# Patient Record
Sex: Female | Born: 1935
Health system: Southern US, Community
[De-identification: ages and names within clinical notes are randomized; demographics above are authoritative.]

## PROBLEM LIST (undated history)

## (undated) DIAGNOSIS — T7840XA Allergy, unspecified, initial encounter: Secondary | ICD-10-CM

## (undated) DIAGNOSIS — G629 Polyneuropathy, unspecified: Secondary | ICD-10-CM

## (undated) DIAGNOSIS — I10 Essential (primary) hypertension: Secondary | ICD-10-CM

## (undated) DIAGNOSIS — J189 Pneumonia, unspecified organism: Secondary | ICD-10-CM

## (undated) DIAGNOSIS — F329 Major depressive disorder, single episode, unspecified: Secondary | ICD-10-CM

## (undated) DIAGNOSIS — F32A Depression, unspecified: Secondary | ICD-10-CM

## (undated) DIAGNOSIS — I341 Nonrheumatic mitral (valve) prolapse: Secondary | ICD-10-CM

## (undated) DIAGNOSIS — K635 Polyp of colon: Secondary | ICD-10-CM

## (undated) DIAGNOSIS — Z8719 Personal history of other diseases of the digestive system: Secondary | ICD-10-CM

## (undated) DIAGNOSIS — M1990 Unspecified osteoarthritis, unspecified site: Secondary | ICD-10-CM

## (undated) DIAGNOSIS — K219 Gastro-esophageal reflux disease without esophagitis: Secondary | ICD-10-CM

## (undated) DIAGNOSIS — K5790 Diverticulosis of intestine, part unspecified, without perforation or abscess without bleeding: Secondary | ICD-10-CM

## (undated) DIAGNOSIS — E079 Disorder of thyroid, unspecified: Secondary | ICD-10-CM

## (undated) DIAGNOSIS — E785 Hyperlipidemia, unspecified: Secondary | ICD-10-CM

## (undated) DIAGNOSIS — M4800 Spinal stenosis, site unspecified: Secondary | ICD-10-CM

## (undated) HISTORY — DX: Depression, unspecified: F32.A

## (undated) HISTORY — DX: Allergy, unspecified, initial encounter: T78.40XA

## (undated) HISTORY — DX: Polyp of colon: K63.5

## (undated) HISTORY — DX: Spinal stenosis, site unspecified: M48.00

## (undated) HISTORY — DX: Hyperlipidemia, unspecified: E78.5

## (undated) HISTORY — PX: COLONOSCOPY: SHX174

## (undated) HISTORY — DX: Personal history of other diseases of the digestive system: Z87.19

## (undated) HISTORY — DX: Major depressive disorder, single episode, unspecified: F32.9

## (undated) HISTORY — PX: WISDOM TOOTH EXTRACTION: SHX21

## (undated) HISTORY — PX: TONSILLECTOMY: SUR1361

## (undated) HISTORY — PX: OTHER SURGICAL HISTORY: SHX169

## (undated) HISTORY — DX: Diverticulosis of intestine, part unspecified, without perforation or abscess without bleeding: K57.90

## (undated) HISTORY — PX: SHOULDER SURGERY: SHX246

## (undated) HISTORY — DX: Unspecified osteoarthritis, unspecified site: M19.90

## (undated) HISTORY — PX: EYE SURGERY: SHX253

## (undated) HISTORY — DX: Nonrheumatic mitral (valve) prolapse: I34.1

## (undated) HISTORY — DX: Disorder of thyroid, unspecified: E07.9

## (undated) HISTORY — PX: CATARACT EXTRACTION: SUR2

## (undated) HISTORY — PX: BACK SURGERY: SHX140

## (undated) HISTORY — DX: Gastro-esophageal reflux disease without esophagitis: K21.9

## (undated) HISTORY — DX: Essential (primary) hypertension: I10

## (undated) HISTORY — PX: TUBAL LIGATION: SHX77

## (undated) HISTORY — PX: ABDOMINAL HYSTERECTOMY: SHX81

---

## 2003-08-13 LAB — HM COLONOSCOPY

## 2009-07-31 LAB — HM MAMMOGRAPHY

## 2010-05-04 ENCOUNTER — Ambulatory Visit: Payer: Self-pay | Admitting: Internal Medicine

## 2010-05-04 DIAGNOSIS — E785 Hyperlipidemia, unspecified: Secondary | ICD-10-CM | POA: Insufficient documentation

## 2010-05-04 DIAGNOSIS — J45909 Unspecified asthma, uncomplicated: Secondary | ICD-10-CM | POA: Insufficient documentation

## 2010-05-04 DIAGNOSIS — I10 Essential (primary) hypertension: Secondary | ICD-10-CM

## 2010-05-04 DIAGNOSIS — J309 Allergic rhinitis, unspecified: Secondary | ICD-10-CM | POA: Insufficient documentation

## 2010-05-04 DIAGNOSIS — K5730 Diverticulosis of large intestine without perforation or abscess without bleeding: Secondary | ICD-10-CM | POA: Insufficient documentation

## 2010-05-04 DIAGNOSIS — Z8679 Personal history of other diseases of the circulatory system: Secondary | ICD-10-CM | POA: Insufficient documentation

## 2010-05-04 DIAGNOSIS — I499 Cardiac arrhythmia, unspecified: Secondary | ICD-10-CM | POA: Insufficient documentation

## 2010-05-04 DIAGNOSIS — M1990 Unspecified osteoarthritis, unspecified site: Secondary | ICD-10-CM

## 2010-05-04 DIAGNOSIS — D126 Benign neoplasm of colon, unspecified: Secondary | ICD-10-CM | POA: Insufficient documentation

## 2010-05-04 DIAGNOSIS — K219 Gastro-esophageal reflux disease without esophagitis: Secondary | ICD-10-CM | POA: Insufficient documentation

## 2010-05-04 DIAGNOSIS — E039 Hypothyroidism, unspecified: Secondary | ICD-10-CM

## 2010-05-04 DIAGNOSIS — Z8719 Personal history of other diseases of the digestive system: Secondary | ICD-10-CM | POA: Insufficient documentation

## 2010-05-04 HISTORY — DX: Personal history of other diseases of the digestive system: Z87.19

## 2010-05-04 HISTORY — DX: Essential (primary) hypertension: I10

## 2010-05-04 HISTORY — DX: Allergic rhinitis, unspecified: J30.9

## 2010-05-04 HISTORY — DX: Gastro-esophageal reflux disease without esophagitis: K21.9

## 2010-05-04 HISTORY — DX: Personal history of other diseases of the circulatory system: Z86.79

## 2010-05-04 HISTORY — DX: Cardiac arrhythmia, unspecified: I49.9

## 2010-05-04 HISTORY — DX: Hyperlipidemia, unspecified: E78.5

## 2010-05-04 HISTORY — DX: Unspecified osteoarthritis, unspecified site: M19.90

## 2010-05-04 HISTORY — DX: Diverticulosis of large intestine without perforation or abscess without bleeding: K57.30

## 2010-05-04 HISTORY — DX: Benign neoplasm of colon, unspecified: D12.6

## 2010-05-04 HISTORY — DX: Hypothyroidism, unspecified: E03.9

## 2010-06-29 NOTE — Assessment & Plan Note (Signed)
Summary: NEW PT EST / OK PER DR Lovell Sheehan // RS   Vital Signs:  Patient profile:   77 year old female Height:      64 inches Weight:      126 pounds BMI:     21.71 Temp:     98.2 degrees F oral Pulse rate:   72 / minute Resp:     14 per minute BP sitting:   140 / 80  (left arm)  Vitals Entered By: Willy Eddy, LPN (May 04, 2010 9:24 AM) CC: new pt to establish, Hypertension Management Is Patient Diabetic? No   Primary Care Provider:  Stacie Glaze MD  CC:  new pt to establish and Hypertension Management.  History of Present Illness: Hx of white coat hypertension keeps blood pressure records at home and the last five days has averaged 128/70 with pulse in the 60"s no chest pain or SOB Hx of GERD without symptoms on prilosec 40 ( was on nexium) Has been on metoprolol hx of  MVP ( symptomatic) but echo and cardiolyte were normal with no significant regurgitation has been stable on BB never did dental prophilaxis  Hypertension History:      She denies headache, chest pain, palpitations, dyspnea with exertion, orthopnea, PND, peripheral edema, visual symptoms, neurologic problems, syncope, and side effects from treatment.  measured blood pressure at home.        Positive major cardiovascular risk factors include female age 11 years old or older, hyperlipidemia, and hypertension.  Negative major cardiovascular risk factors include non-tobacco-user status.     Preventive Screening-Counseling & Management  Alcohol-Tobacco     Smoking Status: never     Tobacco Counseling: not indicated; no tobacco use  Caffeine-Diet-Exercise     Does Patient Exercise: yes      Drug Use:  no.    Problems Prior to Update: 1)  Hypothyroidism  (ICD-244.9) 2)  Cardiac Arrhythmia  (ICD-427.9) 3)  Colonic Polyps  (ICD-211.3) 4)  Hypertension  (ICD-401.9) 5)  Hyperlipidemia  (ICD-272.4) 6)  Osteoarthritis  (ICD-715.90) 7)  Gerd  (ICD-530.81) 8)  Diverticulosis, Colon   (ICD-562.10) 9)  Diverticulitis, Hx of  (ICD-V12.79) 10)  Asthma  (ICD-493.90) 11)  Allergic Rhinitis  (ICD-477.9)  Medications Prior to Update: 1)  None  Current Medications (verified): 1)  Levothyroxine Sodium 25 Mcg Tabs (Levothyroxine Sodium) .Marland Kitchen.. 1 Once Daily 2)  Omeprazole 40 Mg Cpdr (Omeprazole) .Marland Kitchen.. 1 Once Daily 3)  Ventolin Hfa 108 (90 Base) Mcg/act Aers (Albuterol Sulfate) .... As Needed 4)  Metoprolol Tartrate 25 Mg Tabs (Metoprolol Tartrate) .Marland Kitchen.. 1 Three Times A Day 5)  Ra Krill Oil 500 Mg Caps (Krill Oil) .... One By Mouth Two Times A Day  Allergies (verified): 1)  ! 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  Past Medical History: Allergic rhinitis Asthma Diverticulitis, hx of Diverticulosis, colon GERD Osteoarthritis Hyperlipidemia Hypertension Hypothyroidism  Past Surgical History: thrigger thumb 2011 Hysterectomy 2010 for fibroids Tonsillectomy as a child  Family History: Reviewed history and no changes required. Family History Ovarian cancer heart disease Family History of Stroke F  1st degree relative <60  Social History: Reviewed history and no changes required. 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 Smoked Alcohol use-yes Drug use-no Regular exercise-yes Smoking Status:  never Drug Use:  no Does Patient Exercise:  yes  Review of Systems  The patient denies anorexia, fever, weight loss, weight gain, vision loss, decreased hearing, hoarseness, chest  pain, syncope, dyspnea on exertion, peripheral edema, prolonged cough, headaches, hemoptysis, abdominal pain, melena, hematochezia, severe indigestion/heartburn, hematuria, incontinence, genital sores, muscle weakness, suspicious skin lesions, transient blindness, difficulty walking, depression, unusual weight change, abnormal bleeding, enlarged lymph nodes, angioedema, and breast masses.    Physical Exam  General:  alert and well-developed.   Head:  normocephalic and atraumatic.   Eyes:  pupils equal and pupils round.   Ears:  R ear normal and L ear normal.   Nose:  no external deformity and no nasal discharge.   Lungs:  normal respiratory effort and no wheezes.   Heart:  normal rate, regular rhythm, and systolic click.   Abdomen:  soft and non-tender.   Msk:  normal ROM and no joint tenderness.   Neurologic:  alert & oriented X3 and finger-to-nose normal.     Impression & Recommendations:  Problem # 1:  HYPOTHYROIDISM (ICD-244.9)  Her updated medication list for this problem includes:    Levothyroxine Sodium 25 Mcg Tabs (Levothyroxine sodium) .Marland Kitchen... 1 once daily  Orders: TLB-TSH (Thyroid Stimulating Hormone) (84443-TSH)  Problem # 2:  HYPERLIPIDEMIA (ICD-272.4) one diet and fish oil discussion of kril oil as an alternative she is not compliant with fish oil Orders: TLB-Cholesterol, Direct LDL (83721-DIRLDL) TLB-Cholesterol, HDL (83718-HDL) TLB-Cholesterol, Total (82465-CHO) Venipuncture (16109)  10 Yr Risk Heart Disease: Not enough information  Problem # 3:  HYPERTENSION (ICD-401.9) brings a few days work of reading fro control Her updated medication list for this problem includes:    Metoprolol Tartrate 25 Mg Tabs (Metoprolol tartrate) .Marland Kitchen... 1 three times a day  BP today: 140/80  10 Yr Risk Heart Disease: Not enough information  Problem # 4:  GERD (ICD-530.81) stable Her updated medication list for this problem includes:    Omeprazole 40 Mg Cpdr (Omeprazole) .Marland Kitchen...  1 once daily  Problem # 5:  ASTHMA (ICD-493.90)  Her updated medication list for this problem includes:    Ventolin Hfa 108 (90 Base) Mcg/act Aers (Albuterol sulfate) .Marland Kitchen... As needed  Problem # 6:  COLONIC POLYPS (ICD-211.3) colon in 2005   Complete Medication List: 1)  Levothyroxine Sodium 25 Mcg Tabs (Levothyroxine sodium) .Marland Kitchen.. 1 once daily 2)  Omeprazole 40 Mg Cpdr (Omeprazole) .Marland Kitchen.. 1 once daily 3)  Ventolin Hfa 108 (90 Base) Mcg/act Aers (Albuterol sulfate) .... As needed 4)  Metoprolol Tartrate 25 Mg Tabs (Metoprolol tartrate) .Marland Kitchen.. 1 three times a day 5)  Ra Krill Oil 500 Mg Caps (Krill oil) .... One by mouth two times a day  Hypertension Assessment/Plan:      The patient's hypertensive risk group is category B: At least one risk factor (excluding diabetes) with no target organ damage.  Today's blood pressure is 140/80.  Her blood pressure goal is < 140/90.  Patient Instructions: 1)  Please schedule a follow-up appointment in 3-4 months. Prescriptions: RA KRILL OIL 500 MG CAPS (KRILL OIL) one by mouth two times a day  #60 x 2   Entered and Authorized by:   Stacie Glaze MD   Signed by:   Stacie Glaze MD on 05/04/2010   Method used:   Electronically to  [facility] (retail)       [address]       [place], [place]  [address]       Ph: [phone] or [phone]       Fax: [phone]   RxID:   [id number]    Orders Added: 1)  TLB-TSH (Thyroid Stimulating Hormone) [84443-TSH] 2)  TLB-Cholesterol, Direct LDL [83721-DIRLDL] 3)  TLB-Cholesterol, HDL [83718-HDL] 4)  TLB-Cholesterol, Total [82465-CHO] 5)  Venipuncture [64403] 6)  New Patient Level III [47425]   Immunization History:  Tetanus/Td Immunization History:    Tetanus/Td:  historical (03/10/2002)    Tetanus/Td:  td (03/10/2002)  Zostavax History:    Zostavax # 1:  zostavax (02/02/2005)  Influenza Immunization History:    Influenza:   historical (02/23/2010)  Pneumovax Immunization History:    Pneumovax:  historical (03/04/2003)   Immunization History:  Tetanus/Td Immunization History:    Tetanus/Td:  Historical (03/10/2002)  Zostavax History:    Zostavax # 1:  Zostavax (02/02/2005)  Influenza Immunization History:    Influenza:  Historical (02/23/2010)  Pneumovax Immunization History:    Pneumovax:  Historical (03/04/2003)   Preventive Care Screening  Mammogram:    Date:  07/31/2009    Next Due:  07/2010    Results:  normal   Pap Smear:    Date:  10/25/2008    Results:  normal   Colonoscopy:    Date:  08/13/2003    Results:  normal   Last Tetanus Booster:    Date:  03/10/2002    Results:  Td       Preventive Care Screening  Mammogram:    Date:  07/31/2009    Next Due:  07/2010    Results:  normal   Pap Smear:    Date:  10/25/2008    Results:  normal   Colonoscopy:    Date:  08/13/2003    Results:  normal   Last Tetanus Booster:    Date:  03/10/2002    Results:  Td   Appended Document: Orders Update    Clinical Lists Changes  Orders: Added new Service order of Specimen Handling ([id number]) - Signed

## 2010-08-17 ENCOUNTER — Encounter: Payer: Self-pay | Admitting: Internal Medicine

## 2010-08-18 ENCOUNTER — Ambulatory Visit (INDEPENDENT_AMBULATORY_CARE_PROVIDER_SITE_OTHER): Payer: Medicare Other | Admitting: Internal Medicine

## 2010-08-18 ENCOUNTER — Encounter: Payer: Self-pay | Admitting: Internal Medicine

## 2010-08-18 VITALS — BP 140/80 | HR 72 | Temp 97.4°F | Resp 14 | Ht 64.0 in | Wt 128.0 lb

## 2010-08-18 DIAGNOSIS — N959 Unspecified menopausal and perimenopausal disorder: Secondary | ICD-10-CM

## 2010-08-18 DIAGNOSIS — J4540 Moderate persistent asthma, uncomplicated: Secondary | ICD-10-CM | POA: Insufficient documentation

## 2010-08-18 DIAGNOSIS — J45990 Exercise induced bronchospasm: Secondary | ICD-10-CM

## 2010-08-18 DIAGNOSIS — M25551 Pain in right hip: Secondary | ICD-10-CM

## 2010-08-18 DIAGNOSIS — I10 Essential (primary) hypertension: Secondary | ICD-10-CM

## 2010-08-18 DIAGNOSIS — E039 Hypothyroidism, unspecified: Secondary | ICD-10-CM

## 2010-08-18 HISTORY — DX: Unspecified menopausal and perimenopausal disorder: N95.9

## 2010-08-18 HISTORY — DX: Exercise induced bronchospasm: J45.990

## 2010-08-18 MED ORDER — OMEPRAZOLE 40 MG PO CPDR: 40.0000 mg | DELAYED_RELEASE_CAPSULE | Freq: Every day | ORAL | Status: DC

## 2010-08-18 MED ORDER — ESTRADIOL 0.5 MG PO TABS: 0.5000 mg | ORAL_TABLET | Freq: Every day | ORAL | Status: DC

## 2010-08-18 MED ORDER — KRILL OIL 1000 MG PO CAPS: 1.0000 | ORAL_CAPSULE | Freq: Four times a day (QID) | ORAL | Status: AC

## 2010-08-18 NOTE — Assessment & Plan Note (Signed)
She is stable on her current dose of 25 mcg of Synthroid

## 2010-08-18 NOTE — Progress Notes (Signed)
  Subjective:    Patient ID: Sara Medina, female    DOB: 1929/09/10, 77 y.o.   MRN: [id number]  HPI   the patient is a healthy 77 year old white female who presents for followup  Hyperlipidemia hypertension and hypothyroidism she had blood work drawn prior to her visit for her TSH and her lipid panel she has been doing well. She brings a record of her home blood pressures which average 120/70 she has done this daily for several weeks prior to this visit to demonstrate an elevated blood pressures in the office or white coat syndrome not treated hypertension.   She's currently on red kril oill once a day  she is on an estrogen patch having had a hysterectomy 2 years ago 4 menopausal symptoms  Review of Systems  Constitutional: Negative for activity change, appetite change and fatigue.  HENT: Negative for ear pain, congestion, neck pain, postnasal drip and sinus pressure.   Eyes: Negative for redness and visual disturbance.  Respiratory: Negative for cough, shortness of breath and wheezing.   Gastrointestinal: Negative for abdominal pain and abdominal distention.  Genitourinary: Negative for dysuria, frequency and menstrual problem.  Musculoskeletal: Negative for myalgias, joint swelling and arthralgias.  Skin: Negative for rash and wound.  Neurological: Negative for dizziness, weakness and headaches.  Hematological: Negative for adenopathy. Does not bruise/bleed easily.  Psychiatric/Behavioral: Negative for sleep disturbance and decreased concentration.   Past Medical History  Diagnosis Date  . Allergy   . Asthma   . Diverticulosis   . History of diverticulitis of colon   . GERD (gastroesophageal reflux disease)   . Arthritis   . Hyperlipidemia   . Hypertension   . Thyroid disease    Past Surgical History  Procedure Date  . Trigger thumb   . Abdominal hysterectomy   . Tonsillectomy     reports that she has never smoked. She has never used smokeless tobacco. She reports that she  does not drink alcohol or use illicit drugs. family history includes Cancer in her mother and sister; Heart disease in her father; Ovarian cancer in an unspecified family member; and Stroke in her father. Allergies  Allergen Reactions  . Penicillins        Objective:   Physical Exam  Constitutional: She is oriented to person, place, and time. She appears well-developed and well-nourished. No distress.  HENT:  Head: Normocephalic and atraumatic.  Right Ear: External ear normal.  Left Ear: External ear normal.  Nose: Nose normal.  Mouth/Throat: Oropharynx is clear and moist.  Eyes: Conjunctivae and EOM are normal. Pupils are equal, round, and reactive to light.  Neck: Normal range of motion. Neck supple. No JVD present. No tracheal deviation present. No thyromegaly present.  Cardiovascular: Normal rate, regular rhythm, normal heart sounds and intact distal pulses.   No murmur heard. Pulmonary/Chest: Effort normal and breath sounds normal. She has no wheezes. She exhibits no tenderness.  Abdominal: Soft. Bowel sounds are normal.  Musculoskeletal: Normal range of motion. She exhibits no edema and no tenderness.  Lymphadenopathy:    She has no cervical adenopathy.  Neurological: She is alert and oriented to person, place, and time. She has normal reflexes. No cranial nerve deficit.  Skin: Skin is warm and dry. She is not diaphoretic.  Psychiatric: She has a normal mood and affect. Her behavior is normal.          Assessment & Plan:

## 2010-08-18 NOTE — Assessment & Plan Note (Signed)
The patient is currently on Vivelle dot and she is wanting to change to an oval preparation she was placed on HRT following a hysterectomy and when she tried to stop her estrogen supplementation she experienced extreme menopausal symptoms

## 2010-08-18 NOTE — Patient Instructions (Signed)
Go over to [facility] across the street from [facility] is [facility] in the basement of the building is the radiology suite you can present there is any time this week between 8 and 5 PM 4 x-rays of your hip

## 2010-08-18 NOTE — Assessment & Plan Note (Signed)
Patient brings with her a listing of her blood pressures from home and most blood pressures range from a systolic in the 100-120 range to diastolic from the 60-70 range her average blood pressure appears to be approximately 120/70 this confirms that her elevated readings in the office more likely due to anxiety or white coat syndrome and they are to true systolic hypertension

## 2010-08-18 NOTE — Assessment & Plan Note (Signed)
We discussed with the patient that if she should have need for using the albuterol inhaler on a regular basis such as 3 or 4 times a week and we  we would prescribed an asthma controller drug

## 2010-08-19 ENCOUNTER — Other Ambulatory Visit: Payer: Self-pay

## 2010-08-19 DIAGNOSIS — Z1231 Encounter for screening mammogram for malignant neoplasm of breast: Secondary | ICD-10-CM

## 2010-08-20 ENCOUNTER — Ambulatory Visit (INDEPENDENT_AMBULATORY_CARE_PROVIDER_SITE_OTHER)
Admission: RE | Admit: 2010-08-20 | Discharge: 2010-08-20 | Disposition: A | Discharge status: Home / Self Care | Payer: Medicare Other | Source: Ambulatory Visit | Attending: Internal Medicine | Admitting: Internal Medicine

## 2010-08-20 DIAGNOSIS — M25551 Pain in right hip: Secondary | ICD-10-CM

## 2010-08-20 DIAGNOSIS — M25559 Pain in unspecified hip: Secondary | ICD-10-CM

## 2010-08-23 ENCOUNTER — Encounter: Payer: Self-pay | Admitting: Internal Medicine

## 2010-08-25 NOTE — Progress Notes (Signed)
Pt informed

## 2010-08-29 DEATH — deceased

## 2010-09-02 ENCOUNTER — Ambulatory Visit
Admission: RE | Admit: 2010-09-02 | Discharge: 2010-09-02 | Disposition: A | Discharge status: Home / Self Care | Payer: Medicare Other | Source: Ambulatory Visit | Attending: *Deleted | Admitting: *Deleted

## 2010-09-02 DIAGNOSIS — Z1231 Encounter for screening mammogram for malignant neoplasm of breast: Secondary | ICD-10-CM

## 2010-09-22 ENCOUNTER — Emergency Department (HOSPITAL_COMMUNITY): Payer: Medicare Other

## 2010-09-22 ENCOUNTER — Inpatient Hospital Stay (INDEPENDENT_AMBULATORY_CARE_PROVIDER_SITE_OTHER)
Admission: RE | Admit: 2010-09-22 | Discharge: 2010-09-22 | Disposition: A | Discharge status: Home / Self Care | Payer: Medicare Other | Source: Ambulatory Visit | Attending: Family Medicine | Admitting: Family Medicine

## 2010-09-22 ENCOUNTER — Emergency Department (HOSPITAL_COMMUNITY)
Admission: EM | Admit: 2010-09-22 | Discharge: 2010-09-23 | Disposition: A | Discharge status: Home / Self Care | Payer: Medicare Other | Attending: Emergency Medicine | Admitting: Emergency Medicine

## 2010-09-22 DIAGNOSIS — I440 Atrioventricular block, first degree: Secondary | ICD-10-CM | POA: Insufficient documentation

## 2010-09-22 DIAGNOSIS — J45909 Unspecified asthma, uncomplicated: Secondary | ICD-10-CM | POA: Insufficient documentation

## 2010-09-22 DIAGNOSIS — I10 Essential (primary) hypertension: Secondary | ICD-10-CM | POA: Insufficient documentation

## 2010-09-22 DIAGNOSIS — G459 Transient cerebral ischemic attack, unspecified: Secondary | ICD-10-CM

## 2010-09-22 DIAGNOSIS — R42 Dizziness and giddiness: Secondary | ICD-10-CM | POA: Insufficient documentation

## 2010-09-22 LAB — POCT I-STAT, CHEM 8
Creatinine, Ser: 1.1 mg/dL (ref 0.4–1.2)
Glucose, Bld: 123 mg/dL — ABNORMAL HIGH (ref 70–99)
Hemoglobin: 13.3 g/dL (ref 12.0–15.0)
Potassium: 4.5 mEq/L (ref 3.5–5.1)

## 2010-09-23 LAB — CBC
HCT: 37 % (ref 36.0–46.0)
MCH: 33.2 pg (ref 26.0–34.0)
MCHC: 35.4 g/dL (ref 30.0–36.0)
MCV: 93.7 fL (ref 78.0–100.0)
RDW: 12.8 % (ref 11.5–15.5)

## 2010-09-23 LAB — BASIC METABOLIC PANEL
BUN: 13 mg/dL (ref 6–23)
Calcium: 9.6 mg/dL (ref 8.4–10.5)
Creatinine, Ser: 0.84 mg/dL (ref 0.4–1.2)
GFR calc non Af Amer: >60 mL/min (ref >60)
Glucose, Bld: 114 mg/dL — ABNORMAL HIGH (ref 70–99)

## 2010-09-23 LAB — DIFFERENTIAL
Basophils Absolute: 0 10*3/uL (ref 0.0–0.1)
Eosinophils Relative: 2 % (ref 0–5)
Lymphocytes Relative: 21 % (ref 12–46)
Monocytes Absolute: 0.4 10*3/uL (ref 0.1–1.0)
Monocytes Relative: 4 % (ref 3–12)

## 2010-09-27 ENCOUNTER — Ambulatory Visit (INDEPENDENT_AMBULATORY_CARE_PROVIDER_SITE_OTHER): Payer: Medicare Other | Admitting: Internal Medicine

## 2010-09-27 ENCOUNTER — Encounter: Payer: Self-pay | Admitting: Internal Medicine

## 2010-09-27 DIAGNOSIS — R42 Dizziness and giddiness: Secondary | ICD-10-CM | POA: Insufficient documentation

## 2010-09-27 MED ORDER — MECLIZINE HCL 25 MG PO TABS: 25.0000 mg | ORAL_TABLET | Freq: Three times a day (TID) | ORAL | Status: AC | PRN

## 2010-09-27 NOTE — Assessment & Plan Note (Addendum)
Symptoms appear to have resolved. Neurologically nonfocal. Prescription provided for Antivert when necessary and followup if symptoms occur.

## 2010-09-27 NOTE — Progress Notes (Signed)
  Subjective:    Patient ID: Sara Medina, female    DOB: 05/26/1930, 77 y.o.   MRN: [id number]  HPI Pt presents to clinic for evaluation of vertigo. Recent had several episodes of spontaneous vertigo. Initial episode lasted approximately 15 seconds followed by spontaneous resolution. Resume obvious trigger. Second episode occurred a short time later and lasted approximately 15 minutes. Patient lay on the floor at that time and had mild nausea without emesis. Did not have any neurologic deficits and denied change in vision, difficulty with speech, numbness tingling or weakness. No obvious worsening with head turning or postural change. Presented to the emergency department and had a third episode lasting approximately 2 minutes. Reviewed workup included normal CBC Chem-7, chest x-ray, EKG and head CT. Is prescribed Antivert and takes it without sedation. Had no further episodes of dizziness or vertigo. States in retrospect may have had mild right ear pain that preceded the vertigo.  No other alleviating or exacerbating factors. No other complaints.  Reviewed past medical history, medications and allergies.    Review of Systems see history of present illness     Objective:   Physical Exam    Physical Exam  [nursing notereviewed. Constitutional:  appears well-developed and well-nourished. No distress.  HENT:  Head: Normocephalic and atraumatic.  Right Ear: Tympanic membrane, external ear and ear canal normal.  Left Ear: Tympanic membrane, external ear and ear canal normal.  Nose: Nose normal.  Mouth/Throat: Oropharynx is clear and moist. No oropharyngeal exudate.  Eyes: Conjunctivae are normal. No scleral icterus.  Neck: Neck supple.  Cardiovascular: Normal rate, regular rhythm and normal heart sounds.  Exam reveals no gallop and no friction rub.   No murmur heard. Pulmonary/Chest: Effort normal and breath sounds normal. No respiratory distress.  no wheezes.  no rales.  Lymphadenopathy:   no cervical adenopathy.  Neurological:  Alert. Cranial nerves II through XII grossly intact. Finger to nose intact bilaterally without evidence of hand dysmetria.  Skin: Skin is warm and dry.  not diaphoretic.      Assessment & Plan:

## 2011-02-18 ENCOUNTER — Ambulatory Visit (INDEPENDENT_AMBULATORY_CARE_PROVIDER_SITE_OTHER): Payer: Medicare Other | Admitting: Internal Medicine

## 2011-02-18 ENCOUNTER — Encounter: Payer: Self-pay | Admitting: Internal Medicine

## 2011-02-18 VITALS — BP 144/80 | HR 72 | Temp 98.1°F | Resp 16 | Ht 64.0 in | Wt 125.0 lb

## 2011-02-18 DIAGNOSIS — E039 Hypothyroidism, unspecified: Secondary | ICD-10-CM

## 2011-02-18 DIAGNOSIS — K219 Gastro-esophageal reflux disease without esophagitis: Secondary | ICD-10-CM

## 2011-02-18 DIAGNOSIS — I10 Essential (primary) hypertension: Secondary | ICD-10-CM

## 2011-02-18 DIAGNOSIS — E785 Hyperlipidemia, unspecified: Secondary | ICD-10-CM

## 2011-02-18 DIAGNOSIS — M1990 Unspecified osteoarthritis, unspecified site: Secondary | ICD-10-CM

## 2011-02-18 DIAGNOSIS — J45909 Unspecified asthma, uncomplicated: Secondary | ICD-10-CM

## 2011-02-18 DIAGNOSIS — Z23 Encounter for immunization: Secondary | ICD-10-CM

## 2011-02-18 DIAGNOSIS — Z0000 Encounter for general adult medical examination without abnormal findings: Secondary | ICD-10-CM

## 2011-02-18 LAB — POCT URINALYSIS DIPSTICK
Bilirubin, UA: NEGATIVE
Blood, UA: NEGATIVE
Glucose, UA: NEGATIVE
Ketones, UA: NEGATIVE
Leukocytes, UA: NEGATIVE
pH, UA: 5.5

## 2011-02-18 LAB — BASIC METABOLIC PANEL
CO2: 25 mEq/L (ref 19–32)
Calcium: 9.1 mg/dL (ref 8.4–10.5)
Creatinine, Ser: 0.8 mg/dL (ref 0.4–1.2)
Glucose, Bld: 84 mg/dL (ref 70–99)

## 2011-02-18 LAB — HEPATIC FUNCTION PANEL
ALT: 17 U/L (ref 0–35)
AST: 22 U/L (ref 0–37)
Total Protein: 6.8 g/dL (ref 6.0–8.3)

## 2011-02-18 LAB — LIPID PANEL
Cholesterol: 216 mg/dL — ABNORMAL HIGH (ref 0–200)
Triglycerides: 110 mg/dL (ref 0.0–149.0)

## 2011-02-18 LAB — LDL CHOLESTEROL, DIRECT: Direct LDL: 136.9 mg/dL

## 2011-02-18 NOTE — Progress Notes (Signed)
Subjective:    Sara Medina is a 77 y.o. female who presents for Medicare Annual/Subsequent preventive examination. Patient is followed for mild depression for which he takes [facility] route she is also followed for hypertension with whitecoat syndrome she brings a record of her blood pressure readings for the past month which have been in the normal range [name] has a history of intermittent palpitations which have not been diagnosed as paroxysmal atrial fibrillation that have responded to the initiation of beta blocker therapy.  She generally has been doing well her asthma has been stable she infrequently uses a rescue inhaler but the frequency of that use has not increased.  She is up-to-date with all health maintenance she sees a cardiologist for her hearing and no other physicians at this time  Preventive Screening-Counseling & Management  Tobacco History  Smoking status  . Never Smoker   Smokeless tobacco  . Never Used     Problems Prior to Visit 1.   Current Problems (verified) Patient Active Problem List  Diagnoses  . COLONIC POLYPS  . HYPOTHYROIDISM  . HYPERLIPIDEMIA  . HYPERTENSION  . CARDIAC ARRHYTHMIA  . ALLERGIC RHINITIS  . ASTHMA  . GERD  . DIVERTICULOSIS, COLON  . OSTEOARTHRITIS  . MITRAL VALVE PROLAPSE, HX OF  . DIVERTICULITIS, HX OF  . Asthma, exercise induced  . Menopausal disorder  . Vertigo    Medications Prior to Visit Current Outpatient Prescriptions on File Prior to Visit  Medication Sig Dispense Refill  . albuterol (PROVENTIL HFA) 108 (90 BASE) MCG/ACT inhaler Inhale 2 puffs into the lungs every 6 (six) hours as needed.        Marland Kitchen aspirin 81 MG tablet Take 81 mg by mouth daily.        . calcium gluconate 500 MG tablet Take 500 mg by mouth daily.        Marland Kitchen estradiol (ESTRACE) 0.5 MG tablet Take 1 tablet (0.5 mg total) by mouth daily.  30 tablet  11  . levothyroxine (SYNTHROID, LEVOTHROID) 25 MCG tablet Take 25 mcg by mouth daily.        .  metoprolol tartrate (LOPRESSOR) 25 MG tablet Take 25 mg by mouth 2 (two) times daily.         Current Medications (verified) Current Outpatient Prescriptions  Medication Sig Dispense Refill  . albuterol (PROVENTIL HFA) 108 (90 BASE) MCG/ACT inhaler Inhale 2 puffs into the lungs every 6 (six) hours as needed.        Marland Kitchen aspirin 81 MG tablet Take 81 mg by mouth daily.        . B Complex-C-Folic Acid (MULTIVITAMIN, STRESS FORMULA) tablet Take 1 tablet by mouth daily.        . calcium gluconate 500 MG tablet Take 500 mg by mouth daily.        Marland Kitchen estradiol (ESTRACE) 0.5 MG tablet Take 1 tablet (0.5 mg total) by mouth daily.  30 tablet  11  . folic acid (FOLVITE) 400 MCG tablet Take 400 mcg by mouth daily.        Marland Kitchen glucosamine-chondroitin 500-400 MG tablet Take 1 tablet by mouth daily.        Marland Kitchen KRILL OIL 1000 MG CAPS Take 3,000 mg by mouth daily.        Marland Kitchen levothyroxine (SYNTHROID, LEVOTHROID) 25 MCG tablet Take 25 mcg by mouth daily.        . metoprolol tartrate (LOPRESSOR) 25 MG tablet Take 25 mg by mouth 2 (two) times daily.       Marland Kitchen  omeprazole (PRILOSEC) 40 MG capsule Take 40 mg by mouth daily.        . Thiamine HCl (VITAMIN B-1) 100 MG tablet Take 100 mg by mouth daily.           Allergies (verified) Penicillins   PAST HISTORY  Family History Family History  Problem Relation Age of Onset  . Ovarian cancer    . Cancer Mother     ovarian  . Heart disease Father   . Stroke Father   . Cancer Sister     brain    Social History History  Substance Use Topics  . Smoking status: Never Smoker   . Smokeless tobacco: Never Used  . Alcohol Use: No     Are there smokers in your home (other than you)? No  Risk Factors Current exercise habits: Gym/ health club routine includes cardio and yoga.  Dietary issues discussed: not indicated   Cardiac risk factors: advanced age (older than 87 for men, 22 for women).  Depression Screen (Note: if answer to either of the following is "Yes", a  more complete depression screening is indicated)   Over the past two weeks, have you felt down, depressed or hopeless? No  Over the past two weeks, have you felt little interest or pleasure in doing things? No  Have you lost interest or pleasure in daily life? Yes  Do you often feel hopeless? No  Do you cry easily over simple problems? No  Activities of Daily Living In your present state of health, do you have any difficulty performing the following activities?:  Driving? No Managing money?  No Feeding yourself? No Getting from bed to chair? No Climbing a flight of stairs? No Preparing food and eating?: No Bathing or showering? No Getting dressed: No Getting to the toilet? No Using the toilet:No Moving around from place to place: No In the past year have you fallen or had a near fall?:No   Are you sexually active?  No  Do you have more than one partner?  No  Hearing Difficulties: Yes Do you often ask people to speak up or repeat themselves? Yes Do you experience ringing or noises in your ears? Yes Do you have difficulty understanding soft or whispered voices? Yes   Do you feel that you have a problem with memory? Yes  Do you often misplace items? No  Do you feel safe at home?  No  Cognitive Testing  Alert? Yes  Normal Appearance?Yes  Oriented to person? Yes  Place? Yes   Time? Yes  Recall of three objects?  Yes  Can perform simple calculations? Yes  Displays appropriate judgment?Yes  Can read the correct time from a watch face?Yes   Advanced Directives have been discussed with the patient? Yes  List the Names of Other Physician/Practitioners you currently use: 1.    Indicate any recent Medical Services you may have received from other than [facility] providers in the past year (date may be approximate).  Immunization History  Administered Date(s) Administered  . Influenza Split 02/18/2011  . Influenza Whole 02/23/2010  . Pneumococcal Polysaccharide 03/04/2003  . Td  03/10/2002  . Zoster 02/02/2005    Screening Tests Health Maintenance  Topic Date Due  . Influenza Vaccine  02/28/2011  . Tetanus/tdap  03/10/2012  . Colonoscopy  08/12/2013  . Pneumococcal Polysaccharide Vaccine Age 5 And Over  Completed  . Zostavax  Completed    All answers were reviewed with the patient and necessary referrals were made:  Carrie Mew   02/18/2011   History reviewed: allergies, current medications, past family history, past medical history, past social history, past surgical history and problem list  Review of Systems Behavioral/Psych: positive for anxiety, loss of interest in favorite activities and mood swings    Objective:     Vision by Snellen chart: right eye:20/20, left eye:20/20 corrected  Body mass index is 21.46 kg/(m^2). BP 144/80  Pulse 72  Temp 98.1 F (36.7 C)  Resp 16  Ht 5\' 4"  (1.626 m)  Wt 125 lb (56.7 kg)  BMI 21.46 kg/m2  BP 144/80  Pulse 72  Temp 98.1 F (36.7 C)  Resp 16  Ht 5\' 4"  (1.626 m)  Wt 125 lb (56.7 kg)  BMI 21.46 kg/m2  General Appearance:    Alert, cooperative, no distress, appears stated age  Head:    Normocephalic, without obvious abnormality, atraumatic  Eyes:    PERRL, conjunctiva/corneas clear, EOM's intact, fundi    benign, both eyes  Ears:    Normal TM's and external ear canals, both ears  Nose:   Nares normal, septum midline, mucosa normal, no drainage    or sinus tenderness  Throat:   Lips, mucosa, and tongue normal; teeth and gums normal  Neck:   Supple, symmetrical, trachea midline, no adenopathy;    thyroid:  no enlargement/tenderness/nodules; no carotid   bruit or JVD  Back:     Symmetric, no curvature, ROM normal, no CVA tenderness  Lungs:     Clear to auscultation bilaterally, respirations unlabored  Chest Wall:    No tenderness or deformity   Heart:    Regular rate and rhythm, S1 and S2 normal, no murmur, rub   or gallop  Breast Exam:    No tenderness, masses, or nipple abnormality    Abdomen:     Soft, non-tender, bowel sounds active all four quadrants,    no masses, no organomegaly  Genitalia:    Normal female without lesion, discharge or tenderness  Rectal:    Normal tone, normal prostate, no masses or tenderness;   guaiac negative stool  Extremities:   Extremities normal, atraumatic, no cyanosis or edema  Pulses:   2+ and symmetric all extremities  Skin:   Skin color, texture, turgor normal, no rashes or lesions  Lymph nodes:   Cervical, supraclavicular, and axillary nodes normal  Neurologic:   CNII-XII intact, normal strength, sensation and reflexes    throughout       Assessment:  Her depression is controlled with [facility] wort her blood pressure is stable on current medication she has no palpitations at this time her asthma is stable on her current medications.      This is a routine physical examination for this healthy  Female. Reviewed all health maintenance protocols including mammography colonoscopy bone density and reviewed appropriate screening labs. Her immunization history was reviewed as well as her current medications and allergies refills of her chronic medications were given and the plan for yearly health maintenance was discussed all orders and referrals were made as appropriate.      Plan:     During the course of the visit the patient was educated and counseled about appropriate screening and preventive services including:    follow up with hearing aids and testing  Diet review for nutrition referral? Yes ____  Not Indicated ___x_   Patient Instructions (the written plan) was given to the patient.  Medicare Attestation I have personally reviewed: The patient's medical and social  history Their use of alcohol, tobacco or illicit drugs Their current medications and supplements The patient's functional ability including ADLs,fall risks, home safety risks, cognitive, and hearing and visual impairment Diet and physical  activities Evidence for depression or mood disorders  The patient's weight, height, BMI, and visual acuity have been recorded in the chart.  I have made referrals, counseling, and provided education to the patient based on review of the above and I have provided the patient with a written personalized care plan for preventive services.     Carrie Mew   02/18/2011

## 2011-02-18 NOTE — Assessment & Plan Note (Signed)
monitoring the TSH

## 2011-02-18 NOTE — Progress Notes (Signed)
Addended by: Stacie Glaze MD E on: 02/18/2011 09:13 AM   Modules accepted: Orders

## 2011-02-18 NOTE — Assessment & Plan Note (Signed)
The blood pressure reading that she provided are in range and she has not noted increased blood pressure or palpitations

## 2011-02-18 NOTE — Assessment & Plan Note (Signed)
Has been able to reduce the PPI to three times a week

## 2011-02-18 NOTE — Assessment & Plan Note (Signed)
Chronic cough and PNdrip Asthma stable Occasional use of inhaler not increased

## 2011-02-18 NOTE — Assessment & Plan Note (Signed)
Multiple joint involvement Knees primary

## 2011-04-08 ENCOUNTER — Other Ambulatory Visit: Payer: Self-pay | Admitting: *Deleted

## 2011-04-08 MED ORDER — LEVOTHYROXINE SODIUM 25 MCG PO TABS: 25.0000 ug | ORAL_TABLET | Freq: Every day | ORAL | Status: DC

## 2011-05-31 ENCOUNTER — Other Ambulatory Visit: Payer: Self-pay | Admitting: Internal Medicine

## 2011-06-08 DIAGNOSIS — I4891 Unspecified atrial fibrillation: Secondary | ICD-10-CM | POA: Diagnosis not present

## 2011-06-21 ENCOUNTER — Encounter: Payer: Self-pay | Admitting: Internal Medicine

## 2011-06-21 ENCOUNTER — Ambulatory Visit (INDEPENDENT_AMBULATORY_CARE_PROVIDER_SITE_OTHER): Payer: Medicare Other | Admitting: Internal Medicine

## 2011-06-21 DIAGNOSIS — L272 Dermatitis due to ingested food: Secondary | ICD-10-CM

## 2011-06-21 DIAGNOSIS — E039 Hypothyroidism, unspecified: Secondary | ICD-10-CM | POA: Diagnosis not present

## 2011-06-21 DIAGNOSIS — Z91018 Allergy to other foods: Secondary | ICD-10-CM

## 2011-06-21 DIAGNOSIS — I10 Essential (primary) hypertension: Secondary | ICD-10-CM | POA: Diagnosis not present

## 2011-06-21 DIAGNOSIS — E785 Hyperlipidemia, unspecified: Secondary | ICD-10-CM | POA: Diagnosis not present

## 2011-06-21 DIAGNOSIS — M1990 Unspecified osteoarthritis, unspecified site: Secondary | ICD-10-CM

## 2011-06-21 MED ORDER — EPINEPHRINE 0.15 MG/0.3ML IJ DEVI: 0.1500 mg | INTRAMUSCULAR | Status: DC | PRN

## 2011-06-21 MED ORDER — DICLOFENAC SODIUM 1.5 % TD SOLN: 10.0000 [drp] | Freq: Four times a day (QID) | TRANSDERMAL | Status: DC | PRN

## 2011-06-21 NOTE — Patient Instructions (Signed)
Monitor your blood pressure over the next couple of days if her blood pressure remains elevated greater than 130 then we would consider changing the metoprolol 25 mg twice a day to a combination daily medicine called ziac Ziac a combination of a beta blocker and a tiny amount of a diuretic they work synergistically to lower your blood pressure   Call back in one week if your blood pressures remain elevated we will change you to do new blood pressure medication

## 2011-06-21 NOTE — Progress Notes (Signed)
Addended by: Stacie Glaze on: 06/21/2011 09:47 AM   Modules accepted: Orders

## 2011-06-21 NOTE — Progress Notes (Signed)
Subjective:    Patient ID: Sara Medina, female    DOB: 1929-07-21, 78 y.o.   MRN: [id number]  HPI Generally the patient's blood pressure has been excellent and she brings a record over the past several weeks most blood pressure readings have been less than 120 over less than 70 however over the last few days her blood pressure has been elevated and her pulse has been stable in the 60s. She does not recall any dietary indiscretion with increased sodium or any medication errors.  She has not had to use her rescue inhaler for asthma Her only new medication is B12 Her thyroid medication has been stable     Review of Systems  Constitutional: Negative for activity change, appetite change and fatigue.  HENT: Negative for ear pain, congestion, neck pain, postnasal drip and sinus pressure.   Eyes: Negative for redness and visual disturbance.  Respiratory: Negative for cough, shortness of breath and wheezing.   Gastrointestinal: Negative for abdominal pain and abdominal distention.  Genitourinary: Negative for dysuria, frequency and menstrual problem.  Musculoskeletal: Negative for myalgias, joint swelling and arthralgias.  Skin: Negative for rash and wound.  Neurological: Negative for dizziness, weakness and headaches.  Hematological: Negative for adenopathy. Does not bruise/bleed easily.  Psychiatric/Behavioral: Negative for sleep disturbance and decreased concentration.   Past Medical History  Diagnosis Date  . Allergy   . Asthma   . Diverticulosis   . History of diverticulitis of colon   . GERD (gastroesophageal reflux disease)   . Arthritis   . Hyperlipidemia   . Hypertension   . Thyroid disease     History   Social History  . Marital Status: Widowed    Spouse Name: N/A    Number of Children: N/A  . Years of Education: N/A   Occupational History  . retired    Social History Main Topics  . Smoking status: Never Smoker   . Smokeless tobacco: Never Used  . Alcohol Use: No   . Drug Use: No  . Sexually Active: Not Currently   Other Topics Concern  . Not on file   Social History Narrative  . No narrative on file    Past Surgical History  Procedure Date  . Trigger thumb   . Abdominal hysterectomy   . Tonsillectomy     Family History  Problem Relation Age of Onset  . Ovarian cancer    . Cancer Mother     ovarian  . Heart disease Father   . Stroke Father   . Cancer Sister     brain    Allergies  Allergen Reactions  . Penicillins     Current Outpatient Prescriptions on File Prior to Visit  Medication Sig Dispense Refill  . aspirin 81 MG tablet Take 81 mg by mouth daily.        . B Complex-C-Folic Acid (MULTIVITAMIN, STRESS FORMULA) tablet Take 1 tablet by mouth daily.        . calcium gluconate 500 MG tablet Take 500 mg by mouth daily.        Marland Kitchen estradiol (ESTRACE) 0.5 MG tablet TAKE 1 TABLET BY MOUTH DAILY.  90 tablet  10  . folic acid (FOLVITE) 400 MCG tablet Take 400 mcg by mouth daily.        Marland Kitchen glucosamine-chondroitin 500-400 MG tablet Take 3 tablets by mouth daily.       Marland Kitchen KRILL OIL 1000 MG CAPS Take 3,000 mg by mouth daily.        Marland Kitchen  levothyroxine (SYNTHROID, LEVOTHROID) 25 MCG tablet Take 1 tablet (25 mcg total) by mouth daily.  90 tablet  3  . metoprolol tartrate (LOPRESSOR) 25 MG tablet Take 25 mg by mouth 2 (two) times daily.       Marland Kitchen omeprazole (PRILOSEC) 40 MG capsule Take 40 mg by mouth daily as needed.       . Thiamine HCl (VITAMIN B-1) 100 MG tablet Take 100 mg by mouth daily.          BP 160/80  Pulse 64  Temp 98.2 F (36.8 C)  Resp 16  Ht 5\' 4"  (1.626 m)  Wt 126 lb (57.153 kg)  BMI 21.63 kg/m2       Objective:   Physical Exam  Nursing note and vitals reviewed. Constitutional: She is oriented to person, place, and time. She appears well-developed and well-nourished. No distress.  HENT:  Head: Normocephalic and atraumatic.  Right Ear: External ear normal.  Left Ear: External ear normal.  Nose: Nose normal.    Mouth/Throat: Oropharynx is clear and moist.  Eyes: Conjunctivae and EOM are normal. Pupils are equal, round, and reactive to light.  Neck: Normal range of motion. Neck supple. No JVD present. No tracheal deviation present. No thyromegaly present.  Cardiovascular: Normal rate, regular rhythm, normal heart sounds and intact distal pulses.   No murmur heard. Pulmonary/Chest: Effort normal and breath sounds normal. She has no wheezes. She exhibits no tenderness.  Abdominal: Soft. Bowel sounds are normal.  Musculoskeletal: Normal range of motion. She exhibits no edema and no tenderness.  Lymphadenopathy:    She has no cervical adenopathy.  Neurological: She is alert and oriented to person, place, and time. She has normal reflexes. No cranial nerve deficit.  Skin: Skin is warm and dry. She is not diaphoretic.  Psychiatric: She has a normal mood and affect. Her behavior is normal.          Assessment & Plan:  Patient has been given instructions about monitoring her blood pressure with the plan is to change her blood pressure medicine if the blood pressure remains elevated.  The rest of the patient's problem list remains stable specifically her hypothyroidism is stable her current medications for cholesterol stable and her asthma is quiet.

## 2011-06-28 ENCOUNTER — Other Ambulatory Visit: Payer: Self-pay | Admitting: *Deleted

## 2011-06-28 ENCOUNTER — Telehealth: Payer: Self-pay | Admitting: *Deleted

## 2011-06-28 MED ORDER — METOPROLOL TARTRATE 25 MG PO TABS: 25.0000 mg | ORAL_TABLET | Freq: Two times a day (BID) | ORAL | Status: DC

## 2011-06-28 NOTE — Telephone Encounter (Signed)
Pt informed and will refill meds

## 2011-06-28 NOTE — Telephone Encounter (Signed)
Pt calls and states bp was elevated at ov last week and she was to monitor it- it has been running around 125/60 to 140/60-ok to refill bp med?

## 2011-06-28 NOTE — Telephone Encounter (Signed)
Per dr Lovell Sheehan- ok to reorder meds- bp looks good

## 2011-07-14 DIAGNOSIS — I781 Nevus, non-neoplastic: Secondary | ICD-10-CM | POA: Diagnosis not present

## 2011-07-14 DIAGNOSIS — L608 Other nail disorders: Secondary | ICD-10-CM | POA: Diagnosis not present

## 2011-07-14 DIAGNOSIS — D485 Neoplasm of uncertain behavior of skin: Secondary | ICD-10-CM | POA: Diagnosis not present

## 2011-07-14 DIAGNOSIS — L821 Other seborrheic keratosis: Secondary | ICD-10-CM | POA: Diagnosis not present

## 2011-07-14 DIAGNOSIS — D239 Other benign neoplasm of skin, unspecified: Secondary | ICD-10-CM | POA: Diagnosis not present

## 2011-08-11 ENCOUNTER — Other Ambulatory Visit: Payer: Self-pay | Admitting: Internal Medicine

## 2011-08-11 DIAGNOSIS — Z1231 Encounter for screening mammogram for malignant neoplasm of breast: Secondary | ICD-10-CM

## 2011-09-04 ENCOUNTER — Other Ambulatory Visit: Payer: Self-pay | Admitting: Internal Medicine

## 2011-09-09 DIAGNOSIS — H26499 Other secondary cataract, unspecified eye: Secondary | ICD-10-CM | POA: Diagnosis not present

## 2011-09-09 DIAGNOSIS — H04129 Dry eye syndrome of unspecified lacrimal gland: Secondary | ICD-10-CM | POA: Diagnosis not present

## 2011-09-13 ENCOUNTER — Ambulatory Visit
Admission: RE | Admit: 2011-09-13 | Discharge: 2011-09-13 | Disposition: A | Discharge status: Home / Self Care | Payer: Medicare Other | Source: Ambulatory Visit | Attending: Internal Medicine | Admitting: Internal Medicine

## 2011-09-13 DIAGNOSIS — Z1231 Encounter for screening mammogram for malignant neoplasm of breast: Secondary | ICD-10-CM | POA: Diagnosis not present

## 2011-09-15 ENCOUNTER — Other Ambulatory Visit: Payer: Self-pay | Admitting: Internal Medicine

## 2011-09-15 DIAGNOSIS — R928 Other abnormal and inconclusive findings on diagnostic imaging of breast: Secondary | ICD-10-CM

## 2011-09-16 ENCOUNTER — Ambulatory Visit
Admission: RE | Admit: 2011-09-16 | Discharge: 2011-09-16 | Disposition: A | Discharge status: Home / Self Care | Payer: Medicare Other | Source: Ambulatory Visit | Attending: Internal Medicine | Admitting: Internal Medicine

## 2011-09-16 DIAGNOSIS — R928 Other abnormal and inconclusive findings on diagnostic imaging of breast: Secondary | ICD-10-CM | POA: Diagnosis not present

## 2011-09-16 DIAGNOSIS — N6019 Diffuse cystic mastopathy of unspecified breast: Secondary | ICD-10-CM | POA: Diagnosis not present

## 2012-02-27 ENCOUNTER — Encounter: Payer: Self-pay | Admitting: Internal Medicine

## 2012-02-27 ENCOUNTER — Ambulatory Visit (INDEPENDENT_AMBULATORY_CARE_PROVIDER_SITE_OTHER): Payer: Medicare Other | Admitting: Internal Medicine

## 2012-02-27 VITALS — BP 136/80 | HR 76 | Temp 97.8°F | Resp 16 | Ht 64.0 in | Wt 125.0 lb

## 2012-02-27 DIAGNOSIS — R252 Cramp and spasm: Secondary | ICD-10-CM

## 2012-02-27 DIAGNOSIS — E538 Deficiency of other specified B group vitamins: Secondary | ICD-10-CM | POA: Diagnosis not present

## 2012-02-27 DIAGNOSIS — K219 Gastro-esophageal reflux disease without esophagitis: Secondary | ICD-10-CM

## 2012-02-27 DIAGNOSIS — I10 Essential (primary) hypertension: Secondary | ICD-10-CM

## 2012-02-27 DIAGNOSIS — E039 Hypothyroidism, unspecified: Secondary | ICD-10-CM

## 2012-02-27 DIAGNOSIS — Z8679 Personal history of other diseases of the circulatory system: Secondary | ICD-10-CM | POA: Diagnosis not present

## 2012-02-27 DIAGNOSIS — Z23 Encounter for immunization: Secondary | ICD-10-CM

## 2012-02-27 DIAGNOSIS — Z0000 Encounter for general adult medical examination without abnormal findings: Secondary | ICD-10-CM | POA: Diagnosis not present

## 2012-02-27 DIAGNOSIS — E785 Hyperlipidemia, unspecified: Secondary | ICD-10-CM | POA: Diagnosis not present

## 2012-02-27 LAB — LIPID PANEL
Cholesterol: 215 mg/dL — ABNORMAL HIGH (ref 0–200)
Total CHOL/HDL Ratio: 4
Triglycerides: 153 mg/dL — ABNORMAL HIGH (ref 0.0–149.0)

## 2012-02-27 LAB — BASIC METABOLIC PANEL
BUN: 16 mg/dL (ref 6–23)
Calcium: 8.8 mg/dL (ref 8.4–10.5)
Chloride: 104 mEq/L (ref 96–112)
Creatinine, Ser: 0.8 mg/dL (ref 0.4–1.2)
GFR: 74.41 mL/min (ref >60.00)

## 2012-02-27 LAB — HEPATIC FUNCTION PANEL
ALT: 20 U/L (ref 0–35)
Total Bilirubin: 0.4 mg/dL (ref 0.3–1.2)

## 2012-02-27 LAB — CBC WITH DIFFERENTIAL/PLATELET
Basophils Absolute: 0 10*3/uL (ref 0.0–0.1)
HCT: 40.4 % (ref 36.0–46.0)
Lymphs Abs: 1.5 10*3/uL (ref 0.7–4.0)
MCV: 96.6 fl (ref 78.0–100.0)
Monocytes Absolute: 0.5 10*3/uL (ref 0.1–1.0)
Monocytes Relative: 5.8 % (ref 3.0–12.0)
Platelets: 302 10*3/uL (ref 150.0–400.0)
RDW: 13.1 % (ref 11.5–14.6)

## 2012-02-27 LAB — TSH: TSH: 2.47 u[IU]/mL (ref 0.35–5.50)

## 2012-02-27 NOTE — Patient Instructions (Signed)
Consider following a gluten-free diet look over the handout that I had given you to see if he can follow this diet

## 2012-02-27 NOTE — Progress Notes (Signed)
Subjective:    Patient ID: Sara Medina, female    DOB: 1929/08/04, 78 y.o.   MRN: [id number]  HPI Pt had a back spasm in April that lasted for 4 days and she has stated magnesium and this has improved her leg cramps and back spasm....  Magnesium is 250 Pt has IBS diarrhea prone and has been on b12 replacement   Review of Systems  HENT: Positive for congestion, rhinorrhea and postnasal drip.   Eyes: Negative.   Respiratory: Negative.   Musculoskeletal: Positive for myalgias, back pain and joint swelling.       Muscle spasms and leg spasm  Neurological: Positive for numbness.       Objective:   Physical Exam  Nursing note and vitals reviewed. Constitutional: She is oriented to person, place, and time. She appears well-developed and well-nourished.  HENT:  Head: Normocephalic and atraumatic.  Eyes: Conjunctivae normal are normal. Pupils are equal, round, and reactive to light.  Neck: Normal range of motion. Neck supple.  Pulmonary/Chest: Effort normal and breath sounds normal.  Abdominal: Soft. Bowel sounds are normal. She exhibits distension.  Musculoskeletal: Normal range of motion.  Neurological: She is alert and oriented to person, place, and time. Coordination abnormal.       neuropathy          Assessment & Plan:  Discussion of gluten free diet to treat diarrhea prone IBS Measure b 12 and magnesium due to supplimtations On HRT History of hypothyroidism on low-dose Synthroid History GERD on Prilosec History B12 deficiency with GI tract abnormality on B12 orally increased to twice a day Subjective:    [name] is a 78 y.o. female who presents for Medicare Annual/Subsequent preventive examination.  Preventive Screening-Counseling & Management  Tobacco History  Smoking status  . Never Smoker   Smokeless tobacco  . Never Used     Problems Prior to Visit 1.   Current Problems (verified) Patient Active Problem List  Diagnosis  . COLONIC POLYPS  .  HYPOTHYROIDISM  . HYPERLIPIDEMIA  . HYPERTENSION  . CARDIAC ARRHYTHMIA  . ALLERGIC RHINITIS  . ASTHMA  . GERD  . DIVERTICULOSIS, COLON  . OSTEOARTHRITIS  . MITRAL VALVE PROLAPSE, HX OF  . DIVERTICULITIS, HX OF  . Asthma, exercise induced  . Menopausal disorder    Medications Prior to Visit Current Outpatient Prescriptions on File Prior to Visit  Medication Sig Dispense Refill  . albuterol (PROVENTIL HFA;VENTOLIN HFA) 108 (90 BASE) MCG/ACT inhaler Inhale 2 puffs into the lungs every 6 (six) hours as needed.      Marland Kitchen aspirin 81 MG tablet Take 81 mg by mouth daily.        . B Complex-C-Folic Acid (MULTIVITAMIN, STRESS FORMULA) tablet Take 1 tablet by mouth daily.        . calcium gluconate 500 MG tablet Take 500 mg by mouth daily.        Marland Kitchen EPINEPHrine (EPIPEN JR) 0.15 MG/0.3ML injection Inject 0.3 mLs (0.15 mg total) into the muscle as needed.  1 each  2  . estradiol (ESTRACE) 0.5 MG tablet TAKE 1 TABLET BY MOUTH DAILY.  90 tablet  10  . folic acid (FOLVITE) 400 MCG tablet Take 400 mcg by mouth daily.        Marland Kitchen glucosamine-chondroitin 500-400 MG tablet Take 3 tablets by mouth daily.       Marland Kitchen KRILL OIL 1000 MG CAPS Take 3,000 mg by mouth daily.        Marland Kitchen levothyroxine (SYNTHROID,  LEVOTHROID) 25 MCG tablet Take 1 tablet (25 mcg total) by mouth daily.  90 tablet  3  . metoprolol tartrate (LOPRESSOR) 25 MG tablet Take 1 tablet (25 mg total) by mouth 2 (two) times daily.  180 tablet  3  . vitamin B-12 (CYANOCOBALAMIN) 500 MCG tablet Take 1,000 mcg by mouth 2 (two) times daily.         Current Medications (verified) Current Outpatient Prescriptions  Medication Sig Dispense Refill  . albuterol (PROVENTIL HFA;VENTOLIN HFA) 108 (90 BASE) MCG/ACT inhaler Inhale 2 puffs into the lungs every 6 (six) hours as needed.      Marland Kitchen aspirin 81 MG tablet Take 81 mg by mouth daily.        . B Complex-C-Folic Acid (MULTIVITAMIN, STRESS FORMULA) tablet Take 1 tablet by mouth daily.        . calcium gluconate 500  MG tablet Take 500 mg by mouth daily.        Marland Kitchen EPINEPHrine (EPIPEN JR) 0.15 MG/0.3ML injection Inject 0.3 mLs (0.15 mg total) into the muscle as needed.  1 each  2  . estradiol (ESTRACE) 0.5 MG tablet TAKE 1 TABLET BY MOUTH DAILY.  90 tablet  10  . folic acid (FOLVITE) 400 MCG tablet Take 400 mcg by mouth daily.        Marland Kitchen glucosamine-chondroitin 500-400 MG tablet Take 3 tablets by mouth daily.       Marland Kitchen KRILL OIL 1000 MG CAPS Take 3,000 mg by mouth daily.        Marland Kitchen levothyroxine (SYNTHROID, LEVOTHROID) 25 MCG tablet Take 1 tablet (25 mcg total) by mouth daily.  90 tablet  3  . metoprolol tartrate (LOPRESSOR) 25 MG tablet Take 1 tablet (25 mg total) by mouth 2 (two) times daily.  180 tablet  3  . vitamin B-12 (CYANOCOBALAMIN) 500 MCG tablet Take 1,000 mcg by mouth 2 (two) times daily.          Allergies (verified) Peanut-containing drug products and Penicillins   PAST HISTORY  Family History Family History  Problem Relation Age of Onset  . Ovarian cancer    . Cancer Mother     ovarian  . Heart disease Father   . Stroke Father   . Cancer Sister     brain    Social History History  Substance Use Topics  . Smoking status: Never Smoker   . Smokeless tobacco: Never Used  . Alcohol Use: No     Are there smokers in your home (other than you)? No  Risk Factors Current exercise habits: The patient does not participate in regular exercise at present.  Dietary issues discussed: increase diarrhea and need gluten free   Cardiac risk factors: advanced age (older than 6 for men, 55 for women), dyslipidemia and hypertension.  Depression Screen (Note: if answer to either of the following is "Yes", a more complete depression screening is indicated)   Over the past two weeks, have you felt down, depressed or hopeless? No  Over the past two weeks, have you felt little interest or pleasure in doing things? No  Have you lost interest or pleasure in daily life? No  Do you often feel hopeless?  No  Do you cry easily over simple problems? No  Activities of Daily Living In your present state of health, do you have any difficulty performing the following activities?:  Driving? Yes Managing money?  No Feeding yourself? No Getting from bed to chair? No Climbing a flight of stairs?  No Preparing food and eating?: No Bathing or showering? No Getting dressed: No Getting to the toilet? No Using the toilet:No Moving around from place to place: No In the past year have you fallen or had a near fall?:No   Are you sexually active?  No  Do you have more than one partner?  No  Hearing Difficulties: No Do you often ask people to speak up or repeat themselves? No Do you experience ringing or noises in your ears? No Do you have difficulty understanding soft or whispered voices? No   Do you feel that you have a problem with memory? No  Do you often misplace items? No  Do you feel safe at home?  No  Cognitive Testing  Alert? Yes  Normal Appearance?Yes  Oriented to person? Yes  Place? Yes   Time? Yes  Recall of three objects?  Yes  Can perform simple calculations? Yes  Displays appropriate judgment?Yes  Can read the correct time from a watch face?Yes   Advanced Directives have been discussed with the patient? Yes  List the Names of Other Physician/Practitioners you currently use: 1.    Indicate any recent Medical Services you may have received from other than [facility] providers in the past year (date may be approximate).  Immunization History  Administered Date(s) Administered  . Influenza Split 02/18/2011  . Influenza Whole 02/23/2010  . Pneumococcal Polysaccharide 03/04/2003  . Td 03/10/2002  . Tdap 02/27/2012  . Zoster 02/02/2005    Screening Tests Health Maintenance  Topic Date Due  . Influenza Vaccine  01/29/2012  . Tetanus/tdap  03/10/2012  . Colonoscopy  08/12/2013  . Pneumococcal Polysaccharide Vaccine Age 46 And Over  Completed  . Zostavax  Completed    All  answers were reviewed with the patient and necessary referrals were made:  Carrie Mew, MD   02/27/2012   History reviewed: allergies, current medications, past family history, past medical history, past social history, past surgical history and problem list  Review of Systems A comprehensive review of systems was negative.    Objective:     Vision by Snellen chart: right eye:20/20, left eye:20/20  Body mass index is 21.46 kg/(m^2). BP 136/80  Pulse 76  Temp 97.8 F (36.6 C)  Resp 16  Ht 5\' 4"  (1.626 m)  Wt 125 lb (56.7 kg)  BMI 21.46 kg/m2  BP 136/80  Pulse 76  Temp 97.8 F (36.6 C)  Resp 16  Ht 5\' 4"  (1.626 m)  Wt 125 lb (56.7 kg)  BMI 21.46 kg/m2  General Appearance:    Alert, cooperative, no distress, appears stated age  Head:    Normocephalic, without obvious abnormality, atraumatic  Eyes:    PERRL, conjunctiva/corneas clear, EOM's intact, fundi    benign, both eyes  Ears:    Normal TM's and external ear canals, both ears  Nose:   Nares normal, septum midline, mucosa normal, no drainage    or sinus tenderness  Throat:   Lips, mucosa, and tongue normal; teeth and gums normal  Neck:   Supple, symmetrical, trachea midline, no adenopathy;    thyroid:  no enlargement/tenderness/nodules; no carotid   bruit or JVD  Back:     Symmetric, no curvature, ROM normal, no CVA tenderness  Lungs:     Clear to auscultation bilaterally, respirations unlabored  Chest Wall:    No tenderness or deformity   Heart:    Regular rate and rhythm, S1 and S2 normal, no murmur, rub   or gallop  Breast Exam:    No tenderness, masses, or nipple abnormality  Abdomen:     Soft, non-tender, bowel sounds active all four quadrants,    no masses, no organomegaly  Genitalia:    Normal female without lesion, discharge or tenderness  Rectal:    Normal tone, normal prostate, no masses or tenderness;   guaiac negative stool  Extremities:   Extremities normal, atraumatic, no cyanosis or edema    Pulses:   2+ and symmetric all extremities  Skin:   Skin color, texture, turgor normal, no rashes or lesions  Lymph nodes:   Cervical, supraclavicular, and axillary nodes normal  Neurologic:   CNII-XII intact, normal strength, sensation and reflexes    throughout       Assessment:      This is a routine physical examination for this healthy  Female. Reviewed all health maintenance protocols including mammography colonoscopy bone density and reviewed appropriate screening labs. Her immunization history was reviewed as well as her current medications and allergies refills of her chronic medications were given and the plan for yearly health maintenance was discussed all orders and referrals were made as appropriate.      Plan:     During the course of the visit the patient was educated and counseled about appropriate screening and preventive services including:    Influenza vaccine  Diet review for nutrition referral? Yes ____  Not Indicated ____   Patient Instructions (the written plan) was given to the patient.  Medicare Attestation I have personally reviewed: The patient's medical and social history Their use of alcohol, tobacco or illicit drugs Their current medications and supplements The patient's functional ability including ADLs,fall risks, home safety risks, cognitive, and hearing and visual impairment Diet and physical activities Evidence for depression or mood disorders  The patient's weight, height, BMI, and visual acuity have been recorded in the chart.  I have made referrals, counseling, and provided education to the patient based on review of the above and I have provided the patient with a written personalized care plan for preventive services.     Carrie Mew, MD   02/27/2012

## 2012-04-01 ENCOUNTER — Other Ambulatory Visit: Payer: Self-pay | Admitting: Internal Medicine

## 2012-04-20 ENCOUNTER — Other Ambulatory Visit: Payer: Self-pay | Admitting: Internal Medicine

## 2012-05-15 DIAGNOSIS — M999 Biomechanical lesion, unspecified: Secondary | ICD-10-CM | POA: Diagnosis not present

## 2012-05-15 DIAGNOSIS — M5137 Other intervertebral disc degeneration, lumbosacral region: Secondary | ICD-10-CM | POA: Diagnosis not present

## 2012-05-15 DIAGNOSIS — M5430 Sciatica, unspecified side: Secondary | ICD-10-CM | POA: Diagnosis not present

## 2012-05-15 DIAGNOSIS — M5106 Intervertebral disc disorders with myelopathy, lumbar region: Secondary | ICD-10-CM | POA: Diagnosis not present

## 2012-05-29 DIAGNOSIS — M999 Biomechanical lesion, unspecified: Secondary | ICD-10-CM | POA: Diagnosis not present

## 2012-05-29 DIAGNOSIS — M5106 Intervertebral disc disorders with myelopathy, lumbar region: Secondary | ICD-10-CM | POA: Diagnosis not present

## 2012-05-29 DIAGNOSIS — M5430 Sciatica, unspecified side: Secondary | ICD-10-CM | POA: Diagnosis not present

## 2012-05-29 DIAGNOSIS — M5137 Other intervertebral disc degeneration, lumbosacral region: Secondary | ICD-10-CM | POA: Diagnosis not present

## 2012-07-27 DIAGNOSIS — M5106 Intervertebral disc disorders with myelopathy, lumbar region: Secondary | ICD-10-CM | POA: Diagnosis not present

## 2012-07-27 DIAGNOSIS — M5430 Sciatica, unspecified side: Secondary | ICD-10-CM | POA: Diagnosis not present

## 2012-07-27 DIAGNOSIS — M999 Biomechanical lesion, unspecified: Secondary | ICD-10-CM | POA: Diagnosis not present

## 2012-07-28 DIAGNOSIS — M5137 Other intervertebral disc degeneration, lumbosacral region: Secondary | ICD-10-CM | POA: Diagnosis not present

## 2012-08-06 DIAGNOSIS — M5106 Intervertebral disc disorders with myelopathy, lumbar region: Secondary | ICD-10-CM | POA: Diagnosis not present

## 2012-08-06 DIAGNOSIS — M999 Biomechanical lesion, unspecified: Secondary | ICD-10-CM | POA: Diagnosis not present

## 2012-08-13 ENCOUNTER — Other Ambulatory Visit: Payer: Self-pay

## 2012-08-21 DIAGNOSIS — M5430 Sciatica, unspecified side: Secondary | ICD-10-CM | POA: Diagnosis not present

## 2012-08-21 DIAGNOSIS — M5106 Intervertebral disc disorders with myelopathy, lumbar region: Secondary | ICD-10-CM | POA: Diagnosis not present

## 2012-08-21 DIAGNOSIS — M5137 Other intervertebral disc degeneration, lumbosacral region: Secondary | ICD-10-CM | POA: Diagnosis not present

## 2012-08-21 DIAGNOSIS — M999 Biomechanical lesion, unspecified: Secondary | ICD-10-CM | POA: Diagnosis not present

## 2012-08-28 ENCOUNTER — Ambulatory Visit: Payer: Medicare Other | Admitting: Internal Medicine

## 2012-09-02 ENCOUNTER — Other Ambulatory Visit: Payer: Self-pay | Admitting: Internal Medicine

## 2012-09-18 DIAGNOSIS — H00029 Hordeolum internum unspecified eye, unspecified eyelid: Secondary | ICD-10-CM | POA: Diagnosis not present

## 2012-09-20 ENCOUNTER — Ambulatory Visit: Payer: Medicare Other

## 2012-09-27 ENCOUNTER — Ambulatory Visit
Admission: RE | Admit: 2012-09-27 | Discharge: 2012-09-27 | Disposition: A | Discharge status: Home / Self Care | Payer: Medicare Other | Source: Ambulatory Visit

## 2012-09-27 DIAGNOSIS — Z1231 Encounter for screening mammogram for malignant neoplasm of breast: Secondary | ICD-10-CM | POA: Diagnosis not present

## 2012-10-07 ENCOUNTER — Other Ambulatory Visit: Payer: Self-pay | Admitting: Internal Medicine

## 2012-10-09 ENCOUNTER — Encounter: Payer: Self-pay | Admitting: Family Medicine

## 2012-10-09 ENCOUNTER — Ambulatory Visit (INDEPENDENT_AMBULATORY_CARE_PROVIDER_SITE_OTHER): Payer: Medicare Other | Admitting: Family Medicine

## 2012-10-09 VITALS — BP 130/80 | Temp 98.1°F | Wt 128.0 lb

## 2012-10-09 DIAGNOSIS — M549 Dorsalgia, unspecified: Secondary | ICD-10-CM | POA: Diagnosis not present

## 2012-10-09 MED ORDER — CYCLOBENZAPRINE HCL 5 MG PO TABS: 5.0000 mg | ORAL_TABLET | Freq: Three times a day (TID) | ORAL | Status: DC | PRN

## 2012-10-09 NOTE — Progress Notes (Signed)
Chief Complaint  Patient presents with  . Back Pain    and lower pelvic pain, nausea     HPI:  Acute visit for back pain: -has chronic back pain on and off 4-5 times per year for a few days -this episode started about 3 days ago, now resolving -pain is in mid and low back - feels like a spasm, mod-severe initially, constant,  and sometimes wraps around to gut - now minimal -sometimes spasm is bad and briefly feels nausea -first occurrence was over a year ago - can not remember an injury -denies: fevers, chill, vomiting, abd pain, dysuria, heamturia, urinary frequency or urgency, change in bowels, weight loss -s/p total hysterectomy for hormonal issues per her report   ROS: See pertinent positives and negatives per HPI.  Past Medical History  Diagnosis Date  . Allergy   . Asthma   . Diverticulosis   . History of diverticulitis of colon   . GERD (gastroesophageal reflux disease)   . Arthritis   . Hyperlipidemia   . Hypertension   . Thyroid disease     Family History  Problem Relation Age of Onset  . Ovarian cancer    . Cancer Mother     ovarian  . Heart disease Father   . Stroke Father   . Cancer Sister     brain    History   Social History  . Marital Status: Widowed    Spouse Name: N/A    Number of Children: N/A  . Years of Education: N/A   Occupational History  . retired    Social History Main Topics  . Smoking status: Never Smoker   . Smokeless tobacco: Never Used  . Alcohol Use: No  . Drug Use: No  . Sexually Active: Not Currently   Other Topics Concern  . None   Social History Narrative  . None    Current outpatient prescriptions:albuterol (PROVENTIL HFA;VENTOLIN HFA) 108 (90 BASE) MCG/ACT inhaler, Inhale 2 puffs into the lungs every 6 (six) hours as needed., Disp: , Rfl: ;  aspirin 81 MG tablet, Take 81 mg by mouth daily.  , Disp: , Rfl: ;  B Complex-C-Folic Acid (MULTIVITAMIN, STRESS FORMULA) tablet, Take 1 tablet by mouth daily.  , Disp: ,  Rfl: ;  calcium gluconate 500 MG tablet, Take 500 mg by mouth daily.  , Disp: , Rfl:  EPINEPHrine (EPIPEN JR) 0.15 MG/0.3ML injection, Inject 0.3 mLs (0.15 mg total) into the muscle as needed., Disp: 1 each, Rfl: 2;  estradiol (ESTRACE) 0.5 MG tablet, TAKE 1 TABLET BY MOUTH DAILY., Disp: 90 tablet, Rfl: 10;  estradiol (ESTRACE) 0.5 MG tablet, TAKE 1 TABLET BY MOUTH DAILY., Disp: 90 tablet, Rfl: 9;  folic acid (FOLVITE) 400 MCG tablet, Take 400 mcg by mouth daily.  , Disp: , Rfl:  glucosamine-chondroitin 500-400 MG tablet, Take 3 tablets by mouth daily. , Disp: , Rfl: ;  KRILL OIL 1000 MG CAPS, Take 3,000 mg by mouth daily.  , Disp: , Rfl: ;  levothyroxine (SYNTHROID, LEVOTHROID) 25 MCG tablet, TAKE 1 TABLET BY MOUTH DAILY, Disp: 90 tablet, Rfl: 1;  metoprolol tartrate (LOPRESSOR) 25 MG tablet, TAKE 1 TABLET BY MOUTH 2 TIMES DAILY., Disp: 180 tablet, Rfl: 2 vitamin B-12 (CYANOCOBALAMIN) 500 MCG tablet, Take 1,000 mcg by mouth 2 (two) times daily. , Disp: , Rfl: ;  cyclobenzaprine (FLEXERIL) 5 MG tablet, Take 1 tablet (5 mg total) by mouth 3 (three) times daily as needed for muscle spasms., Disp: 15  tablet, Rfl: 0  EXAM:  Filed Vitals:   10/09/12 1347  BP: 130/80  Temp: 98.1 F (36.7 C)    Body mass index is 21.96 kg/(m^2).  GENERAL: vitals reviewed and listed above, alert, oriented, appears well hydrated and in no acute distress  HEENT: atraumatic, conjunttiva clear, no obvious abnormalities on inspection of external nose and ears  NECK: no obvious masses on inspection  LUNGS: clear to auscultation bilaterally, no wheezes, rales or rhonchi, good air movement  CV: HRRR, no peripheral edema  ABD: BS+, soft, NTTP, no rebound or guarding, no CVA TTP  MS: moves all extremities without noticeable abnormality Normal Gait Normal inspection of back, no obvious scoliosis or leg length descrepancy No bony TTP Soft tissue TTP at: L lower thoracic and upper lumbar paraspinal muscles -/+ tests: neg  trendelenburg,-facet loading, -SLRT, -CLRT, -FABER, -FADIR Normal muscle strength, sensation to light touch and DTRs in LEs bilaterally   PSYCH: pleasant and cooperative, no obvious depression or anxiety  ASSESSMENT AND PLAN:  Discussed the following assessment and plan:  Back pain - Plan: cyclobenzaprine (FLEXERIL) 5 MG tablet  -suspect muscle spasm/strain given hx and exam findings, now resolving on its own -discussed other potential etiologies and offered urine/kidney studies, but deferred as she does feel this is muscular -supportive care, muscle relaxer to use sparingly, HEP -follow up with PCP in 4 weeks -Patient advised to return or notify a doctor immediately if symptoms worsen or persist or new concerns arise.  There are no Patient Instructions on file for this visit.   Kriste Basque R.

## 2012-10-16 DIAGNOSIS — M5106 Intervertebral disc disorders with myelopathy, lumbar region: Secondary | ICD-10-CM | POA: Diagnosis not present

## 2012-10-16 DIAGNOSIS — M999 Biomechanical lesion, unspecified: Secondary | ICD-10-CM | POA: Diagnosis not present

## 2012-10-16 DIAGNOSIS — M5137 Other intervertebral disc degeneration, lumbosacral region: Secondary | ICD-10-CM | POA: Diagnosis not present

## 2012-10-16 DIAGNOSIS — M5430 Sciatica, unspecified side: Secondary | ICD-10-CM | POA: Diagnosis not present

## 2012-10-30 DIAGNOSIS — M5106 Intervertebral disc disorders with myelopathy, lumbar region: Secondary | ICD-10-CM | POA: Diagnosis not present

## 2012-10-30 DIAGNOSIS — M5430 Sciatica, unspecified side: Secondary | ICD-10-CM | POA: Diagnosis not present

## 2012-10-30 DIAGNOSIS — M5137 Other intervertebral disc degeneration, lumbosacral region: Secondary | ICD-10-CM | POA: Diagnosis not present

## 2012-10-30 DIAGNOSIS — M999 Biomechanical lesion, unspecified: Secondary | ICD-10-CM | POA: Diagnosis not present

## 2012-11-13 DIAGNOSIS — M999 Biomechanical lesion, unspecified: Secondary | ICD-10-CM | POA: Diagnosis not present

## 2012-11-13 DIAGNOSIS — M5106 Intervertebral disc disorders with myelopathy, lumbar region: Secondary | ICD-10-CM | POA: Diagnosis not present

## 2012-11-13 DIAGNOSIS — M5430 Sciatica, unspecified side: Secondary | ICD-10-CM | POA: Diagnosis not present

## 2012-11-13 DIAGNOSIS — M5137 Other intervertebral disc degeneration, lumbosacral region: Secondary | ICD-10-CM | POA: Diagnosis not present

## 2012-12-04 DIAGNOSIS — M5430 Sciatica, unspecified side: Secondary | ICD-10-CM | POA: Diagnosis not present

## 2012-12-04 DIAGNOSIS — M5137 Other intervertebral disc degeneration, lumbosacral region: Secondary | ICD-10-CM | POA: Diagnosis not present

## 2012-12-04 DIAGNOSIS — M5106 Intervertebral disc disorders with myelopathy, lumbar region: Secondary | ICD-10-CM | POA: Diagnosis not present

## 2012-12-04 DIAGNOSIS — M999 Biomechanical lesion, unspecified: Secondary | ICD-10-CM | POA: Diagnosis not present

## 2012-12-05 ENCOUNTER — Ambulatory Visit: Payer: Medicare Other | Admitting: Internal Medicine

## 2012-12-05 ENCOUNTER — Encounter: Payer: Self-pay | Admitting: Internal Medicine

## 2012-12-05 ENCOUNTER — Ambulatory Visit (INDEPENDENT_AMBULATORY_CARE_PROVIDER_SITE_OTHER)
Admission: RE | Admit: 2012-12-05 | Discharge: 2012-12-05 | Disposition: A | Discharge status: Home / Self Care | Payer: Medicare Other | Source: Ambulatory Visit | Attending: Internal Medicine | Admitting: Internal Medicine

## 2012-12-05 ENCOUNTER — Ambulatory Visit (INDEPENDENT_AMBULATORY_CARE_PROVIDER_SITE_OTHER): Payer: Medicare Other | Admitting: Internal Medicine

## 2012-12-05 VITALS — BP 130/88 | HR 72 | Temp 97.9°F | Resp 16 | Ht 64.0 in | Wt 128.0 lb

## 2012-12-05 DIAGNOSIS — T7840XA Allergy, unspecified, initial encounter: Secondary | ICD-10-CM | POA: Diagnosis not present

## 2012-12-05 DIAGNOSIS — M4120 Other idiopathic scoliosis, site unspecified: Secondary | ICD-10-CM | POA: Diagnosis not present

## 2012-12-05 DIAGNOSIS — E785 Hyperlipidemia, unspecified: Secondary | ICD-10-CM | POA: Diagnosis not present

## 2012-12-05 DIAGNOSIS — Z8679 Personal history of other diseases of the circulatory system: Secondary | ICD-10-CM

## 2012-12-05 DIAGNOSIS — I10 Essential (primary) hypertension: Secondary | ICD-10-CM | POA: Diagnosis not present

## 2012-12-05 DIAGNOSIS — M545 Low back pain: Secondary | ICD-10-CM | POA: Diagnosis not present

## 2012-12-05 DIAGNOSIS — M546 Pain in thoracic spine: Secondary | ICD-10-CM

## 2012-12-05 DIAGNOSIS — E039 Hypothyroidism, unspecified: Secondary | ICD-10-CM

## 2012-12-05 MED ORDER — LEVOTHYROXINE SODIUM 25 MCG PO TABS: 25.0000 ug | ORAL_TABLET | Freq: Every day | ORAL | Status: DC

## 2012-12-05 MED ORDER — EPINEPHRINE 0.3 MG/0.3ML IJ SOAJ: 0.3000 mg | Freq: Once | INTRAMUSCULAR | Status: DC

## 2012-12-05 NOTE — Progress Notes (Signed)
  Subjective:    Patient ID: Sara Medina, female    DOB: 22-Apr-1930, 79 y.o.   MRN: [id number]  HPI Reason is an 79 year old female who is followed for hypertension hyperlipidemia with a history of gastroesophageal reflux and exercise-induced asthma.  She has mild allergic rhinitis  She brings a report of her blood pressures from her home demonstrating that she has mild whitecoat hypertension in the office and generally her blood pressures are excellent at home ranging from a low of  106  To a high of 141 systolic   Review of Systems  Constitutional: Negative for activity change, appetite change and fatigue.  HENT: Negative for ear pain, congestion, neck pain, postnasal drip and sinus pressure.   Eyes: Negative for redness and visual disturbance.  Respiratory: Negative for cough, shortness of breath and wheezing.   Gastrointestinal: Negative for abdominal pain and abdominal distention.  Genitourinary: Negative for dysuria, frequency and menstrual problem.  Musculoskeletal: Positive for back pain. Negative for myalgias, joint swelling and arthralgias.       Spasms  Skin: Negative for rash and wound.  Neurological: Negative for dizziness, weakness and headaches.  Hematological: Negative for adenopathy. Does not bruise/bleed easily.  Psychiatric/Behavioral: Negative for sleep disturbance and decreased concentration.       Objective:   Physical Exam  Nursing note and vitals reviewed. Constitutional: She is oriented to person, place, and time. She appears well-developed and well-nourished. No distress.  HENT:  Head: Normocephalic and atraumatic.  Right Ear: External ear normal.  Left Ear: External ear normal.  Nose: Nose normal.  Mouth/Throat: Oropharynx is clear and moist.  Eyes: Conjunctivae and EOM are normal. Pupils are equal, round, and reactive to light.  Neck: Normal range of motion. Neck supple. No JVD present. No tracheal deviation present. No thyromegaly present.   Cardiovascular: Normal rate, regular rhythm, normal heart sounds and intact distal pulses.   No murmur heard. Pulmonary/Chest: Effort normal and breath sounds normal. She has no wheezes. She exhibits no tenderness.  Abdominal: Soft. Bowel sounds are normal.  Musculoskeletal: Normal range of motion. She exhibits no edema and no tenderness.  Lymphadenopathy:    She has no cervical adenopathy.  Neurological: She is alert and oriented to person, place, and time. She has normal reflexes. No cranial nerve deficit.  Skin: Skin is warm and dry. She is not diaphoretic.  Psychiatric: She has a normal mood and affect. Her behavior is normal.          Assessment & Plan:  Back spams on left Lasting "days" and does not respond to heat, ice or even chiropractor treatments Stretchs HTN stable Gerd Stable Lipids labs reveiwed  throracic spine

## 2012-12-05 NOTE — Patient Instructions (Addendum)
The patient is instructed to continue all medications as prescribed. Schedule followup with check out clerk upon leaving the clinic  

## 2012-12-18 DIAGNOSIS — M5106 Intervertebral disc disorders with myelopathy, lumbar region: Secondary | ICD-10-CM | POA: Diagnosis not present

## 2012-12-18 DIAGNOSIS — M999 Biomechanical lesion, unspecified: Secondary | ICD-10-CM | POA: Diagnosis not present

## 2012-12-18 DIAGNOSIS — M5430 Sciatica, unspecified side: Secondary | ICD-10-CM | POA: Diagnosis not present

## 2012-12-18 DIAGNOSIS — M5137 Other intervertebral disc degeneration, lumbosacral region: Secondary | ICD-10-CM | POA: Diagnosis not present

## 2012-12-25 DIAGNOSIS — M5106 Intervertebral disc disorders with myelopathy, lumbar region: Secondary | ICD-10-CM | POA: Diagnosis not present

## 2012-12-25 DIAGNOSIS — M5137 Other intervertebral disc degeneration, lumbosacral region: Secondary | ICD-10-CM | POA: Diagnosis not present

## 2012-12-25 DIAGNOSIS — M999 Biomechanical lesion, unspecified: Secondary | ICD-10-CM | POA: Diagnosis not present

## 2012-12-25 DIAGNOSIS — M5430 Sciatica, unspecified side: Secondary | ICD-10-CM | POA: Diagnosis not present

## 2013-01-08 DIAGNOSIS — M999 Biomechanical lesion, unspecified: Secondary | ICD-10-CM | POA: Diagnosis not present

## 2013-01-08 DIAGNOSIS — M5430 Sciatica, unspecified side: Secondary | ICD-10-CM | POA: Diagnosis not present

## 2013-01-08 DIAGNOSIS — M5106 Intervertebral disc disorders with myelopathy, lumbar region: Secondary | ICD-10-CM | POA: Diagnosis not present

## 2013-01-08 DIAGNOSIS — M5137 Other intervertebral disc degeneration, lumbosacral region: Secondary | ICD-10-CM | POA: Diagnosis not present

## 2013-01-10 ENCOUNTER — Other Ambulatory Visit: Payer: Self-pay | Admitting: Internal Medicine

## 2013-01-22 DIAGNOSIS — M999 Biomechanical lesion, unspecified: Secondary | ICD-10-CM | POA: Diagnosis not present

## 2013-01-22 DIAGNOSIS — M5137 Other intervertebral disc degeneration, lumbosacral region: Secondary | ICD-10-CM | POA: Diagnosis not present

## 2013-01-22 DIAGNOSIS — M5430 Sciatica, unspecified side: Secondary | ICD-10-CM | POA: Diagnosis not present

## 2013-01-22 DIAGNOSIS — M5106 Intervertebral disc disorders with myelopathy, lumbar region: Secondary | ICD-10-CM | POA: Diagnosis not present

## 2013-02-05 DIAGNOSIS — M5430 Sciatica, unspecified side: Secondary | ICD-10-CM | POA: Diagnosis not present

## 2013-02-05 DIAGNOSIS — M999 Biomechanical lesion, unspecified: Secondary | ICD-10-CM | POA: Diagnosis not present

## 2013-02-05 DIAGNOSIS — M5106 Intervertebral disc disorders with myelopathy, lumbar region: Secondary | ICD-10-CM | POA: Diagnosis not present

## 2013-02-05 DIAGNOSIS — M5137 Other intervertebral disc degeneration, lumbosacral region: Secondary | ICD-10-CM | POA: Diagnosis not present

## 2013-02-12 ENCOUNTER — Ambulatory Visit (INDEPENDENT_AMBULATORY_CARE_PROVIDER_SITE_OTHER): Payer: Medicare Other

## 2013-02-12 DIAGNOSIS — Z23 Encounter for immunization: Secondary | ICD-10-CM | POA: Diagnosis not present

## 2013-03-05 DIAGNOSIS — M999 Biomechanical lesion, unspecified: Secondary | ICD-10-CM | POA: Diagnosis not present

## 2013-03-05 DIAGNOSIS — M5430 Sciatica, unspecified side: Secondary | ICD-10-CM | POA: Diagnosis not present

## 2013-03-05 DIAGNOSIS — M5106 Intervertebral disc disorders with myelopathy, lumbar region: Secondary | ICD-10-CM | POA: Diagnosis not present

## 2013-03-05 DIAGNOSIS — M5137 Other intervertebral disc degeneration, lumbosacral region: Secondary | ICD-10-CM | POA: Diagnosis not present

## 2013-03-18 ENCOUNTER — Other Ambulatory Visit (INDEPENDENT_AMBULATORY_CARE_PROVIDER_SITE_OTHER): Payer: Medicare Other

## 2013-03-18 DIAGNOSIS — E785 Hyperlipidemia, unspecified: Secondary | ICD-10-CM | POA: Diagnosis not present

## 2013-03-18 DIAGNOSIS — I10 Essential (primary) hypertension: Secondary | ICD-10-CM

## 2013-03-18 DIAGNOSIS — E039 Hypothyroidism, unspecified: Secondary | ICD-10-CM | POA: Diagnosis not present

## 2013-03-18 LAB — HEPATIC FUNCTION PANEL
Albumin: 3.8 g/dL (ref 3.5–5.2)
Alkaline Phosphatase: 34 U/L — ABNORMAL LOW (ref 39–117)
Bilirubin, Direct: 0 mg/dL (ref 0.0–0.3)

## 2013-03-18 LAB — POCT URINALYSIS DIPSTICK
Glucose, UA: NEGATIVE
Ketones, UA: NEGATIVE
Spec Grav, UA: <=1.005
Urobilinogen, UA: 0.2

## 2013-03-18 LAB — LIPID PANEL
Cholesterol: 241 mg/dL — ABNORMAL HIGH (ref 0–200)
Total CHOL/HDL Ratio: 5
Triglycerides: 168 mg/dL — ABNORMAL HIGH (ref 0.0–149.0)

## 2013-03-18 LAB — CBC WITH DIFFERENTIAL/PLATELET
Basophils Absolute: 0 10*3/uL (ref 0.0–0.1)
Eosinophils Absolute: 0.2 10*3/uL (ref 0.0–0.7)
Lymphocytes Relative: 27.9 % (ref 12.0–46.0)
MCHC: 33.9 g/dL (ref 30.0–36.0)
Neutrophils Relative %: 61.3 % (ref 43.0–77.0)
RDW: 13.1 % (ref 11.5–14.6)

## 2013-03-18 LAB — BASIC METABOLIC PANEL
BUN: 18 mg/dL (ref 6–23)
CO2: 25 mEq/L (ref 19–32)
Calcium: 8.9 mg/dL (ref 8.4–10.5)
Creatinine, Ser: 0.9 mg/dL (ref 0.4–1.2)
Glucose, Bld: 97 mg/dL (ref 70–99)

## 2013-03-19 DIAGNOSIS — M5430 Sciatica, unspecified side: Secondary | ICD-10-CM | POA: Diagnosis not present

## 2013-03-19 DIAGNOSIS — M5106 Intervertebral disc disorders with myelopathy, lumbar region: Secondary | ICD-10-CM | POA: Diagnosis not present

## 2013-03-19 DIAGNOSIS — M5137 Other intervertebral disc degeneration, lumbosacral region: Secondary | ICD-10-CM | POA: Diagnosis not present

## 2013-03-19 DIAGNOSIS — M999 Biomechanical lesion, unspecified: Secondary | ICD-10-CM | POA: Diagnosis not present

## 2013-03-25 ENCOUNTER — Ambulatory Visit (INDEPENDENT_AMBULATORY_CARE_PROVIDER_SITE_OTHER): Payer: Medicare Other | Admitting: Internal Medicine

## 2013-03-25 ENCOUNTER — Encounter: Payer: Self-pay | Admitting: Internal Medicine

## 2013-03-25 VITALS — BP 130/80 | HR 76 | Temp 97.8°F | Resp 16 | Ht 64.0 in | Wt 128.0 lb

## 2013-03-25 DIAGNOSIS — N390 Urinary tract infection, site not specified: Secondary | ICD-10-CM | POA: Diagnosis not present

## 2013-03-25 DIAGNOSIS — R197 Diarrhea, unspecified: Secondary | ICD-10-CM | POA: Diagnosis not present

## 2013-03-25 DIAGNOSIS — E785 Hyperlipidemia, unspecified: Secondary | ICD-10-CM

## 2013-03-25 DIAGNOSIS — Z0000 Encounter for general adult medical examination without abnormal findings: Secondary | ICD-10-CM | POA: Diagnosis not present

## 2013-03-25 MED ORDER — CIPROFLOXACIN HCL 250 MG PO TABS: 250.0000 mg | ORAL_TABLET | Freq: Two times a day (BID) | ORAL | Status: DC

## 2013-03-25 MED ORDER — GEMFIBROZIL 600 MG PO TABS: 600.0000 mg | ORAL_TABLET | Freq: Two times a day (BID) | ORAL | Status: DC

## 2013-03-25 NOTE — Progress Notes (Signed)
  Subjective:    Patient ID: Sara Medina, female    DOB: [birth year], 79 y.o.   MRN: [id number]  HPI Patient presents for annual examination she had blood work done prior to the examination based upon age specific protocol.  The patient has done well she brings with her a recording of her pulse and blood pressure showing that generally her blood pressure ranges between the high 120s and the low 100s and her diastolic numbers average in the 60's Pulse is also well controlled There is no fluctuation of her blood pressure with activity  Has had a urinary tract infection for approximately one week   Review of Systems  Constitutional: Negative for activity change, appetite change and fatigue.  HENT: Negative for congestion, ear pain, postnasal drip and sinus pressure.   Eyes: Negative for redness and visual disturbance.  Respiratory: Negative for cough, shortness of breath and wheezing.   Gastrointestinal: Negative for abdominal pain and abdominal distention.  Genitourinary: Negative for dysuria, frequency and menstrual problem.  Musculoskeletal: Negative for arthralgias, joint swelling, myalgias and neck pain.  Skin: Negative for rash and wound.  Neurological: Negative for dizziness, weakness and headaches.  Hematological: Negative for adenopathy. Does not bruise/bleed easily.  Psychiatric/Behavioral: Negative for sleep disturbance and decreased concentration.       Objective:   Physical Exam  Nursing note reviewed. Constitutional: She is oriented to person, place, and time. She appears well-developed and well-nourished. No distress.  HENT:  Head: Normocephalic and atraumatic.  Eyes: Conjunctivae and EOM are normal. Pupils are equal, round, and reactive to light.  Neck: Normal range of motion. Neck supple. No JVD present. No tracheal deviation present. No thyromegaly present.  Cardiovascular: Normal rate and regular rhythm.   No murmur heard. Pulmonary/Chest: Effort normal and breath  sounds normal. She has no wheezes. She exhibits no tenderness.  Abdominal: Soft. Bowel sounds are normal.  Musculoskeletal: Normal range of motion. She exhibits no edema and no tenderness.  Lymphadenopathy:    She has no cervical adenopathy.  Neurological: She is alert and oriented to person, place, and time. She has normal reflexes. No cranial nerve deficit.  Skin: Skin is warm and dry. She is not diaphoretic.  Psychiatric: She has a normal mood and affect. Her behavior is normal.          Assessment & Plan:   This is a routine physical examination for this healthy  Female. Reviewed all health maintenance protocols including mammography colonoscopy bone density and reviewed appropriate screening labs. Her immunization history was reviewed as well as her current medications and allergies refills of her chronic medications were given and the plan for yearly health maintenance was discussed all orders and referrals were made as appropriate.  For the five-day ciprofloxacin  protocol for UTI Add lopid for diarrhea and elevated lDL  For C. Difficile due to her chronic diarrhea

## 2013-03-25 NOTE — Patient Instructions (Addendum)
Lopid generic medicine for cholesterol that has been around for over 40 years Primarily binds fat in your stool  and does not have any side effects because it is not absorbed into the system. Primarily we're using it for your chronic loose stools but also will help you lower your LDL cholesterol

## 2013-04-02 DIAGNOSIS — M5137 Other intervertebral disc degeneration, lumbosacral region: Secondary | ICD-10-CM | POA: Diagnosis not present

## 2013-04-02 DIAGNOSIS — M5430 Sciatica, unspecified side: Secondary | ICD-10-CM | POA: Diagnosis not present

## 2013-04-02 DIAGNOSIS — M999 Biomechanical lesion, unspecified: Secondary | ICD-10-CM | POA: Diagnosis not present

## 2013-04-02 DIAGNOSIS — M5106 Intervertebral disc disorders with myelopathy, lumbar region: Secondary | ICD-10-CM | POA: Diagnosis not present

## 2013-04-24 ENCOUNTER — Encounter: Payer: Medicare Other | Admitting: Internal Medicine

## 2013-05-13 ENCOUNTER — Emergency Department (HOSPITAL_COMMUNITY): Payer: Medicare Other

## 2013-05-13 ENCOUNTER — Encounter (HOSPITAL_COMMUNITY): Payer: Self-pay | Admitting: Emergency Medicine

## 2013-05-13 ENCOUNTER — Observation Stay (HOSPITAL_COMMUNITY)
Admission: EM | Admit: 2013-05-13 | Discharge: 2013-05-14 | Disposition: A | Discharge status: Home / Self Care | Payer: Medicare Other | Attending: Internal Medicine | Admitting: Internal Medicine

## 2013-05-13 ENCOUNTER — Telehealth: Payer: Self-pay | Admitting: Internal Medicine

## 2013-05-13 DIAGNOSIS — Z7982 Long term (current) use of aspirin: Secondary | ICD-10-CM | POA: Insufficient documentation

## 2013-05-13 DIAGNOSIS — R079 Chest pain, unspecified: Secondary | ICD-10-CM | POA: Insufficient documentation

## 2013-05-13 DIAGNOSIS — Z79899 Other long term (current) drug therapy: Secondary | ICD-10-CM | POA: Diagnosis not present

## 2013-05-13 DIAGNOSIS — E785 Hyperlipidemia, unspecified: Secondary | ICD-10-CM | POA: Insufficient documentation

## 2013-05-13 DIAGNOSIS — Z8679 Personal history of other diseases of the circulatory system: Secondary | ICD-10-CM

## 2013-05-13 DIAGNOSIS — Z880 Allergy status to penicillin: Secondary | ICD-10-CM | POA: Insufficient documentation

## 2013-05-13 DIAGNOSIS — E079 Disorder of thyroid, unspecified: Secondary | ICD-10-CM | POA: Diagnosis not present

## 2013-05-13 DIAGNOSIS — R0789 Other chest pain: Secondary | ICD-10-CM

## 2013-05-13 DIAGNOSIS — E039 Hypothyroidism, unspecified: Secondary | ICD-10-CM | POA: Diagnosis not present

## 2013-05-13 DIAGNOSIS — K219 Gastro-esophageal reflux disease without esophagitis: Secondary | ICD-10-CM | POA: Insufficient documentation

## 2013-05-13 DIAGNOSIS — I10 Essential (primary) hypertension: Secondary | ICD-10-CM | POA: Diagnosis not present

## 2013-05-13 DIAGNOSIS — J45901 Unspecified asthma with (acute) exacerbation: Secondary | ICD-10-CM | POA: Insufficient documentation

## 2013-05-13 DIAGNOSIS — K5730 Diverticulosis of large intestine without perforation or abscess without bleeding: Secondary | ICD-10-CM | POA: Insufficient documentation

## 2013-05-13 DIAGNOSIS — M129 Arthropathy, unspecified: Secondary | ICD-10-CM | POA: Diagnosis not present

## 2013-05-13 HISTORY — DX: Other chest pain: R07.89

## 2013-05-13 LAB — COMPREHENSIVE METABOLIC PANEL
BUN: 22 mg/dL (ref 6–23)
CO2: 22 mEq/L (ref 19–32)
Calcium: 9.8 mg/dL (ref 8.4–10.5)
Creatinine, Ser: 0.87 mg/dL (ref 0.50–1.10)
GFR calc Af Amer: 70 mL/min — ABNORMAL LOW (ref >90)
GFR calc non Af Amer: 61 mL/min — ABNORMAL LOW (ref >90)
Glucose, Bld: 110 mg/dL — ABNORMAL HIGH (ref 70–99)
Total Bilirubin: 0.1 mg/dL — ABNORMAL LOW (ref 0.3–1.2)

## 2013-05-13 LAB — POCT I-STAT TROPONIN I: Troponin i, poc: 0 ng/mL (ref 0.00–0.08)

## 2013-05-13 LAB — D-DIMER, QUANTITATIVE: D-Dimer, Quant: 0.42 ug/mL-FEU (ref 0.00–0.48)

## 2013-05-13 LAB — CBC
HCT: 37.9 % (ref 36.0–46.0)
MCH: 32.1 pg (ref 26.0–34.0)
MCV: 94.3 fL (ref 78.0–100.0)
Platelets: 337 10*3/uL (ref 150–400)
RBC: 4.02 MIL/uL (ref 3.87–5.11)

## 2013-05-13 LAB — TROPONIN I
Troponin I: <0.3 ng/mL (ref <0.30)
Troponin I: <0.3 ng/mL (ref <0.30)

## 2013-05-13 MED ORDER — GEMFIBROZIL 600 MG PO TABS
600.0000 mg | ORAL_TABLET | Freq: Two times a day (BID) | ORAL | Status: DC
  Administered 2013-05-14: 600 mg via ORAL
  Filled 2013-05-13 (×3): qty 1

## 2013-05-13 MED ORDER — GLUCOSAMINE-CHONDROITIN 500-400 MG PO TABS: 3.0000 | ORAL_TABLET | Freq: Every day | ORAL | Status: DC

## 2013-05-13 MED ORDER — ACETAMINOPHEN 650 MG RE SUPP: 650.0000 mg | Freq: Four times a day (QID) | RECTAL | Status: DC | PRN

## 2013-05-13 MED ORDER — LABETALOL HCL 5 MG/ML IV SOLN: 10.0000 mg | INTRAVENOUS | Status: DC | PRN

## 2013-05-13 MED ORDER — CYCLOBENZAPRINE HCL 5 MG PO TABS
5.0000 mg | ORAL_TABLET | Freq: Three times a day (TID) | ORAL | Status: DC | PRN
  Filled 2013-05-13: qty 1

## 2013-05-13 MED ORDER — ACETAMINOPHEN 325 MG PO TABS: 650.0000 mg | ORAL_TABLET | Freq: Four times a day (QID) | ORAL | Status: DC | PRN

## 2013-05-13 MED ORDER — NITROGLYCERIN 0.4 MG SL SUBL
0.4000 mg | SUBLINGUAL_TABLET | SUBLINGUAL | Status: DC | PRN
  Administered 2013-05-13: 0.4 mg via SUBLINGUAL

## 2013-05-13 MED ORDER — FOLIC ACID 0.5 MG HALF TAB
500.0000 ug | ORAL_TABLET | Freq: Every day | ORAL | Status: DC
  Filled 2013-05-13: qty 1

## 2013-05-13 MED ORDER — ESTRADIOL 1 MG PO TABS
0.5000 mg | ORAL_TABLET | Freq: Every day | ORAL | Status: DC
  Administered 2013-05-13: 0.5 mg via ORAL
  Filled 2013-05-13 (×2): qty 0.5

## 2013-05-13 MED ORDER — CYANOCOBALAMIN 500 MCG PO TABS
1000.0000 ug | ORAL_TABLET | Freq: Two times a day (BID) | ORAL | Status: DC
  Filled 2013-05-13 (×3): qty 2

## 2013-05-13 MED ORDER — METOPROLOL TARTRATE 12.5 MG HALF TABLET
12.5000 mg | ORAL_TABLET | Freq: Two times a day (BID) | ORAL | Status: DC
  Administered 2013-05-13 – 2013-05-14 (×2): 12.5 mg via ORAL
  Filled 2013-05-13 (×3): qty 1

## 2013-05-13 MED ORDER — LEVOTHYROXINE SODIUM 25 MCG PO TABS
25.0000 ug | ORAL_TABLET | Freq: Every day | ORAL | Status: DC
  Administered 2013-05-14: 25 ug via ORAL
  Filled 2013-05-13 (×2): qty 1

## 2013-05-13 MED ORDER — HYDROCODONE-ACETAMINOPHEN 5-325 MG PO TABS: 1.0000 | ORAL_TABLET | ORAL | Status: DC | PRN

## 2013-05-13 MED ORDER — ASPIRIN 81 MG PO CHEW
324.0000 mg | CHEWABLE_TABLET | Freq: Once | ORAL | Status: AC
  Administered 2013-05-13: 324 mg via ORAL
  Filled 2013-05-13: qty 4

## 2013-05-13 MED ORDER — RENA-VITE PO TABS
1.0000 | ORAL_TABLET | Freq: Every day | ORAL | Status: DC
  Filled 2013-05-13: qty 1

## 2013-05-13 MED ORDER — ASPIRIN 81 MG PO CHEW
81.0000 mg | CHEWABLE_TABLET | Freq: Every day | ORAL | Status: DC
  Administered 2013-05-14: 81 mg via ORAL
  Filled 2013-05-13 (×2): qty 1

## 2013-05-13 MED ORDER — ONDANSETRON HCL 4 MG PO TABS: 4.0000 mg | ORAL_TABLET | Freq: Four times a day (QID) | ORAL | Status: DC | PRN

## 2013-05-13 MED ORDER — ONDANSETRON HCL 4 MG/2ML IJ SOLN: 4.0000 mg | Freq: Four times a day (QID) | INTRAMUSCULAR | Status: DC | PRN

## 2013-05-13 MED ORDER — CALCIUM GLUCONATE 500 MG PO TABS
500.0000 mg | ORAL_TABLET | Freq: Every day | ORAL | Status: DC
  Filled 2013-05-13 (×2): qty 1

## 2013-05-13 MED ORDER — ENOXAPARIN SODIUM 40 MG/0.4ML ~~LOC~~ SOLN
40.0000 mg | SUBCUTANEOUS | Status: DC
  Filled 2013-05-13 (×2): qty 0.4

## 2013-05-13 MED ORDER — GI COCKTAIL ~~LOC~~
30.0000 mL | Freq: Once | ORAL | Status: AC
  Administered 2013-05-13: 30 mL via ORAL
  Filled 2013-05-13 (×2): qty 30

## 2013-05-13 NOTE — ED Provider Notes (Signed)
TIME SEEN: 3:17 PM  CHIEF COMPLAINT: Chest pain, shortness of breath  HPI: Patient is an 79 year old female with a history of hypertension, hyperlipidemia, hypothyroidism who presents emergency department 2 weeks of intermittent chest pressure with radiation into her jaw and shortness of breath. She had an episode that started this morning while at rest. States is slightly worse with inspiration. Denies any alleviating factors. She states it has slightly improved and is almost gone but is still present. She denies any nausea, vomiting, diaphoresis or dizziness. No prior history of cardiac disease. She reports her last stress test was approximately 10 years ago. She does not have a cardiologist here in [place]. She denies a history of PE, DVT, lower extremity swelling or pain, recent hospitalization or long flights, surgery, trauma, fracture. No fever or cough.  ROS: See HPI Constitutional: no fever  Eyes: no drainage  ENT: no runny nose   Cardiovascular:  chest pain  Resp: SOB  GI: no vomiting GU: no dysuria Integumentary: no rash  Allergy: no hives  Musculoskeletal: no leg swelling  Neurological: no slurred speech ROS otherwise negative  PAST MEDICAL HISTORY/PAST SURGICAL HISTORY:  Past Medical History  Diagnosis Date  . Allergy   . Asthma   . Diverticulosis   . History of diverticulitis of colon   . GERD (gastroesophageal reflux disease)   . Arthritis   . Hyperlipidemia   . Hypertension   . Thyroid disease     MEDICATIONS:  Prior to Admission medications   Medication Sig Start Date End Date Taking? Authorizing Provider  aspirin 81 MG tablet Take 81 mg by mouth daily.     Yes Historical Provider, MD  B Complex-C-Folic Acid (MULTIVITAMIN, STRESS FORMULA) tablet Take 1 tablet by mouth daily.     Yes Historical Provider, MD  calcium gluconate 500 MG tablet Take 500 mg by mouth daily.     Yes Historical Provider, MD  estradiol (ESTRACE) 0.5 MG tablet Take 0.5 mg by mouth  daily.   Yes Historical Provider, MD  folic acid (FOLVITE) 400 MCG tablet Take 400 mcg by mouth daily.     Yes Historical Provider, MD  gemfibrozil (LOPID) 600 MG tablet Take 1 tablet (600 mg total) by mouth 2 (two) times daily before a meal. 03/25/13  Yes Stacie Glaze, MD  glucosamine-chondroitin 500-400 MG tablet Take 3 tablets by mouth daily.    Yes Historical Provider, MD  levothyroxine (SYNTHROID, LEVOTHROID) 25 MCG tablet Take 1 tablet (25 mcg total) by mouth daily before breakfast. 12/05/12  Yes Stacie Glaze, MD  metoprolol tartrate (LOPRESSOR) 25 MG tablet Take 12.5 mg by mouth 2 (two) times daily.   Yes Historical Provider, MD  vitamin B-12 (CYANOCOBALAMIN) 500 MCG tablet Take 1,000 mcg by mouth 2 (two) times daily.    Yes Historical Provider, MD  cyclobenzaprine (FLEXERIL) 5 MG tablet Take 1 tablet (5 mg total) by mouth 3 (three) times daily as needed for muscle spasms. 10/09/12   Terressa Koyanagi, DO    ALLERGIES:  Allergies  Allergen Reactions  . Peanut-Containing Drug Products   . Penicillins     SOCIAL HISTORY:  History  Substance Use Topics  . Smoking status: Never Smoker   . Smokeless tobacco: Never Used  . Alcohol Use: No    FAMILY HISTORY: Family History  Problem Relation Age of Onset  . Ovarian cancer    . Cancer Mother     ovarian  . Heart disease Father   . Stroke  Father   . Cancer Sister     brain    EXAM: BP 155/85  Pulse 91  Temp(Src) 97.9 F (36.6 C)  Resp 16  Wt 127 lb (57.607 kg)  SpO2 98% CONSTITUTIONAL: Alert and oriented and responds appropriately to questions. Well-appearing; well-nourished HEAD: Normocephalic EYES: Conjunctivae clear, PERRL ENT: normal nose; no rhinorrhea; moist mucous membranes; pharynx without lesions noted NECK: Supple, no meningismus, no LAD  CARD: RRR; S1 and S2 appreciated; no murmurs, no clicks, no rubs, no gallops RESP: Normal chest excursion without splinting or tachypnea; breath sounds clear and equal  bilaterally; no wheezes, no rhonchi, no rales,  ABD/GI: Normal bowel sounds; non-distended; soft, non-tender, no rebound, no guarding BACK:  The back appears normal and is non-tender to palpation, there is no CVA tenderness EXT: Normal ROM in all joints; non-tender to palpation; no edema; normal capillary refill; no cyanosis    SKIN: Normal color for age and race; warm NEURO: Moves all extremities equally PSYCH: The patient's mood and manner are appropriate. Grooming and personal hygiene are appropriate.  MEDICAL DECISION MAKING: Patient here with chest pain and shortness of breath. She has multiple risk factors for ACS reports her last stress test was 10 years ago. Her EKG shows no ischemic changes but frequent PACs. Her labs are unremarkable. Troponin negative. Chest x-ray clear. Have discussed with patient that I recommend admission for ACS rule out. Patient and daughter is at bedside verbalize understanding and are comfortable with this plan. Discussed with hospitalist for admission for chest pain observation.   EKG Interpretation    Date/Time:  Monday May 13 2013 14:18:23 EST Ventricular Rate:  89 PR Interval:  198 QRS Duration: 80 QT Interval:  366 QTC Calculation: 445 R Axis:   50 Text Interpretation:  Sinus rhythm with Premature atrial complexes Otherwise normal ECG Confirmed by WARD  DO, KRISTEN ([id number]) on 05/13/2013 3:16:03 PM               Layla Maw Ward, DO 05/13/13 5409

## 2013-05-13 NOTE — ED Notes (Signed)
Pressure in chest started this am hx of afib on beta blocker  Some sob no n/v   No sweating

## 2013-05-13 NOTE — ED Notes (Signed)
Sara Medina(Daughter)- [phone]

## 2013-05-13 NOTE — Telephone Encounter (Signed)
Patient Information:  Caller Name: [name]  Phone: [phone]  Patient: Sara Medina, Sara Medina  Gender: Female  DOB: [birth year]  Age: 79 Years  PCP: Darryll Capers (Adults only)  Office Follow Up:  Does the office need to follow up with this patient?: No.   Heads up to office pt was sent to ED due to sx; will go to [facility] For The Office: N/A   Symptoms  Reason For Call & Symptoms: Pt is calling and states that her BP is very high and having some irregular heart beats; denies chest pain;  BP 160/102 today at 12:15pm; having chest pressure; states feeling "odd;"  Reviewed Health History In EMR: Yes  Reviewed Medications In EMR: Yes  Reviewed Allergies In EMR: Yes  Reviewed Surgeries / Procedures: Yes  Date of Onset of Symptoms: 05/13/2013  Guideline(s) Used:  Chest Pain  Disposition Per Guideline:   Go to ED Now  Reason For Disposition Reached:   Heart beating irregularly or very rapidly  Advice Given:  Call Back If:  Severe chest pain  Difficulty breathing  You become worse.  Patient Will Follow Care Advice:  YES

## 2013-05-13 NOTE — Telephone Encounter (Signed)
Dr Jenkins aware 

## 2013-05-13 NOTE — H&P (Signed)
[facility] History and Physical  Nikayla Madaris KGM:[id number] DOB: 20-Jul-1929 DOA: 05/13/2013  Referring physician: Ward PCP: Carrie Mew, MD   Chief Complaint: Chest tightness  HPI: Dnya Hickle is a 79 y.o. female presents to the emergency room with chest tightness, rather diffuse. It started while sitting at a table writing Christmas cards. She also feels slightly short of breath, like she can't take a deep breath. She is very active and has no problems walking 3 miles a day. She had a stress test out of town 5-10 years ago that was reportedly normal. She has a history of acid reflux, but this does not feel like her typical reflux symptoms. She's had no wheezing. She has a chronic cough from chronic postnasal drip. She has had no fevers or chills. No palpitations. EKG and troponin in the emergency room are normal. Chest x-ray unremarkable. The pain at its worst was 3/10. Currently 1-2/10. Relieved by sublingual nitroglycerin. She takes low-dose aspirin daily.  Review of Systems: Systems reviewed. As above otherwise negative.  Past Medical History  Diagnosis Date  . Allergy   . Asthma   . Diverticulosis   . History of diverticulitis of colon   . GERD (gastroesophageal reflux disease)   . Arthritis   . Hyperlipidemia   . Hypertension   . Thyroid disease    Past Surgical History  Procedure Laterality Date  . Trigger thumb    . Abdominal hysterectomy    . Tonsillectomy     Social History:  reports that she has never smoked. She has never used smokeless tobacco. She reports that she does not drink alcohol or use illicit drugs. lives alone. Very active.  Allergies  Allergen Reactions  . Peanut-Containing Drug Products   . Penicillins     Family History  Problem Relation Age of Onset  . Ovarian cancer    . Cancer Mother     ovarian  . Heart disease Father   . Stroke Father   . Cancer Sister     brain     Prior to Admission medications   Medication Sig Start  Date End Date Taking? Authorizing Provider  aspirin 81 MG tablet Take 81 mg by mouth daily.     Yes Historical Provider, MD  B Complex-C-Folic Acid (MULTIVITAMIN, STRESS FORMULA) tablet Take 1 tablet by mouth daily.     Yes Historical Provider, MD  calcium gluconate 500 MG tablet Take 500 mg by mouth daily.     Yes Historical Provider, MD  estradiol (ESTRACE) 0.5 MG tablet Take 0.5 mg by mouth daily.   Yes Historical Provider, MD  folic acid (FOLVITE) 400 MCG tablet Take 400 mcg by mouth daily.     Yes Historical Provider, MD  gemfibrozil (LOPID) 600 MG tablet Take 1 tablet (600 mg total) by mouth 2 (two) times daily before a meal. 03/25/13  Yes Stacie Glaze, MD  glucosamine-chondroitin 500-400 MG tablet Take 3 tablets by mouth daily.    Yes Historical Provider, MD  levothyroxine (SYNTHROID, LEVOTHROID) 25 MCG tablet Take 1 tablet (25 mcg total) by mouth daily before breakfast. 12/05/12  Yes Stacie Glaze, MD  metoprolol tartrate (LOPRESSOR) 25 MG tablet Take 12.5 mg by mouth 2 (two) times daily.   Yes Historical Provider, MD  vitamin B-12 (CYANOCOBALAMIN) 500 MCG tablet Take 1,000 mcg by mouth 2 (two) times daily.    Yes Historical Provider, MD  cyclobenzaprine (FLEXERIL) 5 MG tablet Take 1 tablet (5 mg total) by mouth 3 (three)  times daily as needed for muscle spasms. 10/09/12   Terressa Koyanagi, DO   Physical Exam: Filed Vitals:   05/13/13 1620  BP: 149/113  Pulse:   Temp:   Resp: 13    BP 149/113  Pulse 91  Temp(Src) 97.9 F (36.6 C)  Resp 13  Wt 57.607 kg (127 lb)  SpO2 99%  BP 141/54  Pulse 59  Temp(Src) 97.9 F (36.6 C) (Oral)  Resp 14  Wt 57.607 kg (127 lb)  SpO2 99%  General Appearance:    Alert, cooperative, no distress, appears stated age  Head:    Normocephalic, without obvious abnormality, atraumatic  Eyes:    PERRL, conjunctiva/corneas clear, EOM's intact, fundi    benign, both eyes     Nose:   Nares normal, septum midline, mucosa normal, no drainage    or sinus  tenderness  Throat:   Lips, mucosa, and tongue normal; teeth and gums normal  Neck:   Supple, symmetrical, trachea midline, no adenopathy;    thyroid:  no enlargement/tenderness/nodules; no carotid   bruit or JVD  Back:     Symmetric, no curvature, ROM normal, no CVA tenderness  Lungs:     Clear to auscultation bilaterally, respirations unlabored  Chest Wall:    No tenderness or deformity   Heart:    Regular rate and rhythm, S1 and S2 normal, no murmur, rub   or gallop     Abdomen:     Soft, non-tender, bowel sounds active all four quadrants,    no masses, no organomegaly  Genitalia:  deferred  Rectal:   deferred  Extremities:   Extremities normal, atraumatic, no cyanosis or edema  Pulses:   2+ and symmetric all extremities  Skin:   Skin color, texture, turgor normal, no rashes or lesions  Lymph nodes:   Cervical, supraclavicular, and axillary nodes normal  Neurologic:   CNII-XII intact, normal strength, sensation and reflexes    throughout             Psych: normal affect.    Labs on Admission:  Basic Metabolic Panel:  Recent Labs Lab 05/13/13 1423  NA 138  K 4.4  CL 104  CO2 22  GLUCOSE 110*  BUN 22  CREATININE 0.87  CALCIUM 9.8   Liver Function Tests:  Recent Labs Lab 05/13/13 1423  AST 19  ALT 12  ALKPHOS 51  BILITOT 0.1*  PROT 7.4  ALBUMIN 4.1   No results found for this basename: LIPASE, AMYLASE,  in the last 168 hours No results found for this basename: AMMONIA,  in the last 168 hours CBC:  Recent Labs Lab 05/13/13 1423  WBC 7.2  HGB 12.9  HCT 37.9  MCV 94.3  PLT 337   Cardiac Enzymes: No results found for this basename: CKTOTAL, CKMB, CKMBINDEX, TROPONINI,  in the last 168 hours  BNP (last 3 results) No results found for this basename: PROBNP,  in the last 8760 hours CBG: No results found for this basename: GLUCAP,  in the last 168 hours  Radiological Exams on Admission: Dg Chest 2 View  05/13/2013   CLINICAL DATA:  Chest pain.   EXAM: CHEST  2 VIEW  COMPARISON:  09/23/2010.  FINDINGS: The cardiac silhouette, mediastinal and hilar contours are within normal limits and stable. The lungs are clear. No pleural effusion. The bony thorax is intact.  IMPRESSION: No acute cardiopulmonary findings.   Electronically Signed   By: Loralie Champagne M.D.   On: 05/13/2013 15:48  EKG: Normal sinus rhythm  Assessment/Plan Principal Problem:   Chest tightness or pressure: Atypical for cardiac, but will observe overnight on telemetry to rule out MI. Will also get d-dimer and if positive consider CT angiogram of the chest. Active Problems:   HYPOTHYROIDISM: Check TSH   HYPERTENSION: Blood pressure currently quite high. Will resume home medications and give labetalol IV as needed.   MITRAL VALVE PROLAPSE, HX OF  Code Status: full Family Communication: daughter at bedside Disposition Plan: home  Time spent: 60 min  Maleia Weems L [facility] Pager [phone]

## 2013-05-14 DIAGNOSIS — R079 Chest pain, unspecified: Secondary | ICD-10-CM

## 2013-05-14 LAB — TROPONIN I: Troponin I: <0.3 ng/mL (ref <0.30)

## 2013-05-14 MED ORDER — PANTOPRAZOLE SODIUM 40 MG PO TBEC
40.0000 mg | DELAYED_RELEASE_TABLET | Freq: Every day | ORAL | Status: DC
  Administered 2013-05-14: 40 mg via ORAL

## 2013-05-14 MED ORDER — OMEPRAZOLE 40 MG PO CPDR: 40.0000 mg | DELAYED_RELEASE_CAPSULE | Freq: Every day | ORAL | Status: DC

## 2013-05-14 MED ORDER — ASPIRIN 81 MG PO CHEW: 81.0000 mg | CHEWABLE_TABLET | Freq: Every day | ORAL | Status: DC

## 2013-05-14 NOTE — Progress Notes (Signed)
D/c orders received;IV removed with gauze on, pt remains in stable condition, pt meds and instructions reviewed and given to pt; pt d/c to home 

## 2013-05-14 NOTE — Discharge Summary (Signed)
Physician Discharge Summary  Sara Medina WUJ:[id number] DOB: 09/03/1929 DOA: 05/13/2013  PCP: Carrie Mew, MD  Admit date: 05/13/2013 Discharge date: 05/14/2013  Time spent: 30 minutes  Recommendations for Outpatient Follow-up:  1. Need referral to cardiology for out patient stress test.  2. Need repeat TSH and free t4 , T 3.   Discharge Diagnoses:    Chest tightness or pressure   HYPOTHYROIDISM   HYPERTENSION   MITRAL VALVE PROLAPSE, HX OF   Discharge Condition: Stable.   Diet recommendation: Heart Healthy.   Filed Weights   05/13/13 1420 05/14/13 0543  Weight: 57.607 kg (127 lb) 57.153 kg (126 lb)    History of present illness:  Sara Medina is a 79 y.o. female presents to the emergency room with chest tightness, rather diffuse. It started while sitting at a table writing Christmas cards. She also feels slightly short of breath, like she can't take a deep breath. She is very active and has no problems walking 3 miles a day. She had a stress test out of town 5-10 years ago that was reportedly normal. She has a history of acid reflux, but this does not feel like her typical reflux symptoms. She's had no wheezing. She has a chronic cough from chronic postnasal drip. She has had no fevers or chills. No palpitations. EKG and troponin in the emergency room are normal. Chest x-ray unremarkable. The pain at its worst was 3/10. Currently 1-2/10. Relieved by sublingual nitroglycerin. She takes low-dose aspirin daily.   Hospital Course:  1-Chest pressure: Resolved. Patient was admitted to telemetry. No arrythmia on telemetry monitor. Troponin times 3 negative. Chest pain has resolved. Patient has not been taking her nexium for a while. Pain resolved after GI cocktail. Will provide prescription for omeprazole. Chest x ray negative. D dimer negative.   Procedures:  none  Consultations:  None  Discharge Exam: Filed Vitals:   05/14/13 0926  BP: 117/66  Pulse: 77  Temp:    Resp:     General: No distress.  Cardiovascular: S 1, S 2 RRR Respiratory: CTA  Discharge Instructions  Discharge Orders   Future Appointments Provider Department Dept Phone   09/17/2013 8:30 AM [facility] [place] [phone]   09/23/2013 9:30 AM Stacie Glaze, MD [facility] [place] [phone]   Future Orders Complete By Expires   Diet - low sodium heart healthy  As directed    Increase activity slowly  As directed        Medication List         aspirin 81 MG tablet  Take 81 mg by mouth daily.     calcium gluconate 500 MG tablet  Take 500 mg by mouth daily.     cyclobenzaprine 5 MG tablet  Commonly known as:  FLEXERIL  Take 1 tablet (5 mg total) by mouth 3 (three) times daily as needed for muscle spasms.     estradiol 0.5 MG tablet  Commonly known as:  ESTRACE  Take 0.5 mg by mouth daily.     folic acid 400 MCG tablet  Commonly known as:  FOLVITE  Take 400 mcg by mouth daily.     gemfibrozil 600 MG tablet  Commonly known as:  LOPID  Take 1 tablet (600 mg total) by mouth 2 (two) times daily before a meal.     glucosamine-chondroitin 500-400 MG tablet  Take 3 tablets by mouth daily.     levothyroxine 25 MCG tablet  Commonly known as:  SYNTHROID,  LEVOTHROID  Take 1 tablet (25 mcg total) by mouth daily before breakfast.     metoprolol tartrate 25 MG tablet  Commonly known as:  LOPRESSOR  Take 12.5 mg by mouth 2 (two) times daily.     multivitamin, stress formula tablet  Take 1 tablet by mouth daily.     omeprazole 40 MG capsule  Commonly known as:  PRILOSEC  Take 1 capsule (40 mg total) by mouth daily.     vitamin B-12 500 MCG tablet  Commonly known as:  CYANOCOBALAMIN  Take 1,000 mcg by mouth 2 (two) times daily.       Allergies  Allergen Reactions  . Peanut-Containing Drug Products   . Penicillins        Follow-up Information   Follow up with Carrie Mew, MD In 2 days.   Specialty:  Internal  Medicine   Contact information:   [address] [place] [address] [phone]        The results of significant diagnostics from this hospitalization (including imaging, microbiology, ancillary and laboratory) are listed below for reference.    Significant Diagnostic Studies: Dg Chest 2 View  05/13/2013   CLINICAL DATA:  Chest pain.  EXAM: CHEST  2 VIEW  COMPARISON:  09/23/2010.  FINDINGS: The cardiac silhouette, mediastinal and hilar contours are within normal limits and stable. The lungs are clear. No pleural effusion. The bony thorax is intact.  IMPRESSION: No acute cardiopulmonary findings.   Electronically Signed   By: Loralie Champagne M.D.   On: 05/13/2013 15:48    Microbiology: No results found for this or any previous visit (from the past 240 hour(s)).   Labs: Basic Metabolic Panel:  Recent Labs Lab 05/13/13 1423  NA 138  K 4.4  CL 104  CO2 22  GLUCOSE 110*  BUN 22  CREATININE 0.87  CALCIUM 9.8   Liver Function Tests:  Recent Labs Lab 05/13/13 1423  AST 19  ALT 12  ALKPHOS 51  BILITOT 0.1*  PROT 7.4  ALBUMIN 4.1   No results found for this basename: LIPASE, AMYLASE,  in the last 168 hours No results found for this basename: AMMONIA,  in the last 168 hours CBC:  Recent Labs Lab 05/13/13 1423  WBC 7.2  HGB 12.9  HCT 37.9  MCV 94.3  PLT 337   Cardiac Enzymes:  Recent Labs Lab 05/13/13 1648 05/13/13 2220 05/14/13 0350  TROPONINI <0.30 <0.30 <0.30   BNP: BNP (last 3 results) No results found for this basename: PROBNP,  in the last 8760 hours CBG: No results found for this basename: GLUCAP,  in the last 168 hours     Signed:  Carinna Newhart  [facility] 05/14/2013, 9:47 AM

## 2013-05-15 DIAGNOSIS — M999 Biomechanical lesion, unspecified: Secondary | ICD-10-CM | POA: Diagnosis not present

## 2013-05-15 DIAGNOSIS — M5430 Sciatica, unspecified side: Secondary | ICD-10-CM | POA: Diagnosis not present

## 2013-05-15 DIAGNOSIS — M5106 Intervertebral disc disorders with myelopathy, lumbar region: Secondary | ICD-10-CM | POA: Diagnosis not present

## 2013-05-15 DIAGNOSIS — M5137 Other intervertebral disc degeneration, lumbosacral region: Secondary | ICD-10-CM | POA: Diagnosis not present

## 2013-05-28 DIAGNOSIS — M5137 Other intervertebral disc degeneration, lumbosacral region: Secondary | ICD-10-CM | POA: Diagnosis not present

## 2013-05-28 DIAGNOSIS — M5106 Intervertebral disc disorders with myelopathy, lumbar region: Secondary | ICD-10-CM | POA: Diagnosis not present

## 2013-05-28 DIAGNOSIS — M999 Biomechanical lesion, unspecified: Secondary | ICD-10-CM | POA: Diagnosis not present

## 2013-05-28 DIAGNOSIS — M5430 Sciatica, unspecified side: Secondary | ICD-10-CM | POA: Diagnosis not present

## 2013-06-11 ENCOUNTER — Encounter: Payer: Self-pay | Admitting: Family

## 2013-06-11 ENCOUNTER — Ambulatory Visit (INDEPENDENT_AMBULATORY_CARE_PROVIDER_SITE_OTHER): Payer: Medicare Other | Admitting: Family

## 2013-06-11 VITALS — BP 158/90 | HR 76 | Wt 131.0 lb

## 2013-06-11 DIAGNOSIS — E039 Hypothyroidism, unspecified: Secondary | ICD-10-CM | POA: Diagnosis not present

## 2013-06-11 DIAGNOSIS — M5430 Sciatica, unspecified side: Secondary | ICD-10-CM | POA: Diagnosis not present

## 2013-06-11 DIAGNOSIS — E7800 Pure hypercholesterolemia, unspecified: Secondary | ICD-10-CM

## 2013-06-11 DIAGNOSIS — K219 Gastro-esophageal reflux disease without esophagitis: Secondary | ICD-10-CM | POA: Diagnosis not present

## 2013-06-11 DIAGNOSIS — M999 Biomechanical lesion, unspecified: Secondary | ICD-10-CM | POA: Diagnosis not present

## 2013-06-11 DIAGNOSIS — M5137 Other intervertebral disc degeneration, lumbosacral region: Secondary | ICD-10-CM | POA: Diagnosis not present

## 2013-06-11 DIAGNOSIS — M5106 Intervertebral disc disorders with myelopathy, lumbar region: Secondary | ICD-10-CM | POA: Diagnosis not present

## 2013-06-11 LAB — BASIC METABOLIC PANEL
BUN: 18 mg/dL (ref 6–23)
CO2: 23 mEq/L (ref 19–32)
CREATININE: 1 mg/dL (ref 0.4–1.2)
Calcium: 9.3 mg/dL (ref 8.4–10.5)
Chloride: 111 mEq/L (ref 96–112)
GFR: 59.95 mL/min — AB (ref >60.00)
GLUCOSE: 87 mg/dL (ref 70–99)
POTASSIUM: 4 meq/L (ref 3.5–5.1)
Sodium: 141 mEq/L (ref 135–145)

## 2013-06-11 LAB — TSH: TSH: 2.25 u[IU]/mL (ref 0.35–5.50)

## 2013-06-11 MED ORDER — OMEPRAZOLE 40 MG PO CPDR: 40.0000 mg | DELAYED_RELEASE_CAPSULE | Freq: Every day | ORAL | Status: DC

## 2013-06-11 NOTE — Progress Notes (Signed)
Subjective:    Patient ID: Sara Medina, female    DOB: 06-14-1929, 80 y.o.   MRN: [id number]  HPI 80 year old white female, nonsmoker is in the emergency department followup from 05/13/2013. She presented to the emergency department with chest discomfort. She had cardiac enzymes drawn that were negative. Patient reports responding well to a GI cocktail. He did not respond to nitroglycerin. She has a history of GERD. Currently on omeprazole since discharge from the emergency department that is held. No more chest pain has occurred. He denies any increase in belching or burping. She was found to have a TSH that was slightly abnormal.   Review of Systems  Constitutional: Negative.   HENT: Negative.   Respiratory: Negative.   Cardiovascular: Negative.   Gastrointestinal: Negative.   Genitourinary: Negative.   Musculoskeletal: Negative.   Skin: Negative.   Neurological: Negative.   Hematological: Negative.   Psychiatric/Behavioral: Negative.    Past Medical History  Diagnosis Date  . Allergy   . Asthma   . Diverticulosis   . History of diverticulitis of colon   . GERD (gastroesophageal reflux disease)   . Arthritis   . Hyperlipidemia   . Hypertension   . Thyroid disease     History   Social History  . Marital Status: Widowed    Spouse Name: N/A    Number of Children: N/A  . Years of Education: N/A   Occupational History  . retired    Social History Main Topics  . Smoking status: Never Smoker   . Smokeless tobacco: Never Used  . Alcohol Use: No  . Drug Use: No  . Sexual Activity: Not Currently   Other Topics Concern  . Not on file   Social History Narrative  . No narrative on file    Past Surgical History  Procedure Laterality Date  . Trigger thumb    . Abdominal hysterectomy    . Tonsillectomy      Family History  Problem Relation Age of Onset  . Ovarian cancer    . Cancer Mother     ovarian  . Heart disease Father   . Stroke Father   . Cancer  Sister     brain    Allergies  Allergen Reactions  . Peanut-Containing Drug Products   . Penicillins     Current Outpatient Prescriptions on File Prior to Visit  Medication Sig Dispense Refill  . aspirin 81 MG tablet Take 81 mg by mouth daily.        . B Complex-C-Folic Acid (MULTIVITAMIN, STRESS FORMULA) tablet Take 1 tablet by mouth daily.        . calcium gluconate 500 MG tablet Take 500 mg by mouth daily.        . cyclobenzaprine (FLEXERIL) 5 MG tablet Take 1 tablet (5 mg total) by mouth 3 (three) times daily as needed for muscle spasms.  15 tablet  0  . estradiol (ESTRACE) 0.5 MG tablet Take 0.5 mg by mouth daily.      . folic acid (FOLVITE) 937 MCG tablet Take 400 mcg by mouth daily.        Marland Kitchen gemfibrozil (LOPID) 600 MG tablet Take 1 tablet (600 mg total) by mouth 2 (two) times daily before a meal.  60 tablet  11  . glucosamine-chondroitin 500-400 MG tablet Take 3 tablets by mouth daily.       Marland Kitchen levothyroxine (SYNTHROID, LEVOTHROID) 25 MCG tablet Take 1 tablet (25 mcg total) by mouth daily before breakfast.  90 tablet  3  . metoprolol tartrate (LOPRESSOR) 25 MG tablet Take 12.5 mg by mouth 2 (two) times daily.      . vitamin B-12 (CYANOCOBALAMIN) 500 MCG tablet Take 1,000 mcg by mouth 2 (two) times daily.        No current facility-administered medications on file prior to visit.    BP 158/90  Pulse 76  Wt 131 lb (59.421 kg)chart    Objective:   Physical Exam  Constitutional: She is oriented to person, place, and time. She appears well-developed and well-nourished.  HENT:  Right Ear: External ear normal.  Left Ear: External ear normal.  Nose: Nose normal.  Mouth/Throat: Oropharynx is clear and moist.  Neck: Normal range of motion. Neck supple.  Cardiovascular: Normal rate, regular rhythm and normal heart sounds.   Pulmonary/Chest: Effort normal and breath sounds normal.  Abdominal: Soft. Bowel sounds are normal.  Musculoskeletal: Normal range of motion.  Neurological:  She is alert and oriented to person, place, and time.  Skin: Skin is warm and dry.  Psychiatric: She has a normal mood and affect.          Assessment & Plan:  Assessment: 1. Chest pain-resolved 2. GERD-improved 3. Hypothyroidism-uncontrolled  Plan: TSH sent today. Notify patient of results. Renew prescription for all members all 40 mg once daily. Call the office with any questions or concerns. Recheck as scheduled and sooner as needed.

## 2013-06-11 NOTE — Patient Instructions (Signed)

## 2013-08-02 ENCOUNTER — Encounter: Payer: Self-pay | Admitting: Internal Medicine

## 2013-09-01 ENCOUNTER — Other Ambulatory Visit: Payer: Self-pay | Admitting: Family

## 2013-09-02 ENCOUNTER — Other Ambulatory Visit: Payer: Self-pay

## 2013-09-02 DIAGNOSIS — Z1231 Encounter for screening mammogram for malignant neoplasm of breast: Secondary | ICD-10-CM

## 2013-09-17 ENCOUNTER — Other Ambulatory Visit (INDEPENDENT_AMBULATORY_CARE_PROVIDER_SITE_OTHER): Payer: Medicare Other

## 2013-09-17 DIAGNOSIS — E785 Hyperlipidemia, unspecified: Secondary | ICD-10-CM

## 2013-09-17 LAB — HEPATIC FUNCTION PANEL
ALK PHOS: 42 U/L (ref 39–117)
ALT: 15 U/L (ref 0–35)
AST: 21 U/L (ref 0–37)
Albumin: 4 g/dL (ref 3.5–5.2)
BILIRUBIN DIRECT: 0 mg/dL (ref 0.0–0.3)
BILIRUBIN TOTAL: 0.6 mg/dL (ref 0.3–1.2)
TOTAL PROTEIN: 7.2 g/dL (ref 6.0–8.3)

## 2013-09-17 LAB — LIPID PANEL
CHOLESTEROL: 172 mg/dL (ref 0–200)
HDL: 61.6 mg/dL (ref >39.00)
LDL CALC: 94 mg/dL (ref 0–99)
Total CHOL/HDL Ratio: 3
Triglycerides: 84 mg/dL (ref 0.0–149.0)
VLDL: 16.8 mg/dL (ref 0.0–40.0)

## 2013-09-23 ENCOUNTER — Encounter: Payer: Self-pay | Admitting: Internal Medicine

## 2013-09-23 ENCOUNTER — Ambulatory Visit (INDEPENDENT_AMBULATORY_CARE_PROVIDER_SITE_OTHER): Payer: Medicare Other | Admitting: Internal Medicine

## 2013-09-23 ENCOUNTER — Telehealth: Payer: Self-pay | Admitting: Internal Medicine

## 2013-09-23 VITALS — BP 130/80 | HR 68 | Temp 97.3°F | Wt 132.0 lb

## 2013-09-23 DIAGNOSIS — R002 Palpitations: Secondary | ICD-10-CM

## 2013-09-23 DIAGNOSIS — M549 Dorsalgia, unspecified: Secondary | ICD-10-CM | POA: Diagnosis not present

## 2013-09-23 DIAGNOSIS — E039 Hypothyroidism, unspecified: Secondary | ICD-10-CM | POA: Diagnosis not present

## 2013-09-23 MED ORDER — LEVOTHYROXINE SODIUM 25 MCG PO TABS: 25.0000 ug | ORAL_TABLET | Freq: Every day | ORAL | Status: DC

## 2013-09-23 MED ORDER — ESTRADIOL 0.5 MG PO TABS: 0.5000 mg | ORAL_TABLET | Freq: Every day | ORAL | Status: DC

## 2013-09-23 MED ORDER — METOPROLOL TARTRATE 25 MG PO TABS: 25.0000 mg | ORAL_TABLET | Freq: Two times a day (BID) | ORAL | Status: DC

## 2013-09-23 MED ORDER — CYCLOBENZAPRINE HCL 5 MG PO TABS: 5.0000 mg | ORAL_TABLET | Freq: Three times a day (TID) | ORAL | Status: DC | PRN

## 2013-09-23 NOTE — Telephone Encounter (Signed)
OK to transfer anyone Dr. Arnoldo Morale recommends.

## 2013-09-23 NOTE — Progress Notes (Signed)
Subjective:    Patient ID: Sara Medina, female    DOB: [birth year], 80 y.o.   MRN: [id number]  HPI HTN follow up Mild exercise induced asthma Wants to get off Prilosec due to possible side effects Has loose stools Worsening neuropathy and leg cramps Mild hypothyroidism Colons stop at 80! Stool cards today    Review of Systems  Constitutional: Negative for activity change, appetite change and fatigue.  HENT: Negative for congestion, ear pain, postnasal drip and sinus pressure.   Eyes: Negative for redness and visual disturbance.  Respiratory: Negative for cough, shortness of breath and wheezing.   Gastrointestinal: Negative for abdominal pain and abdominal distention.  Genitourinary: Negative for dysuria, frequency and menstrual problem.  Musculoskeletal: Negative for arthralgias, joint swelling, myalgias and neck pain.  Skin: Negative for rash and wound.  Neurological: Negative for dizziness, weakness and headaches.  Hematological: Negative for adenopathy. Does not bruise/bleed easily.  Psychiatric/Behavioral: Negative for sleep disturbance and decreased concentration.   Past Medical History  Diagnosis Date  . Allergy   . Asthma   . Diverticulosis   . History of diverticulitis of colon   . GERD (gastroesophageal reflux disease)   . Arthritis   . Hyperlipidemia   . Hypertension   . Thyroid disease     History   Social History  . Marital Status: Widowed    Spouse Name: N/A    Number of Children: N/A  . Years of Education: N/A   Occupational History  . retired    Social History Main Topics  . Smoking status: Never Smoker   . Smokeless tobacco: Never Used  . Alcohol Use: No  . Drug Use: No  . Sexual Activity: Not Currently   Other Topics Concern  . Not on file   Social History Narrative  . No narrative on file    Past Surgical History  Procedure Laterality Date  . Trigger thumb    . Abdominal hysterectomy    . Tonsillectomy      Family History    Problem Relation Age of Onset  . Ovarian cancer    . Cancer Mother     ovarian  . Heart disease Father   . Stroke Father   . Cancer Sister     brain    Allergies  Allergen Reactions  . Peanut-Containing Drug Products   . Penicillins     Current Outpatient Prescriptions on File Prior to Visit  Medication Sig Dispense Refill  . aspirin 81 MG tablet Take 81 mg by mouth daily.        . calcium gluconate 500 MG tablet Take 500 mg by mouth daily.        . cyclobenzaprine (FLEXERIL) 5 MG tablet Take 1 tablet (5 mg total) by mouth 3 (three) times daily as needed for muscle spasms.  15 tablet  0  . estradiol (ESTRACE) 0.5 MG tablet Take 0.5 mg by mouth daily.      . folic acid (FOLVITE) 035 MCG tablet Take 400 mcg by mouth daily.        Marland Kitchen gemfibrozil (LOPID) 600 MG tablet Take 1 tablet (600 mg total) by mouth 2 (two) times daily before a meal.  60 tablet  11  . glucosamine-chondroitin 500-400 MG tablet Take 3 tablets by mouth daily.       Marland Kitchen levothyroxine (SYNTHROID, LEVOTHROID) 25 MCG tablet Take 1 tablet (25 mcg total) by mouth daily before breakfast.  90 tablet  3  . metoprolol tartrate (LOPRESSOR) 25  MG tablet Take 25 mg by mouth 2 (two) times daily.       Marland Kitchen omeprazole (PRILOSEC) 40 MG capsule TAKE ONE CAPSULE BY MOUTH DAILY  90 capsule  1  . vitamin B-12 (CYANOCOBALAMIN) 500 MCG tablet Take 1,000 mcg by mouth 2 (two) times daily.        No current facility-administered medications on file prior to visit.    BP 154/90  Pulse 68  Temp(Src) 97.3 F (36.3 C) (Oral)  Wt 132 lb (59.875 kg)       Objective:   Physical Exam  Constitutional: She is oriented to person, place, and time. She appears well-developed and well-nourished. No distress.  HENT:  Head: Normocephalic and atraumatic.  Right Ear: External ear normal.  Eyes: Conjunctivae and EOM are normal. Pupils are equal, round, and reactive to light.  Neck: Normal range of motion. Neck supple. No JVD present. No tracheal  deviation present. No thyromegaly present.  Cardiovascular: Normal rate, regular rhythm and intact distal pulses.   Murmur heard. Pulmonary/Chest: Effort normal and breath sounds normal. She has no wheezes. She exhibits no tenderness.  Abdominal: Soft. Bowel sounds are normal.  Musculoskeletal: She exhibits tenderness. She exhibits no edema.  Lymphadenopathy:    She has no cervical adenopathy.  Neurological: She is alert and oriented to person, place, and time. She has normal reflexes. No cranial nerve deficit.  Skin: Skin is warm and dry. She is not diaphoretic.          Assessment & Plan:  HTN  Brings home readings and she had "white coat" elevation in pulse and pressure  Episodic gerd and spasm that have resulted in ER visits and negative CAD work ups  Suggest gaviscon first and see to the chest tightness resolved quickly  Stop the prilosec.  Great lipid results on lopid

## 2013-09-23 NOTE — Patient Instructions (Addendum)
When you get the chest discomfort that is probably GI Try liguid gaviscon first and if the symptoms quickly stop you are safe to remain at home!  If you start to have increased reflux symptoms off the Prilosec start "pepcid complete" at bed time  As needed

## 2013-09-23 NOTE — Telephone Encounter (Signed)
Pt is needing an appt soon, and wants to switch pcp to Sara Medina, pt states dr. Arnoldo Morale wants her to see Sara Medina going forward. Ok to transfer?

## 2013-09-24 DIAGNOSIS — M5430 Sciatica, unspecified side: Secondary | ICD-10-CM | POA: Diagnosis not present

## 2013-09-24 DIAGNOSIS — M5137 Other intervertebral disc degeneration, lumbosacral region: Secondary | ICD-10-CM | POA: Diagnosis not present

## 2013-09-24 DIAGNOSIS — M5106 Intervertebral disc disorders with myelopathy, lumbar region: Secondary | ICD-10-CM | POA: Diagnosis not present

## 2013-09-24 DIAGNOSIS — M999 Biomechanical lesion, unspecified: Secondary | ICD-10-CM | POA: Diagnosis not present

## 2013-09-25 DIAGNOSIS — H26499 Other secondary cataract, unspecified eye: Secondary | ICD-10-CM | POA: Diagnosis not present

## 2013-09-25 DIAGNOSIS — H43399 Other vitreous opacities, unspecified eye: Secondary | ICD-10-CM | POA: Diagnosis not present

## 2013-09-25 NOTE — Telephone Encounter (Signed)
Pt has been sch

## 2013-10-01 DIAGNOSIS — M5137 Other intervertebral disc degeneration, lumbosacral region: Secondary | ICD-10-CM | POA: Diagnosis not present

## 2013-10-01 DIAGNOSIS — M5106 Intervertebral disc disorders with myelopathy, lumbar region: Secondary | ICD-10-CM | POA: Diagnosis not present

## 2013-10-01 DIAGNOSIS — M999 Biomechanical lesion, unspecified: Secondary | ICD-10-CM | POA: Diagnosis not present

## 2013-10-01 DIAGNOSIS — M5430 Sciatica, unspecified side: Secondary | ICD-10-CM | POA: Diagnosis not present

## 2013-10-04 ENCOUNTER — Ambulatory Visit
Admission: RE | Admit: 2013-10-04 | Discharge: 2013-10-04 | Disposition: A | Discharge status: Home / Self Care | Payer: Medicare Other | Source: Ambulatory Visit

## 2013-10-04 DIAGNOSIS — Z1231 Encounter for screening mammogram for malignant neoplasm of breast: Secondary | ICD-10-CM

## 2013-10-08 ENCOUNTER — Ambulatory Visit (INDEPENDENT_AMBULATORY_CARE_PROVIDER_SITE_OTHER): Payer: Medicare Other | Admitting: Family

## 2013-10-08 ENCOUNTER — Encounter: Payer: Self-pay | Admitting: Family

## 2013-10-08 VITALS — BP 140/80 | HR 67 | Temp 97.7°F | Ht 64.0 in | Wt 132.0 lb

## 2013-10-08 DIAGNOSIS — J45990 Exercise induced bronchospasm: Secondary | ICD-10-CM

## 2013-10-08 MED ORDER — ALBUTEROL SULFATE HFA 108 (90 BASE) MCG/ACT IN AERS: 2.0000 | INHALATION_SPRAY | Freq: Four times a day (QID) | RESPIRATORY_TRACT | Status: DC | PRN

## 2013-10-08 NOTE — Progress Notes (Signed)
Pre visit review using our clinic review tool, if applicable. No additional management support is needed unless otherwise documented below in the visit note. 

## 2013-10-08 NOTE — Patient Instructions (Signed)
Exercise-Induced Asthma   Asthma is a condition in which the airways in the lungs (bronchioles) tend to constrict more than normal due to muscle spasms. This constriction results in difficulty in breathing (shortness of breath, wheezing, or coughing). For some people the symptoms are caused or triggered by physical activity; this is known as exercise-induced asthma.  SYMPTOMS   · Shortness of breath.  · Wheezing.  · Coughing.  · Chest tightness.  · Decrease in optimal performance.  · Fatigue.  POSSIBLE TRIGGERS:  Exercise-induced asthma may occur more often when one or more of the following are present:   · Animal dander from the skin, hair, or feathers of animals.  · Dust mites contained in house dust.  · Cockroaches.  · Pollen from trees or grass.  · Mold.  · Cigarette or tobacco smoke. Smoking cannot be allowed in homes of people with asthma. People with asthma should not smoke and should not be around smokers.  · Air pollutants such as dust, household cleaners, hair sprays, aerosol sprays, paint fumes, strong chemicals, or strong odors.  · Cold air or weather changes. Cold air may cause inflammation. Winds increase molds and pollens in the air. There is not one best climate for people with asthma.  · Strong emotions such as crying or laughing hard.  · Stress.  · Certain medicines such as aspirin or beta-blockers.  · Sulfites in such foods and drinks as dried fruits and wine.  · Infections or inflammatory conditions such as the flu, a cold, or an inflammation of the nasal membranes (rhinitis).  · Gastroesophageal reflux disease (GERD). GERD is a condition where stomach acid backs up into your throat (esophagus).  · Exercise or strenous activity. Proper pre-exercise medicines allow most people to participate in sports.  PREVENTION   · Know the triggers that may increase your occurrence for exercise-induced asthma and avoid them.  · During winter you may need to exercise indoors or wear a mask if you do exercise  outdoors.  · Breathing through the nose instead of the mouth, especially in the winter.  · Warm-up for an appropriate length of time before a vigorous workout.  · Take controller and reliever medicines to control your asthma as directed.  · Follow-up with your caregiver as directed.  TREATMENT   Asthma controller and reliever medicines work well for most people suffering from exercise-induced asthma. Medicines are able to both prevent asthma attack, as well as treat attacks already happening. The most common type of medicine for asthma is called a bronchodilator. Bronchodilators act by expanding the constricted airways. The most common type of bronchodilator is albuterol and should be taken 15 to 30 minutes before physical activity and as soon as symptoms begin to appear. Additional medicines, such as cromolyn and nedocromil, may be prescribed by your caregiver. It is important for all people with asthma to use their medicines as directed by their caregiver.  Document Released: 05/16/2005 Document Revised: 05/02/2012 Document Reviewed: 08/28/2008  ExitCare® Patient Information ©2014 [employer].

## 2013-10-08 NOTE — Progress Notes (Signed)
Subjective:    Patient ID: Sara Medina, female    DOB: 1929/10/30, 80 y.o.   MRN: [id number]  HPI 80 year old caucasian female, nonsmoker presents today with issues after exercising. She complains of feeling tightness in her chest, shortness of breath, and panting.  These symptoms began about two months ago while she was doing [place].  The symptoms lasted for about 15 min.  They got better with rest and an inhaler.  She has had cardiac workup that was negative.  She also had a stress test 8-10 years ago that diagnosed her with exercise induced asthma. She used Proventil at that time and it worked well.  She also is complaints of post nasal drip.        Review of Systems  Constitutional: Negative.   HENT: Negative for congestion.        Postnasal drip  Respiratory: Positive for chest tightness.        Chest tightness and cough after exercising  Cardiovascular: Negative.   Allergic/Immunologic: Positive for environmental allergies.       She has seasonal allergies   Past Medical History  Diagnosis Date  . Allergy   . Asthma   . Diverticulosis   . History of diverticulitis of colon   . GERD (gastroesophageal reflux disease)   . Arthritis   . Hyperlipidemia   . Hypertension   . Thyroid disease     History   Social History  . Marital Status: Widowed    Spouse Name: N/A    Number of Children: N/A  . Years of Education: N/A   Occupational History  . retired    Social History Main Topics  . Smoking status: Never Smoker   . Smokeless tobacco: Never Used  . Alcohol Use: No  . Drug Use: No  . Sexual Activity: Not Currently   Other Topics Concern  . Not on file   Social History Narrative  . No narrative on file    Past Surgical History  Procedure Laterality Date  . Trigger thumb    . Abdominal hysterectomy    . Tonsillectomy      Family History  Problem Relation Age of Onset  . Ovarian cancer    . Cancer Mother     ovarian  . Heart disease Father   .  Stroke Father   . Cancer Sister     brain    Allergies  Allergen Reactions  . Peanut-Containing Drug Products   . Penicillins     Current Outpatient Prescriptions on File Prior to Visit  Medication Sig Dispense Refill  . aspirin 81 MG tablet Take 81 mg by mouth daily.        . calcium gluconate 500 MG tablet Take 500 mg by mouth daily.        . Cholecalciferol (VITAMIN D-3 PO) Take 1,000 Int'l Units by mouth daily.      . cyclobenzaprine (FLEXERIL) 5 MG tablet Take 1 tablet (5 mg total) by mouth 3 (three) times daily as needed for muscle spasms.  15 tablet  0  . estradiol (ESTRACE) 0.5 MG tablet Take 1 tablet (0.5 mg total) by mouth daily.  90 tablet  11  . folic acid (FOLVITE) 854 MCG tablet Take 400 mcg by mouth daily.        Marland Kitchen gemfibrozil (LOPID) 600 MG tablet Take 1 tablet (600 mg total) by mouth 2 (two) times daily before a meal.  60 tablet  11  . glucosamine-chondroitin  500-400 MG tablet Take 3 tablets by mouth daily.       Marland Kitchen levothyroxine (SYNTHROID, LEVOTHROID) 25 MCG tablet Take 1 tablet (25 mcg total) by mouth daily before breakfast.  90 tablet  3  . MAGNESIUM-ZINC PO Take by mouth.      . metoprolol tartrate (LOPRESSOR) 25 MG tablet Take 1 tablet (25 mg total) by mouth 2 (two) times daily.  180 tablet  11  . naproxen sodium (ALEVE) 220 MG tablet Take 220 mg by mouth 2 (two) times daily as needed.      Marland Kitchen POTASSIUM PO Take 595 mg by mouth daily.      . vitamin B-12 (CYANOCOBALAMIN) 500 MCG tablet Take 1,000 mcg by mouth 2 (two) times daily.        No current facility-administered medications on file prior to visit.    BP 140/80  Pulse 67  Temp(Src) 97.7 F (36.5 C) (Oral)  Ht 5\' 4"  (1.626 m)  Wt 132 lb (59.875 kg)  BMI 22.65 kg/m2  SpO2 98%chart     Objective:   Physical Exam  Constitutional: She is oriented to person, place, and time. She appears well-developed and well-nourished.  Cardiovascular: Normal rate, regular rhythm and normal heart sounds.     Pulmonary/Chest: Effort normal and breath sounds normal. She has no wheezes.  Neurological: She is alert and oriented to person, place, and time. She has normal reflexes.  Skin: Skin is warm and dry.          Assessment & Plan:  Assessment 1. Exercise induced asthma  Plan 1.  Continue with current medications. 2.Proair q6 prn. 3.. Call office with questions or concerns.

## 2013-10-15 DIAGNOSIS — M999 Biomechanical lesion, unspecified: Secondary | ICD-10-CM | POA: Diagnosis not present

## 2013-10-15 DIAGNOSIS — M5106 Intervertebral disc disorders with myelopathy, lumbar region: Secondary | ICD-10-CM | POA: Diagnosis not present

## 2013-10-15 DIAGNOSIS — M5430 Sciatica, unspecified side: Secondary | ICD-10-CM | POA: Diagnosis not present

## 2013-10-15 DIAGNOSIS — M5137 Other intervertebral disc degeneration, lumbosacral region: Secondary | ICD-10-CM | POA: Diagnosis not present

## 2013-10-18 ENCOUNTER — Encounter: Payer: Self-pay | Admitting: Family

## 2013-11-05 DIAGNOSIS — M5137 Other intervertebral disc degeneration, lumbosacral region: Secondary | ICD-10-CM | POA: Diagnosis not present

## 2013-11-05 DIAGNOSIS — M5106 Intervertebral disc disorders with myelopathy, lumbar region: Secondary | ICD-10-CM | POA: Diagnosis not present

## 2013-11-05 DIAGNOSIS — M999 Biomechanical lesion, unspecified: Secondary | ICD-10-CM | POA: Diagnosis not present

## 2013-11-05 DIAGNOSIS — M5430 Sciatica, unspecified side: Secondary | ICD-10-CM | POA: Diagnosis not present

## 2013-11-19 DIAGNOSIS — M999 Biomechanical lesion, unspecified: Secondary | ICD-10-CM | POA: Diagnosis not present

## 2013-11-19 DIAGNOSIS — M5106 Intervertebral disc disorders with myelopathy, lumbar region: Secondary | ICD-10-CM | POA: Diagnosis not present

## 2013-11-19 DIAGNOSIS — M5430 Sciatica, unspecified side: Secondary | ICD-10-CM | POA: Diagnosis not present

## 2013-11-19 DIAGNOSIS — M5137 Other intervertebral disc degeneration, lumbosacral region: Secondary | ICD-10-CM | POA: Diagnosis not present

## 2013-11-21 ENCOUNTER — Encounter: Payer: Self-pay | Admitting: Family

## 2013-11-25 ENCOUNTER — Other Ambulatory Visit (INDEPENDENT_AMBULATORY_CARE_PROVIDER_SITE_OTHER): Payer: Medicare Other

## 2013-11-25 DIAGNOSIS — Z860100 Personal history of colon polyps, unspecified: Secondary | ICD-10-CM

## 2013-11-25 LAB — HEMOCCULT GUIAC POC 1CARD (OFFICE)
Card #2 Fecal Occult Blod, POC: NEGATIVE
Card #3 Fecal Occult Blood, POC: NEGATIVE
Fecal Occult Blood, POC: NEGATIVE

## 2013-11-28 ENCOUNTER — Other Ambulatory Visit: Payer: Self-pay | Admitting: Internal Medicine

## 2013-11-28 ENCOUNTER — Other Ambulatory Visit: Payer: Self-pay | Admitting: Family

## 2013-12-03 ENCOUNTER — Other Ambulatory Visit: Payer: Self-pay | Admitting: Internal Medicine

## 2013-12-03 DIAGNOSIS — M5137 Other intervertebral disc degeneration, lumbosacral region: Secondary | ICD-10-CM | POA: Diagnosis not present

## 2013-12-03 DIAGNOSIS — M999 Biomechanical lesion, unspecified: Secondary | ICD-10-CM | POA: Diagnosis not present

## 2013-12-03 DIAGNOSIS — M5430 Sciatica, unspecified side: Secondary | ICD-10-CM | POA: Diagnosis not present

## 2013-12-03 DIAGNOSIS — M5106 Intervertebral disc disorders with myelopathy, lumbar region: Secondary | ICD-10-CM | POA: Diagnosis not present

## 2013-12-31 DIAGNOSIS — M5106 Intervertebral disc disorders with myelopathy, lumbar region: Secondary | ICD-10-CM | POA: Diagnosis not present

## 2013-12-31 DIAGNOSIS — M999 Biomechanical lesion, unspecified: Secondary | ICD-10-CM | POA: Diagnosis not present

## 2013-12-31 DIAGNOSIS — M5137 Other intervertebral disc degeneration, lumbosacral region: Secondary | ICD-10-CM | POA: Diagnosis not present

## 2013-12-31 DIAGNOSIS — M5430 Sciatica, unspecified side: Secondary | ICD-10-CM | POA: Diagnosis not present

## 2014-01-06 ENCOUNTER — Other Ambulatory Visit: Payer: Self-pay | Admitting: Family

## 2014-01-14 DIAGNOSIS — M999 Biomechanical lesion, unspecified: Secondary | ICD-10-CM | POA: Diagnosis not present

## 2014-01-14 DIAGNOSIS — M5430 Sciatica, unspecified side: Secondary | ICD-10-CM | POA: Diagnosis not present

## 2014-01-14 DIAGNOSIS — M5106 Intervertebral disc disorders with myelopathy, lumbar region: Secondary | ICD-10-CM | POA: Diagnosis not present

## 2014-01-14 DIAGNOSIS — M5137 Other intervertebral disc degeneration, lumbosacral region: Secondary | ICD-10-CM | POA: Diagnosis not present

## 2014-01-28 DIAGNOSIS — M999 Biomechanical lesion, unspecified: Secondary | ICD-10-CM | POA: Diagnosis not present

## 2014-01-28 DIAGNOSIS — M5137 Other intervertebral disc degeneration, lumbosacral region: Secondary | ICD-10-CM | POA: Diagnosis not present

## 2014-01-28 DIAGNOSIS — M5430 Sciatica, unspecified side: Secondary | ICD-10-CM | POA: Diagnosis not present

## 2014-01-28 DIAGNOSIS — M5106 Intervertebral disc disorders with myelopathy, lumbar region: Secondary | ICD-10-CM | POA: Diagnosis not present

## 2014-02-24 ENCOUNTER — Encounter: Payer: Self-pay | Admitting: Family

## 2014-02-24 ENCOUNTER — Ambulatory Visit (INDEPENDENT_AMBULATORY_CARE_PROVIDER_SITE_OTHER): Payer: Medicare Other | Admitting: Family

## 2014-02-24 VITALS — BP 128/78 | HR 80 | Ht 64.0 in | Wt 124.3 lb

## 2014-02-24 DIAGNOSIS — K2100 Gastro-esophageal reflux disease with esophagitis, without bleeding: Secondary | ICD-10-CM | POA: Diagnosis not present

## 2014-02-24 DIAGNOSIS — Z23 Encounter for immunization: Secondary | ICD-10-CM

## 2014-02-24 DIAGNOSIS — R197 Diarrhea, unspecified: Secondary | ICD-10-CM | POA: Diagnosis not present

## 2014-02-24 LAB — POCT URINALYSIS DIPSTICK
Bilirubin, UA: NEGATIVE
Glucose, UA: NEGATIVE
KETONES UA: NEGATIVE
Leukocytes, UA: NEGATIVE
Nitrite, UA: NEGATIVE
PROTEIN UA: NEGATIVE
RBC UA: NEGATIVE
Spec Grav, UA: <=1.005
UROBILINOGEN UA: 0.2
pH, UA: 6.5

## 2014-02-24 NOTE — Progress Notes (Signed)
Subjective:    Patient ID: Sara Medina, female    DOB: 07-02-1929, 80 y.o.   MRN: [id number]  HPI 80 year old white female nonsmoker with a history of GERD, hypothyroidism, hypertension is in today with complaints of watery stools x10 days. Denies any blood in her stools. Has occasional abdominal cramping that comes and goes. Denies any foul-smelling stools. No new medications or recent antibiotics. No recent travel out of the [place]. No exposure to anyone with diarrhea. Has taken Imodium that helps but does not stop the diarrhea. Reports feeling rectal pressure and had an explosive stools.   Patient also complains of sore throat. She has a history of GERD but has not taken Prilosec. Occasionally takes Pepcid AC. Denies any heartburn or indigestion. Does have a dry hacking cough.    Review of Systems  Constitutional: Negative.   HENT: Positive for sore throat.   Respiratory: Positive for cough and chest tightness. Negative for wheezing.   Cardiovascular: Negative.  Negative for chest pain.  Gastrointestinal: Positive for diarrhea. Negative for nausea, vomiting and abdominal pain.  Endocrine: Negative.   Genitourinary: Negative.   Musculoskeletal: Negative.   Skin: Negative.   Allergic/Immunologic: Negative.   Neurological: Negative.   Psychiatric/Behavioral: Negative.    Past Medical History  Diagnosis Date  . Allergy   . Asthma   . Diverticulosis   . History of diverticulitis of colon   . GERD (gastroesophageal reflux disease)   . Arthritis   . Hyperlipidemia   . Hypertension   . Thyroid disease     History   Social History  . Marital Status: Widowed    Spouse Name: N/A    Number of Children: N/A  . Years of Education: N/A   Occupational History  . retired    Social History Main Topics  . Smoking status: Never Smoker   . Smokeless tobacco: Never Used  . Alcohol Use: No  . Drug Use: No  . Sexual Activity: Not Currently   Other Topics Concern  . Not on  file   Social History Narrative  . No narrative on file    Past Surgical History  Procedure Laterality Date  . Trigger thumb    . Abdominal hysterectomy    . Tonsillectomy      Family History  Problem Relation Age of Onset  . Ovarian cancer    . Cancer Mother     ovarian  . Heart disease Father   . Stroke Father   . Cancer Sister     brain    Allergies  Allergen Reactions  . Peanut-Containing Drug Products   . Penicillins     Current Outpatient Prescriptions on File Prior to Visit  Medication Sig Dispense Refill  . aspirin 81 MG tablet Take 81 mg by mouth daily.        . calcium gluconate 500 MG tablet Take 500 mg by mouth daily.        . Cholecalciferol (VITAMIN D-3 PO) Take 1,000 Int'l Units by mouth daily.      Marland Kitchen estradiol (ESTRACE) 0.5 MG tablet Take 1 tablet (0.5 mg total) by mouth daily.  90 tablet  11  . folic acid (FOLVITE) 323 MCG tablet Take 400 mcg by mouth daily.        Marland Kitchen glucosamine-chondroitin 500-400 MG tablet Take 3 tablets by mouth daily.       Marland Kitchen levothyroxine (SYNTHROID, LEVOTHROID) 25 MCG tablet Take 1 tablet (25 mcg total) by mouth daily before breakfast.  90 tablet  3  . MAGNESIUM-ZINC PO Take by mouth.      . metoprolol tartrate (LOPRESSOR) 25 MG tablet Take 1 tablet (25 mg total) by mouth 2 (two) times daily.  180 tablet  11  . naproxen sodium (ALEVE) 220 MG tablet Take 220 mg by mouth 2 (two) times daily as needed.      Marland Kitchen omeprazole (PRILOSEC) 40 MG capsule TAKE ONE CAPSULE BY MOUTH DAILY  90 capsule  1  . PROAIR HFA 108 (90 BASE) MCG/ACT inhaler INHALE 2 PUFFS INTO THE LUNGS EVERY 6 (SIX) HOURS AS NEEDED FOR WHEEZING OR SHORTNESS OF BREATH.  8.5 g  2  . [place] PO Take by mouth 3 (three) times daily.      . vitamin B-12 (CYANOCOBALAMIN) 500 MCG tablet Take 1,000 mcg by mouth 2 (two) times daily.       Marland Kitchen gemfibrozil (LOPID) 600 MG tablet Take 1 tablet (600 mg total) by mouth 2 (two) times daily before a meal.  60 tablet  11   No current  facility-administered medications on file prior to visit.    BP 128/78  Pulse 80  Ht 5\' 4"  (1.626 m)  Wt 124 lb 4.8 oz (56.382 kg)  BMI 21.33 kg/m2chart     Objective:   Physical Exam  Constitutional: She is oriented to person, place, and time. She appears well-developed and well-nourished.  HENT:  Right Ear: External ear normal.  Left Ear: External ear normal.  Nose: Nose normal.  Mouth/Throat: Oropharynx is clear and moist.  Eyes: Conjunctivae are normal. Pupils are equal, round, and reactive to light.  Neck: Normal range of motion. Neck supple. No thyromegaly present.  Cardiovascular: Normal rate, regular rhythm and normal heart sounds.   Pulmonary/Chest: Effort normal and breath sounds normal.  Abdominal: Soft. Bowel sounds are normal.  Musculoskeletal: Normal range of motion.  Neurological: She is alert and oriented to person, place, and time.  Skin: Skin is warm and dry.  Psychiatric: She has a normal mood and affect.          Assessment & Plan:  Morrigan was seen today for diarrhea and asthma.  Diagnoses and associated orders for this visit:  Diarrhea - Stool culture - POC Urinalysis Dipstick - CBC with Differential - Basic Metabolic Panel - Hepatic Function Panel  Gastroesophageal reflux disease with esophagitis - CBC with Differential - Basic Metabolic Panel - Hepatic Function Panel  Need for prophylactic vaccination and inoculation against influenza - Flu vaccine HIGH DOSE PF (Fluzone Tri High dose)     cough and sore throat likely related to GERD. Advised her to resume Prilosec. Le me know and how she is doing in one week.

## 2014-02-24 NOTE — Patient Instructions (Signed)

## 2014-02-24 NOTE — Progress Notes (Signed)
Pre visit review using our clinic review tool, if applicable. No additional management support is needed unless otherwise documented below in the visit note. 

## 2014-02-25 ENCOUNTER — Other Ambulatory Visit: Payer: Self-pay | Admitting: Family

## 2014-02-25 ENCOUNTER — Telehealth: Payer: Self-pay | Admitting: *Deleted

## 2014-02-25 ENCOUNTER — Other Ambulatory Visit: Payer: Self-pay

## 2014-02-25 DIAGNOSIS — E876 Hypokalemia: Secondary | ICD-10-CM

## 2014-02-25 LAB — CBC WITH DIFFERENTIAL/PLATELET
BASOS ABS: 0 10*3/uL (ref 0.0–0.1)
Basophils Relative: 0.5 % (ref 0.0–3.0)
EOS ABS: 0.2 10*3/uL (ref 0.0–0.7)
EOS PCT: 3.1 % (ref 0.0–5.0)
HCT: 39.6 % (ref 36.0–46.0)
Hemoglobin: 13.4 g/dL (ref 12.0–15.0)
LYMPHS ABS: 1.6 10*3/uL (ref 0.7–4.0)
Lymphocytes Relative: 22.9 % (ref 12.0–46.0)
MCHC: 33.8 g/dL (ref 30.0–36.0)
MCV: 94.9 fl (ref 78.0–100.0)
MONO ABS: 0.9 10*3/uL (ref 0.1–1.0)
Monocytes Relative: 12.8 % — ABNORMAL HIGH (ref 3.0–12.0)
NEUTROS PCT: 60.7 % (ref 43.0–77.0)
Neutro Abs: 4.2 10*3/uL (ref 1.4–7.7)
Platelets: 366 10*3/uL (ref 150.0–400.0)
RBC: 4.18 Mil/uL (ref 3.87–5.11)
RDW: 13.1 % (ref 11.5–15.5)
WBC: 6.9 10*3/uL (ref 4.0–10.5)

## 2014-02-25 LAB — BASIC METABOLIC PANEL
BUN: 20 mg/dL (ref 6–23)
CALCIUM: 9.4 mg/dL (ref 8.4–10.5)
CO2: 21 mEq/L (ref 19–32)
Chloride: 104 mEq/L (ref 96–112)
Creatinine, Ser: 0.9 mg/dL (ref 0.4–1.2)
GFR: 62.89 mL/min (ref >60.00)
GLUCOSE: 103 mg/dL — AB (ref 70–99)
Potassium: 2.7 mEq/L — CL (ref 3.5–5.1)
SODIUM: 132 meq/L — AB (ref 135–145)

## 2014-02-25 LAB — HEPATIC FUNCTION PANEL
ALBUMIN: 3.9 g/dL (ref 3.5–5.2)
ALT: 18 U/L (ref 0–35)
AST: 29 U/L (ref 0–37)
Alkaline Phosphatase: 44 U/L (ref 39–117)
BILIRUBIN TOTAL: 0.6 mg/dL (ref 0.2–1.2)
Bilirubin, Direct: 0 mg/dL (ref 0.0–0.3)
Total Protein: 7.1 g/dL (ref 6.0–8.3)

## 2014-02-25 MED ORDER — POTASSIUM CHLORIDE ER 20 MEQ PO TBCR: 1.0000 | EXTENDED_RELEASE_TABLET | Freq: Every day | ORAL | Status: DC

## 2014-02-25 NOTE — Telephone Encounter (Signed)
Pt aware and states that she is feeling so much even in the last few hours that she doesn't expect to have to go to the ED but she will if she needs to

## 2014-02-25 NOTE — Telephone Encounter (Signed)
Hope from [facility] is calling with a lab result of potassium 2.7 please advise.

## 2014-02-25 NOTE — Telephone Encounter (Signed)
Call asap.to get potassium replaced. To the ED if she develops chest pain or palpitations.

## 2014-02-27 ENCOUNTER — Encounter: Payer: Self-pay | Admitting: Family

## 2014-02-28 ENCOUNTER — Ambulatory Visit (INDEPENDENT_AMBULATORY_CARE_PROVIDER_SITE_OTHER): Payer: Medicare Other | Admitting: Family

## 2014-02-28 ENCOUNTER — Telehealth: Payer: Self-pay

## 2014-02-28 ENCOUNTER — Other Ambulatory Visit: Payer: Self-pay | Admitting: Family

## 2014-02-28 VITALS — BP 124/80 | HR 93 | Temp 97.7°F | Wt 121.5 lb

## 2014-02-28 DIAGNOSIS — R197 Diarrhea, unspecified: Secondary | ICD-10-CM | POA: Diagnosis not present

## 2014-02-28 DIAGNOSIS — E039 Hypothyroidism, unspecified: Secondary | ICD-10-CM

## 2014-02-28 DIAGNOSIS — R634 Abnormal weight loss: Secondary | ICD-10-CM

## 2014-02-28 DIAGNOSIS — I10 Essential (primary) hypertension: Secondary | ICD-10-CM | POA: Diagnosis not present

## 2014-02-28 DIAGNOSIS — R630 Anorexia: Secondary | ICD-10-CM

## 2014-02-28 DIAGNOSIS — E876 Hypokalemia: Secondary | ICD-10-CM

## 2014-02-28 LAB — CBC WITH DIFFERENTIAL/PLATELET
Basophils Absolute: 0 10*3/uL (ref 0.0–0.1)
Basophils Relative: 0.4 % (ref 0.0–3.0)
EOS PCT: 2.7 % (ref 0.0–5.0)
Eosinophils Absolute: 0.2 10*3/uL (ref 0.0–0.7)
HCT: 39.4 % (ref 36.0–46.0)
HEMOGLOBIN: 13.3 g/dL (ref 12.0–15.0)
LYMPHS ABS: 1.6 10*3/uL (ref 0.7–4.0)
Lymphocytes Relative: 17.5 % (ref 12.0–46.0)
MCHC: 33.8 g/dL (ref 30.0–36.0)
MCV: 93.8 fl (ref 78.0–100.0)
MONO ABS: 1 10*3/uL (ref 0.1–1.0)
MONOS PCT: 11 % (ref 3.0–12.0)
Neutro Abs: 6.2 10*3/uL (ref 1.4–7.7)
Neutrophils Relative %: 68.4 % (ref 43.0–77.0)
Platelets: 391 10*3/uL (ref 150.0–400.0)
RBC: 4.21 Mil/uL (ref 3.87–5.11)
RDW: 13 % (ref 11.5–15.5)
WBC: 9.1 10*3/uL (ref 4.0–10.5)

## 2014-02-28 LAB — BASIC METABOLIC PANEL
BUN: 23 mg/dL (ref 6–23)
CALCIUM: 8.8 mg/dL (ref 8.4–10.5)
CO2: 17 meq/L — AB (ref 19–32)
CREATININE: 0.9 mg/dL (ref 0.4–1.2)
Chloride: 108 mEq/L (ref 96–112)
GFR: 61.33 mL/min (ref >60.00)
GLUCOSE: 118 mg/dL — AB (ref 70–99)
Potassium: 2.7 mEq/L — CL (ref 3.5–5.1)
Sodium: 133 mEq/L — ABNORMAL LOW (ref 135–145)

## 2014-02-28 LAB — TSH: TSH: 0.56 u[IU]/mL (ref 0.35–4.50)

## 2014-02-28 MED ORDER — SODIUM CHLORIDE 0.9 % IV BOLUS (SEPSIS): 1000.0000 mL | Freq: Once | INTRAVENOUS | Status: DC

## 2014-02-28 MED ORDER — CIPROFLOXACIN HCL 500 MG PO TABS: 500.0000 mg | ORAL_TABLET | Freq: Two times a day (BID) | ORAL | Status: DC

## 2014-02-28 NOTE — Telephone Encounter (Signed)
Left message on pt VM 

## 2014-02-28 NOTE — Telephone Encounter (Signed)
Potassium 20 meq take 3 tablets daily (until f/u Monday) and office follow up by Monday for repeat BMP.

## 2014-02-28 NOTE — Telephone Encounter (Signed)
Received critical lab Potassium 2.7.

## 2014-02-28 NOTE — Patient Instructions (Signed)
Dehydration Dehydration is when you lose more fluids from the body than you take in. Vital organs such as the kidneys, brain, and heart cannot function without a proper amount of fluids and salt. Any loss of fluids from the body can cause dehydration.  Older adults are at a higher risk of dehydration than younger adults. As we age, our bodies are less able to conserve water and do not respond to temperature changes as well. Also, older adults do not become thirsty as easily or quickly. Because of this, older adults often do not realize they need to increase fluids to avoid dehydration.  CAUSES   Vomiting.  Diarrhea.  Excessive sweating.  Excessive urination.  Fever.  Certain medicines, such as blood pressure medicines called diuretics.  Poorly controlled blood sugars. SIGNS AND SYMPTOMS  Mild dehydration:  Thirst.  Dry lips.  Slightly dry mouth. Moderate dehydration:  Very dry mouth.  Sunken eyes.  Skin does not bounce back quickly when lightly pinched and released.  Dark urine and decreased urine production.  Decreased tear production.  Headache. Severe dehydration:  Very dry mouth.  Extreme thirst.  Rapid, weak pulse (more than 100 beats per minute at rest).  Cold hands and feet.  Not able to sweat in spite of heat.  Rapid breathing.  Blue lips.  Confusion and lethargy.  Difficulty being awakened.  Minimal urine production.  No tears. DIAGNOSIS  Your health care provider will diagnose dehydration based on your symptoms and your exam. Blood and urine tests will help confirm the diagnosis. The diagnostic evaluation should also identify the cause of dehydration. TREATMENT  Treatment of mild or moderate dehydration can often be done at home by increasing the amount of fluids that you drink. It is best to drink small amounts of fluid more often. Drinking too much at one time can make vomiting worse. Severe dehydration needs to be treated at the hospital.  You may be given IV fluids that contain water and electrolytes. HOME CARE INSTRUCTIONS   Ask your health care provider about specific rehydration instructions.  Drink enough fluids to keep your urine clear or pale yellow.  Drink small amounts frequently if you have nausea and vomiting.  Eat as you normally do.  Avoid:  Foods or drinks high in sugar.  Carbonated drinks.  Juice.  Extremely hot or cold fluids.  Drinks with caffeine.  Fatty, greasy foods.  Alcohol.  Tobacco.  Overeating.  Gelatin desserts.  Wash your hands well to avoid spreading bacteria and viruses.  Only take over-the-counter or prescription medicines for pain, discomfort, or fever as directed by your health care provider.  Ask your health care provider if you should continue all prescribed and over-the-counter medicines.  Keep all follow-up appointments with your health care provider. SEEK MEDICAL CARE IF:  You have abdominal pain, and it increases or stays in one area (localizes).  You have a rash, stiff neck, or severe headache.  You are irritable, sleepy, or difficult to awaken.  You are weak, dizzy, or extremely thirsty.  You have a fever. SEEK IMMEDIATE MEDICAL CARE IF:   You are unable to keep fluids down, or you get worse despite treatment.  You have frequent episodes of vomiting or diarrhea.  You have blood or green matter (bile) in your vomit.  You have blood in your stool, or your stool looks black and tarry.  You have not urinated in 6-8 hours, or you have only urinated a small amount of very dark urine.    You faint. MAKE SURE YOU:   Understand these instructions.  Will watch your condition.  Will get help right away if you are not doing well or get worse. Document Released: 08/06/2003 Document Revised: 05/21/2013 Document Reviewed: 01/21/2013 ExitCare Patient Information 2015 [employer]. This information is not intended to replace advice given to you by your  health care provider. Make sure you discuss any questions you have with your health care provider.  

## 2014-02-28 NOTE — Progress Notes (Signed)
Pre visit review using our clinic review tool, if applicable. No additional management support is needed unless otherwise documented below in the visit note. 

## 2014-02-28 NOTE — Telephone Encounter (Signed)
Yes lets do a recheck. Please schedule.

## 2014-03-01 LAB — STOOL CULTURE

## 2014-03-02 ENCOUNTER — Encounter: Payer: Self-pay | Admitting: Family

## 2014-03-03 ENCOUNTER — Encounter: Payer: Self-pay | Admitting: Family

## 2014-03-03 ENCOUNTER — Telehealth: Payer: Self-pay | Admitting: Family

## 2014-03-03 NOTE — Telephone Encounter (Signed)
emmi emailed °

## 2014-03-03 NOTE — Progress Notes (Signed)
Subjective:    Patient ID: Sara Medina, female    DOB: 1929-10-20, 80 y.o.   MRN: [id number]  GI Problem The primary symptoms include fatigue and diarrhea. Primary symptoms do not include fever, nausea or vomiting.  The illness does not include chills.   80 year old white female, nonsmoker is in today with persistent complaints of diarrhea. Reports 10-15 loose stools daily. Date of onset was 02/13/14. She has been taking Imodium that helps some and increasing her fluid intake. Continues to lose weight and had decreased appetite. Denies any travel outside of the [place]. No dietary changes. Denies any increase in stress. No recent antibiotics. Symptoms are worse with eating. Has been consuming a bland diet.   Review of Systems  Constitutional: Positive for fatigue and unexpected weight change. Negative for fever and chills.  HENT: Negative.   Respiratory: Negative.   Cardiovascular: Negative.   Gastrointestinal: Positive for diarrhea. Negative for nausea, vomiting, blood in stool and rectal pain.  Endocrine: Negative.   Genitourinary: Negative.   Musculoskeletal: Negative.   Skin: Negative.   Allergic/Immunologic: Negative.   Neurological: Negative.   Hematological: Negative.   Psychiatric/Behavioral: Negative.    Past Medical History  Diagnosis Date  . Allergy   . Asthma   . Diverticulosis   . History of diverticulitis of colon   . GERD (gastroesophageal reflux disease)   . Arthritis   . Hyperlipidemia   . Hypertension   . Thyroid disease     History   Social History  . Marital Status: Widowed    Spouse Name: N/A    Number of Children: N/A  . Years of Education: N/A   Occupational History  . retired    Social History Main Topics  . Smoking status: Never Smoker   . Smokeless tobacco: Never Used  . Alcohol Use: No  . Drug Use: No  . Sexual Activity: Not Currently   Other Topics Concern  . Not on file   Social History Narrative  . No narrative on file     Past Surgical History  Procedure Laterality Date  . Trigger thumb    . Abdominal hysterectomy    . Tonsillectomy      Family History  Problem Relation Age of Onset  . Ovarian cancer    . Cancer Mother     ovarian  . Heart disease Father   . Stroke Father   . Cancer Sister     brain    Allergies  Allergen Reactions  . Peanut-Containing Drug Products   . Penicillins     Current Outpatient Prescriptions on File Prior to Visit  Medication Sig Dispense Refill  . aspirin 81 MG tablet Take 81 mg by mouth daily.        . calcium gluconate 500 MG tablet Take 500 mg by mouth daily.        . Cholecalciferol (VITAMIN D-3 PO) Take 1,000 Int'l Units by mouth daily.      Marland Kitchen estradiol (ESTRACE) 0.5 MG tablet Take 1 tablet (0.5 mg total) by mouth daily.  90 tablet  11  . folic acid (FOLVITE) 528 MCG tablet Take 400 mcg by mouth daily.        Marland Kitchen glucosamine-chondroitin 500-400 MG tablet Take 3 tablets by mouth daily.       Marland Kitchen levothyroxine (SYNTHROID, LEVOTHROID) 25 MCG tablet Take 1 tablet (25 mcg total) by mouth daily before breakfast.  90 tablet  3  . MAGNESIUM-ZINC PO Take by mouth.      Marland Kitchen  metoprolol tartrate (LOPRESSOR) 25 MG tablet Take 1 tablet (25 mg total) by mouth 2 (two) times daily.  180 tablet  11  . naproxen sodium (ALEVE) 220 MG tablet Take 220 mg by mouth 2 (two) times daily as needed.      Marland Kitchen omeprazole (PRILOSEC) 40 MG capsule TAKE ONE CAPSULE BY MOUTH DAILY  90 capsule  1  . Potassium Chloride ER 20 MEQ TBCR Take 1 tablet by mouth daily.  30 tablet  3  . PROAIR HFA 108 (90 BASE) MCG/ACT inhaler INHALE 2 PUFFS INTO THE LUNGS EVERY 6 (SIX) HOURS AS NEEDED FOR WHEEZING OR SHORTNESS OF BREATH.  8.5 g  2  . ST JOHNS WORT PO Take by mouth 3 (three) times daily.      . vitamin B-12 (CYANOCOBALAMIN) 500 MCG tablet Take 1,000 mcg by mouth 2 (two) times daily.        No current facility-administered medications on file prior to visit.    BP 124/80  Pulse 93  Temp(Src) 97.7 F  (36.5 C) (Oral)  Wt 121 lb 8 oz (55.112 kg)  SpO2 96%chart    Objective:   Physical Exam  Constitutional: She is oriented to person, place, and time. She appears well-developed and well-nourished.  HENT:  Right Ear: External ear normal.  Left Ear: External ear normal.  Nose: Nose normal.  Mouth/Throat: Oropharynx is clear and moist.  Neck: Normal range of motion.  Cardiovascular: Normal rate, regular rhythm and normal heart sounds.   Pulmonary/Chest: Effort normal and breath sounds normal.  Abdominal: Soft. Bowel sounds are normal.  Musculoskeletal: Normal range of motion.  Neurological: She is alert and oriented to person, place, and time.  Skin: Skin is warm and dry.  Skin turgor decreased   Psychiatric: She has a normal mood and affect.    I be started on 1 L of normal saline was given today. Patient tolerated the procedure well. Feels better. Labs obtained prior to IV administration.      Assessment & Plan:  Sara Medina was seen today for gi problem.  Diagnoses and associated orders for this visit:  Diarrhea - CBC with Differential - Basic Metabolic Panel - sodium chloride 0.9 % bolus 1,000 mL; Inject 1,000 mLs into the vein once.  Appetite loss - sodium chloride 0.9 % bolus 1,000 mL; Inject 1,000 mLs into the vein once.  Essential hypertension, benign  Hypothyroidism, unspecified hypothyroidism type - TSH  Hypokalemia  Loss of weight  Other Orders - ciprofloxacin (CIPRO) 500 MG tablet; Take 1 tablet (500 mg total) by mouth 2 (two) times daily.    increased potassium to 60 mEq daily. Recheck potassium on Tuesday. Start Cipro. Imodium as needed for diarrhea. Consider referral to gastroenterologist if symptoms do not improve. Patient in the office approximately one hour 15 minutes today.

## 2014-03-04 DIAGNOSIS — M545 Low back pain: Secondary | ICD-10-CM | POA: Diagnosis not present

## 2014-03-04 DIAGNOSIS — M47816 Spondylosis without myelopathy or radiculopathy, lumbar region: Secondary | ICD-10-CM | POA: Diagnosis not present

## 2014-03-04 DIAGNOSIS — M5136 Other intervertebral disc degeneration, lumbar region: Secondary | ICD-10-CM | POA: Diagnosis not present

## 2014-03-04 DIAGNOSIS — M9903 Segmental and somatic dysfunction of lumbar region: Secondary | ICD-10-CM | POA: Diagnosis not present

## 2014-03-05 ENCOUNTER — Encounter: Payer: Self-pay | Admitting: Family

## 2014-03-07 ENCOUNTER — Other Ambulatory Visit: Payer: Self-pay | Admitting: Family

## 2014-03-07 ENCOUNTER — Encounter: Payer: Self-pay | Admitting: Family

## 2014-03-07 ENCOUNTER — Other Ambulatory Visit: Payer: Self-pay

## 2014-03-07 MED ORDER — POTASSIUM CHLORIDE ER 20 MEQ PO TBCR: 3.0000 | EXTENDED_RELEASE_TABLET | Freq: Every day | ORAL | Status: DC

## 2014-03-11 ENCOUNTER — Other Ambulatory Visit (INDEPENDENT_AMBULATORY_CARE_PROVIDER_SITE_OTHER): Payer: Medicare Other

## 2014-03-11 DIAGNOSIS — E876 Hypokalemia: Secondary | ICD-10-CM

## 2014-03-11 LAB — BASIC METABOLIC PANEL
BUN: 11 mg/dL (ref 6–23)
CALCIUM: 9.1 mg/dL (ref 8.4–10.5)
CO2: 22 meq/L (ref 19–32)
CREATININE: 0.8 mg/dL (ref 0.4–1.2)
Chloride: 104 mEq/L (ref 96–112)
GFR: 70.91 mL/min (ref >60.00)
GLUCOSE: 90 mg/dL (ref 70–99)
Potassium: 4.5 mEq/L (ref 3.5–5.1)
SODIUM: 136 meq/L (ref 135–145)

## 2014-03-12 ENCOUNTER — Encounter: Payer: Self-pay | Admitting: Family

## 2014-03-18 DIAGNOSIS — M5136 Other intervertebral disc degeneration, lumbar region: Secondary | ICD-10-CM | POA: Diagnosis not present

## 2014-03-18 DIAGNOSIS — M47816 Spondylosis without myelopathy or radiculopathy, lumbar region: Secondary | ICD-10-CM | POA: Diagnosis not present

## 2014-03-18 DIAGNOSIS — M545 Low back pain: Secondary | ICD-10-CM | POA: Diagnosis not present

## 2014-03-18 DIAGNOSIS — M9903 Segmental and somatic dysfunction of lumbar region: Secondary | ICD-10-CM | POA: Diagnosis not present

## 2014-03-31 ENCOUNTER — Other Ambulatory Visit: Payer: Self-pay | Admitting: Family

## 2014-04-01 ENCOUNTER — Other Ambulatory Visit (INDEPENDENT_AMBULATORY_CARE_PROVIDER_SITE_OTHER): Payer: Medicare Other

## 2014-04-01 DIAGNOSIS — M9903 Segmental and somatic dysfunction of lumbar region: Secondary | ICD-10-CM | POA: Diagnosis not present

## 2014-04-01 DIAGNOSIS — M5136 Other intervertebral disc degeneration, lumbar region: Secondary | ICD-10-CM | POA: Diagnosis not present

## 2014-04-01 DIAGNOSIS — E039 Hypothyroidism, unspecified: Secondary | ICD-10-CM | POA: Diagnosis not present

## 2014-04-01 DIAGNOSIS — M47816 Spondylosis without myelopathy or radiculopathy, lumbar region: Secondary | ICD-10-CM | POA: Diagnosis not present

## 2014-04-01 DIAGNOSIS — Z0000 Encounter for general adult medical examination without abnormal findings: Secondary | ICD-10-CM

## 2014-04-01 DIAGNOSIS — I10 Essential (primary) hypertension: Secondary | ICD-10-CM | POA: Diagnosis not present

## 2014-04-01 DIAGNOSIS — M545 Low back pain: Secondary | ICD-10-CM | POA: Diagnosis not present

## 2014-04-01 LAB — POCT URINALYSIS DIPSTICK
Bilirubin, UA: NEGATIVE
Blood, UA: NEGATIVE
Glucose, UA: NEGATIVE
Ketones, UA: NEGATIVE
Leukocytes, UA: NEGATIVE
Nitrite, UA: NEGATIVE
Spec Grav, UA: <=1.005
Urobilinogen, UA: 0.2
pH, UA: 5.5

## 2014-04-01 LAB — BASIC METABOLIC PANEL
BUN: 13 mg/dL (ref 6–23)
CO2: 21 mEq/L (ref 19–32)
Calcium: 9.2 mg/dL (ref 8.4–10.5)
Chloride: 109 mEq/L (ref 96–112)
Creatinine, Ser: 0.9 mg/dL (ref 0.4–1.2)
GFR: 63.68 mL/min (ref >60.00)
Glucose, Bld: 103 mg/dL — ABNORMAL HIGH (ref 70–99)
Potassium: 4.4 mEq/L (ref 3.5–5.1)
Sodium: 139 mEq/L (ref 135–145)

## 2014-04-01 LAB — CBC WITH DIFFERENTIAL/PLATELET
Basophils Absolute: 0.1 10*3/uL (ref 0.0–0.1)
Basophils Relative: 0.5 % (ref 0.0–3.0)
EOS ABS: 0.4 10*3/uL (ref 0.0–0.7)
Eosinophils Relative: 4 % (ref 0.0–5.0)
HEMATOCRIT: 40.9 % (ref 36.0–46.0)
HEMOGLOBIN: 13.5 g/dL (ref 12.0–15.0)
LYMPHS ABS: 2.3 10*3/uL (ref 0.7–4.0)
Lymphocytes Relative: 21.3 % (ref 12.0–46.0)
MCHC: 33 g/dL (ref 30.0–36.0)
MCV: 95.7 fl (ref 78.0–100.0)
MONOS PCT: 6.8 % (ref 3.0–12.0)
Monocytes Absolute: 0.7 10*3/uL (ref 0.1–1.0)
NEUTROS ABS: 7.2 10*3/uL (ref 1.4–7.7)
Neutrophils Relative %: 67.4 % (ref 43.0–77.0)
Platelets: 290 10*3/uL (ref 150.0–400.0)
RBC: 4.27 Mil/uL (ref 3.87–5.11)
RDW: 13.5 % (ref 11.5–15.5)
WBC: 10.8 10*3/uL — ABNORMAL HIGH (ref 4.0–10.5)

## 2014-04-01 LAB — TSH: TSH: 4.08 u[IU]/mL (ref 0.35–4.50)

## 2014-04-01 LAB — LIPID PANEL
Cholesterol: 210 mg/dL — ABNORMAL HIGH (ref 0–200)
HDL: 53.5 mg/dL (ref >39.00)
LDL Cholesterol: 131 mg/dL — ABNORMAL HIGH (ref 0–99)
NonHDL: 156.5
Total CHOL/HDL Ratio: 4
Triglycerides: 127 mg/dL (ref 0.0–149.0)
VLDL: 25.4 mg/dL (ref 0.0–40.0)

## 2014-04-01 LAB — HEPATIC FUNCTION PANEL
ALBUMIN: 3.3 g/dL — AB (ref 3.5–5.2)
ALK PHOS: 43 U/L (ref 39–117)
ALT: 19 U/L (ref 0–35)
AST: 24 U/L (ref 0–37)
Bilirubin, Direct: 0 mg/dL (ref 0.0–0.3)
Total Bilirubin: 0.5 mg/dL (ref 0.2–1.2)
Total Protein: 6.7 g/dL (ref 6.0–8.3)

## 2014-04-08 ENCOUNTER — Encounter: Payer: Self-pay | Admitting: Family

## 2014-04-08 ENCOUNTER — Ambulatory Visit (INDEPENDENT_AMBULATORY_CARE_PROVIDER_SITE_OTHER): Payer: Medicare Other | Admitting: Family

## 2014-04-08 VITALS — BP 130/80 | HR 74 | Ht 64.0 in | Wt 127.4 lb

## 2014-04-08 DIAGNOSIS — Z0000 Encounter for general adult medical examination without abnormal findings: Secondary | ICD-10-CM

## 2014-04-08 DIAGNOSIS — E876 Hypokalemia: Secondary | ICD-10-CM

## 2014-04-08 DIAGNOSIS — K219 Gastro-esophageal reflux disease without esophagitis: Secondary | ICD-10-CM

## 2014-04-08 DIAGNOSIS — E039 Hypothyroidism, unspecified: Secondary | ICD-10-CM | POA: Diagnosis not present

## 2014-04-08 DIAGNOSIS — Z23 Encounter for immunization: Secondary | ICD-10-CM | POA: Diagnosis not present

## 2014-04-08 MED ORDER — OMEPRAZOLE 20 MG PO CPDR: DELAYED_RELEASE_CAPSULE | ORAL | Status: DC

## 2014-04-08 NOTE — Progress Notes (Signed)
Pre visit review using our clinic review tool, if applicable. No additional management support is needed unless otherwise documented below in the visit note. 

## 2014-04-08 NOTE — Progress Notes (Signed)
Subjective:    Patient ID: Sara Medina, female    DOB: May 23, 1930, 80 y.o.   MRN: [id number]  HPI 80 year old white female is in today for wellness exam.  I reviewed all health maintenance protocols including mammography, colonoscopy, bone density Needed referrals were placed. Age and diagnosis  appropriate screening labs were ordered. Her immunization history was reviewed and appropriate vaccinations were ordered. Her current medications and allergies were reviewed and needed refills of her chronic medications were ordered. The plan for yearly health maintenance was discussed all orders and referrals were made as appropriate.   Review of Systems  Constitutional: Negative.   HENT: Negative.   Eyes: Negative.   Respiratory: Negative.   Cardiovascular: Negative.   Gastrointestinal: Negative.   Endocrine: Negative.   Genitourinary: Negative.   Musculoskeletal: Negative.   Skin: Negative.   Allergic/Immunologic: Negative.   Neurological: Negative.   Hematological: Negative.   Psychiatric/Behavioral: Negative.    Past Medical History  Diagnosis Date  . Allergy   . Asthma   . Diverticulosis   . History of diverticulitis of colon   . GERD (gastroesophageal reflux disease)   . Arthritis   . Hyperlipidemia   . Hypertension   . Thyroid disease     History   Social History  . Marital Status: Widowed    Spouse Name: N/A    Number of Children: N/A  . Years of Education: N/A   Occupational History  . retired    Social History Main Topics  . Smoking status: Never Smoker   . Smokeless tobacco: Never Used  . Alcohol Use: No  . Drug Use: No  . Sexual Activity: Not Currently   Other Topics Concern  . Not on file   Social History Narrative    Past Surgical History  Procedure Laterality Date  . Trigger thumb    . Abdominal hysterectomy    . Tonsillectomy      Family History  Problem Relation Age of Onset  . Ovarian cancer    . Cancer Mother     ovarian  . Heart  disease Father   . Stroke Father   . Cancer Sister     brain    Allergies  Allergen Reactions  . Peanut-Containing Drug Products   . Penicillins     Current Outpatient Prescriptions on File Prior to Visit  Medication Sig Dispense Refill  . aspirin 81 MG tablet Take 81 mg by mouth daily.      . Cholecalciferol (VITAMIN D-3 PO) Take 1,000 Int'l Units by mouth daily.    Marland Kitchen estradiol (ESTRACE) 0.5 MG tablet Take 1 tablet (0.5 mg total) by mouth daily. 90 tablet 11  . folic acid (FOLVITE) 448 MCG tablet Take 400 mcg by mouth daily.      Marland Kitchen glucosamine-chondroitin 500-400 MG tablet Take 3 tablets by mouth daily.     Marland Kitchen levothyroxine (SYNTHROID, LEVOTHROID) 25 MCG tablet Take 1 tablet (25 mcg total) by mouth daily before breakfast. 90 tablet 3  . MAGNESIUM-ZINC PO Take by mouth.    . metoprolol tartrate (LOPRESSOR) 25 MG tablet Take 1 tablet (25 mg total) by mouth 2 (two) times daily. 180 tablet 11  . naproxen sodium (ALEVE) 220 MG tablet Take 220 mg by mouth 2 (two) times daily as needed.    . Potassium Chloride ER 20 MEQ TBCR TAKE 3 TABLETS BY MOUTH DAILY. 90 tablet 3  . PROAIR HFA 108 (90 BASE) MCG/ACT inhaler INHALE 2 PUFFS INTO THE LUNGS  EVERY 6 (SIX) HOURS AS NEEDED FOR WHEEZING OR SHORTNESS OF BREATH. 8.5 g 2  . ST JOHNS WORT PO Take by mouth 3 (three) times daily.    . vitamin B-12 (CYANOCOBALAMIN) 500 MCG tablet Take 1,000 mcg by mouth 2 (two) times daily.      No current facility-administered medications on file prior to visit.    BP 130/80 mmHg  Pulse 74  Ht 5\' 4"  (1.626 m)  Wt 127 lb 6.4 oz (57.788 kg)  BMI 21.86 kg/m2chart    Objective:   Physical Exam  Constitutional: She is oriented to person, place, and time. She appears well-developed and well-nourished.  HENT:  Right Ear: External ear normal.  Left Ear: External ear normal.  Nose: Nose normal.  Mouth/Throat: Oropharynx is clear and moist.  Eyes: Conjunctivae and EOM are normal. Pupils are equal, round, and  reactive to light.  Neck: Normal range of motion. Neck supple. No thyromegaly present.  Cardiovascular: Normal rate, regular rhythm and normal heart sounds.   Pulmonary/Chest: Effort normal and breath sounds normal.  Abdominal: Soft. Bowel sounds are normal.  Musculoskeletal: Normal range of motion.  Neurological: She is alert and oriented to person, place, and time. She has normal reflexes.  Skin: Skin is warm and dry.  Psychiatric: She has a normal mood and affect.          Assessment & Plan:  Sara Medina (80 was seen today for annual exam.  Diagnoses and associated orders for this visit:  Medicare annual wellness visit, subsequent  Hypokalemia  Gastroesophageal reflux disease without esophagitis  Hypothyroidism, unspecified hypothyroidism type  Other Orders - omeprazole (PRILOSEC) 20 MG capsule; TAKE ONE CAPSULE BY MOUTH DAILY   call the office with any questions or concerns. Recheck in 6 months and sooner as needed. Declined Prevnar because the last time she had it, she passed out.

## 2014-04-08 NOTE — Patient Instructions (Signed)
Cardiac Diet This diet can help prevent heart disease and stroke. Many factors influence your heart health, including eating and exercise habits. Coronary risk rises a lot with abnormal blood fat (lipid) levels. Cardiac meal planning includes limiting unhealthy fats, increasing healthy fats, and making other small dietary changes. General guidelines are as follows:  Adjust calorie intake to reach and maintain desirable body weight.  Limit total fat intake to less than 30% of total calories. Saturated fat should be less than 7% of calories.  Saturated fats are found in animal products and in some vegetable products. Saturated vegetable fats are found in coconut oil, cocoa butter, palm oil, and palm kernel oil. Read labels carefully to avoid these products as much as possible. Use butter in moderation. Choose tub margarines and oils that have 2 grams of fat or less. Good cooking oils are canola and olive oils.  Practice low-fat cooking techniques. Do not fry food. Instead, broil, bake, boil, steam, grill, roast on a rack, stir-fry, or microwave it. Other fat reducing suggestions include:  Remove the skin from poultry.  Remove all visible fat from meats.  Skim the fat off stews, soups, and gravies before serving them.  Steam vegetables in water or broth instead of sauting them in fat.  Avoid foods with trans fat (or hydrogenated oils), such as commercially fried foods and commercially baked goods. Commercial shortening and deep-frying fats will contain trans fat.  Increase intake of fruits, vegetables, whole grains, and legumes to replace foods high in fat.  Increase consumption of nuts, legumes, and seeds to at least 4 servings weekly. One serving of a legume equals  cup, and 1 serving of nuts or seeds equals  cup.  Choose whole grains more often. Have 3 servings per day (a serving is 1 ounce [oz]).  Eat 4 to 5 servings of vegetables per day. A serving of vegetables is 1 cup of raw leafy  vegetables;  cup of raw or cooked cut-up vegetables;  cup of vegetable juice.  Eat 4 to 5 servings of fruit per day. A serving of fruit is 1 medium whole fruit;  cup of dried fruit;  cup of fresh, frozen, or canned fruit;  cup of 100% fruit juice.  Increase your intake of dietary fiber to 20 to 30 grams per day. Insoluble fiber may help lower your risk of heart disease and may help curb your appetite.  Soluble fiber binds cholesterol to be removed from the blood. Foods high in soluble fiber are dried beans, citrus fruits, oats, apples, bananas, broccoli, Brussels sprouts, and eggplant.  Try to include foods fortified with plant sterols or stanols, such as yogurt, breads, juices, or margarines. Choose several fortified foods to achieve a daily intake of 2 to 3 grams of plant sterols or stanols.  Foods with omega-3 fats can help reduce your risk of heart disease. Aim to have a 3.5 oz portion of fatty fish twice per week, such as salmon, mackerel, albacore tuna, sardines, lake trout, or herring. If you wish to take a fish oil supplement, choose one that contains 1 gram of both DHA and EPA.  Limit processed meats to 2 servings (3 oz portion) weekly.  Limit the sodium in your diet to 1500 milligrams (mg) per day. If you have high blood pressure, talk to a registered dietitian about a DASH (Dietary Approaches to Stop Hypertension) eating plan.  Limit sweets and beverages with added sugar, such as soda, to no more than 5 servings per week. One   serving is:   1 tablespoon sugar.  1 tablespoon jelly or jam.   cup sorbet.  1 cup lemonade.   cup regular soda. CHOOSING FOODS Starches  Allowed: Breads: All kinds (wheat, rye, raisin, white, oatmeal, Italian, French, and English muffin bread). Low-fat rolls: English muffins, frankfurter and hamburger buns, bagels, pita bread, tortillas (not fried). Pancakes, waffles, biscuits, and muffins made with recommended oil.  Avoid: Products made with  saturated or trans fats, oils, or whole milk products. Butter rolls, cheese breads, croissants. Commercial doughnuts, muffins, sweet rolls, biscuits, waffles, pancakes, store-bought mixes. Crackers  Allowed: Low-fat crackers and snacks: Animal, graham, rye, saltine (with recommended oil, no lard), oyster, and matzo crackers. Bread sticks, melba toast, rusks, flatbread, pretzels, and light popcorn.  Avoid: High-fat crackers: cheese crackers, butter crackers, and those made with coconut, palm oil, or trans fat (hydrogenated oils). Buttered popcorn. Cereals  Allowed: Hot or cold whole-grain cereals.  Avoid: Cereals containing coconut, hydrogenated vegetable fat, or animal fat. Potatoes / Pasta / Rice  Allowed: All kinds of potatoes, rice, and pasta (such as macaroni, spaghetti, and noodles).  Avoid: Pasta or rice prepared with cream sauce or high-fat cheese. Chow mein noodles, French fries. Vegetables  Allowed: All vegetables and vegetable juices.  Avoid: Fried vegetables. Vegetables in cream, butter, or high-fat cheese sauces. Limit coconut. Fruit in cream or custard. Protein  Allowed: Limit your intake of meat, seafood, and poultry to no more than 6 oz (cooked weight) per day. All lean, well-trimmed beef, veal, pork, and lamb. All chicken and turkey without skin. All fish and shellfish. Wild game: wild duck, rabbit, pheasant, and venison. Egg whites or low-cholesterol egg substitutes may be used as desired. Meatless dishes: recipes with dried beans, peas, lentils, and tofu (soybean curd). Seeds and nuts: all seeds and most nuts.  Avoid: Prime grade and other heavily marbled and fatty meats, such as short ribs, spare ribs, rib eye roast or steak, frankfurters, sausage, bacon, and high-fat luncheon meats, mutton. Caviar. Commercially fried fish. Domestic duck, goose, venison sausage. Organ meats: liver, gizzard, heart, chitterlings, brains, kidney, sweetbreads. Dairy  Allowed: Low-fat  cheeses: nonfat or low-fat cottage cheese (1% or 2% fat), cheeses made with part skim milk, such as mozzarella, farmers, string, or ricotta. (Cheeses should be labeled no more than 2 to 6 grams fat per oz.). Skim (or 1%) milk: liquid, powdered, or evaporated. Buttermilk made with low-fat milk. Drinks made with skim or low-fat milk or cocoa. Chocolate milk or cocoa made with skim or low-fat (1%) milk. Nonfat or low-fat yogurt.  Avoid: Whole milk cheeses, including colby, cheddar, muenster, Monterey Jack, Havarti, Brie, Camembert, American, Swiss, and blue. Creamed cottage cheese, cream cheese. Whole milk and whole milk products, including buttermilk or yogurt made from whole milk, drinks made from whole milk. Condensed milk, evaporated whole milk, and 2% milk. Soups and Combination Foods  Allowed: Low-fat low-sodium soups: broth, dehydrated soups, homemade broth, soups with the fat removed, homemade cream soups made with skim or low-fat milk. Low-fat spaghetti, lasagna, chili, and Spanish rice if low-fat ingredients and low-fat cooking techniques are used.  Avoid: Cream soups made with whole milk, cream, or high-fat cheese. All other soups. Desserts and Sweets  Allowed: Sherbet, fruit ices, gelatins, meringues, and angel food cake. Homemade desserts with recommended fats, oils, and milk products. Jam, jelly, honey, marmalade, sugars, and syrups. Pure sugar candy, such as gum drops, hard candy, jelly beans, marshmallows, mints, and small amounts of dark chocolate.  Avoid: Commercially prepared   cakes, pies, cookies, frosting, pudding, or mixes for these products. Desserts containing whole milk products, chocolate, coconut, lard, palm oil, or palm kernel oil. Ice cream or ice cream drinks. Candy that contains chocolate, coconut, butter, hydrogenated fat, or unknown ingredients. Buttered syrups. Fats and Oils  Allowed: Vegetable oils: safflower, sunflower, corn, soybean, cottonseed, sesame, canola, olive,  or peanut. Non-hydrogenated margarines. Salad dressing or mayonnaise: homemade or commercial, made with a recommended oil. Low or nonfat salad dressing or mayonnaise.  Limit added fats and oils to 6 to 8 tsp per day (includes fats used in cooking, baking, salads, and spreads on bread). Remember to count the "hidden fats" in foods.  Avoid: Solid fats and shortenings: butter, lard, salt pork, bacon drippings. Gravy containing meat fat, shortening, or suet. Cocoa butter, coconut. Coconut oil, palm oil, palm kernel oil, or hydrogenated oils: these ingredients are often used in bakery products, nondairy creamers, whipped toppings, candy, and commercially fried foods. Read labels carefully. Salad dressings made of unknown oils, sour cream, or cheese, such as blue cheese and Roquefort. Cream, all kinds: half-and-half, light, heavy, or whipping. Sour cream or cream cheese (even if "light" or low-fat). Nondairy cream substitutes: coffee creamers and sour cream substitutes made with palm, palm kernel, hydrogenated oils, or coconut oil. Beverages  Allowed: Coffee (regular or decaffeinated), tea. Diet carbonated beverages, mineral water. Alcohol: Check with your caregiver. Moderation is recommended.  Avoid: Whole milk, regular sodas, and juice drinks with added sugar. Condiments  Allowed: All seasonings and condiments. Cocoa powder. "Cream" sauces made with recommended ingredients.  Avoid: Carob powder made with hydrogenated fats. SAMPLE MENU Breakfast   cup orange juice   cup oatmeal  1 slice toast  1 tsp margarine  1 cup skim milk Lunch  Turkey sandwich with 2 oz turkey, 2 slices bread  Lettuce and tomato slices  Fresh fruit  Carrot sticks  Coffee or tea Snack  Fresh fruit or low-fat crackers Dinner  3 oz lean ground beef  1 baked potato  1 tsp margarine   cup asparagus  Lettuce salad  1 tbs non-creamy dressing   cup peach slices  1 cup skim milk Document Released:  02/23/2008 Document Revised: 11/15/2011 Document Reviewed: 07/16/2013 [employer] Patient Information 2015 [employer]. This information is not intended to replace advice given to you by your health care provider. Make sure you discuss any questions you have with your health care provider.  

## 2014-04-10 ENCOUNTER — Telehealth: Payer: Self-pay | Admitting: Family

## 2014-04-10 NOTE — Telephone Encounter (Signed)
[place], [place] did not received the re-fill for omeprazole (PRILOSEC) 20 MG capsule sent on 04/08/14. Can you please re-send.

## 2014-04-11 MED ORDER — OMEPRAZOLE 20 MG PO CPDR: DELAYED_RELEASE_CAPSULE | ORAL | Status: DC

## 2014-04-11 NOTE — Telephone Encounter (Signed)
Resent for 6 months per original prescription by e-scribe.

## 2014-05-02 ENCOUNTER — Telehealth: Payer: Self-pay

## 2014-05-02 MED ORDER — GEMFIBROZIL 600 MG PO TABS: 600.0000 mg | ORAL_TABLET | Freq: Two times a day (BID) | ORAL | Status: DC

## 2014-05-02 NOTE — Telephone Encounter (Signed)
Pt previously Rx'd gemfinrozil 600mg  bid. Last filled 03/25/13 with 11 refills. Not on medication list at last OV. Labs indicate need for medication. Ok to fill this Rx?

## 2014-05-21 ENCOUNTER — Telehealth: Payer: Self-pay | Admitting: *Deleted

## 2014-05-21 MED ORDER — EPINEPHRINE 0.3 MG/0.3ML IJ SOAJ: 0.3000 mg | Freq: Once | INTRAMUSCULAR | Status: DC

## 2014-05-21 NOTE — Telephone Encounter (Signed)
Incoming fax from [facility] requesting refill of epipen 2 pk inject 0.3 mls into the muscle once.  Not on med list

## 2014-05-21 NOTE — Telephone Encounter (Signed)
Done

## 2014-06-20 ENCOUNTER — Ambulatory Visit: Payer: Self-pay | Admitting: Family

## 2014-06-27 ENCOUNTER — Encounter: Payer: Self-pay | Admitting: Family

## 2014-06-27 ENCOUNTER — Ambulatory Visit (INDEPENDENT_AMBULATORY_CARE_PROVIDER_SITE_OTHER): Payer: Medicare Other | Admitting: Family

## 2014-06-27 ENCOUNTER — Other Ambulatory Visit: Payer: Self-pay | Admitting: Family

## 2014-06-27 VITALS — BP 140/80 | HR 74 | Temp 97.7°F | Ht 64.0 in | Wt 128.6 lb

## 2014-06-27 DIAGNOSIS — E876 Hypokalemia: Secondary | ICD-10-CM | POA: Diagnosis not present

## 2014-06-27 DIAGNOSIS — I10 Essential (primary) hypertension: Secondary | ICD-10-CM | POA: Diagnosis not present

## 2014-06-27 DIAGNOSIS — R002 Palpitations: Secondary | ICD-10-CM

## 2014-06-27 DIAGNOSIS — E039 Hypothyroidism, unspecified: Secondary | ICD-10-CM | POA: Diagnosis not present

## 2014-06-27 LAB — BASIC METABOLIC PANEL
BUN: 15 mg/dL (ref 6–23)
CO2: 22 mEq/L (ref 19–32)
CREATININE: 0.88 mg/dL (ref 0.40–1.20)
Calcium: 9.6 mg/dL (ref 8.4–10.5)
Chloride: 105 mEq/L (ref 96–112)
GFR: 65.32 mL/min (ref >60.00)
Glucose, Bld: 94 mg/dL (ref 70–99)
POTASSIUM: 5 meq/L (ref 3.5–5.1)
SODIUM: 136 meq/L (ref 135–145)

## 2014-06-27 LAB — TSH: TSH: 2.99 u[IU]/mL (ref 0.35–4.50)

## 2014-06-27 MED ORDER — METOPROLOL TARTRATE 25 MG PO TABS: ORAL_TABLET | ORAL | Status: DC

## 2014-06-27 NOTE — Patient Instructions (Signed)

## 2014-06-27 NOTE — Progress Notes (Signed)
Subjective:    Patient ID: Sara Medina, female    DOB: [birth year], 81 y.o.   MRN: [id number]  HPI 81 year old white female, nonsmoker with a history of hypothyroidism, hyperlipidemia, hypertension, and cardiac dysrhythmias in today after reportedly filling and arrhythmia on 06/09/2014. Took an extra metoprolol 25 mg that calmed her symptoms within an hour. Reports she's had his episode twice prior and had metoprolol increased. Therefore, she increased her metoprolol to 50 mg in the morning and 20 5 in the evening. The symptoms have subsided. Denies any chest pain or shortness of breath. Takes aspirin 81 mg.   Review of Systems  Constitutional: Negative.   HENT: Negative.   Respiratory: Negative.   Cardiovascular: Positive for palpitations. Negative for chest pain and leg swelling.  Gastrointestinal: Negative.   Endocrine: Negative.   Genitourinary: Negative.   Musculoskeletal: Negative.   Skin: Negative.   Allergic/Immunologic: Negative.   Neurological: Negative.   Hematological: Negative.   Psychiatric/Behavioral: Negative.    Past Medical History  Diagnosis Date  . Allergy   . Asthma   . Diverticulosis   . History of diverticulitis of colon   . GERD (gastroesophageal reflux disease)   . Arthritis   . Hyperlipidemia   . Hypertension   . Thyroid disease     History   Social History  . Marital Status: Widowed    Spouse Name: N/A    Number of Children: N/A  . Years of Education: N/A   Occupational History  . retired    Social History Main Topics  . Smoking status: Never Smoker   . Smokeless tobacco: Never Used  . Alcohol Use: No  . Drug Use: No  . Sexual Activity: Not Currently   Other Topics Concern  . Not on file   Social History Narrative    Past Surgical History  Procedure Laterality Date  . Trigger thumb    . Abdominal hysterectomy    . Tonsillectomy      Family History  Problem Relation Age of Onset  . Ovarian cancer    . Cancer Mother    ovarian  . Heart disease Father   . Stroke Father   . Cancer Sister     brain    Allergies  Allergen Reactions  . Peanut-Containing Drug Products   . Penicillins     Current Outpatient Prescriptions on File Prior to Visit  Medication Sig Dispense Refill  . aspirin 81 MG tablet Take 81 mg by mouth daily.      . Cholecalciferol (VITAMIN D-3 PO) Take 1,000 Int'l Units by mouth daily.    Marland Kitchen EPINEPHrine 0.3 mg/0.3 mL IJ SOAJ injection Inject 0.3 mLs (0.3 mg total) into the muscle once. 2 Device 0  . estradiol (ESTRACE) 0.5 MG tablet Take 1 tablet (0.5 mg total) by mouth daily. 90 tablet 11  . folic acid (FOLVITE) 176 MCG tablet Take 400 mcg by mouth daily.      Marland Kitchen glucosamine-chondroitin 500-400 MG tablet Take 3 tablets by mouth daily.     Marland Kitchen levothyroxine (SYNTHROID, LEVOTHROID) 25 MCG tablet Take 1 tablet (25 mcg total) by mouth daily before breakfast. 90 tablet 3  . metoprolol tartrate (LOPRESSOR) 25 MG tablet Take 1 tablet (25 mg total) by mouth 2 (two) times daily. 180 tablet 11  . omeprazole (PRILOSEC) 20 MG capsule TAKE ONE CAPSULE BY MOUTH DAILY 90 capsule 1  . Potassium Chloride ER 20 MEQ TBCR TAKE 3 TABLETS BY MOUTH DAILY. 90 tablet 3  .  PROAIR HFA 108 (90 BASE) MCG/ACT inhaler INHALE 2 PUFFS INTO THE LUNGS EVERY 6 (SIX) HOURS AS NEEDED FOR WHEEZING OR SHORTNESS OF BREATH. 8.5 g 2  . vitamin B-12 (CYANOCOBALAMIN) 500 MCG tablet Take 1,000 mcg by mouth 2 (two) times daily.      No current facility-administered medications on file prior to visit.    BP 140/80 mmHg  Pulse 74  Temp(Src) 97.7 F (36.5 C) (Oral)  Ht 5\' 4"  (1.626 m)  Wt 128 lb 9.6 oz (58.333 kg)  BMI 22.06 kg/m2chart We'll decide 18    Objective:   Physical Exam  Constitutional: She is oriented to person, place, and time.  HENT:  Right Ear: External ear normal.  Left Ear: External ear normal.  Nose: Nose normal.  Mouth/Throat: Oropharynx is clear and moist.  Neck: Normal range of motion. Neck supple. No  thyromegaly present.  Cardiovascular: Normal rate, regular rhythm and normal heart sounds.   Pulmonary/Chest: Effort normal and breath sounds normal.  Abdominal: Soft. Bowel sounds are normal.  Musculoskeletal: Normal range of motion.  Neurological: She is alert and oriented to person, place, and time.  Skin: Skin is warm and dry.  Psychiatric: She has a normal mood and affect.           Assessment & Plan:  Sara Medina was seen today for no specified reason.  Diagnoses and associated orders for this visit:  Palpitations - EKG 12-Lead - TSH - Basic Metabolic Panel - POCT urinalysis dipstick - Ambulatory referral to Cardiology  Hypokalemia - TSH - Basic Metabolic Panel - POCT urinalysis dipstick  Essential hypertension - TSH - Basic Metabolic Panel - POCT urinalysis dipstick - Ambulatory referral to Cardiology  Hypothyroidism, unspecified hypothyroidism type - TSH - Basic Metabolic Panel - POCT urinalysis dipstick    Encouraged patient to go to the emergency department with episodes as this occur. Will refer to cardiology to establish a relationship. Labs obtained today to be sure she does not have an electrolyte imbalance her thyroid is not abnormal. Follow-up here as scheduled.

## 2014-06-27 NOTE — Progress Notes (Signed)
Pre visit review using our clinic review tool, if applicable. No additional management support is needed unless otherwise documented below in the visit note. 

## 2014-06-30 ENCOUNTER — Encounter: Payer: Self-pay | Admitting: *Deleted

## 2014-06-30 ENCOUNTER — Encounter: Payer: Self-pay | Admitting: Cardiology

## 2014-06-30 ENCOUNTER — Ambulatory Visit (INDEPENDENT_AMBULATORY_CARE_PROVIDER_SITE_OTHER): Payer: Medicare Other | Admitting: Cardiology

## 2014-06-30 VITALS — BP 140/60 | HR 60 | Ht 64.0 in | Wt 127.7 lb

## 2014-06-30 DIAGNOSIS — I10 Essential (primary) hypertension: Secondary | ICD-10-CM | POA: Diagnosis not present

## 2014-06-30 DIAGNOSIS — Z8679 Personal history of other diseases of the circulatory system: Secondary | ICD-10-CM | POA: Diagnosis not present

## 2014-06-30 DIAGNOSIS — R002 Palpitations: Secondary | ICD-10-CM

## 2014-06-30 HISTORY — DX: Palpitations: R00.2

## 2014-06-30 NOTE — Assessment & Plan Note (Signed)
Check echocardiogram 

## 2014-06-30 NOTE — Assessment & Plan Note (Signed)
Continue beta blocker. Scheduled [facility]. Recent TSH and potassium normal.

## 2014-06-30 NOTE — Assessment & Plan Note (Signed)
Management per primary care. 

## 2014-06-30 NOTE — Assessment & Plan Note (Signed)
Blood pressure controlled. Continue present medications. 

## 2014-06-30 NOTE — Progress Notes (Signed)
HPI: 81 year old female for evaluation of palpitations. Laboratories 06/27/2013 showed normal TSH and potassium. Patient had her first episode of palpitations approximately 8 years ago in [place]. She was seen by cardiologist and apparently had a negative stress test. She was told she had mitral valve prolapse and placed on a beta blocker with improvement. However she has had 2 recent episodes and we are asked to evaluate. Her episodes are described as her heart skipping. There is associated "hollow feeling" in her chest. There is mild dyspnea. No syncope. She otherwise does not have dyspnea on exertion, orthopnea, PND, pedal edema or exertional chest pain.  Current Outpatient Prescriptions  Medication Sig Dispense Refill  . aspirin 81 MG tablet Take 81 mg by mouth daily.      . Cholecalciferol (VITAMIN D-3 PO) Take 1,000 Int'l Units by mouth daily.    Marland Kitchen EPINEPHrine 0.3 mg/0.3 mL IJ SOAJ injection Inject 0.3 mLs (0.3 mg total) into the muscle once. 2 Device 0  . estradiol (ESTRACE) 0.5 MG tablet Take 1 tablet (0.5 mg total) by mouth daily. 90 tablet 11  . folic acid (FOLVITE) 124 MCG tablet Take 400 mcg by mouth daily.      Marland Kitchen glucosamine-chondroitin 500-400 MG tablet Take 3 tablets by mouth daily.     Marland Kitchen levothyroxine (SYNTHROID, LEVOTHROID) 25 MCG tablet Take 1 tablet (25 mcg total) by mouth daily before breakfast. 90 tablet 3  . metoprolol tartrate (LOPRESSOR) 25 MG tablet 50 mg in the morning and 25 in the evening (Patient taking differently: 75 mg in the morning and 50 in the evening) 270 tablet 3  . omeprazole (PRILOSEC) 20 MG capsule TAKE ONE CAPSULE BY MOUTH DAILY 90 capsule 1  . Potassium Chloride ER 20 MEQ TBCR TAKE 3 TABLETS BY MOUTH DAILY. 90 tablet 3  . PROAIR HFA 108 (90 BASE) MCG/ACT inhaler INHALE 2 PUFFS INTO THE LUNGS EVERY 6 (SIX) HOURS AS NEEDED FOR WHEEZING OR SHORTNESS OF BREATH. 8.5 g 2  . vitamin B-12 (CYANOCOBALAMIN) 500 MCG tablet Take 1,000 mcg by mouth 2 (two)  times daily.      No current facility-administered medications for this visit.    Allergies  Allergen Reactions  . Peanut-Containing Drug Products   . Penicillins      Past Medical History  Diagnosis Date  . Allergy   . Asthma   . Diverticulosis   . History of diverticulitis of colon   . GERD (gastroesophageal reflux disease)   . Arthritis   . Hyperlipidemia   . Hypertension   . Thyroid disease   . Mitral valve prolapse     Past Surgical History  Procedure Laterality Date  . Trigger thumb    . Abdominal hysterectomy    . Tonsillectomy    . Shoulder surgery      History   Social History  . Marital Status: Widowed    Spouse Name: N/A    Number of Children: 3  . Years of Education: N/A   Occupational History  . retired    Social History Main Topics  . Smoking status: Never Smoker   . Smokeless tobacco: Never Used  . Alcohol Use: 0.0 oz/week    0 Not specified per week     Comment: Couple glasses of wine per week  . Drug Use: No  . Sexual Activity: Not Currently   Other Topics Concern  . Not on file   Social History Narrative    Family History  Problem  Relation Age of Onset  . Ovarian cancer    . Cancer Mother     ovarian  . Heart disease Father   . Stroke Father   . Cancer Sister     brain    ROS: no fevers or chills, productive cough, hemoptysis, dysphasia, odynophagia, melena, hematochezia, dysuria, hematuria, rash, seizure activity, orthopnea, PND, pedal edema, claudication. Remaining systems are negative.  Physical Exam:   Blood pressure 140/60, pulse 60, height 5\' 4"  (1.626 m), weight 127 lb 11.2 oz (57.924 kg).  General:  Well developed/well nourished in NAD Skin warm/dry Patient not depressed No peripheral clubbing Back-normal HEENT-normal/normal eyelids Neck supple/normal carotid upstroke bilaterally; no bruits; no JVD; no thyromegaly chest - CTA/ normal expansion CV - RRR/normal S1 and S2; no murmurs, rubs or gallops;  PMI  nondisplaced Abdomen -NT/ND, no HSM, no mass, + bowel sounds, no bruit 2+ femoral pulses, no bruits Ext-no edema, chords, 2+ DP Neuro-grossly nonfocal  ECG 06/27/2014-sinus rhythm with first-degree AV block.

## 2014-06-30 NOTE — Patient Instructions (Signed)
Your physician recommends that you schedule a follow-up appointment in: [place] has requested that you have an echocardiogram. Echocardiography is a painless test that uses sound waves to create images of your heart. It provides your doctor with information about the size and shape of your heart and how well your heart's chambers and valves are working. This procedure takes approximately one hour. There are no restrictions for this procedure.AT [place]  Your physician has recommended that you wear an event monitor. Event monitors are medical devices that record the heart's electrical activity. Doctors most often [place] these monitors to diagnose arrhythmias. Arrhythmias are problems with the speed or rhythm of the heartbeat. The monitor is a small, portable device. You can wear one while you do your normal daily activities. This is usually used to diagnose what is causing palpitations/syncope (passing out).AT [place]

## 2014-07-01 ENCOUNTER — Encounter: Payer: Self-pay | Admitting: Family

## 2014-07-01 ENCOUNTER — Telehealth: Payer: Self-pay | Admitting: Family

## 2014-07-01 NOTE — Telephone Encounter (Signed)
See below and ask Dr. Elease Hashimoto please.    Both my daughters Manfred Arch and Vilma Prader) and their families are patients of Dr. Sheldon Silvan. If possible,  I would also like to go to him. Can you arrange this for me? I think it would be wise to keep the whole family  under "one roof" if possible. Please let me know what I need to do to make this happen. Thank you.   PC

## 2014-07-02 NOTE — Telephone Encounter (Signed)
OK 

## 2014-07-02 NOTE — Telephone Encounter (Signed)
Wishes to schedule with Dr. Elease Hashimoto. Ok to schedule per Dr. B. 30 min OV in 3 months.

## 2014-07-04 NOTE — Telephone Encounter (Signed)
Pt has been sch

## 2014-07-06 ENCOUNTER — Encounter: Payer: Self-pay | Admitting: Family

## 2014-07-09 ENCOUNTER — Ambulatory Visit (HOSPITAL_COMMUNITY): Payer: Medicare Other | Attending: Cardiology | Admitting: Cardiology

## 2014-07-09 ENCOUNTER — Encounter (INDEPENDENT_AMBULATORY_CARE_PROVIDER_SITE_OTHER): Payer: Medicare Other

## 2014-07-09 ENCOUNTER — Encounter: Payer: Self-pay | Admitting: *Deleted

## 2014-07-09 DIAGNOSIS — R002 Palpitations: Secondary | ICD-10-CM

## 2014-07-09 DIAGNOSIS — I10 Essential (primary) hypertension: Secondary | ICD-10-CM

## 2014-07-09 NOTE — Progress Notes (Signed)
Echo performed. 

## 2014-07-09 NOTE — Progress Notes (Signed)
Patient ID: Sara Medina, female   DOB: [birth year], 81 y.o.   MRN: [id number] [employer] 30 day cardiac event monitor to be applied to patient.

## 2014-07-23 ENCOUNTER — Other Ambulatory Visit: Payer: Self-pay | Admitting: Family

## 2014-07-29 DIAGNOSIS — M5136 Other intervertebral disc degeneration, lumbar region: Secondary | ICD-10-CM | POA: Diagnosis not present

## 2014-07-29 DIAGNOSIS — M9903 Segmental and somatic dysfunction of lumbar region: Secondary | ICD-10-CM | POA: Diagnosis not present

## 2014-07-29 DIAGNOSIS — M47816 Spondylosis without myelopathy or radiculopathy, lumbar region: Secondary | ICD-10-CM | POA: Diagnosis not present

## 2014-07-29 DIAGNOSIS — M545 Low back pain: Secondary | ICD-10-CM | POA: Diagnosis not present

## 2014-08-12 DIAGNOSIS — M5136 Other intervertebral disc degeneration, lumbar region: Secondary | ICD-10-CM | POA: Diagnosis not present

## 2014-08-12 DIAGNOSIS — M545 Low back pain: Secondary | ICD-10-CM | POA: Diagnosis not present

## 2014-08-12 DIAGNOSIS — M47816 Spondylosis without myelopathy or radiculopathy, lumbar region: Secondary | ICD-10-CM | POA: Diagnosis not present

## 2014-08-12 DIAGNOSIS — M9903 Segmental and somatic dysfunction of lumbar region: Secondary | ICD-10-CM | POA: Diagnosis not present

## 2014-08-15 ENCOUNTER — Telehealth: Payer: Self-pay | Admitting: *Deleted

## 2014-08-15 NOTE — Telephone Encounter (Signed)
Monitor reviewed by dr Stanford Breed shows sinus with rare PAC. Left message for pt to call

## 2014-08-15 NOTE — Telephone Encounter (Signed)
Spoke with pt, .aware of results.  

## 2014-08-18 DIAGNOSIS — F4321 Adjustment disorder with depressed mood: Secondary | ICD-10-CM | POA: Diagnosis not present

## 2014-09-01 ENCOUNTER — Other Ambulatory Visit: Payer: Self-pay

## 2014-09-01 DIAGNOSIS — Z1231 Encounter for screening mammogram for malignant neoplasm of breast: Secondary | ICD-10-CM

## 2014-09-15 DIAGNOSIS — F4321 Adjustment disorder with depressed mood: Secondary | ICD-10-CM | POA: Diagnosis not present

## 2014-09-19 ENCOUNTER — Other Ambulatory Visit: Payer: Self-pay | Admitting: *Deleted

## 2014-09-19 DIAGNOSIS — E039 Hypothyroidism, unspecified: Secondary | ICD-10-CM

## 2014-09-19 MED ORDER — ALBUTEROL SULFATE HFA 108 (90 BASE) MCG/ACT IN AERS: INHALATION_SPRAY | RESPIRATORY_TRACT | Status: DC

## 2014-09-19 MED ORDER — LEVOTHYROXINE SODIUM 25 MCG PO TABS: 25.0000 ug | ORAL_TABLET | Freq: Every day | ORAL | Status: DC

## 2014-09-25 NOTE — Progress Notes (Signed)
HPI: FU palpitations. Patient had her first episode of palpitations years ago in [place]. She was seen by cardiologist and apparently had a negative stress test. She was told she had mitral valve prolapse and placed on a beta blocker with improvement. Echocardiogram February 2016 showed normal LV function, grade 1 diastolic dysfunction, mild mitral and tricuspid regurgitation and trace aortic insufficiency. Monitor in March 2016 showed sinus with PACs. Since last seen she is doing well with no chest pain, dyspnea or syncope. Occasional brief skip but no sustained palpitations.  Current Outpatient Prescriptions  Medication Sig Dispense Refill  . albuterol (PROAIR HFA) 108 (90 BASE) MCG/ACT inhaler INHALE 2 PUFFS INTO THE LUNGS EVERY 6 (SIX) HOURS AS NEEDED FOR WHEEZING OR SHORTNESS OF BREATH. 8.5 g 5  . aspirin 81 MG tablet Take 81 mg by mouth daily.      . Cholecalciferol (VITAMIN D-3 PO) Take 1,000 Int'l Units by mouth daily.    Marland Kitchen EPINEPHrine 0.3 mg/0.3 mL IJ SOAJ injection Inject 0.3 mLs (0.3 mg total) into the muscle once. 2 Device 0  . estradiol (ESTRACE) 0.5 MG tablet Take 1 tablet (0.5 mg total) by mouth daily. 90 tablet 11  . folic acid (FOLVITE) 696 MCG tablet Take 400 mcg by mouth daily.      Marland Kitchen glucosamine-chondroitin 500-400 MG tablet Take 3 tablets by mouth daily.     Marland Kitchen levothyroxine (SYNTHROID, LEVOTHROID) 25 MCG tablet Take 1 tablet (25 mcg total) by mouth daily before breakfast. 90 tablet 1  . metoprolol tartrate (LOPRESSOR) 25 MG tablet 50 mg in the morning and 25 in the evening (Patient taking differently: 75 mg in the morning and 50 in the evening) 270 tablet 3  . omeprazole (PRILOSEC) 20 MG capsule TAKE ONE CAPSULE BY MOUTH DAILY 90 capsule 1  . potassium chloride (KLOR-CON) 20 MEQ packet Take 20 mEq by mouth daily.    . [place] Wort 300 MG TABS Take 2 tablets by mouth 3 (three) times daily.    . vitamin B-12 (CYANOCOBALAMIN) 500 MCG tablet Take 1,000 mcg by mouth 2  (two) times daily.      No current facility-administered medications for this visit.     Past Medical History  Diagnosis Date  . Allergy   . Asthma   . Diverticulosis   . History of diverticulitis of colon   . GERD (gastroesophageal reflux disease)   . Arthritis   . Hyperlipidemia   . Hypertension   . Thyroid disease   . Mitral valve prolapse     Past Surgical History  Procedure Laterality Date  . Trigger thumb    . Abdominal hysterectomy    . Tonsillectomy    . Shoulder surgery      History   Social History  . Marital Status: Widowed    Spouse Name: N/A  . Number of Children: 3  . Years of Education: N/A   Occupational History  . retired    Social History Main Topics  . Smoking status: Never Smoker   . Smokeless tobacco: Never Used  . Alcohol Use: 0.0 oz/week    0 Standard drinks or equivalent per week     Comment: Couple glasses of wine per week  . Drug Use: No  . Sexual Activity: Not Currently   Other Topics Concern  . Not on file   Social History Narrative    ROS: no fevers or chills, productive cough, hemoptysis, dysphasia, odynophagia, melena, hematochezia, dysuria, hematuria, rash,  seizure activity, orthopnea, PND, pedal edema, claudication. Remaining systems are negative.  Physical Exam: Well-developed well-nourished in no acute distress.  Skin is warm and dry.  HEENT is normal.  Neck is supple.  Chest is clear to auscultation with normal expansion.  Cardiovascular exam is regular rate and rhythm.  Abdominal exam nontender or distended. No masses palpated. Extremities show no edema. neuro grossly intact

## 2014-09-29 DIAGNOSIS — F4321 Adjustment disorder with depressed mood: Secondary | ICD-10-CM | POA: Diagnosis not present

## 2014-09-30 ENCOUNTER — Encounter: Payer: Self-pay | Admitting: Cardiology

## 2014-09-30 ENCOUNTER — Ambulatory Visit (INDEPENDENT_AMBULATORY_CARE_PROVIDER_SITE_OTHER): Payer: Medicare Other | Admitting: Cardiology

## 2014-09-30 VITALS — BP 152/70 | HR 73 | Ht 64.0 in | Wt 126.4 lb

## 2014-09-30 DIAGNOSIS — I10 Essential (primary) hypertension: Secondary | ICD-10-CM

## 2014-09-30 DIAGNOSIS — R002 Palpitations: Secondary | ICD-10-CM

## 2014-09-30 NOTE — Patient Instructions (Signed)
Your physician recommends that you schedule a follow-up appointment in: AS NEEDED  

## 2014-09-30 NOTE — Assessment & Plan Note (Signed)
Management per primary care. 

## 2014-09-30 NOTE — Assessment & Plan Note (Signed)
Blood pressure mildly elevated. However she follows this at home and typically control. Continue present medications.

## 2014-09-30 NOTE — Assessment & Plan Note (Signed)
Controlled with metoprolol. LV function normal. Monitor showed PACs.

## 2014-10-02 ENCOUNTER — Encounter: Payer: Self-pay | Admitting: Family Medicine

## 2014-10-02 ENCOUNTER — Ambulatory Visit (INDEPENDENT_AMBULATORY_CARE_PROVIDER_SITE_OTHER): Payer: Medicare Other | Admitting: Family Medicine

## 2014-10-02 VITALS — BP 130/84 | Temp 97.5°F | Wt 127.0 lb

## 2014-10-02 DIAGNOSIS — E038 Other specified hypothyroidism: Secondary | ICD-10-CM

## 2014-10-02 DIAGNOSIS — IMO0002 Reserved for concepts with insufficient information to code with codable children: Secondary | ICD-10-CM

## 2014-10-02 DIAGNOSIS — N8110 Cystocele, unspecified: Secondary | ICD-10-CM | POA: Diagnosis not present

## 2014-10-02 HISTORY — DX: Reserved for concepts with insufficient information to code with codable children: IMO0002

## 2014-10-02 HISTORY — DX: Cystocele, unspecified: N81.10

## 2014-10-02 NOTE — Patient Instructions (Addendum)
Try reducing omeprazole to one every other day. If symptoms stable after 3 weeks consider discontinuing at that time    About Cystocele  Overview  The pelvic organs, including the bladder, are normally supported by pelvic floor muscles and ligaments.  When these muscles and ligaments are stretched, weakened or torn, the wall between the bladder and the vagina sags or herniates causing a prolapse, sometimes called a cystocele.  This condition may cause discomfort and problems with emptying the bladder.  It can be present in various stages.  Some people are not aware of the changes.  Others may notice changes at the vaginal opening or a feeling of the bladder dropping outside the body.  Causes of a Cystocele  A cystocele is usually caused by muscle straining or stretching during childbirth.  In addition, cystocele is more common after menopause, because the hormone estrogen helps keep the elastic tissues around the pelvic organs strong.  A cystocele is more likely to occur when levels of estrogen decrease.  Other causes include: heavy lifting, chronic coughing, previous pelvic surgery and obesity.  Symptoms  A bladder that has dropped from its normal position may cause: unwanted urine leakage (stress incontinence), frequent urination or urge to urinate, incomplete emptying of the bladder (not feeling bladder relief after emptying), pain or discomfort in the vagina, pelvis, groin, lower back or lower abdomen and frequent urinary tract infections.  Mild cases may not cause any symptoms.  Treatment Options  Pelvic floor (Kegel) exercises:  Strength training the muscles in your genital area  Behavioral changes: Treating and preventing constipation, taking time to empty your bladder properly, learning to lift properly and/or avoid heavy lifting when possible, stopping smoking, avoiding weight gain and treating a chronic cough or bronchitis.  A pessary: A vaginal support device is sometimes used to  help pelvic support caused by muscle and ligament changes.  Surgery: Surgical repair may be necessary if symptoms cannot be managed with exercise, behavioral changes and a pessary.  Surgery is usually considered for severe cases.   2007, [employer]

## 2014-10-02 NOTE — Progress Notes (Signed)
Pre visit review using our clinic review tool, if applicable. No additional management support is needed unless otherwise documented below in the visit note. 

## 2014-10-02 NOTE — Progress Notes (Signed)
   Subjective:    Patient ID: Sara Medina, female    DOB: [birth year], 81 y.o.   MRN: [id number]  HPI Patient here to establish care.  Hypothyroidism. On low-dose levothyroxine. TSH was normal back in January.  History of palpitations and hypertension treated with metoprolol. History of PACs on previous monitoring. Check echocardiogram febrile 2016 which showed grade 1 diastolic dysfunction but no other major abnormalities. Followed by cardiology She stays very active. Exercises several days per week  History of GERD. Currently stable. His maternal omeprazole several years never tried tapering off. Prior history of hypokalemia but she's having diarrhea during that time. She still takes 1 potassium supplement daily. No diuretic use.  Recently noted cystic type swelling vulvar region. No pain. Occasional stress incontinence with standing  Past Medical History  Diagnosis Date  . Allergy   . Asthma   . Diverticulosis   . History of diverticulitis of colon   . GERD (gastroesophageal reflux disease)   . Arthritis   . Hyperlipidemia   . Hypertension   . Thyroid disease   . Mitral valve prolapse    Past Surgical History  Procedure Laterality Date  . Trigger thumb    . Abdominal hysterectomy    . Tonsillectomy    . Shoulder surgery      reports that she has never smoked. She has never used smokeless tobacco. She reports that she drinks alcohol. She reports that she does not use illicit drugs. family history includes Cancer in her mother and sister; Heart disease in her father; Ovarian cancer in an other family member; Stroke in her father. Allergies  Allergen Reactions  . Peanut-Containing Drug Products   . Penicillins       Review of Systems  Constitutional: Negative for fever, appetite change, fatigue and unexpected weight change.  Eyes: Negative for visual disturbance.  Respiratory: Negative for cough, chest tightness, shortness of breath and wheezing.   Cardiovascular:  Negative for chest pain, palpitations and leg swelling.  Endocrine: Negative for polydipsia and polyuria.  Genitourinary: Negative for dysuria.  Neurological: Negative for dizziness, seizures, syncope, weakness, light-headedness and headaches.       Objective:   Physical Exam  Constitutional: She appears well-developed and well-nourished.  Neck: Neck supple. No JVD present. No thyromegaly present.  Cardiovascular: Normal rate and regular rhythm.  Exam reveals no gallop.   Pulmonary/Chest: Effort normal and breath sounds normal. No respiratory distress. She has no wheezes. She has no rales.  Genitourinary:  Vagina appears normal. No abnormal discharge. She has small to moderate size cystocele which is noted especially with cough or strain  Musculoskeletal: She exhibits no edema.          Assessment & Plan:  #1 hypothyroidism. Recent TSH at goal. Continue current dose levothyroxin and recheck TSH in 6 months #2 history of palpitations. Currently stable on metoprolol #3 history of GERD symptoms medically stable. We suggest that she try reduce her omeprazole to one every other day for a few weeks and then if symptoms stable consider discontinuing #4 Cystocele.  Pt describes occasional stress incontinence symptoms. Symptoms are relatively mild at this time. We discussed possible urologic referral. She is not interested at this time. She is already doing some Kegle exercises

## 2014-10-07 ENCOUNTER — Ambulatory Visit: Payer: Medicare Other | Admitting: Family

## 2014-10-07 DIAGNOSIS — H26493 Other secondary cataract, bilateral: Secondary | ICD-10-CM | POA: Diagnosis not present

## 2014-10-13 DIAGNOSIS — F4321 Adjustment disorder with depressed mood: Secondary | ICD-10-CM | POA: Diagnosis not present

## 2014-10-16 ENCOUNTER — Ambulatory Visit: Payer: Medicare Other

## 2014-10-16 ENCOUNTER — Ambulatory Visit
Admission: RE | Admit: 2014-10-16 | Discharge: 2014-10-16 | Disposition: A | Discharge status: Home / Self Care | Payer: Medicare Other | Source: Ambulatory Visit

## 2014-10-16 DIAGNOSIS — Z1231 Encounter for screening mammogram for malignant neoplasm of breast: Secondary | ICD-10-CM | POA: Diagnosis not present

## 2014-10-17 ENCOUNTER — Other Ambulatory Visit: Payer: Self-pay | Admitting: Family

## 2014-11-04 ENCOUNTER — Ambulatory Visit (INDEPENDENT_AMBULATORY_CARE_PROVIDER_SITE_OTHER): Payer: Medicare Other | Admitting: Family Medicine

## 2014-11-04 ENCOUNTER — Encounter: Payer: Self-pay | Admitting: Family Medicine

## 2014-11-04 VITALS — BP 120/80 | HR 81 | Temp 97.5°F | Wt 120.9 lb

## 2014-11-04 DIAGNOSIS — R197 Diarrhea, unspecified: Secondary | ICD-10-CM

## 2014-11-04 LAB — CBC WITH DIFFERENTIAL/PLATELET
Basophils Absolute: 0 10*3/uL (ref 0.0–0.1)
Basophils Relative: 0.4 % (ref 0.0–3.0)
EOS PCT: 1 % (ref 0.0–5.0)
Eosinophils Absolute: 0.1 10*3/uL (ref 0.0–0.7)
HCT: 43.6 % (ref 36.0–46.0)
Hemoglobin: 14.6 g/dL (ref 12.0–15.0)
LYMPHS ABS: 1.9 10*3/uL (ref 0.7–4.0)
LYMPHS PCT: 24.4 % (ref 12.0–46.0)
MCHC: 33.5 g/dL (ref 30.0–36.0)
MCV: 93.1 fl (ref 78.0–100.0)
MONO ABS: 0.9 10*3/uL (ref 0.1–1.0)
MONOS PCT: 11.1 % (ref 3.0–12.0)
NEUTROS ABS: 4.9 10*3/uL (ref 1.4–7.7)
Neutrophils Relative %: 63.1 % (ref 43.0–77.0)
PLATELETS: 362 10*3/uL (ref 150.0–400.0)
RBC: 4.68 Mil/uL (ref 3.87–5.11)
RDW: 13 % (ref 11.5–15.5)
WBC: 7.8 10*3/uL (ref 4.0–10.5)

## 2014-11-04 LAB — BASIC METABOLIC PANEL
BUN: 19 mg/dL (ref 6–23)
CO2: 21 mEq/L (ref 19–32)
CREATININE: 0.89 mg/dL (ref 0.40–1.20)
Calcium: 9.8 mg/dL (ref 8.4–10.5)
Chloride: 102 mEq/L (ref 96–112)
GFR: 64.41 mL/min (ref >60.00)
Glucose, Bld: 120 mg/dL — ABNORMAL HIGH (ref 70–99)
Potassium: 2.9 mEq/L — ABNORMAL LOW (ref 3.5–5.1)
Sodium: 133 mEq/L — ABNORMAL LOW (ref 135–145)

## 2014-11-04 MED ORDER — METRONIDAZOLE 500 MG PO TABS
500.0000 mg | ORAL_TABLET | Freq: Three times a day (TID) | ORAL | Status: DC
Start: 2014-11-04 — End: 2015-04-14

## 2014-11-04 MED ORDER — DIPHENOXYLATE-ATROPINE 2.5-0.025 MG PO TABS: 2.0000 | ORAL_TABLET | Freq: Four times a day (QID) | ORAL | Status: DC | PRN

## 2014-11-04 NOTE — Progress Notes (Signed)
Pre visit review using our clinic review tool, if applicable. No additional management support is needed unless otherwise documented below in the visit note. 

## 2014-11-04 NOTE — Progress Notes (Signed)
   Subjective:    Patient ID: Sara Medina, female    DOB: 1929-11-19, 81 y.o.   MRN: [id number]  HPI Here for 4 days of diarrhea, fatigue, and mild nausea. No fever. Some mild intermittent abdominal cramps but nothing severe. She is drinking water. No recent travel. She is concerned about possible Lyme disease because she had a tick bite 2 months ago that developed a red ring around the site. No other rashes.    Review of Systems  Constitutional: Positive for fatigue. Negative for fever, chills and diaphoresis.  Respiratory: Negative.   Cardiovascular: Negative.   Gastrointestinal: Positive for nausea, abdominal pain and diarrhea. Negative for vomiting, constipation, blood in stool, abdominal distention, anal bleeding and rectal pain.  Genitourinary: Negative.        Objective:   Physical Exam  Constitutional: She appears well-developed and well-nourished. No distress.  Cardiovascular: Normal rate, regular rhythm, normal heart sounds and intact distal pulses.   Pulmonary/Chest: Effort normal and breath sounds normal.  Abdominal: Soft. Bowel sounds are normal. She exhibits no distension and no mass. There is no tenderness. There is no rebound and no guarding.          Assessment & Plan:  Enteritis, cover with Flagyl 500 mg tid. Drink Gatorade with water to keep the electrolytes up. Use Lomotil prn. Get labs today.

## 2014-11-05 LAB — B. BURGDORFI ANTIBODIES: B burgdorferi Ab IgG+IgM: 0.03 {ISR}

## 2014-11-06 ENCOUNTER — Encounter: Payer: Self-pay | Admitting: Family Medicine

## 2014-11-09 ENCOUNTER — Encounter: Payer: Self-pay | Admitting: Family Medicine

## 2014-11-10 ENCOUNTER — Encounter: Payer: Self-pay | Admitting: Family Medicine

## 2014-11-10 ENCOUNTER — Ambulatory Visit (INDEPENDENT_AMBULATORY_CARE_PROVIDER_SITE_OTHER): Payer: Medicare Other | Admitting: Family Medicine

## 2014-11-10 ENCOUNTER — Other Ambulatory Visit (INDEPENDENT_AMBULATORY_CARE_PROVIDER_SITE_OTHER): Payer: Medicare Other

## 2014-11-10 VITALS — BP 150/78 | HR 60 | Temp 97.7°F | Wt 125.5 lb

## 2014-11-10 DIAGNOSIS — R197 Diarrhea, unspecified: Secondary | ICD-10-CM | POA: Diagnosis not present

## 2014-11-10 DIAGNOSIS — F4321 Adjustment disorder with depressed mood: Secondary | ICD-10-CM | POA: Diagnosis not present

## 2014-11-10 DIAGNOSIS — I10 Essential (primary) hypertension: Secondary | ICD-10-CM

## 2014-11-10 LAB — BASIC METABOLIC PANEL
BUN: 18 mg/dL (ref 6–23)
CALCIUM: 9.3 mg/dL (ref 8.4–10.5)
CO2: 24 mEq/L (ref 19–32)
Chloride: 108 mEq/L (ref 96–112)
Creatinine, Ser: 0.72 mg/dL (ref 0.40–1.20)
GFR: 82.26 mL/min (ref >60.00)
Glucose, Bld: 98 mg/dL (ref 70–99)
POTASSIUM: 4.6 meq/L (ref 3.5–5.1)
SODIUM: 136 meq/L (ref 135–145)

## 2014-11-10 NOTE — Patient Instructions (Signed)

## 2014-11-10 NOTE — Progress Notes (Signed)
Pre visit review using our clinic review tool, if applicable. No additional management support is needed unless otherwise documented below in the visit note. 

## 2014-11-10 NOTE — Progress Notes (Signed)
   Subjective:    Patient ID: Sara Medina, female    DOB: 1929-07-12, 81 y.o.   MRN: [id number]  HPI Patient seen with about 10 day history of some diarrhea. Her symptoms were much worse last week. She was seen and treated with Flagyl but cannot take secondary to intolerance. She only took two doses. She had potassium 2.9 but had repeat earlier today with increased replacement back up to 4.6. She feels much better overall. Weight is back up 5 pounds. Diarrhea slowly improving. No bloody stools. No abdominal pain. No fevers or chills. Daughter states she's had a tendency toward diarrhea all of her life. No recent weight changes prior to this acute flare.  Using Lomotil which helps   Review of Systems  Constitutional: Negative for fever, chills and appetite change.  Respiratory: Negative for shortness of breath.   Gastrointestinal: Positive for diarrhea. Negative for nausea, vomiting, abdominal pain and blood in stool.       Objective:   Physical Exam  Constitutional: She appears well-developed and well-nourished.  HENT:  Mouth/Throat: Oropharynx is clear and moist.  Cardiovascular: Normal rate and regular rhythm.   Pulmonary/Chest: Effort normal and breath sounds normal. No respiratory distress. She has no wheezes. She has no rales.  Abdominal: Soft. Bowel sounds are normal. She exhibits no distension and no mass. There is no tenderness. There is no rebound and no guarding.          Assessment & Plan:  Diarrhea. Probable viral illness. Improving. Hypokalemia resolved. Continue as needed Lomotil. Observe for now. Touch base in one week if diarrhea not fully resolving. Handout given on food choices for managing diarrhea

## 2014-11-20 ENCOUNTER — Other Ambulatory Visit: Payer: Self-pay | Admitting: Family Medicine

## 2014-12-15 DIAGNOSIS — F4321 Adjustment disorder with depressed mood: Secondary | ICD-10-CM | POA: Diagnosis not present

## 2015-01-19 DIAGNOSIS — F4321 Adjustment disorder with depressed mood: Secondary | ICD-10-CM | POA: Diagnosis not present

## 2015-02-26 ENCOUNTER — Other Ambulatory Visit: Payer: Self-pay | Admitting: Family Medicine

## 2015-02-26 NOTE — Telephone Encounter (Signed)
[place], [place] [phone]  Requesting refill of estradiol (ESTRACE) 0.5 MG tablet

## 2015-02-27 MED ORDER — ESTRADIOL 0.5 MG PO TABS
0.5000 mg | ORAL_TABLET | Freq: Every day | ORAL | Status: DC
Start: 2015-02-27 — End: 2015-05-28

## 2015-02-27 NOTE — Telephone Encounter (Signed)
Rx sent to pharmacy   

## 2015-03-09 ENCOUNTER — Ambulatory Visit (INDEPENDENT_AMBULATORY_CARE_PROVIDER_SITE_OTHER): Payer: Medicare Other | Admitting: Family Medicine

## 2015-03-09 DIAGNOSIS — Z23 Encounter for immunization: Secondary | ICD-10-CM

## 2015-03-11 ENCOUNTER — Other Ambulatory Visit: Payer: Self-pay | Admitting: Family

## 2015-04-14 ENCOUNTER — Encounter: Payer: Self-pay | Admitting: Family Medicine

## 2015-04-14 ENCOUNTER — Ambulatory Visit (INDEPENDENT_AMBULATORY_CARE_PROVIDER_SITE_OTHER): Payer: Medicare Other | Admitting: Family Medicine

## 2015-04-14 VITALS — BP 140/78 | HR 67 | Temp 97.3°F | Resp 16 | Ht 64.0 in | Wt 120.5 lb

## 2015-04-14 DIAGNOSIS — E039 Hypothyroidism, unspecified: Secondary | ICD-10-CM | POA: Diagnosis not present

## 2015-04-14 DIAGNOSIS — Z0000 Encounter for general adult medical examination without abnormal findings: Secondary | ICD-10-CM | POA: Diagnosis not present

## 2015-04-14 DIAGNOSIS — Z780 Asymptomatic menopausal state: Secondary | ICD-10-CM | POA: Diagnosis not present

## 2015-04-14 DIAGNOSIS — I10 Essential (primary) hypertension: Secondary | ICD-10-CM | POA: Diagnosis not present

## 2015-04-14 LAB — TSH: TSH: 4.11 u[IU]/mL (ref 0.35–4.50)

## 2015-04-14 NOTE — Progress Notes (Signed)
Pre visit review using our clinic review tool, if applicable. No additional management support is needed unless otherwise documented below in the visit note. 

## 2015-04-14 NOTE — Progress Notes (Signed)
Subjective:    Patient ID: Sara Medina, female    DOB: Feb 19, 1930, 81 y.o.   MRN: [id number]  HPI Patient seen for Medicare wellness exam and medical follow-up. Chronic problems include history of hypothyroidism, palpitations, hypertension and mild hyperlipidemia. She has declined further statin use. She has osteoarthritis which is relatively mild. She stays quite active and does some walking for exercise. She is postmenopausal and reportedly had osteopenia on DEXA scan many years ago but no bone density scan in several years. Medications reviewed. She remains on levothyroxine and metoprolol.  She's had some recent depressed mood. She is currently in counseling and thinks that is helping. She states she tends to get depressed during the holidays. Still engaged in hobbies. Blood pressures have been stable by home readings. She states her blood pressure is consistently higher here.  Has had prior shingles vaccine. Tetanus up-to-date. Flu vaccine already given. She declines pneumonia vaccine  Past Medical History  Diagnosis Date  . Allergy   . Asthma   . Diverticulosis   . History of diverticulitis of colon   . GERD (gastroesophageal reflux disease)   . Arthritis   . Hyperlipidemia   . Hypertension   . Thyroid disease   . Mitral valve prolapse    Past Surgical History  Procedure Laterality Date  . Trigger thumb    . Abdominal hysterectomy    . Tonsillectomy    . Shoulder surgery      reports that she has never smoked. She has never used smokeless tobacco. She reports that she drinks alcohol. She reports that she does not use illicit drugs. family history includes Cancer in her mother and sister; Heart disease in her father; Ovarian cancer in an other family member; Stroke in her father. Allergies  Allergen Reactions  . Peanut-Containing Drug Products   . Penicillins    1.  Risk factors based on Past Medical , Social, and Family history reviewed and as indicated above with no  changes 2.  Limitations in physical activities None.  No recent falls. 3.  Depression/mood No active depression or anxiety issues 4.  Hearing she has bilateral hearing aids. 5.  ADLs independent in all. 6.  Cognitive function (orientation to time and place, language, writing, speech,memory) no short or long term memory issues.  Language and judgement intact. 7.  Home Safety no issues 8.  Height, weight, and visual acuity.all stable. 9.  Counseling discussed ongoing weightbearing exercise and daily calcium and vitamin D supplementation 10. Recommendation of preventive services. Recommended Prevnar 13 and patient declines. Set up repeat bone density scan 11. Labs based on risk factors TSH 12. Care Plan as above 13. Other Providers none 14. Written schedule of screening/prevention services given to patient.     Review of Systems  Constitutional: Negative for fever, activity change, appetite change, fatigue and unexpected weight change.  HENT: Negative for ear pain, hearing loss, sore throat and trouble swallowing.   Eyes: Negative for visual disturbance.  Respiratory: Negative for cough and shortness of breath.   Cardiovascular: Negative for chest pain and palpitations.  Gastrointestinal: Negative for abdominal pain, diarrhea, constipation and blood in stool.  Endocrine: Negative for polydipsia and polyuria.  Genitourinary: Negative for dysuria and hematuria.  Musculoskeletal: Negative for myalgias, back pain and arthralgias.  Skin: Negative for rash.  Neurological: Negative for dizziness, syncope and headaches.  Hematological: Negative for adenopathy.  Psychiatric/Behavioral: Negative for confusion and dysphoric mood.       Objective:   Physical  Exam  Constitutional: She is oriented to person, place, and time. She appears well-developed and well-nourished.  HENT:  Head: Normocephalic and atraumatic.  Eyes: EOM are normal. Pupils are equal, round, and reactive to light.  Neck:  Normal range of motion. Neck supple. No thyromegaly present.  Cardiovascular: Normal rate, regular rhythm and normal heart sounds.   No murmur heard. Pulmonary/Chest: Breath sounds normal. No respiratory distress. She has no wheezes. She has no rales.  Abdominal: Soft. Bowel sounds are normal. She exhibits no distension and no mass. There is no tenderness. There is no rebound and no guarding.  Musculoskeletal: Normal range of motion. She exhibits no edema.  Lymphadenopathy:    She has no cervical adenopathy.  Neurological: She is alert and oriented to person, place, and time. She displays normal reflexes. No cranial nerve deficit.  Skin: No rash noted.  Psychiatric: She has a normal mood and affect. Her behavior is normal. Judgment and thought content normal.          Assessment & Plan:   #1 Medicare wellness visit. Flu vaccine already given. Tetanus up-to-date Shingles vaccine already given.  Recommended Prevnar 13 and she declines. #2 hypothyroid- recheck TSH. #3 hypertension- stable and at goal #4 hx of reported osteopenia.  Check DEXA.

## 2015-04-14 NOTE — Patient Instructions (Addendum)
Continue with yearly flu vaccine Your tetanus will be due in 7 years. Set up bone density scan. You will not get any more shingles vaccines Continue with daily calcium and Vit d.  Health Maintenance  Topic Date Due  . DEXA SCAN  01/22/1997  . PNA vac Low Risk Adult (2 of 2 - PCV13) 04/13/2025 (Originally 03/03/2004)  . INFLUENZA VACCINE  12/29/2015  . TETANUS/TDAP  02/26/2022  . ZOSTAVAX  Completed

## 2015-04-16 ENCOUNTER — Ambulatory Visit (INDEPENDENT_AMBULATORY_CARE_PROVIDER_SITE_OTHER)
Admission: RE | Admit: 2015-04-16 | Discharge: 2015-04-16 | Disposition: A | Discharge status: Home / Self Care | Payer: Medicare Other | Source: Ambulatory Visit | Attending: Family Medicine | Admitting: Family Medicine

## 2015-04-16 DIAGNOSIS — Z780 Asymptomatic menopausal state: Secondary | ICD-10-CM

## 2015-04-20 DIAGNOSIS — F4321 Adjustment disorder with depressed mood: Secondary | ICD-10-CM | POA: Diagnosis not present

## 2015-04-26 ENCOUNTER — Encounter: Payer: Self-pay | Admitting: Family Medicine

## 2015-05-18 DIAGNOSIS — F4321 Adjustment disorder with depressed mood: Secondary | ICD-10-CM | POA: Diagnosis not present

## 2015-05-28 ENCOUNTER — Other Ambulatory Visit: Payer: Self-pay | Admitting: Family Medicine

## 2015-06-03 ENCOUNTER — Other Ambulatory Visit: Payer: Self-pay

## 2015-06-03 MED ORDER — EPINEPHRINE 0.3 MG/0.3ML IJ SOAJ: 0.3000 mg | Freq: Once | INTRAMUSCULAR | Status: DC

## 2015-06-03 NOTE — Telephone Encounter (Signed)
Rx request for epinephrine auto-inj 0.3 mg kit.   Rx sent to pharmacy with 0 rf.

## 2015-09-29 DIAGNOSIS — M9903 Segmental and somatic dysfunction of lumbar region: Secondary | ICD-10-CM | POA: Diagnosis not present

## 2015-09-29 DIAGNOSIS — M545 Low back pain: Secondary | ICD-10-CM | POA: Diagnosis not present

## 2015-09-29 DIAGNOSIS — M47816 Spondylosis without myelopathy or radiculopathy, lumbar region: Secondary | ICD-10-CM | POA: Diagnosis not present

## 2015-09-29 DIAGNOSIS — M5136 Other intervertebral disc degeneration, lumbar region: Secondary | ICD-10-CM | POA: Diagnosis not present

## 2015-10-08 ENCOUNTER — Other Ambulatory Visit: Payer: Self-pay

## 2015-10-08 DIAGNOSIS — Z1231 Encounter for screening mammogram for malignant neoplasm of breast: Secondary | ICD-10-CM

## 2015-10-13 DIAGNOSIS — M5136 Other intervertebral disc degeneration, lumbar region: Secondary | ICD-10-CM | POA: Diagnosis not present

## 2015-10-13 DIAGNOSIS — M47816 Spondylosis without myelopathy or radiculopathy, lumbar region: Secondary | ICD-10-CM | POA: Diagnosis not present

## 2015-10-13 DIAGNOSIS — M9903 Segmental and somatic dysfunction of lumbar region: Secondary | ICD-10-CM | POA: Diagnosis not present

## 2015-10-13 DIAGNOSIS — M545 Low back pain: Secondary | ICD-10-CM | POA: Diagnosis not present

## 2015-10-15 DIAGNOSIS — H43813 Vitreous degeneration, bilateral: Secondary | ICD-10-CM | POA: Diagnosis not present

## 2015-10-15 DIAGNOSIS — H04123 Dry eye syndrome of bilateral lacrimal glands: Secondary | ICD-10-CM | POA: Diagnosis not present

## 2015-10-15 DIAGNOSIS — H52203 Unspecified astigmatism, bilateral: Secondary | ICD-10-CM | POA: Diagnosis not present

## 2015-10-20 ENCOUNTER — Ambulatory Visit
Admission: RE | Admit: 2015-10-20 | Discharge: 2015-10-20 | Disposition: A | Discharge status: Home / Self Care | Payer: Medicare Other | Source: Ambulatory Visit

## 2015-10-20 DIAGNOSIS — Z1231 Encounter for screening mammogram for malignant neoplasm of breast: Secondary | ICD-10-CM

## 2015-10-20 DIAGNOSIS — M5136 Other intervertebral disc degeneration, lumbar region: Secondary | ICD-10-CM | POA: Diagnosis not present

## 2015-10-20 DIAGNOSIS — M9903 Segmental and somatic dysfunction of lumbar region: Secondary | ICD-10-CM | POA: Diagnosis not present

## 2015-10-20 DIAGNOSIS — M47816 Spondylosis without myelopathy or radiculopathy, lumbar region: Secondary | ICD-10-CM | POA: Diagnosis not present

## 2015-10-20 DIAGNOSIS — M545 Low back pain: Secondary | ICD-10-CM | POA: Diagnosis not present

## 2015-10-22 ENCOUNTER — Ambulatory Visit: Payer: Medicare Other

## 2015-10-27 DIAGNOSIS — M545 Low back pain: Secondary | ICD-10-CM | POA: Diagnosis not present

## 2015-10-27 DIAGNOSIS — M9903 Segmental and somatic dysfunction of lumbar region: Secondary | ICD-10-CM | POA: Diagnosis not present

## 2015-10-27 DIAGNOSIS — M47816 Spondylosis without myelopathy or radiculopathy, lumbar region: Secondary | ICD-10-CM | POA: Diagnosis not present

## 2015-10-27 DIAGNOSIS — M5136 Other intervertebral disc degeneration, lumbar region: Secondary | ICD-10-CM | POA: Diagnosis not present

## 2015-10-28 ENCOUNTER — Ambulatory Visit (INDEPENDENT_AMBULATORY_CARE_PROVIDER_SITE_OTHER): Payer: Medicare Other | Admitting: Family Medicine

## 2015-10-28 VITALS — BP 150/80 | HR 83 | Temp 97.5°F | Ht 64.0 in | Wt 122.0 lb

## 2015-10-28 DIAGNOSIS — K529 Noninfective gastroenteritis and colitis, unspecified: Secondary | ICD-10-CM

## 2015-10-28 DIAGNOSIS — R197 Diarrhea, unspecified: Secondary | ICD-10-CM | POA: Diagnosis not present

## 2015-10-28 DIAGNOSIS — R112 Nausea with vomiting, unspecified: Secondary | ICD-10-CM

## 2015-10-28 NOTE — Progress Notes (Signed)
Pre visit review using our clinic review tool, if applicable. No additional management support is needed unless otherwise documented below in the visit note. 

## 2015-10-28 NOTE — Progress Notes (Signed)
Subjective:    Patient ID: Sara Medina, female    DOB: Oct 14, 1929, 82 y.o.   MRN: [id number]  HPI Patient seen with 2 week history of reported diarrhea and 2 separate episodes of vomiting She states that for least 20 years she's had frequent loose stools -most days up to 3-4 per day. She's had prior colonoscopy. No clear etiology. No clear dietary triggers. No history of lactose intolerance. No history of gluten sensitivity She states difference over the past 2 weeks is that she is now having 3-5 watery stools per day. No bloody stools. No fever. No chills. Appetite is good and she has not had any weight loss. In fact, her weight is up 2 pounds from last fall.  Frequently has a bloated feeling across her abdomen diffusely. No history of small bowel obstruction. 2 separate episodes of vomiting over the past week most recently yesterday. She states that she vomited some chicken that she had eaten 24 hours prior. No history of known gastroparesis.  No recent travels. No recent antibiotics.  Past Medical History  Diagnosis Date  . Allergy   . Asthma   . Diverticulosis   . History of diverticulitis of colon   . GERD (gastroesophageal reflux disease)   . Arthritis   . Hyperlipidemia   . Hypertension   . Thyroid disease   . Mitral valve prolapse    Past Surgical History  Procedure Laterality Date  . Trigger thumb    . Abdominal hysterectomy    . Tonsillectomy    . Shoulder surgery      reports that she has never smoked. She has never used smokeless tobacco. She reports that she drinks alcohol. She reports that she does not use illicit drugs. family history includes Cancer in her mother and sister; Heart disease in her father; Stroke in her father. Allergies  Allergen Reactions  . Peanut-Containing Drug Products   . Penicillins       Review of Systems  Constitutional: Negative for fever, chills, appetite change, fatigue and unexpected weight change.  Respiratory: Negative  for shortness of breath.   Cardiovascular: Negative for chest pain.  Gastrointestinal: Positive for nausea and diarrhea. Negative for constipation.  Genitourinary: Negative for dysuria.  Neurological: Negative for dizziness.       Objective:   Physical Exam  Constitutional: She appears well-developed and well-nourished. No distress.  HENT:  Mouth/Throat: Oropharynx is clear and moist.  Neck: Neck supple. No thyromegaly present.  Cardiovascular: Normal rate and regular rhythm.   Pulmonary/Chest: Effort normal and breath sounds normal. No respiratory distress. She has no wheezes. She has no rales.  Abdominal: Soft. Bowel sounds are normal. She exhibits no distension and no mass. There is no tenderness. There is no rebound and no guarding.  Musculoskeletal: She exhibits no edema.          Assessment & Plan:  Patient presents with worsening chronic diarrhea and 2 separate episodes of vomiting over the past week. She does not appear to be dehydrated. She has benign abdominal exam at this time. She is not have any known history of food intolerance such as lactose intolerance or gluten sensitivity. No recent antibiotics. She has no evidence for bowel obstruction or acute abdomen at this time. Check labs with basic metabolic panel, CBC, hepatic panel, GI pathogen panel. We discussed appropriate diet choices for diarrhea. If she has recurrent episodes of vomiting consider further evaluation to rule out gastroparesis. May consider gastric emptying study. Reassess in one  week and sooner prn.  Eulas Post MD [facility]

## 2015-10-28 NOTE — Patient Instructions (Signed)

## 2015-10-29 LAB — CBC WITH DIFFERENTIAL/PLATELET
BASOS PCT: 1.9 % (ref 0.0–3.0)
Basophils Absolute: 0.1 10*3/uL (ref 0.0–0.1)
EOS ABS: 0.1 10*3/uL (ref 0.0–0.7)
EOS PCT: 1.2 % (ref 0.0–5.0)
HCT: 39 % (ref 36.0–46.0)
HEMOGLOBIN: 13.2 g/dL (ref 12.0–15.0)
LYMPHS ABS: 1.9 10*3/uL (ref 0.7–4.0)
Lymphocytes Relative: 30.6 % (ref 12.0–46.0)
MCHC: 34 g/dL (ref 30.0–36.0)
MCV: 95.4 fl (ref 78.0–100.0)
MONO ABS: 0.4 10*3/uL (ref 0.1–1.0)
Monocytes Relative: 5.6 % (ref 3.0–12.0)
NEUTROS PCT: 60.7 % (ref 43.0–77.0)
Neutro Abs: 3.8 10*3/uL (ref 1.4–7.7)
PLATELETS: 324 10*3/uL (ref 150.0–400.0)
RBC: 4.09 Mil/uL (ref 3.87–5.11)
RDW: 12.8 % (ref 11.5–15.5)
WBC: 6.3 10*3/uL (ref 4.0–10.5)

## 2015-10-29 LAB — BASIC METABOLIC PANEL
BUN: 20 mg/dL (ref 6–23)
CHLORIDE: 106 meq/L (ref 96–112)
CO2: 26 mEq/L (ref 19–32)
Calcium: 9.6 mg/dL (ref 8.4–10.5)
Creatinine, Ser: 0.91 mg/dL (ref 0.40–1.20)
GFR: 62.63 mL/min (ref >60.00)
GLUCOSE: 98 mg/dL (ref 70–99)
POTASSIUM: 4 meq/L (ref 3.5–5.1)
SODIUM: 138 meq/L (ref 135–145)

## 2015-10-29 LAB — HEPATIC FUNCTION PANEL
ALK PHOS: 38 U/L — AB (ref 39–117)
ALT: 19 U/L (ref 0–35)
AST: 17 U/L (ref 0–37)
Albumin: 4.2 g/dL (ref 3.5–5.2)
BILIRUBIN TOTAL: 0.2 mg/dL (ref 0.2–1.2)
Bilirubin, Direct: 0 mg/dL (ref 0.0–0.3)
Total Protein: 7 g/dL (ref 6.0–8.3)

## 2015-10-30 ENCOUNTER — Encounter: Payer: Self-pay | Admitting: Family Medicine

## 2015-11-03 LAB — GASTROINTESTINAL PATHOGEN PANEL PCR
C. DIFFICILE TOX A/B, PCR: NOT DETECTED
CAMPYLOBACTER, PCR: NOT DETECTED
CRYPTOSPORIDIUM, PCR: NOT DETECTED
E coli (ETEC) LT/ST PCR: NOT DETECTED
E coli (STEC) stx1/stx2, PCR: NOT DETECTED
E coli 0157, PCR: NOT DETECTED
Giardia lamblia, PCR: NOT DETECTED
Norovirus, PCR: NOT DETECTED
ROTAVIRUS, PCR: NOT DETECTED
Salmonella, PCR: NOT DETECTED
Shigella, PCR: NOT DETECTED

## 2015-11-05 ENCOUNTER — Ambulatory Visit (INDEPENDENT_AMBULATORY_CARE_PROVIDER_SITE_OTHER): Payer: Medicare Other | Admitting: Family Medicine

## 2015-11-05 VITALS — BP 160/80 | HR 64 | Temp 97.7°F | Ht 64.0 in | Wt 123.9 lb

## 2015-11-05 DIAGNOSIS — K529 Noninfective gastroenteritis and colitis, unspecified: Secondary | ICD-10-CM

## 2015-11-05 MED ORDER — CHOLESTYRAMINE 4 GM/DOSE PO POWD: ORAL | Status: DC

## 2015-11-05 NOTE — Progress Notes (Signed)
Pre visit review using our clinic review tool, if applicable. No additional management support is needed unless otherwise documented below in the visit note. 

## 2015-11-05 NOTE — Patient Instructions (Signed)
Chronic Diarrhea Diarrhea is frequent loose and watery bowel movements. It can cause you to feel weak and dehydrated. Dehydration can cause you to become tired and thirsty and to have a dry mouth, decreased urination, and dark yellow urine. Diarrhea is a sign of another problem, most often an infection that will not last long. In most cases, diarrhea lasts 2-3 days. Diarrhea that lasts longer than 4 weeks is called long-lasting (chronic) diarrhea. It is important to treat your diarrhea as directed by your health care provider to lessen or prevent future episodes of diarrhea.  CAUSES  There are many causes of chronic diarrhea. The following are some possible causes:   Gastrointestinal infections caused by viruses, bacteria, or parasites.   Food poisoning or food allergies.   Certain medicines, such as antibiotics, chemotherapy, and laxatives.   Artificial sweeteners and fructose.   Digestive disorders, such as celiac disease and inflammatory bowel diseases.   Irritable bowel syndrome.  Some disorders of the pancreas.  Disorders of the thyroid.  Reduced blood flow to the intestines.  Cancer. Sometimes the cause of chronic diarrhea is unknown. RISK FACTORS  Having a severely weakened immune system, such as from HIV or AIDS.   Taking certain types of cancer-fighting drugs (such as with chemotherapy) or other medicines.   Having had a recent organ transplant.   Having a portion of the stomach or small bowel removed.   Traveling to countries where food and water supplies are often contaminated.  SYMPTOMS  In addition to frequent, loose stools, diarrhea may cause:   Cramping.   Abdominal pain.   Nausea.   Fever.  Fatigue.  Urgent need to use the bathroom.  Loss of bowel control. DIAGNOSIS  Your health care provider must take a careful history and perform a physical exam. Tests given are based on your symptoms and history. Tests may include:   Blood or  stool tests. Three or more stool samples may be examined. Stool cultures may be used to test for bacteria or parasites.   X-rays.   A procedure in which a thin tube is inserted into the mouth or rectum (endoscopy). This allows the health care provider to look inside the intestine.  TREATMENT   Treatment is aimed at correcting the cause of the diarrhea when possible.  Diarrhea caused by an infection can often be treated with antibiotic medicines.  Diarrhea not caused by an infection may require you to take long-term medicine or have surgery. Specific treatment should be discussed with your health care provider.  If the cause cannot be determined, treatment aims to relieve symptoms and prevent dehydration. Serious health problems can occur if you do not maintain proper fluid levels. Treatment may include:  Taking an oral rehydration solution (ORS).  Not drinking beverages that contain caffeine (such as tea, coffee, and soft drinks).  Not drinking alcohol.  Maintaining well-balanced nutrition to help you recover faster. HOME CARE INSTRUCTIONS   Drink enough fluids to keep urine clear or pale yellow. Drink 1 cup (8 oz) of fluid for each diarrhea episode. Avoid fluids that contain simple sugars, fruit juices, whole milk products, and sodas. Hydrate with an ORS. You may purchase the ORS or prepare it at home by mixing the following ingredients together:   - tsp (1.7-3  mL) table salt.   tsp (3  mL) baking soda.   tsp (1.7 mL) salt substitute containing potassium chloride.  1 tbsp (20 mL) sugar.  4.2 c (1 L) of water.     Certain foods and beverages may increase the speed at which food moves through the gastrointestinal (GI) tract. These foods and beverages should be avoided. They include:  Caffeinated and alcoholic beverages.  High-fiber foods, such as raw fruits and vegetables, nuts, seeds, and whole grain breads and cereals.  Foods and beverages sweetened with sugar  alcohols, such as xylitol, sorbitol, and mannitol.   Some foods may be well tolerated and may help thicken stool. These include:  Starchy foods, such as rice, toast, pasta, low-sugar cereal, oatmeal, grits, baked potatoes, crackers, and bagels.  Bananas.  Applesauce.  Add probiotic-rich foods to help increase healthy bacteria in the GI tract. These include yogurt and fermented milk products.  Wash your hands well after each diarrhea episode.  Only take over-the-counter or prescription medicines as directed by your health care provider.  Take a warm bath to relieve any burning or pain from frequent diarrhea episodes. SEEK MEDICAL CARE IF:   You are not urinating as often.  Your urine is a dark color.  You become very tired or dizzy.  You have severe pain in the abdomen or rectum.  Your have blood or pus in your stools.  Your stools look black and tarry. SEEK IMMEDIATE MEDICAL CARE IF:   You are unable to keep fluids down.  You have persistent vomiting.  You have blood in your stool.  Your stools are black and tarry.  You do not urinate in 6-8 hours, or there is only a small amount of very dark urine.  You have abdominal pain that increases or localizes.  You have weakness, dizziness, confusion, or lightheadedness.  You have a severe headache.  Your diarrhea gets worse or does not get better.  You have a fever or persistent symptoms for more than 2-3 days.  You have a fever and your symptoms suddenly get worse. MAKE SURE YOU:   Understand these instructions.  Will watch your condition.  Will get help right away if you are not doing well or get worse.   This information is not intended to replace advice given to you by your health care provider. Make sure you discuss any questions you have with your health care provider.   Document Released: 08/06/2003 Document Revised: 05/21/2013 Document Reviewed: 11/08/2012 Elsevier Interactive Patient Education 2016  [employer]  

## 2015-11-05 NOTE — Progress Notes (Signed)
Subjective:    Patient ID: Sara Medina, female    DOB: 1930/03/23, 82 y.o.   MRN: [id number]  HPI  Patient here for follow-up regarding chronic diarrhea symptoms.  Refer to recent note:  " Patient seen with 2 week history of reported diarrhea and 2 separate episodes of vomiting She states that for least 20 years she's had frequent loose stools -most days up to 3-4 per day. She's had prior colonoscopy. No clear etiology. No clear dietary triggers. No history of lactose intolerance. No history of gluten sensitivity She states difference over the past 2 weeks is that she is now having 3-5 watery stools per day. No bloody stools. No fever. No chills. Appetite is good and she has not had any weight loss. In fact, her weight is up 2 pounds from last fall.  Frequently has a bloated feeling across her abdomen diffusely. No history of small bowel obstruction. 2 separate episodes of vomiting over the past week most recently yesterday. She states that she vomited some chicken that she had eaten 24 hours prior. No history of known gastroparesis.  No recent travels. No recent antibiotics."  We obtain further labs including comprehensive metabolic panel, CBC , and GI pathogen panel and these were all normal. She had recent TSH back in November which was normal. She's not had any further nausea or vomiting. Generally having 2-4 watery brownish colored stools per day. Never had blood. No history of lactose intolerance. No history of gluten intolerance. Taken occasional Imodium which helps. No history of cholecystectomy. She had colonoscopy around age 71. No recent appetite or weight changes. Weight is actually up couple pounds from last visit. Took some Metamucil which did not help.  Past Medical History  Diagnosis Date  . Allergy   . Asthma   . Diverticulosis   . History of diverticulitis of colon   . GERD (gastroesophageal reflux disease)   . Arthritis   . Hyperlipidemia   . Hypertension   .  Thyroid disease   . Mitral valve prolapse    Past Surgical History  Procedure Laterality Date  . Trigger thumb    . Abdominal hysterectomy    . Tonsillectomy    . Shoulder surgery      reports that she has never smoked. She has never used smokeless tobacco. She reports that she drinks alcohol. She reports that she does not use illicit drugs. family history includes Cancer in her mother and sister; Heart disease in her father; Stroke in her father. Allergies  Allergen Reactions  . Peanut-Containing Drug Products   . Penicillins      Review of Systems  Constitutional: Negative for fever, chills, appetite change, fatigue and unexpected weight change.  Respiratory: Negative for shortness of breath.   Cardiovascular: Negative for chest pain.  Gastrointestinal: Positive for diarrhea. Negative for nausea, vomiting, abdominal pain and blood in stool.  Genitourinary: Negative for dysuria.  Neurological: Negative for dizziness.  Hematological: Negative for adenopathy.       Objective:   Physical Exam  Constitutional: She appears well-developed and well-nourished.  HENT:  Mouth/Throat: Oropharynx is clear and moist.  Neck: Neck supple.  Cardiovascular: Normal rate and regular rhythm.   Pulmonary/Chest: Effort normal and breath sounds normal. No respiratory distress. She has no wheezes. She has no rales.  Abdominal: Soft. Bowel sounds are normal. She exhibits no distension and no mass. There is no tenderness. There is no rebound and no guarding.  Musculoskeletal: She exhibits no edema.  Assessment & Plan:  Chronic diarrhea. Recent lab work unremarkable. She does not have any red flags such as appetite change, weight loss, bloody stools. Her symptoms have been very chronic. Only recent variance was the fact she had 2 episodes of vomiting before last visit but none whatsoever since then. Previous colonoscopy was unrevealing with no history of known colitis. We've recommended  trial of Questran 4 g once daily. She was instructed not to take this with other medications as this could decrease absorption. She will let us know in 1-2 weeks if not seeing improvements.  Eulas Post MD [facility] at [facility]

## 2015-11-10 DIAGNOSIS — M545 Low back pain: Secondary | ICD-10-CM | POA: Diagnosis not present

## 2015-11-10 DIAGNOSIS — M9903 Segmental and somatic dysfunction of lumbar region: Secondary | ICD-10-CM | POA: Diagnosis not present

## 2015-11-10 DIAGNOSIS — M47816 Spondylosis without myelopathy or radiculopathy, lumbar region: Secondary | ICD-10-CM | POA: Diagnosis not present

## 2015-11-10 DIAGNOSIS — M5136 Other intervertebral disc degeneration, lumbar region: Secondary | ICD-10-CM | POA: Diagnosis not present

## 2015-11-17 ENCOUNTER — Encounter: Payer: Self-pay | Admitting: Family Medicine

## 2015-11-17 DIAGNOSIS — K529 Noninfective gastroenteritis and colitis, unspecified: Secondary | ICD-10-CM

## 2015-11-17 NOTE — Telephone Encounter (Signed)
Pt recently see on 11/05/2015 for SX. Any other suggestions.

## 2015-11-18 NOTE — Telephone Encounter (Signed)
Order entered for referral.  

## 2015-11-19 ENCOUNTER — Encounter: Payer: Self-pay | Admitting: Gastroenterology

## 2015-11-24 DIAGNOSIS — M9903 Segmental and somatic dysfunction of lumbar region: Secondary | ICD-10-CM | POA: Diagnosis not present

## 2015-11-24 DIAGNOSIS — M5136 Other intervertebral disc degeneration, lumbar region: Secondary | ICD-10-CM | POA: Diagnosis not present

## 2015-11-24 DIAGNOSIS — M545 Low back pain: Secondary | ICD-10-CM | POA: Diagnosis not present

## 2015-11-24 DIAGNOSIS — M47816 Spondylosis without myelopathy or radiculopathy, lumbar region: Secondary | ICD-10-CM | POA: Diagnosis not present

## 2015-11-26 DIAGNOSIS — M47816 Spondylosis without myelopathy or radiculopathy, lumbar region: Secondary | ICD-10-CM | POA: Diagnosis not present

## 2015-11-26 DIAGNOSIS — M5136 Other intervertebral disc degeneration, lumbar region: Secondary | ICD-10-CM | POA: Diagnosis not present

## 2015-11-26 DIAGNOSIS — M545 Low back pain: Secondary | ICD-10-CM | POA: Diagnosis not present

## 2015-11-26 DIAGNOSIS — M9903 Segmental and somatic dysfunction of lumbar region: Secondary | ICD-10-CM | POA: Diagnosis not present

## 2015-12-08 DIAGNOSIS — M545 Low back pain: Secondary | ICD-10-CM | POA: Diagnosis not present

## 2015-12-08 DIAGNOSIS — M5136 Other intervertebral disc degeneration, lumbar region: Secondary | ICD-10-CM | POA: Diagnosis not present

## 2015-12-08 DIAGNOSIS — M47816 Spondylosis without myelopathy or radiculopathy, lumbar region: Secondary | ICD-10-CM | POA: Diagnosis not present

## 2015-12-08 DIAGNOSIS — M9903 Segmental and somatic dysfunction of lumbar region: Secondary | ICD-10-CM | POA: Diagnosis not present

## 2015-12-09 ENCOUNTER — Other Ambulatory Visit: Payer: Self-pay | Admitting: Family Medicine

## 2015-12-09 MED ORDER — LEVOTHYROXINE SODIUM 25 MCG PO TABS: ORAL_TABLET | ORAL | Status: DC

## 2015-12-09 NOTE — Telephone Encounter (Signed)
I did send script e-scribe.  

## 2015-12-09 NOTE — Telephone Encounter (Signed)
Refill request for Levothyroxine 25 mcg and a 90 day supply to [facility].

## 2015-12-22 DIAGNOSIS — M545 Low back pain: Secondary | ICD-10-CM | POA: Diagnosis not present

## 2015-12-22 DIAGNOSIS — M5136 Other intervertebral disc degeneration, lumbar region: Secondary | ICD-10-CM | POA: Diagnosis not present

## 2015-12-22 DIAGNOSIS — M9903 Segmental and somatic dysfunction of lumbar region: Secondary | ICD-10-CM | POA: Diagnosis not present

## 2015-12-22 DIAGNOSIS — M47816 Spondylosis without myelopathy or radiculopathy, lumbar region: Secondary | ICD-10-CM | POA: Diagnosis not present

## 2016-01-12 DIAGNOSIS — M9903 Segmental and somatic dysfunction of lumbar region: Secondary | ICD-10-CM | POA: Diagnosis not present

## 2016-01-12 DIAGNOSIS — M545 Low back pain: Secondary | ICD-10-CM | POA: Diagnosis not present

## 2016-01-12 DIAGNOSIS — M47816 Spondylosis without myelopathy or radiculopathy, lumbar region: Secondary | ICD-10-CM | POA: Diagnosis not present

## 2016-01-12 DIAGNOSIS — M5136 Other intervertebral disc degeneration, lumbar region: Secondary | ICD-10-CM | POA: Diagnosis not present

## 2016-01-26 ENCOUNTER — Ambulatory Visit: Payer: Medicare Other | Admitting: Gastroenterology

## 2016-01-26 DIAGNOSIS — M545 Low back pain: Secondary | ICD-10-CM | POA: Diagnosis not present

## 2016-01-26 DIAGNOSIS — M9903 Segmental and somatic dysfunction of lumbar region: Secondary | ICD-10-CM | POA: Diagnosis not present

## 2016-01-26 DIAGNOSIS — M5136 Other intervertebral disc degeneration, lumbar region: Secondary | ICD-10-CM | POA: Diagnosis not present

## 2016-01-26 DIAGNOSIS — M47816 Spondylosis without myelopathy or radiculopathy, lumbar region: Secondary | ICD-10-CM | POA: Diagnosis not present

## 2016-01-29 ENCOUNTER — Ambulatory Visit: Payer: Medicare Other | Admitting: Gastroenterology

## 2016-02-04 ENCOUNTER — Encounter: Payer: Self-pay | Admitting: Physician Assistant

## 2016-02-04 ENCOUNTER — Ambulatory Visit: Payer: Medicare Other | Admitting: Gastroenterology

## 2016-02-04 ENCOUNTER — Ambulatory Visit (INDEPENDENT_AMBULATORY_CARE_PROVIDER_SITE_OTHER): Payer: Medicare Other | Admitting: Physician Assistant

## 2016-02-04 VITALS — BP 110/70 | HR 60 | Ht 64.0 in | Wt 124.2 lb

## 2016-02-04 DIAGNOSIS — R1013 Epigastric pain: Secondary | ICD-10-CM | POA: Diagnosis not present

## 2016-02-04 DIAGNOSIS — R12 Heartburn: Secondary | ICD-10-CM

## 2016-02-04 DIAGNOSIS — R197 Diarrhea, unspecified: Secondary | ICD-10-CM

## 2016-02-04 NOTE — Progress Notes (Signed)
Chief Complaint: Dyspepsia and Diarrhea  HPI:  Sara Medina is an 82 year old Caucasian female who presents to clinic today, accompanied by her daughter, who was referred to me by Eulas Post, MD for a complaint of chronic diarrhea, dyspepsia and reflux .    Today, the patient explains that she has dealt with diarrhea off and on for 20 years, she did recently see her primary care provider regarding this around 10 weeks ago and he mentioned a possible diagnosis of gastroparesis, she also battled with episodes of nausea and vomiting throughout this time as well as bloating and some reflux. Specifically the patient had noted an incident where she vomited up a piece of chicken she had eaten 24 hours previously. After seeing her primary care physician she went online to research gastroparesis and found a diet which was recommended. She has started this and has had almost complete resolution of her symptoms in the past 10 weeks.   Patient explains that she did continue with diarrhea throughout this time which for her is typically multiple loose stools within an 1-2 hour time in the morning and possibly one other time during the day, but a week ago restarted her Metamucil and has had significant improvement in this as well, noting that her stools even have "some form to them". The patient is actually fairly happy with her symptoms at the moment and is very willing to continue on a gastroparesis diet.   She is currently on 20 mg of famotidine daily and has had no further heartburn or reflux problems. The patient explains that if she does eat too much or meals too close together she will develop an epigastric "full feeling" and bloating, but this is only if she "strays from her diet".  Patient denies fever, chills, blood in her stool, melena, weight loss, fatigue, anorexia, recent nausea and vomiting, dysphagia, abdominal pain or symptoms that awaken her at night.     Past Medical History:  Diagnosis  Date  . Allergy   . Arthritis   . Asthma   . Colon polyps   . Depression   . Diverticulosis   . GERD (gastroesophageal reflux disease)   . History of diverticulitis of colon   . Hyperlipidemia   . Hypertension   . Mitral valve prolapse   . Thyroid disease     Past Surgical History:  Procedure Laterality Date  . ABDOMINAL HYSTERECTOMY    . SHOULDER SURGERY Right   . TONSILLECTOMY    . trigger thumb Right     Current Outpatient Prescriptions  Medication Sig Dispense Refill  . albuterol (PROAIR HFA) 108 (90 BASE) MCG/ACT inhaler INHALE 2 PUFFS INTO THE LUNGS EVERY 6 (SIX) HOURS AS NEEDED FOR WHEEZING OR SHORTNESS OF BREATH. 8.5 g 5  . aspirin 81 MG tablet Take 81 mg by mouth daily.      . Cholecalciferol (VITAMIN D-3 PO) Take 1,000 Int'l Units by mouth daily.    Marland Kitchen EPINEPHrine 0.3 mg/0.3 mL IJ SOAJ injection Inject 0.3 mLs (0.3 mg total) into the muscle once. 2 Device 0  . estradiol (ESTRACE) 0.5 MG tablet TAKE 1 TABLET (0.5 MG TOTAL) BY MOUTH DAILY. 90 tablet 3  . famotidine (PEPCID) 20 MG tablet Take 20 mg by mouth daily.    Marland Kitchen levothyroxine (SYNTHROID, LEVOTHROID) 25 MCG tablet TAKE 1 TABLET (25 MCG TOTAL) BY MOUTH DAILY BEFORE BREAKFAST. 90 tablet 0  . metoprolol tartrate (LOPRESSOR) 25 MG tablet 50 MG IN THE MORNING AND 25 IN  THE EVENING 270 tablet 2  . St Johns Wort 300 MG TABS Take 1 tablet by mouth 3 (three) times daily.     . vitamin B-12 (CYANOCOBALAMIN) 500 MCG tablet Take 1,000 mcg by mouth 2 (two) times daily.      No current facility-administered medications for this visit.     Allergies as of 02/04/2016 - Review Complete 02/04/2016  Allergen Reaction Noted  . Peanut-containing drug products  06/21/2011  . Penicillins  05/04/2010    Family History  Problem Relation Age of Onset  . Ovarian cancer Mother   . Heart disease Father   . Stroke Father   . Cancer Sister     brain  . Colon cancer Neg Hx     Social History   Social History  . Marital status:  Widowed    Spouse name: N/A  . Number of children: 3  . Years of education: N/A   Occupational History  . retired Retired   Social History Main Topics  . Smoking status: Never Smoker  . Smokeless tobacco: Never Used  . Alcohol use 0.0 oz/week     Comment: Couple glasses of wine per week  . Drug use: No  . Sexual activity: Not Currently   Other Topics Concern  . Not on file   Social History Narrative  . No narrative on file    Review of Systems:    Constitutional: No weight loss, fever, chills, weakness or fatigue HEENT: Eyes: No Change in vision               Ears, Nose, Throat:  No change in hearing or congestion Skin: No rash or itching Cardiovascular: No chest pain, chest pressure or palpitations     Respiratory: No SOB or cough Gastrointestinal: See HPI and otherwise negative Genitourinary: No dysuria or change in urinary frequency Neurological: No headache, dizziness or syncope Musculoskeletal: No new muscle or joint pain Hematologic: No anemia, bleeding or bruising Psychiatric: No history of depression or anxiety   Physical Exam:  Vital signs: BP 110/70   Pulse 60   Ht 5\' 4"  (1.626 m)   Wt 124 lb 3.2 oz (56.3 kg)   BMI 21.32 kg/m   General:   Pleasant Caucasian female appears to be in NAD, Well developed, Well nourished, alert and cooperative Head:  Normocephalic and atraumatic. Eyes:   PEERL, EOMI. No icterus. Conjunctiva pink. Ears:  Normal auditory acuity. Neck:  Supple Throat: Oral cavity and pharynx without inflammation, swelling or lesion.  Lungs: Respirations even and unlabored. Lungs clear to auscultation bilaterally.   No wheezes, crackles, or rhonchi.  Heart: Normal S1, S2. No MRG. Regular rate and rhythm. No peripheral edema, cyanosis or pallor.  Abdomen:  Soft, nondistended, nontender. No rebound or guarding. Normal bowel sounds. No appreciable masses or hepatomegaly. Rectal:  Not performed.  Msk:  Symmetrical without gross deformities.    Extremities:  Without edema, no deformity or joint abnormality. Normal ROM Neurologic:  Alert and  oriented x4;  grossly normal neurologically. Skin:   Dry and intact without significant lesions or rashes. Psychiatric: Oriented to person, place and time. Demonstrates good judgement and reason without abnormal affect or behaviors.  RELEVANT LABS AND IMAGING: CBC    Component Value Date/Time   WBC 6.3 10/28/2015 1558   RBC 4.09 10/28/2015 1558   HGB 13.2 10/28/2015 1558   HCT 39.0 10/28/2015 1558   PLT 324.0 10/28/2015 1558   MCV 95.4 10/28/2015 1558   MCH 32.1 05/13/2013  1423   MCHC 34.0 10/28/2015 1558   RDW 12.8 10/28/2015 1558   LYMPHSABS 1.9 10/28/2015 1558   MONOABS 0.4 10/28/2015 1558   EOSABS 0.1 10/28/2015 1558   BASOSABS 0.1 10/28/2015 1558    CMP     Component Value Date/Time   NA 138 10/28/2015 1558   K 4.0 10/28/2015 1558   CL 106 10/28/2015 1558   CO2 26 10/28/2015 1558   GLUCOSE 98 10/28/2015 1558   BUN 20 10/28/2015 1558   CREATININE 0.91 10/28/2015 1558   CALCIUM 9.6 10/28/2015 1558   PROT 7.0 10/28/2015 1558   ALBUMIN 4.2 10/28/2015 1558   AST 17 10/28/2015 1558   ALT 19 10/28/2015 1558   ALKPHOS 38 (L) 10/28/2015 1558   BILITOT 0.2 10/28/2015 1558   GFRNONAA 61 (L) 05/13/2013 1423   GFRAA 70 (L) 05/13/2013 1423    Assessment: 1. Dyspepsia: Patient reports intermittent episodes of nausea and vomiting over the past few years, recently posed with a possible diagnosis of gastroparesis by her PCP, started gastroparesis diet over past 10 wks and has had no further symptoms since then as long as she "abides by the diet", no history of diabetes; consider gastroparesis versus gastritis versus PUD versus H. pylori versus outlet obstruction versus other 2. Heartburn: Controlled currently on gastroparesis diet and famotidine 20 mg daily 3. Diarrhea: Chronic and intermittent over the past 20 years, initially with some abdominal cramping "years ago", no further  cramping at this time, some improvement in form and decrease in frequency over the past week with re-addition of Metamucil, stool studies in May of this year were negative, previous workups including colonoscopy 10 years ago [place] negative; Consider likely IBS vs microsocpic colitis vs other. Discussed possible colonoscopy but pt declines at this time.  Plan: 1. At this time due to length of patient's symptoms in the past and history of dyspepsia as well as heartburn and diarrhea would recommend an EGD for further evaluation as this has never been done. Discussed risks, benefits, limitations and alternatives this procedure and the patient agrees to proceed. This was scheduled with Dr. Silverio Decamp as the patient is new to our clinic and requested a female. 2. If the above study is normal/negative, would recommend a gastric emptying study for firm diagnosis of gastroparesis. 3. Provided the patient with further information regarding gastroparesis and a diet to abide by. Patient would be interested in following with the nutritionist in the future to ensure that she is getting all of the nutrients she needs due to this restricted diet, if she does have a diagnosis of gastroparesis 4. Patient to continue on famotidine 20 mg daily 5. Patient to follow in clinic per Dr. Woodward Ku recommendations in the future.  Ellouise Newer, PA-C [facility] 02/04/2016, 2:53 PM  Cc: Eulas Post, MD

## 2016-02-04 NOTE — Progress Notes (Signed)
Reviewed and agree with documentation and assessment and plan. K. Veena Nandigam , MD   

## 2016-02-04 NOTE — Patient Instructions (Signed)
You have been scheduled for an endoscopy. Please follow written instructions given to you at your visit today. If you use inhalers (even only as needed), please bring them with you on the day of your procedure. Your physician has requested that you go to www.startemmi.com and enter the access code given to you at your visit today. This web site gives a general overview about your procedure. However, you should still follow specific instructions given to you by our office regarding your preparation for the procedure.  We have given you a handout on a gastroparesis diet and some Align coupons.

## 2016-02-05 ENCOUNTER — Encounter: Payer: Self-pay | Admitting: Gastroenterology

## 2016-02-05 ENCOUNTER — Ambulatory Visit (AMBULATORY_SURGERY_CENTER): Payer: Medicare Other | Admitting: Gastroenterology

## 2016-02-05 VITALS — BP 135/57 | HR 60 | Temp 97.3°F | Resp 17 | Ht 64.0 in | Wt 124.0 lb

## 2016-02-05 DIAGNOSIS — J45909 Unspecified asthma, uncomplicated: Secondary | ICD-10-CM | POA: Diagnosis not present

## 2016-02-05 DIAGNOSIS — K319 Disease of stomach and duodenum, unspecified: Secondary | ICD-10-CM | POA: Diagnosis not present

## 2016-02-05 DIAGNOSIS — I341 Nonrheumatic mitral (valve) prolapse: Secondary | ICD-10-CM | POA: Diagnosis not present

## 2016-02-05 DIAGNOSIS — K30 Functional dyspepsia: Secondary | ICD-10-CM | POA: Diagnosis not present

## 2016-02-05 DIAGNOSIS — E039 Hypothyroidism, unspecified: Secondary | ICD-10-CM | POA: Diagnosis not present

## 2016-02-05 DIAGNOSIS — I10 Essential (primary) hypertension: Secondary | ICD-10-CM | POA: Diagnosis not present

## 2016-02-05 DIAGNOSIS — R197 Diarrhea, unspecified: Secondary | ICD-10-CM | POA: Diagnosis not present

## 2016-02-05 DIAGNOSIS — R1013 Epigastric pain: Secondary | ICD-10-CM

## 2016-02-05 MED ORDER — SODIUM CHLORIDE 0.9 % IV SOLN: 500.0000 mL | INTRAVENOUS | Status: DC

## 2016-02-05 NOTE — Patient Instructions (Signed)
YOU HAD AN ENDOSCOPIC PROCEDURE TODAY AT [facility]:   Refer to the procedure report that was given to you for any specific questions about what was found during the examination.  If the procedure report does not answer your questions, please call your gastroenterologist to clarify.  If you requested that your care partner not be given the details of your procedure findings, then the procedure report has been included in a sealed envelope for you to review at your convenience later.  YOU SHOULD EXPECT: Some feelings of bloating in the abdomen. Passage of more gas than usual.  Walking can help get rid of the air that was put into your GI tract during the procedure and reduce the bloating. If you had a lower endoscopy (such as a colonoscopy or flexible sigmoidoscopy) you may notice spotting of blood in your stool or on the toilet paper. If you underwent a bowel prep for your procedure, you may not have a normal bowel movement for a few days.  Please Note:  You might notice some irritation and congestion in your nose or some drainage.  This is from the oxygen used during your procedure.  There is no need for concern and it should clear up in a day or so.  SYMPTOMS TO REPORT IMMEDIATELY:   Following upper endoscopy (EGD)  Vomiting of blood or coffee ground material  New chest pain or pain under the shoulder blades  Painful or persistently difficult swallowing  New shortness of breath  Fever of 100F or higher  Black, tarry-looking stools  For urgent or emergent issues, a gastroenterologist can be reached at any hour by calling [phone].   DIET:  We do recommend a small meal at first, but then you may proceed to your regular diet.  Drink plenty of fluids but you should avoid alcoholic beverages for 24 hours.  ACTIVITY:  You should plan to take it easy for the rest of today and you should NOT DRIVE or use heavy machinery until tomorrow (because of the sedation medicines used  during the test).    FOLLOW UP: Our staff will call the number listed on your records the next business day following your procedure to check on you and address any questions or concerns that you may have regarding the information given to you following your procedure. If we do not reach you, we will leave a message.  However, if you are feeling well and you are not experiencing any problems, there is no need to return our call.  We will assume that you have returned to your regular daily activities without incident.  If any biopsies were taken you will be contacted by phone or by letter within the next 1-3 weeks.  Please call us at [phone] if you have not heard about the biopsies in 3 weeks.    SIGNATURES/CONFIDENTIALITY: You and/or your care partner have signed paperwork which will be entered into your electronic medical record.  These signatures attest to the fact that that the information above on your After Visit Summary has been reviewed and is understood.  Full responsibility of the confidentiality of this discharge information lies with you and/or your care-partner.  Gastritis-handout given  No ibuprofen, motrin, naproxen, or NSAIDs.  Return to clinic as needed.

## 2016-02-05 NOTE — Progress Notes (Signed)
Called to room to assist during endoscopic procedure.  Patient ID and intended procedure confirmed with present staff. Received instructions for my participation in the procedure from the performing physician.  

## 2016-02-05 NOTE — Op Note (Signed)
[facility] Patient Name: Sara Medina Procedure Date: 02/05/2016 10:03 AM MRN: [id number] Endoscopist: Mauri Pole , MD Age: 82 Referring MD:  Date of Birth: Jul 24, 1929 Gender: Female Account #: [id number] Procedure:                Upper GI endoscopy Indications:              Upper abdominal symptoms that persist despite an                            appropriate trial of therapy, New-onset upper                            abdominal symptoms in patient older than 50 years,                            Upper abdominal symptoms associated with anorexia                            (suggesting structural disease) Medicines:                Monitored Anesthesia Care Procedure:                Pre-Anesthesia Assessment:                           - Prior to the procedure, a History and Physical                            was performed, and patient medications and                            allergies were reviewed. The patient's tolerance of                            previous anesthesia was also reviewed. The risks                            and benefits of the procedure and the sedation                            options and risks were discussed with the patient.                            All questions were answered, and informed consent                            was obtained. Prior Anticoagulants: The patient has                            taken no previous anticoagulant or antiplatelet                            agents. ASA Grade Assessment: II - A patient with  mild systemic disease. After reviewing the risks                            and benefits, the patient was deemed in                            satisfactory condition to undergo the procedure.                           After obtaining informed consent, the endoscope was                            passed under direct vision. Throughout the                            procedure, the patient's  blood pressure, pulse, and                            oxygen saturations were monitored continuously. The                            Model GIF-HQ190 [id number]) scope was introduced                            through the mouth, and advanced to the second part                            of duodenum. The upper GI endoscopy was                            accomplished without difficulty. The patient                            tolerated the procedure well. Scope In: Scope Out: Findings:                 The esophagus was normal.                           A few localized, less than 5 mm erosions were found                            in the gastric antrum. There were stigmata of                            recent bleeding. Biopsies were taken with a cold                            forceps for Helicobacter pylori testing using                            CLOtest.                           The examined duodenum was normal. Complications:  No immediate complications. Estimated Blood Loss:     Estimated blood loss was minimal. Impression:               - Normal esophagus.                           - Bleeding erosive gastropathy. Biopsied.                           - Normal examined duodenum. Recommendation:           - Patient has a contact number available for                            emergencies. The signs and symptoms of potential                            delayed complications were discussed with the                            patient. Return to normal activities tomorrow.                            Written discharge instructions were provided to the                            patient.                           - Resume previous diet.                           - Continue present medications.                           - Await pathology results.                           - No ibuprofen, naproxen, or other non-steroidal                            anti-inflammatory drugs.                            - No repeat upper endoscopy.                           - Return to GI office PRN. Mauri Pole, MD 02/05/2016 10:17:43 AM This report has been signed electronically.

## 2016-02-05 NOTE — Progress Notes (Signed)
To recovery, report to Mirts, RN, VSS. 

## 2016-02-08 ENCOUNTER — Telehealth: Payer: Self-pay | Admitting: *Deleted

## 2016-02-08 NOTE — Telephone Encounter (Signed)
  Follow up Call-  Call back number 02/05/2016  Post procedure Call Back phone  # [phone]  Permission to leave phone message Yes  Some recent data might be hidden     Patient questions:  Do you have a fever, pain , or abdominal swelling? No. Pain Score  0 *  Have you tolerated food without any problems? Yes.    Have you been able to return to your normal activities? Yes.    Do you have any questions about your discharge instructions: Diet   No. Medications  No. Follow up visit  No.  Do you have questions or concerns about your Care? No.  Actions: * If pain score is 4 or above: No action needed, pain <4.

## 2016-02-08 NOTE — Addendum Note (Signed)
Addended by: Randall Hiss B on: 02/08/2016 01:23 PM   Modules accepted: Orders

## 2016-02-09 DIAGNOSIS — M5136 Other intervertebral disc degeneration, lumbar region: Secondary | ICD-10-CM | POA: Diagnosis not present

## 2016-02-09 DIAGNOSIS — M9903 Segmental and somatic dysfunction of lumbar region: Secondary | ICD-10-CM | POA: Diagnosis not present

## 2016-02-09 DIAGNOSIS — M545 Low back pain: Secondary | ICD-10-CM | POA: Diagnosis not present

## 2016-02-09 DIAGNOSIS — M47816 Spondylosis without myelopathy or radiculopathy, lumbar region: Secondary | ICD-10-CM | POA: Diagnosis not present

## 2016-02-09 LAB — HELICOBACTER PYLORI SCREEN-BIOPSY: UREASE: NEGATIVE

## 2016-02-11 ENCOUNTER — Ambulatory Visit (INDEPENDENT_AMBULATORY_CARE_PROVIDER_SITE_OTHER): Payer: Medicare Other

## 2016-02-11 DIAGNOSIS — Z23 Encounter for immunization: Secondary | ICD-10-CM

## 2016-02-23 DIAGNOSIS — M9903 Segmental and somatic dysfunction of lumbar region: Secondary | ICD-10-CM | POA: Diagnosis not present

## 2016-02-23 DIAGNOSIS — M5136 Other intervertebral disc degeneration, lumbar region: Secondary | ICD-10-CM | POA: Diagnosis not present

## 2016-02-23 DIAGNOSIS — M545 Low back pain: Secondary | ICD-10-CM | POA: Diagnosis not present

## 2016-02-23 DIAGNOSIS — M47816 Spondylosis without myelopathy or radiculopathy, lumbar region: Secondary | ICD-10-CM | POA: Diagnosis not present

## 2016-03-03 ENCOUNTER — Other Ambulatory Visit: Payer: Self-pay | Admitting: Emergency Medicine

## 2016-03-03 MED ORDER — ALBUTEROL SULFATE HFA 108 (90 BASE) MCG/ACT IN AERS: INHALATION_SPRAY | RESPIRATORY_TRACT | 2 refills | Status: DC

## 2016-03-08 DIAGNOSIS — M5136 Other intervertebral disc degeneration, lumbar region: Secondary | ICD-10-CM | POA: Diagnosis not present

## 2016-03-08 DIAGNOSIS — M545 Low back pain: Secondary | ICD-10-CM | POA: Diagnosis not present

## 2016-03-08 DIAGNOSIS — M9903 Segmental and somatic dysfunction of lumbar region: Secondary | ICD-10-CM | POA: Diagnosis not present

## 2016-03-08 DIAGNOSIS — M47816 Spondylosis without myelopathy or radiculopathy, lumbar region: Secondary | ICD-10-CM | POA: Diagnosis not present

## 2016-03-16 ENCOUNTER — Other Ambulatory Visit: Payer: Self-pay | Admitting: Family Medicine

## 2016-03-17 NOTE — Telephone Encounter (Signed)
Sent to the pharmacy by e-scribe for 90 days.  Pt has upcoming cpx on 04/26/16.

## 2016-03-22 DIAGNOSIS — M9903 Segmental and somatic dysfunction of lumbar region: Secondary | ICD-10-CM | POA: Diagnosis not present

## 2016-03-22 DIAGNOSIS — M545 Low back pain: Secondary | ICD-10-CM | POA: Diagnosis not present

## 2016-03-22 DIAGNOSIS — M5136 Other intervertebral disc degeneration, lumbar region: Secondary | ICD-10-CM | POA: Diagnosis not present

## 2016-03-22 DIAGNOSIS — M47816 Spondylosis without myelopathy or radiculopathy, lumbar region: Secondary | ICD-10-CM | POA: Diagnosis not present

## 2016-04-05 DIAGNOSIS — M9901 Segmental and somatic dysfunction of cervical region: Secondary | ICD-10-CM | POA: Diagnosis not present

## 2016-04-05 DIAGNOSIS — M47812 Spondylosis without myelopathy or radiculopathy, cervical region: Secondary | ICD-10-CM | POA: Diagnosis not present

## 2016-04-05 DIAGNOSIS — M5030 Other cervical disc degeneration, unspecified cervical region: Secondary | ICD-10-CM | POA: Diagnosis not present

## 2016-04-05 DIAGNOSIS — M542 Cervicalgia: Secondary | ICD-10-CM | POA: Diagnosis not present

## 2016-04-19 DIAGNOSIS — M47812 Spondylosis without myelopathy or radiculopathy, cervical region: Secondary | ICD-10-CM | POA: Diagnosis not present

## 2016-04-19 DIAGNOSIS — M9901 Segmental and somatic dysfunction of cervical region: Secondary | ICD-10-CM | POA: Diagnosis not present

## 2016-04-19 DIAGNOSIS — M542 Cervicalgia: Secondary | ICD-10-CM | POA: Diagnosis not present

## 2016-04-19 DIAGNOSIS — M5030 Other cervical disc degeneration, unspecified cervical region: Secondary | ICD-10-CM | POA: Diagnosis not present

## 2016-04-26 ENCOUNTER — Ambulatory Visit (INDEPENDENT_AMBULATORY_CARE_PROVIDER_SITE_OTHER): Payer: Medicare Other | Admitting: Family Medicine

## 2016-04-26 ENCOUNTER — Encounter: Payer: Self-pay | Admitting: Family Medicine

## 2016-04-26 VITALS — BP 152/62 | HR 75 | Temp 97.3°F | Ht 63.0 in | Wt 124.9 lb

## 2016-04-26 DIAGNOSIS — E039 Hypothyroidism, unspecified: Secondary | ICD-10-CM | POA: Diagnosis not present

## 2016-04-26 DIAGNOSIS — I10 Essential (primary) hypertension: Secondary | ICD-10-CM | POA: Diagnosis not present

## 2016-04-26 DIAGNOSIS — Z0000 Encounter for general adult medical examination without abnormal findings: Secondary | ICD-10-CM

## 2016-04-26 LAB — TSH: TSH: 3.09 u[IU]/mL (ref 0.35–4.50)

## 2016-04-26 LAB — BASIC METABOLIC PANEL
BUN: 10 mg/dL (ref 6–23)
CALCIUM: 9.2 mg/dL (ref 8.4–10.5)
CHLORIDE: 98 meq/L (ref 96–112)
CO2: 24 meq/L (ref 19–32)
CREATININE: 0.82 mg/dL (ref 0.40–1.20)
GFR: 70.55 mL/min (ref >60.00)
GLUCOSE: 101 mg/dL — AB (ref 70–99)
Potassium: 4.4 mEq/L (ref 3.5–5.1)
Sodium: 130 mEq/L — ABNORMAL LOW (ref 135–145)

## 2016-04-26 NOTE — Patient Instructions (Signed)
Monitor blood pressure and be in touch if consistently > 140/90 Stay active physically. Continue with yearly flu vaccine.  Health Maintenance  Topic Date Due  . PNA vac Low Risk Adult (2 of 2 - PCV13) 04/13/2025 (Originally 03/03/2004)  . TETANUS/TDAP  02/26/2022  . INFLUENZA VACCINE  Completed  . DEXA SCAN  Completed  . ZOSTAVAX  Completed

## 2016-04-26 NOTE — Progress Notes (Signed)
Subjective:     Patient ID: Sara Medina, female   DOB: 1929/11/12, 82 y.o.   MRN: [id number]  HPI Patient seen for Medicare subsequent annual wellness visit and medical follow-up. Generally very healthy. She exercises most days of the week. Never smoked. She taught physical education for several years. She had bone density study last year that was excellent, especially for her age. She does have history of chronic GI issues and has history of gastroparesis. She has been following a low residue/ low fat diet which helps. She had upper endoscopy last year which was unremarkable. She's had previous reaction to Pneumovax and has declined Prevnar vaccine. She had mammogram last May which was normal.  Hypertension currently treated with metoprolol. She monitors blood pressure closely at home and most her systolics range between 123456 to 130. Diastolics consistently around 20. She has chronic hearing loss and has hearing aid for that. No recent falls. She tends to have some sadness in the Winter near the holidays but overall is doing well with St. John's wort  Past Medical History:  Diagnosis Date  . Allergy   . Arthritis   . Asthma   . Colon polyps   . Depression   . Diverticulosis   . GERD (gastroesophageal reflux disease)   . History of diverticulitis of colon   . Hyperlipidemia   . Hypertension   . Mitral valve prolapse   . Thyroid disease    Past Surgical History:  Procedure Laterality Date  . ABDOMINAL HYSTERECTOMY    . SHOULDER SURGERY Right   . TONSILLECTOMY    . trigger thumb Right     reports that she has never smoked. She has never used smokeless tobacco. She reports that she drinks alcohol. She reports that she does not use drugs. family history includes Cancer in her sister; Heart disease in her father; Ovarian cancer in her mother; Stroke in her father. Allergies  Allergen Reactions  . Peanut-Containing Drug Products   . Penicillins    1.  Risk factors based on Past Medical  , Social, and Family history reviewed and as indicated above with no changes 2.  Limitations in physical activities None.  No recent falls. 3.  Depression/mood No active depression or anxiety issues- or than some mild ?seasonal affective disorder. 4.  Hearing chronic bilateral hearing loss and uses hearing aids. 5.  ADLs independent in all. 6.  Cognitive function (orientation to time and place, language, writing, speech,memory) no short or long term memory issues.  Language and judgement intact. 7.  Home Safety no issues 8.  Height, weight, and visual acuity.all stable. 9.  Counseling discussed continued regular weight bearing exercise. 10. Recommendation of preventive services. Prevnar 13 declined.  Flu vaccine already given. 11. Labs based on risk factors-TSH, BMP. 12. Care Plan- as above. 13. Other Providers-Dr Nandigam [facility] 14. Written schedule of screening/prevention services given to patient.   Review of Systems  Constitutional: Negative for activity change, appetite change, fatigue, fever and unexpected weight change.  HENT: Negative for ear pain, hearing loss, sore throat and trouble swallowing.   Eyes: Negative for visual disturbance.  Respiratory: Negative for cough and shortness of breath.   Cardiovascular: Negative for chest pain and palpitations.  Gastrointestinal: Negative for abdominal pain, blood in stool, constipation and diarrhea.  Genitourinary: Negative for dysuria and hematuria.  Musculoskeletal: Negative for arthralgias, back pain and myalgias.  Skin: Negative for rash.  Neurological: Negative for dizziness, syncope and headaches.  Hematological: Negative for adenopathy.  Psychiatric/Behavioral: Negative for confusion and dysphoric mood.       Objective:   Physical Exam  Constitutional: She is oriented to person, place, and time. She appears well-developed and well-nourished.  HENT:  Head: Normocephalic and atraumatic.  Eyes: EOM are normal. Pupils are equal,  round, and reactive to light.  Neck: Normal range of motion. Neck supple. No thyromegaly present.  Cardiovascular: Normal rate, regular rhythm and normal heart sounds.   No murmur heard. Pulmonary/Chest: Breath sounds normal. No respiratory distress. She has no wheezes. She has no rales.  Abdominal: Soft. Bowel sounds are normal. She exhibits no distension and no mass. There is no tenderness. There is no rebound and no guarding.  Musculoskeletal: Normal range of motion. She exhibits no edema.  Lymphadenopathy:    She has no cervical adenopathy.  Neurological: She is alert and oriented to person, place, and time. She displays normal reflexes. No cranial nerve deficit.  Skin: No rash noted.  Psychiatric: She has a normal mood and affect. Her behavior is normal. Judgment and thought content normal.       Assessment:     #1 Medicare subsequent annual wellness visit. Patient declines Prevnar 13. Flu vaccine already given. Continue yearly mammogram  #2 hypertension. Slightly elevated reading today but very well controlled by home readings.  #3 hypothyroidism    Plan:     -Recheck TSH and basic metabolic panel -Continue regular weightbearing exercise -Continue daily calcium and vitamin D -Continue metoprolol which she's taken the past for palpitations and mildly elevate blood pressure  Eulas Post MD [facility]

## 2016-04-26 NOTE — Progress Notes (Signed)
Pre visit review using our clinic review tool, if applicable. No additional management support is needed unless otherwise documented below in the visit note. 

## 2016-05-03 DIAGNOSIS — M5030 Other cervical disc degeneration, unspecified cervical region: Secondary | ICD-10-CM | POA: Diagnosis not present

## 2016-05-03 DIAGNOSIS — M542 Cervicalgia: Secondary | ICD-10-CM | POA: Diagnosis not present

## 2016-05-03 DIAGNOSIS — M47812 Spondylosis without myelopathy or radiculopathy, cervical region: Secondary | ICD-10-CM | POA: Diagnosis not present

## 2016-05-03 DIAGNOSIS — M9901 Segmental and somatic dysfunction of cervical region: Secondary | ICD-10-CM | POA: Diagnosis not present

## 2016-05-17 DIAGNOSIS — M9901 Segmental and somatic dysfunction of cervical region: Secondary | ICD-10-CM | POA: Diagnosis not present

## 2016-05-17 DIAGNOSIS — M5030 Other cervical disc degeneration, unspecified cervical region: Secondary | ICD-10-CM | POA: Diagnosis not present

## 2016-05-17 DIAGNOSIS — M542 Cervicalgia: Secondary | ICD-10-CM | POA: Diagnosis not present

## 2016-05-17 DIAGNOSIS — M47812 Spondylosis without myelopathy or radiculopathy, cervical region: Secondary | ICD-10-CM | POA: Diagnosis not present

## 2016-06-14 ENCOUNTER — Other Ambulatory Visit: Payer: Self-pay | Admitting: Family

## 2016-06-14 ENCOUNTER — Other Ambulatory Visit: Payer: Self-pay | Admitting: Family Medicine

## 2016-06-23 ENCOUNTER — Telehealth: Payer: Self-pay | Admitting: Family Medicine

## 2016-06-23 NOTE — Telephone Encounter (Signed)
° ° °  Pt request refill of the following:  metoprolol tartrate (LOPRESSOR) 25 MG tablet   Phamacy:

## 2016-06-24 MED ORDER — METOPROLOL TARTRATE 25 MG PO TABS: ORAL_TABLET | ORAL | 3 refills | Status: DC

## 2016-06-24 NOTE — Telephone Encounter (Signed)
Refill for one year 

## 2016-06-24 NOTE — Telephone Encounter (Signed)
Rx done. 

## 2016-07-12 DIAGNOSIS — M9901 Segmental and somatic dysfunction of cervical region: Secondary | ICD-10-CM | POA: Diagnosis not present

## 2016-07-12 DIAGNOSIS — M47812 Spondylosis without myelopathy or radiculopathy, cervical region: Secondary | ICD-10-CM | POA: Diagnosis not present

## 2016-07-12 DIAGNOSIS — M5030 Other cervical disc degeneration, unspecified cervical region: Secondary | ICD-10-CM | POA: Diagnosis not present

## 2016-07-12 DIAGNOSIS — M542 Cervicalgia: Secondary | ICD-10-CM | POA: Diagnosis not present

## 2016-07-26 DIAGNOSIS — M9901 Segmental and somatic dysfunction of cervical region: Secondary | ICD-10-CM | POA: Diagnosis not present

## 2016-07-26 DIAGNOSIS — M5030 Other cervical disc degeneration, unspecified cervical region: Secondary | ICD-10-CM | POA: Diagnosis not present

## 2016-07-26 DIAGNOSIS — M47812 Spondylosis without myelopathy or radiculopathy, cervical region: Secondary | ICD-10-CM | POA: Diagnosis not present

## 2016-07-26 DIAGNOSIS — M542 Cervicalgia: Secondary | ICD-10-CM | POA: Diagnosis not present

## 2016-08-09 DIAGNOSIS — M5030 Other cervical disc degeneration, unspecified cervical region: Secondary | ICD-10-CM | POA: Diagnosis not present

## 2016-08-09 DIAGNOSIS — M9901 Segmental and somatic dysfunction of cervical region: Secondary | ICD-10-CM | POA: Diagnosis not present

## 2016-08-09 DIAGNOSIS — M47812 Spondylosis without myelopathy or radiculopathy, cervical region: Secondary | ICD-10-CM | POA: Diagnosis not present

## 2016-08-09 DIAGNOSIS — M542 Cervicalgia: Secondary | ICD-10-CM | POA: Diagnosis not present

## 2016-08-23 ENCOUNTER — Ambulatory Visit (INDEPENDENT_AMBULATORY_CARE_PROVIDER_SITE_OTHER): Payer: Medicare Other | Admitting: Family Medicine

## 2016-08-23 ENCOUNTER — Encounter: Payer: Self-pay | Admitting: Family Medicine

## 2016-08-23 VITALS — BP 150/80 | HR 74 | Temp 97.5°F | Wt 118.0 lb

## 2016-08-23 DIAGNOSIS — M542 Cervicalgia: Secondary | ICD-10-CM | POA: Diagnosis not present

## 2016-08-23 DIAGNOSIS — K529 Noninfective gastroenteritis and colitis, unspecified: Secondary | ICD-10-CM

## 2016-08-23 DIAGNOSIS — M47812 Spondylosis without myelopathy or radiculopathy, cervical region: Secondary | ICD-10-CM | POA: Diagnosis not present

## 2016-08-23 DIAGNOSIS — M9901 Segmental and somatic dysfunction of cervical region: Secondary | ICD-10-CM | POA: Diagnosis not present

## 2016-08-23 DIAGNOSIS — M5030 Other cervical disc degeneration, unspecified cervical region: Secondary | ICD-10-CM | POA: Diagnosis not present

## 2016-08-23 LAB — COMPREHENSIVE METABOLIC PANEL
ALK PHOS: 42 U/L (ref 39–117)
ALT: 29 U/L (ref 0–35)
AST: 26 U/L (ref 0–37)
Albumin: 4 g/dL (ref 3.5–5.2)
BILIRUBIN TOTAL: 0.2 mg/dL (ref 0.2–1.2)
BUN: 13 mg/dL (ref 6–23)
CO2: 27 mEq/L (ref 19–32)
Calcium: 9.4 mg/dL (ref 8.4–10.5)
Chloride: 100 mEq/L (ref 96–112)
Creatinine, Ser: 0.71 mg/dL (ref 0.40–1.20)
GFR: 83.24 mL/min (ref >60.00)
GLUCOSE: 97 mg/dL (ref 70–99)
Potassium: 4.5 mEq/L (ref 3.5–5.1)
SODIUM: 134 meq/L — AB (ref 135–145)
Total Protein: 6.3 g/dL (ref 6.0–8.3)

## 2016-08-23 LAB — CBC WITH DIFFERENTIAL/PLATELET
BASOS ABS: 0 10*3/uL (ref 0.0–0.1)
Basophils Relative: 0.4 % (ref 0.0–3.0)
EOS PCT: 1.2 % (ref 0.0–5.0)
Eosinophils Absolute: 0.1 10*3/uL (ref 0.0–0.7)
HCT: 40.4 % (ref 36.0–46.0)
Hemoglobin: 13.8 g/dL (ref 12.0–15.0)
LYMPHS PCT: 15.4 % (ref 12.0–46.0)
Lymphs Abs: 1.2 10*3/uL (ref 0.7–4.0)
MCHC: 34.1 g/dL (ref 30.0–36.0)
MCV: 95.4 fl (ref 78.0–100.0)
MONOS PCT: 7.3 % (ref 3.0–12.0)
Monocytes Absolute: 0.6 10*3/uL (ref 0.1–1.0)
NEUTROS PCT: 75.7 % (ref 43.0–77.0)
Neutro Abs: 6.1 10*3/uL (ref 1.4–7.7)
Platelets: 376 10*3/uL (ref 150.0–400.0)
RBC: 4.24 Mil/uL (ref 3.87–5.11)
RDW: 12.7 % (ref 11.5–15.5)
WBC: 8 10*3/uL (ref 4.0–10.5)

## 2016-08-23 NOTE — Progress Notes (Signed)
Subjective:     Patient ID: Sara Medina, female   DOB: [birth year], 83 y.o.   MRN: [id number]  HPI   Patient seen with diarrhea for the past month or so. She states diarrhea is usually early in the morning.   She has up to 8 or 10 watery brown stools. Never had any blood.   Lasts for couple hours and then stools slowdown and she generally does not have any problems later in the day. She's had over 20 years of frequent stools but this pattern is somewhat different. She's not had any recent antibiotics. No recent travels. No abdominal pain. She takes Metamucil with some improvement. She had normal TSH back in November. Has occasionally taken Imodium in the past but not regularly. She states in general she is very sensitive to medications. No recent dietary changes. No history of lactose intolerance. No history of known gluten intolerance.  She has had some loss of appetite and has lost about 8-9 pounds by her scales in recent months. She had some dyspepsia and saw GI back in September and upper endoscopy was done with negative Helicobacter pylori. Patient declined colonoscopy at that time. Last colonoscopy over 10 years ago.  Past Medical History:  Diagnosis Date  . Allergy   . Arthritis   . Asthma   . Colon polyps   . Depression   . Diverticulosis   . GERD (gastroesophageal reflux disease)   . History of diverticulitis of colon   . Hyperlipidemia   . Hypertension   . Mitral valve prolapse   . Thyroid disease    Past Surgical History:  Procedure Laterality Date  . ABDOMINAL HYSTERECTOMY    . SHOULDER SURGERY Right   . TONSILLECTOMY    . trigger thumb Right     reports that she has never smoked. She has never used smokeless tobacco. She reports that she drinks alcohol. She reports that she does not use drugs. family history includes Cancer in her sister; Heart disease in her father; Ovarian cancer in her mother; Stroke in her father. Allergies  Allergen Reactions  . Peanut-Containing  Drug Products   . Penicillins      Review of Systems  Constitutional: Positive for appetite change. Negative for chills and fever.  Respiratory: Negative for shortness of breath.   Cardiovascular: Negative for chest pain.  Gastrointestinal: Positive for diarrhea. Negative for abdominal pain, blood in stool, constipation, nausea and vomiting.  Neurological: Negative for dizziness.       Objective:   Physical Exam  Constitutional: She appears well-developed and well-nourished.  HENT:  Mouth/Throat: Oropharynx is clear and moist.  Cardiovascular: Normal rate and regular rhythm.   Pulmonary/Chest: Effort normal and breath sounds normal. No respiratory distress. She has no wheezes. She has no rales.  Abdominal: Soft. Bowel sounds are normal. She exhibits no distension and no mass. There is no tenderness. There is no rebound and no guarding.  Musculoskeletal: She exhibits no edema.       Assessment:     Diarrhea. Patient has history of some chronic diarrhea but worsening over the past month or so with some associated weight loss. No recent travels or antibiotics. Symptoms are much worse early in the morning. Differential includes IBS, colitis of some sort versus other. Doubt infectious    Plan:     -Check CBC and comprehensive metabolic panel -GI pathogens panel -Consider referral back to GI pending results above. May need eventual colonoscopy to help sort out  Alinda Sierras  Marykate Heuberger MD [facility]

## 2016-08-23 NOTE — Patient Instructions (Signed)

## 2016-08-23 NOTE — Progress Notes (Signed)
Pre visit review using our clinic review tool, if applicable. No additional management support is needed unless otherwise documented below in the visit note. 

## 2016-08-25 LAB — GASTROINTESTINAL PATHOGEN PANEL PCR
C. difficile Tox A/B, PCR: NOT DETECTED
CAMPYLOBACTER, PCR: NOT DETECTED
Cryptosporidium, PCR: NOT DETECTED
E coli (ETEC) LT/ST PCR: NOT DETECTED
E coli (STEC) stx1/stx2, PCR: NOT DETECTED
E coli 0157, PCR: NOT DETECTED
GIARDIA LAMBLIA, PCR: NOT DETECTED
Norovirus, PCR: NOT DETECTED
ROTAVIRUS, PCR: NOT DETECTED
SALMONELLA, PCR: NOT DETECTED
SHIGELLA, PCR: NOT DETECTED

## 2016-09-06 DIAGNOSIS — M9901 Segmental and somatic dysfunction of cervical region: Secondary | ICD-10-CM | POA: Diagnosis not present

## 2016-09-06 DIAGNOSIS — M5030 Other cervical disc degeneration, unspecified cervical region: Secondary | ICD-10-CM | POA: Diagnosis not present

## 2016-09-06 DIAGNOSIS — M47812 Spondylosis without myelopathy or radiculopathy, cervical region: Secondary | ICD-10-CM | POA: Diagnosis not present

## 2016-09-06 DIAGNOSIS — M542 Cervicalgia: Secondary | ICD-10-CM | POA: Diagnosis not present

## 2016-09-08 ENCOUNTER — Telehealth: Payer: Self-pay

## 2016-09-08 ENCOUNTER — Other Ambulatory Visit: Payer: Self-pay | Admitting: Family Medicine

## 2016-09-08 NOTE — Telephone Encounter (Signed)
PA approved, form faxed back to pharmacy. 

## 2016-09-08 NOTE — Telephone Encounter (Signed)
Received PA request from [facility] for Estradiol. PA submitted & is pending. Key: [id number]

## 2016-09-20 DIAGNOSIS — M542 Cervicalgia: Secondary | ICD-10-CM | POA: Diagnosis not present

## 2016-09-20 DIAGNOSIS — M9901 Segmental and somatic dysfunction of cervical region: Secondary | ICD-10-CM | POA: Diagnosis not present

## 2016-09-20 DIAGNOSIS — M5030 Other cervical disc degeneration, unspecified cervical region: Secondary | ICD-10-CM | POA: Diagnosis not present

## 2016-09-20 DIAGNOSIS — M47812 Spondylosis without myelopathy or radiculopathy, cervical region: Secondary | ICD-10-CM | POA: Diagnosis not present

## 2016-09-27 DIAGNOSIS — M542 Cervicalgia: Secondary | ICD-10-CM | POA: Diagnosis not present

## 2016-09-27 DIAGNOSIS — M9901 Segmental and somatic dysfunction of cervical region: Secondary | ICD-10-CM | POA: Diagnosis not present

## 2016-09-27 DIAGNOSIS — M5030 Other cervical disc degeneration, unspecified cervical region: Secondary | ICD-10-CM | POA: Diagnosis not present

## 2016-09-27 DIAGNOSIS — M47812 Spondylosis without myelopathy or radiculopathy, cervical region: Secondary | ICD-10-CM | POA: Diagnosis not present

## 2016-10-07 ENCOUNTER — Ambulatory Visit (INDEPENDENT_AMBULATORY_CARE_PROVIDER_SITE_OTHER): Payer: Medicare Other | Admitting: Gastroenterology

## 2016-10-07 ENCOUNTER — Encounter: Payer: Self-pay | Admitting: Gastroenterology

## 2016-10-07 VITALS — BP 136/80 | Ht 63.0 in | Wt 122.0 lb

## 2016-10-07 DIAGNOSIS — K580 Irritable bowel syndrome with diarrhea: Secondary | ICD-10-CM | POA: Diagnosis not present

## 2016-10-07 DIAGNOSIS — R197 Diarrhea, unspecified: Secondary | ICD-10-CM

## 2016-10-07 DIAGNOSIS — K219 Gastro-esophageal reflux disease without esophagitis: Secondary | ICD-10-CM | POA: Diagnosis not present

## 2016-10-07 NOTE — Patient Instructions (Signed)
Follow up as needed

## 2016-10-07 NOTE — Progress Notes (Signed)
Sara Medina    [id number]    Jul 05, 1929  Primary Care Physician:Burchette, Alinda Sierras, MD  Referring Physician: Eulas Post, MD [name], [place] [address]  Chief complaint:  Diarrhea  HPI: 83 year old female here for follow-up visit accompanied by her daughter. She was last seen in office by Ellouise Newer in September 2017. She complains that she developed watery diarrhea multiple episodes on February 15, for one day she had associated nausea and vomiting which resolved in 12-24 hours but she continued to have diarrhea that lasted about 6 and half weeks and hasn't had any episodes since 08/28/2016. She has history of irritable bowel syndrome predominant diarrhea with intermittent episodes of exacerbation of diarrhea but during the 6 weeks period symptoms were severe with watery bowel movements. GI pathogen panel was negative. Patient denies any change in medications or diet. She takes Aleve every night for arthritis and myalgia. No other over-the-counter herbal remedies or medications.  She is following gastroparesis diet with small frequent meals and trying to avoid red meat and high-fiber. Currently she is having one to 2 formed bowel movements daily. Denies any nausea, vomiting, abdominal pain, melena or bright red blood per rectum   Outpatient Encounter Prescriptions as of 10/07/2016  Medication Sig  . albuterol (PROAIR HFA) 108 (90 Base) MCG/ACT inhaler INHALE 2 PUFFS INTO THE LUNGS EVERY 6 (SIX) HOURS AS NEEDED FOR WHEEZING OR SHORTNESS OF BREATH.  Marland Kitchen aspirin 81 MG tablet Take 81 mg by mouth daily.    . Cholecalciferol (VITAMIN D-3 PO) Take 1,000 Int'l Units by mouth daily.  Marland Kitchen EPINEPHrine 0.3 mg/0.3 mL IJ SOAJ injection Inject 0.3 mLs (0.3 mg total) into the muscle once.  Marland Kitchen estradiol (ESTRACE) 0.5 MG tablet TAKE 1 TABLET (0.5 MG TOTAL) BY MOUTH DAILY.  . famotidine (PEPCID) 20 MG tablet Take 20 mg by mouth daily.  . folic acid (FOLVITE) 466 MCG  tablet Take 400 mcg by mouth 2 (two) times daily.  Marland Kitchen levothyroxine (SYNTHROID, LEVOTHROID) 25 MCG tablet TAKE ONE TABLET BY MOUTH DAILY BEFORE BREAKFAST  . metoprolol tartrate (LOPRESSOR) 25 MG tablet 50 MG IN THE MORNING AND 25 IN THE EVENING  . Probiotic Product (ALIGN PO) Take by mouth.  . pyridoxine (B-6) 100 MG tablet Take 100 mg by mouth daily.  . vitamin B-12 (CYANOCOBALAMIN) 500 MCG tablet Take 1,000 mcg by mouth 2 (two) times daily.   . [DISCONTINUED] St Johns Wort 300 MG TABS Take 1 tablet by mouth 3 (three) times daily.    Facility-Administered Encounter Medications as of 10/07/2016  Medication  . 0.9 %  sodium chloride infusion    Allergies as of 10/07/2016 - Review Complete 10/07/2016  Allergen Reaction Noted  . Peanut-containing drug products  06/21/2011  . Penicillins  05/04/2010    Past Medical History:  Diagnosis Date  . Allergy   . Arthritis   . Asthma   . Colon polyps   . Depression   . Diverticulosis   . GERD (gastroesophageal reflux disease)   . History of diverticulitis of colon   . Hyperlipidemia   . Hypertension   . Mitral valve prolapse   . Thyroid disease     Past Surgical History:  Procedure Laterality Date  . ABDOMINAL HYSTERECTOMY    . SHOULDER SURGERY Right   . TONSILLECTOMY    . trigger thumb Right     Family History  Problem Relation Age of Onset  .  Ovarian cancer Mother   . Heart disease Father   . Stroke Father   . Cancer Sister        brain  . Colon cancer Neg Hx     Social History   Social History  . Marital status: Widowed    Spouse name: N/A  . Number of children: 3  . Years of education: N/A   Occupational History  . retired Retired   Social History Main Topics  . Smoking status: Never Smoker  . Smokeless tobacco: Never Used  . Alcohol use 0.0 oz/week     Comment: Couple glasses of wine per week  . Drug use: No  . Sexual activity: Not Currently   Other Topics Concern  . Not on file   Social History  Narrative  . No narrative on file      Review of systems: Review of Systems  Constitutional: Negative for fever and chills.  HENT: Negative.   Eyes: Negative for blurred vision.  Respiratory: Negative for cough, shortness of breath and wheezing.   Cardiovascular: Negative for chest pain and palpitations.  Gastrointestinal: as per HPI Genitourinary: Negative for dysuria, urgency, frequency and hematuria.  Musculoskeletal: Positive for myalgias, back pain and joint pain.  Skin: Negative for itching and rash.  Neurological: Negative for dizziness, tremors, focal weakness, seizures and loss of consciousness.  Endo/Heme/Allergies: Positive for seasonal allergies.  Psychiatric/Behavioral: Negative for depression, suicidal ideas and hallucinations.  All other systems reviewed and are negative.   Physical Exam: Vitals:   10/07/16 1346  BP: 136/80   Body mass index is 21.61 kg/m. Gen:      No acute distress HEENT:  EOMI, sclera anicteric Neck:     No masses; no thyromegaly Lungs:    Clear to auscultation bilaterally; normal respiratory effort CV:         Regular rate and rhythm; no murmurs Abd:      + bowel sounds; soft, non-tender; no palpable masses, no distension Ext:    No edema; adequate peripheral perfusion Skin:      Warm and dry; no rash Neuro: alert and oriented x 3 Psych: normal mood and affect  Data Reviewed:  Reviewed labs, radiology imaging, old records and pertinent past GI work up   Assessment and Plan/Recommendations: 83 year old female with a history of chronic GERD, dyspepsia and irritable bowel syndrome predominant diarrhea here for follow-up visit  Unclear etiology for exacerbation of diarrhea for 6 weeks in Feb and March 2018 ?bacterial overgrowth or worsening of underlying IBS-D or microscopic colitis that self resolved GI pathogen panel was negative She is currently asymptomatic with regular bowel movements Advised patient to minimize  nsaids Continue probiotic Align daily  GERD: Symptoms stable Continue famotidine 20 mg daily Continue antireflux measures  Return as needed  15 minutes was spent face-to-face with the patient. Greater than 50% of the time used for counseling as well as treatment plan and follow-up of IBS-D. She had multiple questions which were answered to her satisfaction  K. Denzil Magnuson , MD [phone] Mon-Fri 8a-5p [phone] after 5p, weekends, holidays  CC: Burchette, Alinda Sierras, MD

## 2016-10-11 DIAGNOSIS — M47812 Spondylosis without myelopathy or radiculopathy, cervical region: Secondary | ICD-10-CM | POA: Diagnosis not present

## 2016-10-11 DIAGNOSIS — M542 Cervicalgia: Secondary | ICD-10-CM | POA: Diagnosis not present

## 2016-10-11 DIAGNOSIS — M5030 Other cervical disc degeneration, unspecified cervical region: Secondary | ICD-10-CM | POA: Diagnosis not present

## 2016-10-11 DIAGNOSIS — M9901 Segmental and somatic dysfunction of cervical region: Secondary | ICD-10-CM | POA: Diagnosis not present

## 2016-10-21 ENCOUNTER — Other Ambulatory Visit: Payer: Self-pay | Admitting: Family Medicine

## 2016-10-21 ENCOUNTER — Other Ambulatory Visit: Payer: Self-pay | Admitting: Physical Therapy

## 2016-10-21 DIAGNOSIS — Z1231 Encounter for screening mammogram for malignant neoplasm of breast: Secondary | ICD-10-CM

## 2016-10-25 DIAGNOSIS — M47812 Spondylosis without myelopathy or radiculopathy, cervical region: Secondary | ICD-10-CM | POA: Diagnosis not present

## 2016-10-25 DIAGNOSIS — M542 Cervicalgia: Secondary | ICD-10-CM | POA: Diagnosis not present

## 2016-10-25 DIAGNOSIS — M5030 Other cervical disc degeneration, unspecified cervical region: Secondary | ICD-10-CM | POA: Diagnosis not present

## 2016-10-25 DIAGNOSIS — M9901 Segmental and somatic dysfunction of cervical region: Secondary | ICD-10-CM | POA: Diagnosis not present

## 2016-10-28 ENCOUNTER — Ambulatory Visit
Admission: RE | Admit: 2016-10-28 | Discharge: 2016-10-28 | Disposition: A | Discharge status: Home / Self Care | Payer: Medicare Other | Source: Ambulatory Visit | Attending: Family Medicine | Admitting: Family Medicine

## 2016-10-28 DIAGNOSIS — Z1231 Encounter for screening mammogram for malignant neoplasm of breast: Secondary | ICD-10-CM | POA: Diagnosis not present

## 2016-11-08 DIAGNOSIS — M47812 Spondylosis without myelopathy or radiculopathy, cervical region: Secondary | ICD-10-CM | POA: Diagnosis not present

## 2016-11-08 DIAGNOSIS — M9901 Segmental and somatic dysfunction of cervical region: Secondary | ICD-10-CM | POA: Diagnosis not present

## 2016-11-08 DIAGNOSIS — M5030 Other cervical disc degeneration, unspecified cervical region: Secondary | ICD-10-CM | POA: Diagnosis not present

## 2016-11-08 DIAGNOSIS — M542 Cervicalgia: Secondary | ICD-10-CM | POA: Diagnosis not present

## 2016-11-22 DIAGNOSIS — M542 Cervicalgia: Secondary | ICD-10-CM | POA: Diagnosis not present

## 2016-11-22 DIAGNOSIS — M47812 Spondylosis without myelopathy or radiculopathy, cervical region: Secondary | ICD-10-CM | POA: Diagnosis not present

## 2016-11-22 DIAGNOSIS — M9901 Segmental and somatic dysfunction of cervical region: Secondary | ICD-10-CM | POA: Diagnosis not present

## 2016-11-22 DIAGNOSIS — M5030 Other cervical disc degeneration, unspecified cervical region: Secondary | ICD-10-CM | POA: Diagnosis not present

## 2016-12-05 ENCOUNTER — Other Ambulatory Visit: Payer: Self-pay | Admitting: Family Medicine

## 2016-12-13 DIAGNOSIS — M5030 Other cervical disc degeneration, unspecified cervical region: Secondary | ICD-10-CM | POA: Diagnosis not present

## 2016-12-13 DIAGNOSIS — M47812 Spondylosis without myelopathy or radiculopathy, cervical region: Secondary | ICD-10-CM | POA: Diagnosis not present

## 2016-12-13 DIAGNOSIS — M542 Cervicalgia: Secondary | ICD-10-CM | POA: Diagnosis not present

## 2016-12-13 DIAGNOSIS — M9901 Segmental and somatic dysfunction of cervical region: Secondary | ICD-10-CM | POA: Diagnosis not present

## 2016-12-27 DIAGNOSIS — M9901 Segmental and somatic dysfunction of cervical region: Secondary | ICD-10-CM | POA: Diagnosis not present

## 2016-12-27 DIAGNOSIS — M542 Cervicalgia: Secondary | ICD-10-CM | POA: Diagnosis not present

## 2016-12-27 DIAGNOSIS — M5030 Other cervical disc degeneration, unspecified cervical region: Secondary | ICD-10-CM | POA: Diagnosis not present

## 2016-12-27 DIAGNOSIS — M47812 Spondylosis without myelopathy or radiculopathy, cervical region: Secondary | ICD-10-CM | POA: Diagnosis not present

## 2016-12-30 ENCOUNTER — Ambulatory Visit (INDEPENDENT_AMBULATORY_CARE_PROVIDER_SITE_OTHER): Payer: Medicare Other | Admitting: Adult Health

## 2016-12-30 ENCOUNTER — Encounter: Payer: Self-pay | Admitting: Adult Health

## 2016-12-30 VITALS — BP 196/100 | Temp 98.0°F | Ht 63.0 in | Wt 128.0 lb

## 2016-12-30 DIAGNOSIS — I10 Essential (primary) hypertension: Secondary | ICD-10-CM | POA: Diagnosis not present

## 2016-12-30 DIAGNOSIS — R32 Unspecified urinary incontinence: Secondary | ICD-10-CM

## 2016-12-30 LAB — CBC WITH DIFFERENTIAL/PLATELET
BASOS ABS: 0 {cells}/uL (ref 0–200)
BASOS PCT: 0 %
EOS ABS: 74 {cells}/uL (ref 15–500)
Eosinophils Relative: 1 %
HEMATOCRIT: 39.1 % (ref 35.0–45.0)
Hemoglobin: 13.1 g/dL (ref 11.7–15.5)
LYMPHS PCT: 21 %
Lymphs Abs: 1554 cells/uL (ref 850–3900)
MCH: 32 pg (ref 27.0–33.0)
MCHC: 33.5 g/dL (ref 32.0–36.0)
MCV: 95.4 fL (ref 80.0–100.0)
MONO ABS: 666 {cells}/uL (ref 200–950)
MPV: 9.7 fL (ref 7.5–12.5)
Monocytes Relative: 9 %
Neutro Abs: 5106 cells/uL (ref 1500–7800)
Neutrophils Relative %: 69 %
Platelets: 315 10*3/uL (ref 140–400)
RBC: 4.1 MIL/uL (ref 3.80–5.10)
RDW: 12.9 % (ref 11.0–15.0)
WBC: 7.4 10*3/uL (ref 3.8–10.8)

## 2016-12-30 LAB — POC URINALSYSI DIPSTICK (AUTOMATED)
Bilirubin, UA: NEGATIVE
Glucose, UA: NEGATIVE
KETONES UA: NEGATIVE
Leukocytes, UA: NEGATIVE
Nitrite, UA: NEGATIVE
PH UA: 6 (ref 5.0–8.0)
PROTEIN UA: NEGATIVE
RBC UA: NEGATIVE
SPEC GRAV UA: 1.01 (ref 1.010–1.025)
UROBILINOGEN UA: 0.2 U/dL

## 2016-12-30 MED ORDER — CLONIDINE HCL 0.1 MG PO TABS: 0.1000 mg | ORAL_TABLET | Freq: Once | ORAL | Status: DC

## 2016-12-30 MED ORDER — AMLODIPINE BESYLATE 5 MG PO TABS: 5.0000 mg | ORAL_TABLET | Freq: Every day | ORAL | 1 refills | Status: DC

## 2016-12-30 NOTE — Patient Instructions (Addendum)
I have spoken with Dr. Elease Hashimoto and he recommended adding a medication called Amlodipine. I have sent this to your pharmacy. Please start this medication when you get it tonight   Follow up with Dr. Elease Hashimoto early next week

## 2016-12-30 NOTE — Addendum Note (Signed)
Addended by: Tomi Likens on: 12/30/2016 04:59 PM   Modules accepted: Orders

## 2016-12-30 NOTE — Progress Notes (Signed)
Subjective:    Patient ID: Sara Medina, female    DOB: 29-Nov-1929, 83 y.o.   MRN: [id number]  HPI 83 year old female, patient of Dr. Elease Hashimoto who  has a past medical history of Allergy; Arthritis; Asthma; Colon polyps; Depression; Diverticulosis; GERD (gastroesophageal reflux disease); History of diverticulitis of colon; Hyperlipidemia; Hypertension; Mitral valve prolapse; and Thyroid disease. She presents to the office today for the complaint of elevated blood pressure readings. Her current regimen includes that of Lopressor 50 mg in the morning and 25 mg in the evening. Per patient over the last three days her readings have been   12/28/2016  AM - 150/75   12/29/2016  AM 158/75 PM 159/67  12/29/2016  AM 182/86, 176,94, 161, 87  PM 186/88, 156/93, 173/81  Today in the office her blood pressure is 196/100  She denies any blurred vision or headaches. She is eating healthier   She does endorse recent increase in cramping of her calf muscles   An additional complaint is that of one instance of urinary incontinence. She denies any other UTI symptoms.   Review of Systems  Constitutional: Negative.   Respiratory: Negative.   Cardiovascular: Negative.   Gastrointestinal: Negative.   Genitourinary:       Incontinence    Neurological: Negative.   Psychiatric/Behavioral: Negative.   All other systems reviewed and are negative.  Past Medical History:  Diagnosis Date  . Allergy   . Arthritis   . Asthma   . Colon polyps   . Depression   . Diverticulosis   . GERD (gastroesophageal reflux disease)   . History of diverticulitis of colon   . Hyperlipidemia   . Hypertension   . Mitral valve prolapse   . Thyroid disease     Social History   Social History  . Marital status: Widowed    Spouse name: N/A  . Number of children: 3  . Years of education: N/A   Occupational History  . retired Retired   Social History Main Topics  . Smoking status: Never Smoker  . Smokeless  tobacco: Never Used  . Alcohol use 0.0 oz/week     Comment: Couple glasses of wine per week  . Drug use: No  . Sexual activity: Not Currently   Other Topics Concern  . Not on file   Social History Narrative  . No narrative on file    Past Surgical History:  Procedure Laterality Date  . ABDOMINAL HYSTERECTOMY    . SHOULDER SURGERY Right   . TONSILLECTOMY    . trigger thumb Right     Family History  Problem Relation Age of Onset  . Ovarian cancer Mother   . Heart disease Father   . Stroke Father   . Cancer Sister        brain  . Breast cancer Maternal Aunt   . Colon cancer Neg Hx     Allergies  Allergen Reactions  . Peanut-Containing Drug Products   . Penicillins     Current Outpatient Prescriptions on File Prior to Visit  Medication Sig Dispense Refill  . albuterol (PROAIR HFA) 108 (90 Base) MCG/ACT inhaler INHALE 2 PUFFS INTO THE LUNGS EVERY 6 (SIX) HOURS AS NEEDED FOR WHEEZING OR SHORTNESS OF BREATH. 8.5 g 2  . aspirin 81 MG tablet Take 81 mg by mouth daily.      . Cholecalciferol (VITAMIN D-3 PO) Take 1,000 Int'l Units by mouth daily.    Marland Kitchen EPINEPHrine 0.3 mg/0.3 mL IJ  SOAJ injection Inject 0.3 mLs (0.3 mg total) into the muscle once. 2 Device 0  . estradiol (ESTRACE) 0.5 MG tablet TAKE 1 TABLET (0.5 MG TOTAL) BY MOUTH DAILY. 90 tablet 1  . famotidine (PEPCID) 20 MG tablet Take 20 mg by mouth daily.    Marland Kitchen levothyroxine (SYNTHROID, LEVOTHROID) 25 MCG tablet TAKE ONE TABLET BY MOUTH DAILY BEFORE BREAKFAST 90 tablet 2  . metoprolol tartrate (LOPRESSOR) 25 MG tablet TAKE TWO TABLETS BY MOUTH EVERY MORNING AND TAKE ONE TABLET BY MOUTH IN THE EVENING 270 tablet 1  . Probiotic Product (ALIGN PO) Take by mouth.    . pyridoxine (B-6) 100 MG tablet Take 100 mg by mouth daily.    . vitamin B-12 (CYANOCOBALAMIN) 500 MCG tablet Take 1,000 mcg by mouth 2 (two) times daily.     . folic acid (FOLVITE) 229 MCG tablet Take 400 mcg by mouth 2 (two) times daily.     Current  Facility-Administered Medications on File Prior to Visit  Medication Dose Route Frequency Provider Last Rate Last Dose  . 0.9 %  sodium chloride infusion  500 mL Intravenous Continuous Nandigam, Kavitha V, MD        BP (!) 196/100 (BP Location: Left Arm)   Temp 98 F (36.7 C) (Oral)   Ht _0  (1.6 m)   Wt 128 lb (58.1 kg)   BMI 22.67 kg/m       Objective:   Physical Exam  Constitutional: She is oriented to person, place, and time. She appears well-developed and well-nourished. No distress.  Eyes: Pupils are equal, round, and reactive to light. Conjunctivae and EOM are normal. Right eye exhibits no discharge. Left eye exhibits no discharge. No scleral icterus.  Cardiovascular: Normal rate, regular rhythm, normal heart sounds and intact distal pulses.  Exam reveals no gallop.   No murmur heard. Pulmonary/Chest: Effort normal and breath sounds normal. No respiratory distress. She has no wheezes. She has no rales. She exhibits no tenderness.  Musculoskeletal: Normal range of motion. She exhibits no edema, tenderness or deformity.  Neurological: She is alert and oriented to person, place, and time.  Skin: Skin is warm and dry. No rash noted. She is not diaphoretic. No erythema. No pallor.  Psychiatric: She has a normal mood and affect. Her behavior is normal. Judgment and thought content normal.  Nursing note and vitals reviewed.     Assessment & Plan:  1. Essential hypertension  - CBC with Differential/Platelet - BMP with eGFR - cloNIDine (CATAPRES) tablet 0.1 mg; Take 1 tablet (0.1 mg total) by mouth once. - amLODipine (NORVASC) 5 MG tablet; Take 1 tablet (5 mg total) by mouth daily.  Dispense: 30 tablet; Refill: 1 - Blood pressure after one hour was 180/80 - she was asymptomatic. Advised to go the pharmacy and take medication when she gets it tonight.  - Follow up with Dr. Elease Hashimoto early next week   2. Urinary incontinence, unspecified type  - POCT Urinalysis Dipstick  (Automated)- negative   Dorothyann Peng, NP   Dorothyann Peng, NP

## 2016-12-30 NOTE — Addendum Note (Signed)
Addended by: Honor Loh L on: 12/30/2016 05:00 PM   Modules accepted: Orders

## 2016-12-31 LAB — BASIC METABOLIC PANEL WITH GFR
BUN: 11 mg/dL (ref 7–25)
CALCIUM: 9.4 mg/dL (ref 8.6–10.4)
CHLORIDE: 97 mmol/L — AB (ref 98–110)
CO2: 18 mmol/L — ABNORMAL LOW (ref 20–31)
CREATININE: 0.8 mg/dL (ref 0.60–0.88)
GFR, EST AFRICAN AMERICAN: 78 mL/min (ref >=60)
GFR, Est Non African American: 68 mL/min (ref >=60)
Glucose, Bld: 95 mg/dL (ref 65–99)
Potassium: 4.3 mmol/L (ref 3.5–5.3)
Sodium: 130 mmol/L — ABNORMAL LOW (ref 135–146)

## 2017-01-02 ENCOUNTER — Ambulatory Visit (INDEPENDENT_AMBULATORY_CARE_PROVIDER_SITE_OTHER): Payer: Medicare Other | Admitting: Family Medicine

## 2017-01-02 ENCOUNTER — Encounter: Payer: Self-pay | Admitting: Family Medicine

## 2017-01-02 ENCOUNTER — Telehealth: Payer: Self-pay | Admitting: *Deleted

## 2017-01-02 VITALS — BP 160/70 | HR 84 | Temp 97.6°F | Wt 127.9 lb

## 2017-01-02 DIAGNOSIS — I10 Essential (primary) hypertension: Secondary | ICD-10-CM | POA: Diagnosis not present

## 2017-01-02 NOTE — Telephone Encounter (Signed)
Noted  

## 2017-01-02 NOTE — Patient Instructions (Signed)

## 2017-01-02 NOTE — Telephone Encounter (Signed)
Patient has an appt today at 2:30pm.    PLEASE NOTE: All timestamps contained within this report are represented as [place] Standard Time. CONFIDENTIALTY NOTICE: This fax transmission is intended only for the addressee. It contains information that is legally privileged, confidential or otherwise protected from use or disclosure. If you are not the intended recipient, you are strictly prohibited from reviewing, disclosing, copying using or disseminating any of this information or taking any action in reliance on or regarding this information. If you have received this fax in error, please notify us immediately by telephone so that we can arrange for its return to [place]. Phone: [phone], Toll-Free: [phone], Fax: [phone] Page: 1 of 2 Call Id: [id number] [facility] Night - Client [place] Patient Name: Sara Medina Gender: Female DOB: 07-22-1929 Age: 8 Y 11 M 9 D Return Phone Number: [phone] (Primary), [phone] (Secondary) City/State/Zip: [place]  [address] Client [facility] Night - Client Client Site [facility] - Night Physician Carolann Littler - MD Who Is Calling Patient / Member / Family / Caregiver Call Type Triage / Clinical Caller Name Izora Gala Relationship To Patient Daughter Return Phone Number [phone] (Secondary) Chief Complaint Blood Pressure High Reason for Call Symptomatic / Request for Health Information Initial Comment Caller's mother has high blood pressure. States it is currently 174/91. Nurse Assessment Nurse: Myles Gip, RN, [facility] Date/Time Eilene Ghazi Time): 12/31/2016 3:14:58 PM Confirm and document reason for call. If symptomatic, describe symptoms. ---Caller states blood pressure was elevated yesterday and was seen by her MD yesterday. She was given medication (clonidine) in office. has taken several BP reading throughout today with the  highest reading of 174/91. Current BP is 146/84. Prescribed Amlodipine 5mg  1 QD took last dose at 1245 today. Denies any symptoms. Does the PT have any chronic conditions? (i.e. diabetes, asthma, etc.) ---Yes List chronic conditions. ---asthma - exercise induced, irregular heartbeat Guidelines Guideline Title Affirmed Question High Blood Pressure [8] Systolic BP >= 638 OR Diastolic >= 80 AND [1] taking BP medications Disp. Time Eilene Ghazi Time) Disposition Final User 12/31/2016 3:56:04 PM See PCP within 2 Weeks Yes Myles Gip, RN, [facility] Advice Given Per Guideline SEE PCP WITHIN 2 WEEKS: * You need an evaluation for this ongoing problem within the next 2 weeks. Call your doctor during regular office hours and make an appointment. REASSURANCE AND EDUCATION: * Your blood pressure is elevated but you have told me that you are not having any symptoms. * You should see your doctor and have your blood pressure checked within 2 weeks. * You might need to have an adjustment in your medication(s). HYPERTENSION MEDICATIONS: * It is important to take prescribed medications as directed. * The goal of blood pressure treatment for most people with hypertension is to keep the blood pressure under 140/90. For people that are 60 years or older, your doctor may instead want to keep the blood pressure under 150/90. LIFESTYLE MODIFICATIONS - The following things can help you reduce your blood pressure: * EAT HEALTHY: Eat a diet rich in fresh fruits and vegetables, dietary fiber, non-animal protein (e.g., soy), and low-fat dairy products. Avoid foods with a high content of saturated fat or cholesterol. * DECREASE SODIUM INTAKE: Aim to eat less than 2.4 g (100 mmol) of sodium each day. Unfortunately 75% of the salt in the average person's diet is in pre-processed foods. * LIMIT ALCOHOL: Limit alcohol to 0-2 standard drinks each day. Men should have less than  14 drinks per week. Women should have less than 9 drinks  per week. A drink is 1.5 oz hard liquor (one shot or jigger; 45 ml), 5 oz wine (small glass; 150 ml), 12 oz beer (one can; 360 ml). CALL BACK PLEASE NOTE: All timestamps contained within this report are represented as [place] Standard Time. CONFIDENTIALTY NOTICE: This fax transmission is intended only for the addressee. It contains information that is legally privileged, confidential or otherwise protected from use or disclosure. If you are not the intended recipient, you are strictly prohibited from reviewing, disclosing, copying using or disseminating any of this information or taking any action in reliance on or regarding this information. If you have received this fax in error, please notify us immediately by telephone so that we can arrange for its return to [place]. Phone: [phone], Toll-Free: [phone], Fax: [phone] Page: 2 of 2 Call Id: [id number] Care Advice Given Per Guideline IF: * Weakness or numbness of the face, arm or leg on one side of the body occurs * Difficulty walking, difficulty talking, or severe headache occurs * Chest pain or difficulty breathing occurs * Your blood pressure is over 160/100 * You become worse. CARE ADVICE given per High Blood Pressure (Adult) guideline. * EXERCISE, BE MORE PHYSICALLY ACTIVE: Do at least 30 minutes of aerobic exercise (e.g., brisk walking) most days of the week. Other examples of aerobic activities cycling, jogging, and swimming. * REDUCE STRESS: Find activities that help reduce your stress. Examples might include meditation, yoga, or even a restful walk in a park.

## 2017-01-02 NOTE — Progress Notes (Signed)
Subjective:     Patient ID: Sara Medina, female   DOB: 1930/04/18, 83 y.o.   MRN: [id number]  HPI   Patient here for follow-up hypertension. She came in last Friday with blood pressure 196/100. She was given 1 Catapres 0.1 mg and follow up blood pressure 180/80. She was started on amlodipine 5 mgs daily. Tolerating without side effects. Home blood pressures have varied considerably with lows  116F systolic and some around 790. No headaches. No dizziness.  Denies any recent dietary changes. No regular alcohol use. Nonsmoker. Stays quite active. She is on low-dose metoprolol but on this really more for palpitation issues for years rather than hypertension. Never treated for hypertension previously.  Past Medical History:  Diagnosis Date  . Allergy   . Arthritis   . Asthma   . Colon polyps   . Depression   . Diverticulosis   . GERD (gastroesophageal reflux disease)   . History of diverticulitis of colon   . Hyperlipidemia   . Hypertension   . Mitral valve prolapse   . Thyroid disease    Past Surgical History:  Procedure Laterality Date  . ABDOMINAL HYSTERECTOMY    . SHOULDER SURGERY Right   . TONSILLECTOMY    . trigger thumb Right     reports that she has never smoked. She has never used smokeless tobacco. She reports that she drinks alcohol. She reports that she does not use drugs. family history includes Breast cancer in her maternal aunt; Cancer in her sister; Heart disease in her father; Ovarian cancer in her mother; Stroke in her father. Allergies  Allergen Reactions  . Peanut-Containing Drug Products   . Penicillins      Review of Systems  Constitutional: Negative for fatigue.  Eyes: Negative for visual disturbance.  Respiratory: Negative for cough, chest tightness, shortness of breath and wheezing.   Cardiovascular: Negative for chest pain, palpitations and leg swelling.  Neurological: Negative for dizziness, seizures, syncope, weakness, light-headedness and  headaches.       Objective:   Physical Exam  Constitutional: She appears well-developed and well-nourished.  Eyes: Pupils are equal, round, and reactive to light.  Neck: Neck supple. No JVD present. No thyromegaly present.  Cardiovascular: Normal rate and regular rhythm.  Exam reveals no gallop.   Pulmonary/Chest: Effort normal and breath sounds normal. No respiratory distress. She has no wheezes. She has no rales.  Musculoskeletal: She exhibits no edema.  Neurological: She is alert.       Assessment:     Hypertension. Improved but suboptimal control    Plan:     -We explained that amlodipine can take several weeks to reach full effect and continue with 5 mg daily -Continue with metoprolol -, Keep sodium intake less than 2000 mg daily -Continue regular aerobic activity with walking -Reassess in 2 weeks. If not further improved at that point consider addition of low-dose HCTZ or possibly losartan  Eulas Post MD [facility] at [facility]

## 2017-01-05 ENCOUNTER — Telehealth: Payer: Self-pay | Admitting: Family Medicine

## 2017-01-05 NOTE — Telephone Encounter (Signed)
Pt is noticing that she is gradually having problems with numbers not being able to focus on adding them.  Pt not sure if it is due to her Bp medication or what it is and would like to know what she should do.  Pts daughter Izora Gala) and pt would like to have a call back today if possible.

## 2017-01-05 NOTE — Telephone Encounter (Signed)
Spoke with pt and her daughter. Patient reports that she has noticed recent increase in her inability to add/subtract numbers and occasionally not knowing how to use the remote control to the TV. She has not had any speech difficulties, facial drooping, gait instability or other s/s consistent with acute stroke.   Spoke with Tommi Rumps and he advised pt be seen and evaluated by Dr. Elease Hashimoto. Pt scheduled for 01/06/17. Pt/daughter aware. Nothing further needed at this time.

## 2017-01-06 ENCOUNTER — Ambulatory Visit (INDEPENDENT_AMBULATORY_CARE_PROVIDER_SITE_OTHER): Payer: Medicare Other | Admitting: Family Medicine

## 2017-01-06 ENCOUNTER — Encounter: Payer: Self-pay | Admitting: Family Medicine

## 2017-01-06 VITALS — BP 146/68 | HR 82 | Temp 97.9°F | Wt 126.3 lb

## 2017-01-06 DIAGNOSIS — I10 Essential (primary) hypertension: Secondary | ICD-10-CM | POA: Diagnosis not present

## 2017-01-06 DIAGNOSIS — G3184 Mild cognitive impairment, so stated: Secondary | ICD-10-CM

## 2017-01-06 NOTE — Progress Notes (Signed)
Subjective:     Patient ID: Sara Medina, female   DOB: Feb 11, 1930, 83 y.o.   MRN: [id number]  HPI Patient seen coming by daughter with concern for possible cognitive decline. She stays very active with tai chi and plays cards. She was concerned specifically because she has some difficulties with scoring of her card game recently on a couple of occasions. She has played the same card game for years.  She's had recent issues of elevated blood pressure much improved on amlodipine. Blood pressures much improved by home readings. She still drives.  No recent headaches. No reported head injuries. She had TSH within the past year normal. Previous B12 levels normal. No alcohol use. She feels her short-term memory may be slipping somewhat. No recent mental status assessment.  CT head 2012 showed some microvascular changes, o/w normal  Past Medical History:  Diagnosis Date  . Allergy   . Arthritis   . Asthma   . Colon polyps   . Depression   . Diverticulosis   . GERD (gastroesophageal reflux disease)   . History of diverticulitis of colon   . Hyperlipidemia   . Hypertension   . Mitral valve prolapse   . Thyroid disease    Past Surgical History:  Procedure Laterality Date  . ABDOMINAL HYSTERECTOMY    . SHOULDER SURGERY Right   . TONSILLECTOMY    . trigger thumb Right     reports that she has never smoked. She has never used smokeless tobacco. She reports that she drinks alcohol. She reports that she does not use drugs. family history includes Breast cancer in her maternal aunt; Cancer in her sister; Heart disease in her father; Ovarian cancer in her mother; Stroke in her father. Allergies  Allergen Reactions  . Peanut-Containing Drug Products   . Penicillins      Review of Systems  Constitutional: Negative for fatigue.  Eyes: Negative for visual disturbance.  Respiratory: Negative for cough, chest tightness, shortness of breath and wheezing.   Cardiovascular: Negative for chest pain,  palpitations and leg swelling.  Neurological: Negative for dizziness, seizures, syncope, weakness, light-headedness and headaches.       Objective:   Physical Exam  Constitutional: She is oriented to person, place, and time. She appears well-developed and well-nourished.  Eyes: Pupils are equal, round, and reactive to light.  Neck: Neck supple. No JVD present. No thyromegaly present.  Cardiovascular: Normal rate and regular rhythm.  Exam reveals no gallop.   Pulmonary/Chest: Effort normal and breath sounds normal. No respiratory distress. She has no wheezes. She has no rales.  Musculoskeletal: She exhibits no edema.  Neurological: She is alert and oriented to person, place, and time. No cranial nerve deficit. Coordination normal.  Psychiatric:  Mini-Mental Status exam score 30/30       Assessment:     #1 hypertension improving  #2 concern for possible mild cognitive impairment. She scored 30/30 on MMSE.    Plan:     -Continue current blood pressure medication -She has follow-up in 2 weeks. Consider repeat TSH and B12 then. Also repeat basic metabolic panel with recent mild hyponatremia with sodium 130  Eulas Post MD [place] [facility]

## 2017-01-10 DIAGNOSIS — M47812 Spondylosis without myelopathy or radiculopathy, cervical region: Secondary | ICD-10-CM | POA: Diagnosis not present

## 2017-01-10 DIAGNOSIS — M5030 Other cervical disc degeneration, unspecified cervical region: Secondary | ICD-10-CM | POA: Diagnosis not present

## 2017-01-10 DIAGNOSIS — M542 Cervicalgia: Secondary | ICD-10-CM | POA: Diagnosis not present

## 2017-01-10 DIAGNOSIS — M9901 Segmental and somatic dysfunction of cervical region: Secondary | ICD-10-CM | POA: Diagnosis not present

## 2017-01-16 ENCOUNTER — Ambulatory Visit (INDEPENDENT_AMBULATORY_CARE_PROVIDER_SITE_OTHER): Payer: Medicare Other | Admitting: Family Medicine

## 2017-01-16 ENCOUNTER — Encounter: Payer: Self-pay | Admitting: Family Medicine

## 2017-01-16 VITALS — BP 150/70 | HR 65 | Temp 97.7°F | Wt 125.5 lb

## 2017-01-16 DIAGNOSIS — E871 Hypo-osmolality and hyponatremia: Secondary | ICD-10-CM | POA: Diagnosis not present

## 2017-01-16 DIAGNOSIS — G3184 Mild cognitive impairment, so stated: Secondary | ICD-10-CM

## 2017-01-16 DIAGNOSIS — E039 Hypothyroidism, unspecified: Secondary | ICD-10-CM

## 2017-01-16 DIAGNOSIS — I10 Essential (primary) hypertension: Secondary | ICD-10-CM | POA: Diagnosis not present

## 2017-01-16 DIAGNOSIS — R202 Paresthesia of skin: Secondary | ICD-10-CM | POA: Diagnosis not present

## 2017-01-16 LAB — TSH: TSH: 1.92 u[IU]/mL (ref 0.35–4.50)

## 2017-01-16 LAB — VITAMIN B12

## 2017-01-16 LAB — BASIC METABOLIC PANEL
BUN: 8 mg/dL (ref 6–23)
CALCIUM: 9.1 mg/dL (ref 8.4–10.5)
CHLORIDE: 98 meq/L (ref 96–112)
CO2: 26 mEq/L (ref 19–32)
CREATININE: 0.75 mg/dL (ref 0.40–1.20)
GFR: 78.06 mL/min (ref >60.00)
Glucose, Bld: 100 mg/dL — ABNORMAL HIGH (ref 70–99)
Potassium: 4.3 mEq/L (ref 3.5–5.1)
Sodium: 132 mEq/L — ABNORMAL LOW (ref 135–145)

## 2017-01-16 MED ORDER — ALBUTEROL SULFATE HFA 108 (90 BASE) MCG/ACT IN AERS: INHALATION_SPRAY | RESPIRATORY_TRACT | 2 refills | Status: DC

## 2017-01-16 MED ORDER — AMLODIPINE BESYLATE 5 MG PO TABS: 5.0000 mg | ORAL_TABLET | Freq: Every day | ORAL | 3 refills | Status: DC

## 2017-01-16 NOTE — Progress Notes (Signed)
Subjective:     Patient ID: Sara Medina, female   DOB: 19-Jun-1929, 83 y.o.   MRN: [id number]  HPI Patient here to follow-up hypertension and for possible mild cognitive impairment. She had some difficulties with keeping score on a card game should played for many years. We performed Mini-Mental Status exam and she scored 30/30. She has hypothyroidism and is on replacement and compliant with therapy. She does complain of some paresthesias in her lower legs. No history of diabetes. She had B12 level which was normal but this was about 5 years ago.  History of hyponatremia with prior sodium 130. Does not take any thiazides. She does limit sodium with foods. Generally feels well. Stays very active.  She does feel her memory has improved since last visit. She denied any focal neuro symptoms such as blurred vision, facial weakness, slurred speech, or any focal extremity weakness.  Hypertension with recent initiation of amlodipine 5 mg. She brings in a log of home readings these are consistently less than 818E systolic and 99B and 71I diastolic. She did not bring her cuff today. No headaches. No weakness. No dizziness.  Past Medical History:  Diagnosis Date  . Allergy   . Arthritis   . Asthma   . Colon polyps   . Depression   . Diverticulosis   . GERD (gastroesophageal reflux disease)   . History of diverticulitis of colon   . Hyperlipidemia   . Hypertension   . Mitral valve prolapse   . Thyroid disease    Past Surgical History:  Procedure Laterality Date  . ABDOMINAL HYSTERECTOMY    . SHOULDER SURGERY Right   . TONSILLECTOMY    . trigger thumb Right     reports that she has never smoked. She has never used smokeless tobacco. She reports that she drinks alcohol. She reports that she does not use drugs. family history includes Breast cancer in her maternal aunt; Cancer in her sister; Heart disease in her father; Ovarian cancer in her mother; Stroke in her father. Allergies  Allergen  Reactions  . Peanut-Containing Drug Products   . Penicillins      Review of Systems  Constitutional: Negative for fatigue and unexpected weight change.  Eyes: Negative for visual disturbance.  Respiratory: Negative for cough, chest tightness, shortness of breath and wheezing.   Cardiovascular: Negative for chest pain, palpitations and leg swelling.  Endocrine: Negative for polydipsia and polyuria.  Genitourinary: Negative for dysuria.  Neurological: Negative for dizziness, seizures, syncope, weakness, light-headedness and headaches.       Objective:   Physical Exam  Constitutional: She is oriented to person, place, and time. She appears well-developed and well-nourished.  Eyes: Pupils are equal, round, and reactive to light.  Neck: Neck supple. No JVD present. No thyromegaly present.  Cardiovascular: Normal rate and regular rhythm.  Exam reveals no gallop.   Pulmonary/Chest: Effort normal and breath sounds normal. No respiratory distress. She has no wheezes. She has no rales.  Musculoskeletal: She exhibits no edema.  Neurological: She is alert and oriented to person, place, and time. No cranial nerve deficit.  Full-strength lower extremities with symmetric reflexes       Assessment:     #1 hypertension. Possible whitecoat syndrome. Blood pressures consistently well controlled at home but slightly elevated here-but still overall improved compared with few weeks ago  #2 question of recent transient mild cognitive impairment. She feels she is back to baseline today  #3 history of mild hyponatremia  Plan:     -Check further labs with basic metabolic panel, TSH, V25 level -She is strongly advised to bring her blood pressure cuff to compare with ours at follow-up in approximately one month -Continue with regular aerobic exercise and continue amlodipine 5 mg daily  Eulas Post MD [facility] at [facility]

## 2017-01-16 NOTE — Patient Instructions (Signed)
Bring your BP cuff at follow up.

## 2017-02-13 ENCOUNTER — Encounter: Payer: Self-pay | Admitting: Family Medicine

## 2017-02-13 ENCOUNTER — Ambulatory Visit (INDEPENDENT_AMBULATORY_CARE_PROVIDER_SITE_OTHER): Payer: Medicare Other | Admitting: Family Medicine

## 2017-02-13 VITALS — BP 108/64 | HR 70 | Temp 97.9°F | Wt 127.4 lb

## 2017-02-13 DIAGNOSIS — I10 Essential (primary) hypertension: Secondary | ICD-10-CM | POA: Diagnosis not present

## 2017-02-13 DIAGNOSIS — Z23 Encounter for immunization: Secondary | ICD-10-CM

## 2017-02-13 NOTE — Progress Notes (Signed)
Subjective:     Patient ID: Sara Medina, female   DOB: 02-01-1930, 83 y.o.   MRN: [id number]  HPI Patient here for follow-up hypertension. Recent addition of amlodipine 5 mg daily. Tolerating well. Home blood pressures reviewed and very well controlled. Consistent systolic readings less than 130. Diastolics less than 80. No lightheadedness. No dizziness. No peripheral edema. She does complain of some general fatigue. Walks about 4 days per week for exercise.  Recent TSH and B12 levels normal.  Past Medical History:  Diagnosis Date  . Allergy   . Arthritis   . Asthma   . Colon polyps   . Depression   . Diverticulosis   . GERD (gastroesophageal reflux disease)   . History of diverticulitis of colon   . Hyperlipidemia   . Hypertension   . Mitral valve prolapse   . Thyroid disease    Past Surgical History:  Procedure Laterality Date  . ABDOMINAL HYSTERECTOMY    . SHOULDER SURGERY Right   . TONSILLECTOMY    . trigger thumb Right     reports that she has never smoked. She has never used smokeless tobacco. She reports that she drinks alcohol. She reports that she does not use drugs. family history includes Breast cancer in her maternal aunt; Cancer in her sister; Heart disease in her father; Ovarian cancer in her mother; Stroke in her father. Allergies  Allergen Reactions  . Peanut-Containing Drug Products   . Penicillins      Review of Systems  Constitutional: Positive for fatigue.  Eyes: Negative for visual disturbance.  Respiratory: Negative for cough, chest tightness, shortness of breath and wheezing.   Cardiovascular: Negative for chest pain, palpitations and leg swelling.  Neurological: Negative for dizziness, seizures, syncope, weakness, light-headedness and headaches.       Objective:   Physical Exam  Constitutional: She appears well-developed and well-nourished.  Eyes: Pupils are equal, round, and reactive to light.  Neck: Neck supple. No JVD present. No  thyromegaly present.  Cardiovascular: Normal rate and regular rhythm.  Exam reveals no gallop.   Pulmonary/Chest: Effort normal and breath sounds normal. No respiratory distress. She has no wheezes. She has no rales.  Musculoskeletal: She exhibits no edema.  Neurological: She is alert.       Assessment:     Hypertension. Improved and at goal    Plan:     -Continue amlodipine 5 mg daily -Continue regular exercise habits -Flu vaccine given -Keep schedule follow-up in December for physical  Sara Post MD [facility] at [facility]

## 2017-02-16 ENCOUNTER — Encounter: Payer: Self-pay | Admitting: Family Medicine

## 2017-03-08 ENCOUNTER — Other Ambulatory Visit: Payer: Self-pay | Admitting: Family Medicine

## 2017-05-02 ENCOUNTER — Encounter: Payer: Self-pay | Admitting: Family Medicine

## 2017-05-02 ENCOUNTER — Ambulatory Visit (INDEPENDENT_AMBULATORY_CARE_PROVIDER_SITE_OTHER): Payer: Medicare Other | Admitting: Family Medicine

## 2017-05-02 VITALS — BP 130/84 | HR 77 | Temp 97.5°F | Ht 64.0 in | Wt 128.5 lb

## 2017-05-02 DIAGNOSIS — M26621 Arthralgia of right temporomandibular joint: Secondary | ICD-10-CM | POA: Diagnosis not present

## 2017-05-02 DIAGNOSIS — R058 Other specified cough: Secondary | ICD-10-CM

## 2017-05-02 DIAGNOSIS — E039 Hypothyroidism, unspecified: Secondary | ICD-10-CM

## 2017-05-02 DIAGNOSIS — R05 Cough: Secondary | ICD-10-CM

## 2017-05-02 DIAGNOSIS — I10 Essential (primary) hypertension: Secondary | ICD-10-CM

## 2017-05-02 LAB — BASIC METABOLIC PANEL
BUN: 11 mg/dL (ref 6–23)
CO2: 26 mEq/L (ref 19–32)
Calcium: 9.6 mg/dL (ref 8.4–10.5)
Chloride: 101 mEq/L (ref 96–112)
Creatinine, Ser: 0.79 mg/dL (ref 0.40–1.20)
GFR: 73.47 mL/min (ref >60.00)
GLUCOSE: 105 mg/dL — AB (ref 70–99)
POTASSIUM: 4.3 meq/L (ref 3.5–5.1)
SODIUM: 134 meq/L — AB (ref 135–145)

## 2017-05-02 NOTE — Patient Instructions (Signed)
Set up Medicare Wellness Visit with Manuela Schwartz Let's consider follow up bone density for next year.

## 2017-05-02 NOTE — Progress Notes (Signed)
Subjective:     Patient ID: Sara Medina, female   DOB: 12-28-1929, 83 y.o.   MRN: [id number]  HPI Patient was initially scheduled for "complete physical". However, she has Medicare as her primary. We've encouraged her to set up Medicare wellness visit with our health coach. We evaluated medical concerns today as follows  Hypertension. Treated with amlodipine. She brings in a log of blood pressures and these been very well controlled by home readings. She walks for exercise. She also takes low-dose metoprolol. No dizziness. No headaches. No chest pains.  History of intermittent dry cough. Improved with albuterol. She does not take this regularly. She thinks she may have some exercise-induced asthma.  She had some transient right TMJ pain about a month ago. Painful with eating. She noticed a popping sensation. No tooth pain. Symptoms improved at this point.  History of mild hyponatremia. She does not take any thiazide or other diuretic. Does not restrict sodium.  She had questions regarding new shingles vaccine. She had initial shingles vaccine back in 2006.  Hypothyroidism on replacement. She had recent normal TSH back in August. Compliant with therapy.  Past Medical History:  Diagnosis Date  . Allergy   . Arthritis   . Asthma   . Colon polyps   . Depression   . Diverticulosis   . GERD (gastroesophageal reflux disease)   . History of diverticulitis of colon   . Hyperlipidemia   . Hypertension   . Mitral valve prolapse   . Thyroid disease    Past Surgical History:  Procedure Laterality Date  . ABDOMINAL HYSTERECTOMY    . SHOULDER SURGERY Right   . TONSILLECTOMY    . trigger thumb Right     reports that  has never smoked. she has never used smokeless tobacco. She reports that she drinks alcohol. She reports that she does not use drugs. family history includes Breast cancer in her maternal aunt; Cancer in her sister; Heart disease in her father; Ovarian cancer in her mother;  Stroke in her father. Allergies  Allergen Reactions  . Peanut-Containing Drug Products   . Penicillins      Review of Systems  Constitutional: Negative for fatigue.  Eyes: Negative for visual disturbance.  Respiratory: Negative for cough, chest tightness, shortness of breath and wheezing.   Cardiovascular: Negative for chest pain, palpitations and leg swelling.  Endocrine: Negative for polydipsia and polyuria.  Neurological: Negative for dizziness, seizures, syncope, weakness, light-headedness and headaches.       Objective:   Physical Exam  Constitutional: She appears well-developed and well-nourished.  HENT:  Right Ear: External ear normal.  Left Ear: External ear normal.  Mouth/Throat: Oropharynx is clear and moist.  Eyes: Pupils are equal, round, and reactive to light.  Neck: Neck supple. No JVD present. No thyromegaly present.  Cardiovascular: Normal rate and regular rhythm. Exam reveals no gallop.  Pulmonary/Chest: Effort normal and breath sounds normal. No respiratory distress. She has no wheezes. She has no rales.  Musculoskeletal: She exhibits no edema.  Lymphadenopathy:    She has no cervical adenopathy.  Neurological: She is alert.       Assessment:     #1 hypertension stable and well controlled  #2 history of mild hyponatremia-patient not on thiazide  #3 recent transient right TMJ pain improved  #4 hypothyroidism on replacement  #5 history of questionable mild intermittent asthma. Symptoms improved with albuterol.    Plan:     -Recommend set up Medicare annual wellness visit -Check  basic metabolic panel -Discuss new shingles vaccine with prescription given -Consider repeat DEXA scan by next year -Continue regular weightbearing exercise  Eulas Post MD [facility] at [facility]

## 2017-05-10 NOTE — Progress Notes (Addendum)
Subjective:   Sara Medina is a 83 y.o. female who presents for Medicare Annual (Subsequent) preventive examination.  The patient was seen by Dr. Elease Hashimoto 05/02/2017 and recommended her AWV.   Lives in [place]; Independent community Has her on room; in a private duplex Was a phys ed teacher Liked to play tennis Now playing cards with 2 groups at [place] Has gastroparesis Eats what does not bother her stomach Struggle with vegetables and meats Applesauce, bananas are good Admits proteins is an issue  Keep ensure and boost around the home  2 eggs for breakfast  Exercise Walks where she is living Walks 1 to 2 hours 3 times a week Pickle ball  HDL 53   BMI 22   There are no preventive care reminders to display for this patient. Colonoscopy - no longer recommended due to age Mammogram 10/2016 - every years  -0.9 - no fx.  No falls   Zoster in 01/2005 and educated regarding Shingrix      Objective:     Vitals: BP 100/60   Pulse 60   Ht 5\' 4"  (1.626 m)   Wt 129 lb (58.5 kg)   SpO2 98%   BMI 22.14 kg/m   Body mass index is 22.14 kg/m.  Advanced Directives 05/12/2017 05/13/2013  Does Patient Have a Medical Advance Directive? Yes Patient does not have advance directive  Pre-existing out of facility DNR order (yellow form or pink MOST form) - No    Tobacco Social History   Tobacco Use  Smoking Status Never Smoker  Smokeless Tobacco Never Used     Counseling given: Yes        Past Medical History:  Diagnosis Date  . Allergy   . Arthritis   . Asthma   . Colon polyps   . Depression   . Diverticulosis   . GERD (gastroesophageal reflux disease)   . History of diverticulitis of colon   . Hyperlipidemia   . Hypertension   . Mitral valve prolapse   . Thyroid disease    Past Surgical History:  Procedure Laterality Date  . ABDOMINAL HYSTERECTOMY    . SHOULDER SURGERY Right   . TONSILLECTOMY    . trigger thumb Right    Family  History  Problem Relation Age of Onset  . Ovarian cancer Mother   . Heart disease Father   . Stroke Father   . Cancer Sister        brain  . Breast cancer Maternal Aunt   . Colon cancer Neg Hx    Social History   Socioeconomic History  . Marital status: Widowed    Spouse name: Not on file  . Number of children: 3  . Years of education: Not on file  . Highest education level: Not on file  Social Needs  . Financial resource strain: Not on file  . Food insecurity - worry: Not on file  . Food insecurity - inability: Not on file  . Transportation needs - medical: Not on file  . Transportation needs - non-medical: Not on file  Occupational History  . Occupation: retired    Fish farm manager: RETIRED  Tobacco Use  . Smoking status: Never Smoker  . Smokeless tobacco: Never Used  Substance and Sexual Activity  . Alcohol use: Yes    Alcohol/week: 0.0 oz    Comment: Couple glasses of wine per week  . Drug use: No  . Sexual activity: Not Currently  Other Topics Concern  .  Not on file  Social History Narrative  . Not on file    Outpatient Encounter Medications as of 05/12/2017  Medication Sig  . albuterol (PROAIR HFA) 108 (90 Base) MCG/ACT inhaler INHALE 2 PUFFS INTO THE LUNGS EVERY 6 (SIX) HOURS AS NEEDED FOR WHEEZING OR SHORTNESS OF BREATH.  Marland Kitchen amLODipine (NORVASC) 5 MG tablet Take 1 tablet (5 mg total) by mouth daily.  Marland Kitchen aspirin 81 MG tablet Take 81 mg by mouth daily.    . Cholecalciferol (VITAMIN D-3 PO) Take 1,000 Int'l Units by mouth daily.  Marland Kitchen EPINEPHrine 0.3 mg/0.3 mL IJ SOAJ injection Inject 0.3 mLs (0.3 mg total) into the muscle once.  Marland Kitchen estradiol (ESTRACE) 0.5 MG tablet TAKE ONE TABLET BY MOUTH DAILY  . famotidine (PEPCID) 20 MG tablet Take 20 mg by mouth daily.  . folic acid (FOLVITE) 301 MCG tablet Take 400 mcg by mouth 2 (two) times daily.  Marland Kitchen levothyroxine (SYNTHROID, LEVOTHROID) 25 MCG tablet TAKE ONE TABLET BY MOUTH DAILY BEFORE BREAKFAST  . metoprolol tartrate  (LOPRESSOR) 25 MG tablet TAKE TWO TABLETS BY MOUTH EVERY MORNING AND TAKE ONE TABLET BY MOUTH IN THE EVENING  . Probiotic Product (ALIGN PO) Take by mouth.  . pyridoxine (B-6) 100 MG tablet Take 100 mg by mouth daily.  . vitamin B-12 (CYANOCOBALAMIN) 500 MCG tablet Take 1,000 mcg by mouth 2 (two) times daily.    Facility-Administered Encounter Medications as of 05/12/2017  Medication  . 0.9 %  sodium chloride infusion  . cloNIDine (CATAPRES) tablet 0.1 mg    Activities of Daily Living In your present state of health, do you have any difficulty performing the following activities: 05/12/2017  Hearing? N  Vision? N  Difficulty concentrating or making decisions? N  Walking or climbing stairs? N  Dressing or bathing? N  Doing errands, shopping? N  Preparing Food and eating ? N  Using the Toilet? N  In the past six months, have you accidently leaked urine? N  Do you have problems with loss of bowel control? Y  Managing your Medications? N  Managing your Finances? N  Housekeeping or managing your Housekeeping? N  Some recent data might be hidden    Timed Get Up and Go performed: great   Patient Care Team: Eulas Post, MD as PCP - General (Family Medicine)    Assessment:     Exercise Activities and Dietary recommendations Current Exercise Habits: Home exercise routine;Structured exercise class, Time (Minutes): 45, Frequency (Times/Week): 3, Weekly Exercise (Minutes/Week): 135, Intensity: Moderate  Goals    . Weight (lb) < 124 lb (56.2 kg)     Watches her weight but eats well at [facility] Risk  05/12/2017 08/23/2016 11/05/2015 10/02/2014 04/08/2014  Falls in the past year? Yes No No No No  Number falls in past yr: 1 - - - -  Injury with Fall? 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 She did not have "friends established" here, so she did see a counselor and it really helped her. She has adjusted well   Cognitive Function MMSE - Mini Mental State Exam 05/12/2017  Not  completed: (No Data)     Ad8 score reviewed for issues:  Issues making decisions:  Less interest in hobbies / activities:  Repeats questions, stories (family complaining):  Trouble using ordinary gadgets (microwave, computer, phone):  Forgets the month or year:   Mismanaging finances:   Remembering appts:  Daily problems with thinking and/or memory: Ad8 score is=0       Immunization History  Administered Date(s) Administered  . Influenza Split 02/18/2011, 02/27/2012  . Influenza Whole 02/23/2010  . Influenza, High Dose Seasonal PF 02/24/2014, 03/09/2015, 02/11/2016, 02/13/2017  . Influenza,inj,Quad PF,6+ Mos 02/12/2013  . Pneumococcal Polysaccharide-23 03/04/2003  . Td 03/10/2002  . Tdap 02/27/2012  . Zoster 02/02/2005   Screening Tests Health Maintenance  Topic Date Due  . PNA vac Low Risk Adult (2 of 2 - PCV13) 04/13/2025 (Originally 03/03/2004)  . TETANUS/TDAP  02/26/2022  . INFLUENZA VACCINE  Completed  . DEXA SCAN  Completed        Plan:      PCP Notes  Health Maintenance Recommended eye exam in the future Has rx for shingrix and will take it when she goes back to the grocery store with a pharmacy    Abnormal Screens  Has hearing aids   Referrals  None  Patient concerns; None   Nurse Concerns As noted   Next PCP apt [facility] was just seen     I have personally reviewed and noted the following in the patient's chart:   . Medical and social history . Use of alcohol, tobacco or illicit drugs  . Current medications and supplements . Functional ability and status . Nutritional status . Physical activity . Advanced directives . List  of other physicians . Hospitalizations, surgeries, and ER visits in previous 12 months . Vitals . Screenings to include cognitive, depression, and falls . Referrals and appointments  In addition, I have reviewed and discussed with patient certain preventive protocols, quality metrics, and best practice recommendations. A written personalized care plan for preventive services as well as general preventive health recommendations were provided to patient.     BVQXI,HWTUU, RN  05/12/2017  Agree with assessment as above.  Eulas Post MD [facility]

## 2017-05-12 ENCOUNTER — Ambulatory Visit (INDEPENDENT_AMBULATORY_CARE_PROVIDER_SITE_OTHER): Payer: Medicare Other

## 2017-05-12 VITALS — BP 100/60 | HR 60 | Ht 64.0 in | Wt 129.0 lb

## 2017-05-12 DIAGNOSIS — Z0000 Encounter for general adult medical examination without abnormal findings: Secondary | ICD-10-CM

## 2017-05-12 NOTE — Patient Instructions (Addendum)
Ms. Benjamin , Thank you for taking time to come for your Medicare Wellness Visit. I appreciate your ongoing commitment to your health goals. Please review the following plan we discussed and let me know if I can assist you in the future.   Shingrix is a vaccine for the prevention of Shingles in Adults 50 and older.  If you are on Medicare, you can request a prescription from your doctor to be filled at a pharmacy.  Please check with your benefits regarding applicable copays or out of pocket expenses.  The Shingrix is given in 2 vaccines approx 8 weeks apart. You must receive the 2nd dose prior to 6 months from receipt of the first.   It is good to keep a doctor for you vision checks so you have one to call if you need it.   Prevention of falls: Remove rugs or any tripping hazards in the home Use Non slip mats in bathtubs and showers Placing grab bars next to the toilet and or shower Placing handrails on both sides of the stair way Adding extra lighting in the home.   Personal safety issues reviewed:  1. Consider starting a community watch program per [facility] 2.  Changes batteries is smoke detector and/or carbon monoxide detector  3.  If you have firearms; keep them in a safe place 4.  Wear protection when in the sun; Always wear sunscreen or a hat; It is good to have your doctor check your skin annually or review any new areas of concern 5. Driving safety; Keep in the right lane; stay 3 car lengths behind the car in front of you on the highway; look 3 times prior to pulling out; carry your cell phone everywhere you go!     These are the goals we discussed: Goals    None      This is a list of the screening recommended for you and due dates:  Health Maintenance  Topic Date Due  . Pneumonia vaccines (2 of 2 - PCV13) 04/13/2025*  . Tetanus Vaccine  02/26/2022  . Flu Shot  Completed  . DEXA scan (bone density measurement)  Completed  *Topic was postponed. The date  shown is not the original due date.    Fall Prevention in the Home Falls can cause injuries and can affect people from all age groups. There are many simple things that you can do to make your home safe and to help prevent falls. What can I do on the outside of my home?  Regularly repair the edges of walkways and driveways and fix any cracks.  Remove high doorway thresholds.  Trim any shrubbery on the main path into your home.  Use bright outdoor lighting.  Clear walkways of debris and clutter, including tools and rocks.  Regularly check that handrails are securely fastened and in good repair. Both sides of any steps should have handrails.  Install guardrails along the edges of any raised decks or porches.  Have leaves, snow, and ice cleared regularly.  Use sand or salt on walkways during winter months.  In the garage, clean up any spills right away, including grease or oil spills. What can I do in the bathroom?  Use night lights.  Install grab bars by the toilet and in the tub and shower. Do not use towel bars as grab bars.  Use non-skid mats or decals on the floor of the tub or shower.  If you need to sit down while you are  in the shower, use a plastic, non-slip stool.  Keep the floor dry. Immediately clean up any water that spills on the floor.  Remove soap buildup in the tub or shower on a regular basis.  Attach bath mats securely with double-sided non-slip rug tape.  Remove throw rugs and other tripping hazards from the floor. What can I do in the bedroom?  Use night lights.  Make sure that a bedside light is easy to reach.  Do not use oversized bedding that drapes onto the floor.  Have a firm chair that has side arms to use for getting dressed.  Remove throw rugs and other tripping hazards from the floor. What can I do in the kitchen?  Clean up any spills right away.  Avoid walking on wet floors.  Place frequently used items in easy-to-reach  places.  If you need to reach for something above you, use a sturdy step stool that has a grab bar.  Keep electrical cables out of the way.  Do not use floor polish or wax that makes floors slippery. If you have to use wax, make sure that it is non-skid floor wax.  Remove throw rugs and other tripping hazards from the floor. What can I do in the stairways?  Do not leave any items on the stairs.  Make sure that there are handrails on both sides of the stairs. Fix handrails that are broken or loose. Make sure that handrails are as long as the stairways.  Check any carpeting to make sure that it is firmly attached to the stairs. Fix any carpet that is loose or worn.  Avoid having throw rugs at the top or bottom of stairways, or secure the rugs with carpet tape to prevent them from moving.  Make sure that you have a light switch at the top of the stairs and the bottom of the stairs. If you do not have them, have them installed. What are some other fall prevention tips?  Wear closed-toe shoes that fit well and support your feet. Wear shoes that have rubber soles or low heels.  When you use a stepladder, make sure that it is completely opened and that the sides are firmly locked. Have someone hold the ladder while you are using it. Do not climb a closed stepladder.  Add color or contrast paint or tape to grab bars and handrails in your home. Place contrasting color strips on the first and last steps.  Use mobility aids as needed, such as canes, walkers, scooters, and crutches.  Turn on lights if it is dark. Replace any light bulbs that burn out.  Set up furniture so that there are clear paths. Keep the furniture in the same spot.  Fix any uneven floor surfaces.  Choose a carpet design that does not hide the edge of steps of a stairway.  Be aware of any and all pets.  Review your medicines with your healthcare provider. Some medicines can cause dizziness or changes in blood pressure,  which increase your risk of falling. Talk with your health care provider about other ways that you can decrease your risk of falls. This may include working with a physical therapist or trainer to improve your strength, balance, and endurance. This information is not intended to replace advice given to you by your health care provider. Make sure you discuss any questions you have with your health care provider. Document Released: 05/06/2002 Document Revised: 10/13/2015 Document Reviewed: 06/20/2014 Elsevier Interactive Patient Education  2017 Elsevier  [place] Maintenance for Postmenopausal Women Menopause is a normal process in which your reproductive ability comes to an end. This process happens gradually over a span of months to years, usually between the ages of 64 and 63. Menopause is complete when you have missed 12 consecutive menstrual periods. It is important to talk with your health care provider about some of the most common conditions that affect postmenopausal women, such as heart disease, cancer, and bone loss (osteoporosis). Adopting a healthy lifestyle and getting preventive care can help to promote your health and wellness. Those actions can also lower your chances of developing some of these common conditions. What should I know about menopause? During menopause, you may experience a number of symptoms, such as:  Moderate-to-severe hot flashes.  Night sweats.  Decrease in sex drive.  Mood swings.  Headaches.  Tiredness.  Irritability.  Memory problems.  Insomnia.  Choosing to treat or not to treat menopausal changes is an individual decision that you make with your health care provider. What should I know about hormone replacement therapy and supplements? Hormone therapy products are effective for treating symptoms that are associated with menopause, such as hot flashes and night sweats. Hormone replacement carries certain risks, especially as you become older.  If you are thinking about using estrogen or estrogen with progestin treatments, discuss the benefits and risks with your health care provider. What should I know about heart disease and stroke? Heart disease, heart attack, and stroke become more likely as you age. This may be due, in part, to the hormonal changes that your body experiences during menopause. These can affect how your body processes dietary fats, triglycerides, and cholesterol. Heart attack and stroke are both medical emergencies. There are many things that you can do to help prevent heart disease and stroke:  Have your blood pressure checked at least every 1-2 years. High blood pressure causes heart disease and increases the risk of stroke.  If you are 84-74 years old, ask your health care provider if you should take aspirin to prevent a heart attack or a stroke.  Do not use any tobacco products, including cigarettes, chewing tobacco, or electronic cigarettes. If you need help quitting, ask your health care provider.  It is important to eat a healthy diet and maintain a healthy weight. ? Be sure to include plenty of vegetables, fruits, low-fat dairy products, and lean protein. ? Avoid eating foods that are high in solid fats, added sugars, or salt (sodium).  Get regular exercise. This is one of the most important things that you can do for your health. ? Try to exercise for at least 150 minutes each week. The type of exercise that you do should increase your heart rate and make you sweat. This is known as moderate-intensity exercise. ? Try to do strengthening exercises at least twice each week. Do these in addition to the moderate-intensity exercise.  Know your numbers.Ask your health care provider to check your cholesterol and your blood glucose. Continue to have your blood tested as directed by your health care provider.  What should I know about cancer screening? There are several types of cancer. Take the following steps to  reduce your risk and to catch any cancer development as early as possible. Breast Cancer  Practice breast self-awareness. ? This means understanding how your breasts normally appear and feel. ? It also means doing regular breast self-exams. Let your health care provider know about any changes, no matter how small.  If  you are 64 or older, have a clinician do a breast exam (clinical breast exam or CBE) every year. Depending on your age, family history, and medical history, it may be recommended that you also have a yearly breast X-ray (mammogram).  If you have a family history of breast cancer, talk with your health care provider about genetic screening.  If you are at high risk for breast cancer, talk with your health care provider about having an MRI and a mammogram every year.  Breast cancer (BRCA) gene test is recommended for women who have family members with BRCA-related cancers. Results of the assessment will determine the need for genetic counseling and BRCA1 and for BRCA2 testing. BRCA-related cancers include these types: ? Breast. This occurs in males or females. ? Ovarian. ? Tubal. This may also be called fallopian tube cancer. ? Cancer of the abdominal or pelvic lining (peritoneal cancer). ? Prostate. ? Pancreatic.  Cervical, Uterine, and Ovarian Cancer Your health care provider may recommend that you be screened regularly for cancer of the pelvic organs. These include your ovaries, uterus, and vagina. This screening involves a pelvic exam, which includes checking for microscopic changes to the surface of your cervix (Pap test).  For women ages 21-65, health care providers may recommend a pelvic exam and a Pap test every three years. For women ages 55-65, they may recommend the Pap test and pelvic exam, combined with testing for human papilloma virus (HPV), every five years. Some types of HPV increase your risk of cervical cancer. Testing for HPV may also be done on women of any  age who have unclear Pap test results.  Other health care providers may not recommend any screening for nonpregnant women who are considered low risk for pelvic cancer and have no symptoms. Ask your health care provider if a screening pelvic exam is right for you.  If you have had past treatment for cervical cancer or a condition that could lead to cancer, you need Pap tests and screening for cancer for at least 20 years after your treatment. If Pap tests have been discontinued for you, your risk factors (such as having a new sexual partner) need to be reassessed to determine if you should start having screenings again. Some women have medical problems that increase the chance of getting cervical cancer. In these cases, your health care provider may recommend that you have screening and Pap tests more often.  If you have a family history of uterine cancer or ovarian cancer, talk with your health care provider about genetic screening.  If you have vaginal bleeding after reaching menopause, tell your health care provider.  There are currently no reliable tests available to screen for ovarian cancer.  Lung Cancer Lung cancer screening is recommended for adults 68-72 years old who are at high risk for lung cancer because of a history of smoking. A yearly low-dose CT scan of the lungs is recommended if you:  Currently smoke.  Have a history of at least 30 pack-years of smoking and you currently smoke or have quit within the past 15 years. A pack-year is smoking an average of one pack of cigarettes per day for one year.  Yearly screening should:  Continue until it has been 15 years since you quit.  Stop if you develop a health problem that would prevent you from having lung cancer treatment.  Colorectal Cancer  This type of cancer can be detected and can often be prevented.  Routine colorectal cancer screening usually  begins at age 63 and continues through age 42.  If you have risk factors  for colon cancer, your health care provider may recommend that you be screened at an earlier age.  If you have a family history of colorectal cancer, talk with your health care provider about genetic screening.  Your health care provider may also recommend using home test kits to check for hidden blood in your stool.  A small camera at the end of a tube can be used to examine your colon directly (sigmoidoscopy or colonoscopy). This is done to check for the earliest forms of colorectal cancer.  Direct examination of the colon should be repeated every 5-10 years until age 14. However, if early forms of precancerous polyps or small growths are found or if you have a family history or genetic risk for colorectal cancer, you may need to be screened more often.  Skin Cancer  Check your skin from head to toe regularly.  Monitor any moles. Be sure to tell your health care provider: ? About any new moles or changes in moles, especially if there is a change in a mole's shape or color. ? If you have a mole that is larger than the size of a pencil eraser.  If any of your family members has a history of skin cancer, especially at a young age, talk with your health care provider about genetic screening.  Always use sunscreen. Apply sunscreen liberally and repeatedly throughout the day.  Whenever you are outside, protect yourself by wearing long sleeves, pants, a wide-brimmed hat, and sunglasses.  What should I know about osteoporosis? Osteoporosis is a condition in which bone destruction happens more quickly than new bone creation. After menopause, you may be at an increased risk for osteoporosis. To help prevent osteoporosis or the bone fractures that can happen because of osteoporosis, the following is recommended:  If you are 77-83 years old, get at least 1,000 mg of calcium and at least 600 mg of vitamin D per day.  If you are older than age 48 but younger than age 17, get at least 1,200 mg of  calcium and at least 600 mg of vitamin D per day.  If you are older than age 33, get at least 1,200 mg of calcium and at least 800 mg of vitamin D per day.  Smoking and excessive alcohol intake increase the risk of osteoporosis. Eat foods that are rich in calcium and vitamin D, and do weight-bearing exercises several times each week as directed by your health care provider. What should I know about how menopause affects my mental health? Depression may occur at any age, but it is more common as you become older. Common symptoms of depression include:  Low or sad mood.  Changes in sleep patterns.  Changes in appetite or eating patterns.  Feeling an overall lack of motivation or enjoyment of activities that you previously enjoyed.  Frequent crying spells.  Talk with your health care provider if you think that you are experiencing depression. What should I know about immunizations? It is important that you get and maintain your immunizations. These include:  Tetanus, diphtheria, and pertussis (Tdap) booster vaccine.  Influenza every year before the flu season begins.  Pneumonia vaccine.  Shingles vaccine.  Your health care provider may also recommend other immunizations. This information is not intended to replace advice given to you by your health care provider. Make sure you discuss any questions you have with your health care provider. Document  Released: 07/08/2005 Document Revised: 12/04/2015 Document Reviewed: 02/17/2015 Elsevier Interactive Patient Education  [employer].

## 2017-06-13 ENCOUNTER — Other Ambulatory Visit: Payer: Self-pay | Admitting: Family Medicine

## 2017-07-18 ENCOUNTER — Encounter: Payer: Self-pay | Admitting: Family Medicine

## 2017-08-20 ENCOUNTER — Other Ambulatory Visit: Payer: Self-pay | Admitting: Family Medicine

## 2017-08-21 NOTE — Telephone Encounter (Signed)
Refill for 6 months. 

## 2017-09-12 ENCOUNTER — Other Ambulatory Visit: Payer: Self-pay | Admitting: Family Medicine

## 2017-11-13 ENCOUNTER — Other Ambulatory Visit: Payer: Self-pay | Admitting: Family Medicine

## 2017-11-13 DIAGNOSIS — Z1231 Encounter for screening mammogram for malignant neoplasm of breast: Secondary | ICD-10-CM

## 2017-11-20 ENCOUNTER — Ambulatory Visit
Admission: RE | Admit: 2017-11-20 | Discharge: 2017-11-20 | Disposition: A | Discharge status: Home / Self Care | Payer: Medicare Other | Source: Ambulatory Visit | Attending: Family Medicine | Admitting: Family Medicine

## 2017-11-20 DIAGNOSIS — Z1231 Encounter for screening mammogram for malignant neoplasm of breast: Secondary | ICD-10-CM | POA: Diagnosis not present

## 2017-12-11 ENCOUNTER — Other Ambulatory Visit: Payer: Self-pay | Admitting: Family Medicine

## 2017-12-21 ENCOUNTER — Telehealth: Payer: Self-pay | Admitting: Cardiology

## 2017-12-21 NOTE — Telephone Encounter (Signed)
Spoke with pt dtr, the patient is having mild symptoms. She usually can walk 8000 steps without a problem but occ she will get SOB walking in the home for no real reason. She also reports occ while resting the patient will get a tingling in both arms or the left arm. The patient was last seen in 22016 and just wants to get a check up. They are fine with the follow up they have in September and voiced understanding to call prior to appt if symptoms worsen or they are concerned.

## 2017-12-21 NOTE — Telephone Encounter (Signed)
New Message:     Pt have been having some symptoms that daughter is concerned about. All symptoms are off and on, she wanted pt seen. first available is in September.

## 2018-01-22 ENCOUNTER — Other Ambulatory Visit: Payer: Self-pay | Admitting: Adult Health

## 2018-01-22 ENCOUNTER — Other Ambulatory Visit: Payer: Self-pay | Admitting: Family Medicine

## 2018-01-22 DIAGNOSIS — I10 Essential (primary) hypertension: Secondary | ICD-10-CM

## 2018-01-24 ENCOUNTER — Other Ambulatory Visit: Payer: Self-pay | Admitting: Adult Health

## 2018-01-24 DIAGNOSIS — I10 Essential (primary) hypertension: Secondary | ICD-10-CM

## 2018-02-05 ENCOUNTER — Encounter

## 2018-02-05 ENCOUNTER — Ambulatory Visit: Payer: Medicare Other | Admitting: Cardiology

## 2018-02-06 DIAGNOSIS — I10 Essential (primary) hypertension: Secondary | ICD-10-CM | POA: Diagnosis not present

## 2018-02-06 DIAGNOSIS — Z23 Encounter for immunization: Secondary | ICD-10-CM | POA: Diagnosis not present

## 2018-02-06 DIAGNOSIS — Z1283 Encounter for screening for malignant neoplasm of skin: Secondary | ICD-10-CM | POA: Diagnosis not present

## 2018-02-16 DIAGNOSIS — Z961 Presence of intraocular lens: Secondary | ICD-10-CM | POA: Diagnosis not present

## 2018-02-16 DIAGNOSIS — H43391 Other vitreous opacities, right eye: Secondary | ICD-10-CM | POA: Diagnosis not present

## 2018-02-26 ENCOUNTER — Other Ambulatory Visit: Payer: Self-pay | Admitting: Family Medicine

## 2018-03-05 ENCOUNTER — Other Ambulatory Visit: Payer: Self-pay | Admitting: Family Medicine

## 2018-04-27 ENCOUNTER — Other Ambulatory Visit: Payer: Self-pay | Admitting: Family Medicine

## 2018-04-27 DIAGNOSIS — I10 Essential (primary) hypertension: Secondary | ICD-10-CM

## 2018-05-02 ENCOUNTER — Telehealth: Payer: Self-pay

## 2018-05-02 NOTE — Telephone Encounter (Signed)
Call to the patient to reschedule AWV Stated she will fup with Dr. Elease Hashimoto as she needs a CPE   Wynetta Fines RN

## 2018-05-09 ENCOUNTER — Telehealth: Payer: Self-pay | Admitting: *Deleted

## 2018-05-09 NOTE — Telephone Encounter (Signed)
I called [facility] at [phone] as a prior auth could not be done electronically initially and spoke with [facility]. Prior auth sent to [facility].  Note in [email or web address] stated the patient currently has access to the requested medication and a Prior Authorization is not needed for the patient/medication.  Sara Medina was informed of this, looked in the system and stated the Rx is going through with NDC# [id number]. I called Sara Medina and left a detailed message on the voicemail with this info.

## 2018-05-14 ENCOUNTER — Ambulatory Visit: Payer: Medicare Other

## 2018-06-04 ENCOUNTER — Other Ambulatory Visit: Payer: Self-pay | Admitting: Family Medicine

## 2018-06-17 ENCOUNTER — Other Ambulatory Visit: Payer: Self-pay | Admitting: Family Medicine

## 2018-06-22 ENCOUNTER — Ambulatory Visit: Payer: Medicare Other | Admitting: Family Medicine

## 2018-06-22 ENCOUNTER — Ambulatory Visit: Payer: Medicare Other

## 2018-06-25 ENCOUNTER — Ambulatory Visit (INDEPENDENT_AMBULATORY_CARE_PROVIDER_SITE_OTHER): Payer: Medicare Other | Admitting: Family Medicine

## 2018-06-25 ENCOUNTER — Encounter: Payer: Self-pay | Admitting: Family Medicine

## 2018-06-25 ENCOUNTER — Ambulatory Visit: Payer: Medicare Other

## 2018-06-25 ENCOUNTER — Other Ambulatory Visit: Payer: Self-pay

## 2018-06-25 VITALS — BP 136/76 | HR 66 | Temp 97.9°F | Ht 62.0 in | Wt 124.1 lb

## 2018-06-25 DIAGNOSIS — E039 Hypothyroidism, unspecified: Secondary | ICD-10-CM | POA: Diagnosis not present

## 2018-06-25 DIAGNOSIS — Z780 Asymptomatic menopausal state: Secondary | ICD-10-CM | POA: Diagnosis not present

## 2018-06-25 DIAGNOSIS — E871 Hypo-osmolality and hyponatremia: Secondary | ICD-10-CM

## 2018-06-25 DIAGNOSIS — I10 Essential (primary) hypertension: Secondary | ICD-10-CM | POA: Diagnosis not present

## 2018-06-25 DIAGNOSIS — Z0000 Encounter for general adult medical examination without abnormal findings: Secondary | ICD-10-CM | POA: Diagnosis not present

## 2018-06-25 LAB — BASIC METABOLIC PANEL
BUN: 17 mg/dL (ref 6–23)
CALCIUM: 9.3 mg/dL (ref 8.4–10.5)
CO2: 25 mEq/L (ref 19–32)
CREATININE: 0.86 mg/dL (ref 0.40–1.20)
Chloride: 102 mEq/L (ref 96–112)
GFR: 62.5 mL/min (ref >60.00)
GLUCOSE: 100 mg/dL — AB (ref 70–99)
Potassium: 4.3 mEq/L (ref 3.5–5.1)
Sodium: 136 mEq/L (ref 135–145)

## 2018-06-25 LAB — TSH: TSH: 2.23 u[IU]/mL (ref 0.35–4.50)

## 2018-06-25 MED ORDER — METOPROLOL TARTRATE 25 MG PO TABS: ORAL_TABLET | ORAL | 3 refills | Status: DC

## 2018-06-25 MED ORDER — LEVOTHYROXINE SODIUM 25 MCG PO TABS: ORAL_TABLET | ORAL | 3 refills | Status: DC

## 2018-06-25 MED ORDER — AMLODIPINE BESYLATE 5 MG PO TABS: 5.0000 mg | ORAL_TABLET | Freq: Every day | ORAL | 3 refills | Status: DC

## 2018-06-25 NOTE — Patient Instructions (Addendum)
Preventive Care 83 Years and Older, Female Preventive care refers to lifestyle choices and visits with your health care provider that can promote health and wellness. What does preventive care include?  A yearly physical exam. This is also called an annual well check.  Dental exams once or twice a year.  Routine eye exams. Ask your health care provider how often you should have your eyes checked.  Personal lifestyle choices, including: ? Daily care of your teeth and gums. ? Regular physical activity. ? Eating a healthy diet. ? Avoiding tobacco and drug use. ? Limiting alcohol use. ? Practicing safe sex. ? Taking low-dose aspirin every day. ? Taking vitamin and mineral supplements as recommended by your health care provider. What happens during an annual well check? The services and screenings done by your health care provider during your annual well check will depend on your age, overall health, lifestyle risk factors, and family history of disease. Counseling Your health care provider may ask you questions about your:  Alcohol use.  Tobacco use.  Drug use.  Emotional well-being.  Home and relationship well-being.  Sexual activity.  Eating habits.  History of falls.  Memory and ability to understand (cognition).  Work and work Statistician.  Reproductive health.  Screening You may have the following tests or measurements:  Height, weight, and BMI.  Blood pressure.  Lipid and cholesterol levels. These may be checked every 5 years, or more frequently if you are over 30 years old.  Skin check.  Lung cancer screening. You may have this screening every year starting at age 27 if you have a 30-pack-year history of smoking and currently smoke or have quit within the past 15 years.  Colorectal cancer screening. All adults should have this screening starting at age 33 and continuing until age 46. You will have tests every 1-10 years, depending on your results and the  type of screening test. People at increased risk should start screening at an earlier age. Screening tests may include: ? Guaiac-based fecal occult blood testing. ? Fecal immunochemical test (FIT). ? Stool DNA test. ? Virtual colonoscopy. ? Sigmoidoscopy. During this test, a flexible tube with a tiny camera (sigmoidoscope) is used to examine your rectum and lower colon. The sigmoidoscope is inserted through your anus into your rectum and lower colon. ? Colonoscopy. During this test, a long, thin, flexible tube with a tiny camera (colonoscope) is used to examine your entire colon and rectum.  Hepatitis C blood test.  Hepatitis B blood test.  Sexually transmitted disease (STD) testing.  Diabetes screening. This is done by checking your blood sugar (glucose) after you have not eaten for a while (fasting). You may have this done every 1-3 years.  Bone density scan. This is done to screen for osteoporosis. You may have this done starting at age 37.  Mammogram. This may be done every 1-2 years. Talk to your health care provider about how often you should have regular mammograms. Talk with your health care provider about your test results, treatment options, and if necessary, the need for more tests. Vaccines Your health care provider may recommend certain vaccines, such as:  Influenza vaccine. This is recommended every year.  Tetanus, diphtheria, and acellular pertussis (Tdap, Td) vaccine. You may need a Td booster every 10 years.  Varicella vaccine. You may need this if you have not been vaccinated.  Zoster vaccine. You may need this after age 38.  Measles, mumps, and rubella (MMR) vaccine. You may need at least  one dose of MMR if you were born in 1957 or later. You may also need a second dose.  Pneumococcal 13-valent conjugate (PCV13) vaccine. One dose is recommended after age 22.  Pneumococcal polysaccharide (PPSV23) vaccine. One dose is recommended after age 62.  Meningococcal  vaccine. You may need this if you have certain conditions.  Hepatitis A vaccine. You may need this if you have certain conditions or if you travel or work in places where you may be exposed to hepatitis A.  Hepatitis B vaccine. You may need this if you have certain conditions or if you travel or work in places where you may be exposed to hepatitis B.  Haemophilus influenzae type b (Hib) vaccine. You may need this if you have certain conditions. Talk to your health care provider about which screenings and vaccines you need and how often you need them. This information is not intended to replace advice given to you by your health care provider. Make sure you discuss any questions you have with your health care provider. Document Released: 06/12/2015 Document Revised: 07/06/2017 Document Reviewed: 03/17/2015 Elsevier Interactive Patient Education  2019 [employer].  Set up [employer] at [place].

## 2018-06-25 NOTE — Progress Notes (Signed)
Subjective:     Patient ID: Sara Medina, female   DOB: 12-15-1929, 85 y.o.   MRN: [id number]  HPI Patient is seen for Medicare wellness visit and medical follow-up.  Her chronic problems include history of hypertension, hypothyroidism, mild hyperlipidemia, GERD.  Medications reviewed.  She is still on low-dose Estrace.  She states she was placed on this years ago after she had hysterectomy.  She has not had a bone density scan since 2016.  Her immunizations are up-to-date.  Needs refills of all medications.  Her medications are reviewed.  Compliant with all.   No recent falls.  Mood stable. Hypothyroidism on replacement. No overt symptoms of hypothyroidism.    Past Medical History:  Diagnosis Date  . Allergy   . Arthritis   . Asthma   . Colon polyps   . Depression   . Diverticulosis   . GERD (gastroesophageal reflux disease)   . History of diverticulitis of colon   . Hyperlipidemia   . Hypertension   . Mitral valve prolapse   . Thyroid disease    Past Surgical History:  Procedure Laterality Date  . ABDOMINAL HYSTERECTOMY    . SHOULDER SURGERY Right   . TONSILLECTOMY    . trigger thumb Right     reports that she has never smoked. She has never used smokeless tobacco. She reports current alcohol use. She reports that she does not use drugs. family history includes Breast cancer in her maternal aunt; Cancer in her sister; Heart disease in her father; Ovarian cancer in her mother; Stroke in her father. Allergies  Allergen Reactions  . Peanut-Containing Drug Products   . Penicillins    1.  Risk factors based on Past Medical , Social, and Family history reviewed and as indicated above with no changes 2.  Limitations in physical activities None.  No recent falls. 3.  Depression/mood No active depression or anxiety issues 4.  Hearing -bilateral hearing aids. 5.  ADLs independent in all. 6.  Cognitive function (orientation to time and place, language, writing, speech,memory) no  short or long term memory issues.  Language and judgement intact. 7.  Home Safety no issues 8.  Height, weight, and visual acuity.all stable. 9.  Counseling discussed fall prevention. 10. Recommendation of preventive services set up repeat DEXA.  Continue with yearly flu vaccine. 11. Labs based on risk factors- TSH and BMP 12. Care Plan= as above. 13. Other Providers- none. 14. Written schedule of screening/prevention services given to patient. 15.  Advanced directives-patient does not have DNR but otherwise her advanced directives are all up-to-date  Review of Systems  Constitutional: Negative for activity change, appetite change, fatigue, fever and unexpected weight change.  HENT: Negative for ear pain, hearing loss, sore throat and trouble swallowing.   Eyes: Negative for visual disturbance.  Respiratory: Positive for cough. Negative for shortness of breath.   Cardiovascular: Negative for chest pain and palpitations.  Gastrointestinal: Negative for abdominal pain, blood in stool, constipation and diarrhea.  Genitourinary: Negative for dysuria and hematuria.  Musculoskeletal: Negative for arthralgias, back pain and myalgias.  Skin: Negative for rash.  Neurological: Negative for dizziness, syncope and headaches.  Hematological: Negative for adenopathy.  Psychiatric/Behavioral: Negative for confusion and dysphoric mood.       Objective:   Physical Exam Constitutional:      Appearance: She is well-developed.  HENT:     Head: Normocephalic and atraumatic.  Eyes:     Pupils: Pupils are equal, round, and reactive to light.  Neck:     Musculoskeletal: Normal range of motion and neck supple.     Thyroid: No thyromegaly.  Cardiovascular:     Rate and Rhythm: Normal rate and regular rhythm.     Heart sounds: Normal heart sounds. No murmur.  Pulmonary:     Effort: No respiratory distress.     Breath sounds: Normal breath sounds. No wheezing or rales.  Abdominal:     General: Bowel  sounds are normal. There is no distension.     Palpations: Abdomen is soft. There is no mass.     Tenderness: There is no abdominal tenderness. There is no guarding or rebound.  Musculoskeletal: Normal range of motion.  Lymphadenopathy:     Cervical: No cervical adenopathy.  Skin:    Findings: No rash.  Neurological:     Mental Status: She is alert and oriented to person, place, and time.     Cranial Nerves: No cranial nerve deficit.     Deep Tendon Reflexes: Reflexes normal.  Psychiatric:        Behavior: Behavior normal.        Thought Content: Thought content normal.        Judgment: Judgment normal.        Assessment:     #1 Medicare wellness visit-generally healthy 85 year old female who stays quite active and walks regularly for exercise.  #2 hypertension stable and at goal  #3 hypothyroidism -on replacement  #4 history of mild hyponatremia.  No thiazide use    Plan:     -Check labs with TSH and basic metabolic panel -Set up repeat bone density scan -We discussed pros and cons of estrogen therapy.  We did not see any compelling reason to keep her on oral estrogen at this point and we explained there is some increased risk of pro-coagulation as well as stroke with maintenance of estrogen at this age.  She would discontinue Estrace and let us know she has any side effects or symptoms after discontinuation -Refilled all medications for 1 year -Discussed advanced directives.  Patient requesting DNR form and this was completed  Eulas Post MD [facility]

## 2018-06-29 ENCOUNTER — Ambulatory Visit (INDEPENDENT_AMBULATORY_CARE_PROVIDER_SITE_OTHER)
Admission: RE | Admit: 2018-06-29 | Discharge: 2018-06-29 | Disposition: A | Discharge status: Home / Self Care | Payer: Medicare Other | Source: Ambulatory Visit

## 2018-06-29 DIAGNOSIS — Z780 Asymptomatic menopausal state: Secondary | ICD-10-CM

## 2018-09-01 ENCOUNTER — Other Ambulatory Visit: Payer: Self-pay | Admitting: Family Medicine

## 2018-09-13 ENCOUNTER — Telehealth: Payer: Self-pay | Admitting: Family Medicine

## 2018-09-13 NOTE — Telephone Encounter (Signed)
Patients daughter called because they patients estradiol was denied. Her mother stated that when she discussed with the provider trying to be off of the medication he told her if she needed to go back on this he would refill it. Since then she is having trouble sleeping and other symptoms that the daughter states she can not remember at the moment. She would like this sent to the pharmacy and if any issues to contact the daughter Izora Gala at [phone]

## 2018-09-14 ENCOUNTER — Ambulatory Visit (INDEPENDENT_AMBULATORY_CARE_PROVIDER_SITE_OTHER): Payer: Medicare Other | Admitting: Family Medicine

## 2018-09-14 ENCOUNTER — Other Ambulatory Visit: Payer: Self-pay

## 2018-09-14 DIAGNOSIS — M1991 Primary osteoarthritis, unspecified site: Secondary | ICD-10-CM | POA: Diagnosis not present

## 2018-09-14 DIAGNOSIS — N959 Unspecified menopausal and perimenopausal disorder: Secondary | ICD-10-CM | POA: Diagnosis not present

## 2018-09-14 DIAGNOSIS — I10 Essential (primary) hypertension: Secondary | ICD-10-CM | POA: Diagnosis not present

## 2018-09-14 MED ORDER — ESTRADIOL 0.5 MG PO TABS: 0.5000 mg | ORAL_TABLET | Freq: Every day | ORAL | 3 refills | Status: DC

## 2018-09-14 NOTE — Progress Notes (Signed)
Patient ID: Sara Medina, female   DOB: [birth year], 85 y.o.   MRN: [id number]  Virtual Visit via Video Note  I connected with Sara Medina on 09/14/18 at  2:30 PM EDT by a video enabled telemedicine application and verified that I am speaking with the correct person using two identifiers.  Location patient: home Location provider:work or home office Persons participating in the virtual visit: patient, provider  I discussed the limitations of evaluation and management by telemedicine and the availability of in person appointments. The patient expressed understanding and agreed to proceed.   HPI: Patient has long history of estrogen replacement therapy.  For several years she took combination of estrogen and progesterone but then she had a hysterectomy she states about 10 years ago.  At that point she went on Estrace 0.5 mg daily.  We took her off this a few months ago but since that time she is had some difficulty sleeping.  She does have some hot flashes but more than anything feels like her sleep has been worse.  She called earlier today requesting to go back on that.  She has no history of any liver difficulties.  No history of DVT or pulmonary embolus nor any family history.  She gets regular mammograms.  No history of CAD.  Question of possible previous mild TIA.  No definite cerebrovascular disease history.  She has hypertension which has been controlled.  She has osteoarthritis and usually takes Aleve at night which seems to help.   ROS: See pertinent positives and negatives per HPI.  Past Medical History:  Diagnosis Date  . Allergy   . Arthritis   . Asthma   . Colon polyps   . Depression   . Diverticulosis   . GERD (gastroesophageal reflux disease)   . History of diverticulitis of colon   . Hyperlipidemia   . Hypertension   . Mitral valve prolapse   . Thyroid disease     Past Surgical History:  Procedure Laterality Date  . ABDOMINAL HYSTERECTOMY    . SHOULDER SURGERY Right    . TONSILLECTOMY    . trigger thumb Right     Family History  Problem Relation Age of Onset  . Ovarian cancer Mother   . Heart disease Father   . Stroke Father   . Cancer Sister        brain  . Breast cancer Maternal Aunt   . Colon cancer Neg Hx     SOCIAL HX: Lives at [facility].  Non-smoker.  Stays very active.  Usually walks about 8000 steps per day   Current Outpatient Medications:  .  albuterol (PROVENTIL HFA;VENTOLIN HFA) 108 (90 Base) MCG/ACT inhaler, INHALE 2 PUFFS INTO THE LUNGS EVERY 6 HOURS AS NEEDED FOR WHEEZING OR FOR SHORTNESS OF BREATH, Disp: 8.5 each, Rfl: 0 .  amLODipine (NORVASC) 5 MG tablet, Take 1 tablet (5 mg total) by mouth daily., Disp: 90 tablet, Rfl: 3 .  aspirin 81 MG tablet, Take 81 mg by mouth daily.  , Disp: , Rfl:  .  Cholecalciferol (VITAMIN D-3 PO), Take 1,000 Int'l Units by mouth daily., Disp: , Rfl:  .  EPINEPHrine 0.3 mg/0.3 mL IJ SOAJ injection, Inject 0.3 mLs (0.3 mg total) into the muscle once., Disp: 2 Device, Rfl: 0 .  estradiol (ESTRACE) 0.5 MG tablet, Take 1 tablet (0.5 mg total) by mouth daily., Disp: 90 tablet, Rfl: 3 .  famotidine (PEPCID) 20 MG tablet, Take 20 mg by mouth daily., Disp: ,  Rfl:  .  folic acid (FOLVITE) 409 MCG tablet, Take 400 mcg by mouth 2 (two) times daily., Disp: , Rfl:  .  levothyroxine (SYNTHROID, LEVOTHROID) 25 MCG tablet, TAKE ONE TABLET BY MOUTH DAILY BEFORE BREAKFAST, Disp: 90 tablet, Rfl: 3 .  metoprolol tartrate (LOPRESSOR) 25 MG tablet, TAKE 2 TABLETS BY MOUTH EVERY MORNING AND TAKE 1 TABLET EVERY EVENING, Disp: 270 tablet, Rfl: 3 .  Probiotic Product (ALIGN PO), Take by mouth., Disp: , Rfl:  .  pyridoxine (B-6) 100 MG tablet, Take 100 mg by mouth daily., Disp: , Rfl:  .  vitamin B-12 (CYANOCOBALAMIN) 500 MCG tablet, Take 1,000 mcg by mouth 2 (two) times daily. , Disp: , Rfl:   Current Facility-Administered Medications:  .  0.9 %  sodium chloride infusion, 500 mL, Intravenous, Continuous, Nandigam, Kavitha  V, MD .  cloNIDine (CATAPRES) tablet 0.1 mg, 0.1 mg, Oral, Once, Nafziger, Tommi Rumps, NP  EXAM:  VITALS per patient if applicable:  GENERAL: alert, oriented, appears well and in no acute distress  HEENT: atraumatic, conjunttiva clear, no obvious abnormalities on inspection of external nose and ears  NECK: normal movements of the head and neck  LUNGS: on inspection no signs of respiratory distress, breathing rate appears normal, no obvious gross SOB, gasping or wheezing  CV: no obvious cyanosis  MS: moves all visible extremities without noticeable abnormality  PSYCH/NEURO: pleasant and cooperative, no obvious depression or anxiety, speech and thought processing grossly intact  ASSESSMENT AND PLAN:  Discussed the following assessment and plan:  #1 postmenopausal.  She has had some breakthrough hot flashes after stopping estrogen and more than that is having some sleep difficulties which she attributes to discontinuation  -Long discussion regarding risk and benefits of estrogen therapy.  We made the differentiation between estrogen versus estrogen plus progesterone.  We discussed risk and benefits of multiple areas including CAD, invasive breast cancer, stroke, pulmonary embolism, DVT, colorectal cancer, all fractures, all cause mortality.  We discussed the fact that plain estrogen therapy weighs on the benefits side for CAD, invasive breast cancer, stroke, colorectal cancer, all cause mortality, and all fractures.  She is aware that there is a very low risk of DVT which is approximately 2.5 per 1000 women per 5 years of use and even lower for pulmonary embolism.  Overall, with balancing quality of life and risk she would like to stay on the low-dose Estrace which seems reasonable.  Refills given for 1 year.  She will continue with yearly mammograms  #2 osteoarthritis involving multiple joints.  We discussed the importance of caution with chronic nonsteroidal use.  #3 hypertension.  This  remains well controlled by home readings     I discussed the assessment and treatment plan with the patient. The patient was provided an opportunity to ask questions and all were answered. The patient agreed with the plan and demonstrated an understanding of the instructions.   The patient was advised to call back or seek an in-person evaluation if the symptoms worsen or if the condition fails to improve as anticipated.   Carolann Littler, MD

## 2018-09-14 NOTE — Telephone Encounter (Signed)
Please see message.  Please advise. 

## 2018-09-14 NOTE — Telephone Encounter (Signed)
I called daughter Izora Gala and she stated that she is not able to do a Doxy because her mother is in a home and they will not allow daughter to see her mother and daughter does not know all of symptoms but daughter stated that when you call her she can call her mother and do a 3 way call for her visit today at 2:30pm.

## 2018-09-14 NOTE — Telephone Encounter (Signed)
Would consider setting up Doxy with Sara Medina to discuss.  I know we discussed at her physical, but she has increased risks of blood clots and stroke on estrogen at her age and I want to make sure we have good discussion of pros and cons before getting back on that.

## 2018-09-15 ENCOUNTER — Encounter: Payer: Self-pay | Admitting: Family Medicine

## 2018-09-17 ENCOUNTER — Other Ambulatory Visit: Payer: Self-pay | Admitting: Family Medicine

## 2018-10-26 ENCOUNTER — Other Ambulatory Visit: Payer: Self-pay | Admitting: Family Medicine

## 2018-10-26 DIAGNOSIS — Z1231 Encounter for screening mammogram for malignant neoplasm of breast: Secondary | ICD-10-CM

## 2018-11-13 ENCOUNTER — Ambulatory Visit (INDEPENDENT_AMBULATORY_CARE_PROVIDER_SITE_OTHER): Payer: Medicare Other | Admitting: Internal Medicine

## 2018-11-13 ENCOUNTER — Encounter: Payer: Self-pay | Admitting: Internal Medicine

## 2018-11-13 ENCOUNTER — Other Ambulatory Visit: Payer: Self-pay

## 2018-11-13 VITALS — BP 140/80 | HR 79 | Temp 98.1°F | Wt 129.8 lb

## 2018-11-13 DIAGNOSIS — L03116 Cellulitis of left lower limb: Secondary | ICD-10-CM

## 2018-11-13 MED ORDER — DOXYCYCLINE HYCLATE 100 MG PO TABS: 100.0000 mg | ORAL_TABLET | Freq: Two times a day (BID) | ORAL | 0 refills | Status: AC

## 2018-11-13 NOTE — Progress Notes (Signed)
Acute Office Visit     CC/Reason for Visit: red/swollen left leg around knee area  HPI: Sara Medina is a 85 y.o. female who is coming in today for the above mentioned reasons. 3 nights ago accidentally jammed a toothpick into the inside of her left knee. Yesterday noticed some redness. This morning she marked the redness with a black pen, and since then has extended significantly outside the margins so she wanted to get it checked out. No fever, minimal pain.   Past Medical/Surgical History: Past Medical History:  Diagnosis Date  . Allergy   . Arthritis   . Asthma   . Colon polyps   . Depression   . Diverticulosis   . GERD (gastroesophageal reflux disease)   . History of diverticulitis of colon   . Hyperlipidemia   . Hypertension   . Mitral valve prolapse   . Thyroid disease     Past Surgical History:  Procedure Laterality Date  . ABDOMINAL HYSTERECTOMY    . SHOULDER SURGERY Right   . TONSILLECTOMY    . trigger thumb Right     Social History:  reports that she has never smoked. She has never used smokeless tobacco. She reports current alcohol use. She reports that she does not use drugs.  Allergies: Allergies  Allergen Reactions  . Peanut-Containing Drug Products   . Penicillins     Family History:  Family History  Problem Relation Age of Onset  . Ovarian cancer Mother   . Heart disease Father   . Stroke Father   . Cancer Sister        brain  . Breast cancer Maternal Aunt   . Colon cancer Neg Hx      Current Outpatient Medications:  .  albuterol (VENTOLIN HFA) 108 (90 Base) MCG/ACT inhaler, INHALE TWO PUFFS INTO THE LUNGS EVERY 6 HOURS AS NEEDED FOR WHEEZING OR FOR SHORTNESS OF BREATH, Disp: 8.5 g, Rfl: 0 .  amLODipine (NORVASC) 5 MG tablet, Take 1 tablet (5 mg total) by mouth daily., Disp: 90 tablet, Rfl: 3 .  aspirin 81 MG tablet, Take 81 mg by mouth daily.  , Disp: , Rfl:  .  Cholecalciferol (VITAMIN D-3 PO), Take 1,000 Int'l Units by mouth  daily., Disp: , Rfl:  .  EPINEPHrine 0.3 mg/0.3 mL IJ SOAJ injection, Inject 0.3 mLs (0.3 mg total) into the muscle once., Disp: 2 Device, Rfl: 0 .  estradiol (ESTRACE) 0.5 MG tablet, Take 1 tablet (0.5 mg total) by mouth daily., Disp: 90 tablet, Rfl: 3 .  famotidine (PEPCID) 20 MG tablet, Take 20 mg by mouth daily., Disp: , Rfl:  .  folic acid (FOLVITE) 580 MCG tablet, Take 400 mcg by mouth 2 (two) times daily., Disp: , Rfl:  .  levothyroxine (SYNTHROID, LEVOTHROID) 25 MCG tablet, TAKE ONE TABLET BY MOUTH DAILY BEFORE BREAKFAST, Disp: 90 tablet, Rfl: 3 .  metoprolol tartrate (LOPRESSOR) 25 MG tablet, TAKE 2 TABLETS BY MOUTH EVERY MORNING AND TAKE 1 TABLET EVERY EVENING, Disp: 270 tablet, Rfl: 3 .  Probiotic Product (ALIGN PO), Take by mouth., Disp: , Rfl:  .  pyridoxine (B-6) 100 MG tablet, Take 100 mg by mouth daily., Disp: , Rfl:  .  vitamin B-12 (CYANOCOBALAMIN) 500 MCG tablet, Take 1,000 mcg by mouth 2 (two) times daily. , Disp: , Rfl:  .  doxycycline (VIBRA-TABS) 100 MG tablet, Take 1 tablet (100 mg total) by mouth 2 (two) times daily for 7 days., Disp: 14 tablet,  Rfl: 0  Current Facility-Administered Medications:  .  0.9 %  sodium chloride infusion, 500 mL, Intravenous, Continuous, Nandigam, Kavitha V, MD .  cloNIDine (CATAPRES) tablet 0.1 mg, 0.1 mg, Oral, Once, Nafziger, Cory, NP  Review of Systems:  Constitutional: Denies fever, chills, diaphoresis, appetite change and fatigue.  HEENT: Denies photophobia, eye pain, redness, hearing loss, ear pain, congestion, sore throat, rhinorrhea, sneezing, mouth sores, trouble swallowing, neck pain, neck stiffness and tinnitus.   Respiratory: Denies SOB, DOE, cough, chest tightness,  and wheezing.   Cardiovascular: Denies chest pain, palpitations and leg swelling.  Gastrointestinal: Denies nausea, vomiting, abdominal pain, diarrhea, constipation, blood in stool and abdominal distention.  Genitourinary: Denies dysuria, urgency, frequency,  hematuria, flank pain and difficulty urinating.  Endocrine: Denies: hot or cold intolerance, sweats, changes in hair or nails, polyuria, polydipsia. Musculoskeletal: Denies myalgias, back pain, joint swelling, arthralgias and gait problem.  Skin: Denies pallor, rash and wound.  Neurological: Denies dizziness, seizures, syncope, weakness, light-headedness, numbness and headaches.  Hematological: Denies adenopathy. Easy bruising, personal or family bleeding history  Psychiatric/Behavioral: Denies suicidal ideation, mood changes, confusion, nervousness, sleep disturbance and agitation    Physical Exam: Vitals:   11/13/18 1546  BP: 140/80  Pulse: 79  Temp: 98.1 F (36.7 C)  TempSrc: Oral  SpO2: 98%  Weight: 129 lb 12.8 oz (58.9 kg)    Body mass index is 23.74 kg/m.   Constitutional: NAD, calm, comfortable Eyes: PERRL, lids and conjunctivae normal, wears corrective lenses, HOH ENMT: Mucous membranes are moist.  Skin: see below pic:      Neurologic: CN 2-12 grossly intact. Sensation intact, DTR normal. Strength 5/5 in all 4.  Psychiatric: Normal judgment and insight. Alert and oriented x 3. Normal mood.    Impression and Plan:  Left leg cellulitis  -Doxy BID for 7 days. -RTC if fever or worsening cellulitis after 48 hours.    Patient Instructions  -Nice meeting you today!!  -Take doxycycline twice daily with meals for 7 days.   Cellulitis, Adult  Cellulitis is a skin infection. The infected area is often warm, red, swollen, and sore. It occurs most often in the arms and lower legs. It is very important to get treated for this condition. What are the causes? This condition is caused by bacteria. The bacteria enter through a break in the skin, such as a cut, burn, insect bite, open sore, or crack. What increases the risk? This condition is more likely to occur in people who:  Have a weak body defense system (immune system).  Have open cuts, burns, bites, or  scrapes on the skin.  Are older than 83 years of age.  Have a blood sugar problem (diabetes).  Have a long-lasting (chronic) liver disease (cirrhosis) or kidney disease.  Are very overweight (obese).  Have a skin problem, such as: ? Itchy rash (eczema). ? Slow movement of blood in the veins (venous stasis). ? Fluid buildup below the skin (edema).  Have been treated with high-energy rays (radiation).  Use IV drugs. What are the signs or symptoms? Symptoms of this condition include:  Skin that is: ? Red. ? Streaking. ? Spotting. ? Swollen. ? Sore or painful when you touch it. ? Warm.  A fever.  Chills.  Blisters. How is this diagnosed? This condition is diagnosed based on:  Medical history.  Physical exam.  Blood tests.  Imaging tests. How is this treated? Treatment for this condition may include:  Medicines to treat infections or allergies.  Home  care, such as: ? Rest. ? Placing cold or warm cloths (compresses) on the skin.  Hospital care, if the condition is very bad. Follow these instructions at home: Medicines  Take over-the-counter and prescription medicines only as told by your doctor.  If you were prescribed an antibiotic medicine, take it as told by your doctor. Do not stop taking it even if you start to feel better. General instructions   Drink enough fluid to keep your pee (urine) pale yellow.  Do not touch or rub the infected area.  Raise (elevate) the infected area above the level of your heart while you are sitting or lying down.  Place cold or warm cloths on the area as told by your doctor.  Keep all follow-up visits as told by your doctor. This is important. Contact a doctor if:  You have a fever.  You do not start to get better after 1-2 days of treatment.  Your bone or joint under the infected area starts to hurt after the skin has healed.  Your infection comes back. This can happen in the same area or another area.  You  have a swollen bump in the area.  You have new symptoms.  You feel ill and have muscle aches and pains. Get help right away if:  Your symptoms get worse.  You feel very sleepy.  You throw up (vomit) or have watery poop (diarrhea) for a long time.  You see red streaks coming from the area.  Your red area gets larger.  Your red area turns dark in color. These symptoms may represent a serious problem that is an emergency. Do not wait to see if the symptoms will go away. Get medical help right away. Call your local emergency services (911 in the [place] Do not drive yourself to the hospital. Summary  Cellulitis is a skin infection. The area is often warm, red, swollen, and sore.  This condition is treated with medicines, rest, and cold and warm cloths.  Take all medicines only as told by your doctor.  Tell your doctor if symptoms do not start to get better after 1-2 days of treatment. This information is not intended to replace advice given to you by your health care provider. Make sure you discuss any questions you have with your health care provider. Document Released: 11/02/2007 Document Revised: 10/05/2017 Document Reviewed: 10/05/2017 Elsevier Interactive Patient Education  2019 [place], MD [facility]

## 2018-11-13 NOTE — Patient Instructions (Signed)
-Nice meeting you today!!  -Take doxycycline twice daily with meals for 7 days.   Cellulitis, Adult  Cellulitis is a skin infection. The infected area is often warm, red, swollen, and sore. It occurs most often in the arms and lower legs. It is very important to get treated for this condition. What are the causes? This condition is caused by bacteria. The bacteria enter through a break in the skin, such as a cut, burn, insect bite, open sore, or crack. What increases the risk? This condition is more likely to occur in people who:  Have a weak body defense system (immune system).  Have open cuts, burns, bites, or scrapes on the skin.  Are older than 83 years of age.  Have a blood sugar problem (diabetes).  Have a long-lasting (chronic) liver disease (cirrhosis) or kidney disease.  Are very overweight (obese).  Have a skin problem, such as: ? Itchy rash (eczema). ? Slow movement of blood in the veins (venous stasis). ? Fluid buildup below the skin (edema).  Have been treated with high-energy rays (radiation).  Use IV drugs. What are the signs or symptoms? Symptoms of this condition include:  Skin that is: ? Red. ? Streaking. ? Spotting. ? Swollen. ? Sore or painful when you touch it. ? Warm.  A fever.  Chills.  Blisters. How is this diagnosed? This condition is diagnosed based on:  Medical history.  Physical exam.  Blood tests.  Imaging tests. How is this treated? Treatment for this condition may include:  Medicines to treat infections or allergies.  Home care, such as: ? Rest. ? Placing cold or warm cloths (compresses) on the skin.  Hospital care, if the condition is very bad. Follow these instructions at home: Medicines  Take over-the-counter and prescription medicines only as told by your doctor.  If you were prescribed an antibiotic medicine, take it as told by your doctor. Do not stop taking it even if you start to feel better. General  instructions   Drink enough fluid to keep your pee (urine) pale yellow.  Do not touch or rub the infected area.  Raise (elevate) the infected area above the level of your heart while you are sitting or lying down.  Place cold or warm cloths on the area as told by your doctor.  Keep all follow-up visits as told by your doctor. This is important. Contact a doctor if:  You have a fever.  You do not start to get better after 1-2 days of treatment.  Your bone or joint under the infected area starts to hurt after the skin has healed.  Your infection comes back. This can happen in the same area or another area.  You have a swollen bump in the area.  You have new symptoms.  You feel ill and have muscle aches and pains. Get help right away if:  Your symptoms get worse.  You feel very sleepy.  You throw up (vomit) or have watery poop (diarrhea) for a long time.  You see red streaks coming from the area.  Your red area gets larger.  Your red area turns dark in color. These symptoms may represent a serious problem that is an emergency. Do not wait to see if the symptoms will go away. Get medical help right away. Call your local emergency services (911 in the [place] Do not drive yourself to the hospital. Summary  Cellulitis is a skin infection. The area is often warm, red, swollen, and sore.  This  condition is treated with medicines, rest, and cold and warm cloths.  Take all medicines only as told by your doctor.  Tell your doctor if symptoms do not start to get better after 1-2 days of treatment. This information is not intended to replace advice given to you by your health care provider. Make sure you discuss any questions you have with your health care provider. Document Released: 11/02/2007 Document Revised: 10/05/2017 Document Reviewed: 10/05/2017 Elsevier Interactive Patient Education  2019 [employer].

## 2018-12-07 DIAGNOSIS — M25552 Pain in left hip: Secondary | ICD-10-CM | POA: Diagnosis not present

## 2018-12-07 DIAGNOSIS — M25551 Pain in right hip: Secondary | ICD-10-CM | POA: Diagnosis not present

## 2018-12-07 DIAGNOSIS — M545 Low back pain: Secondary | ICD-10-CM | POA: Diagnosis not present

## 2018-12-08 ENCOUNTER — Other Ambulatory Visit: Payer: Self-pay | Admitting: Family Medicine

## 2018-12-13 ENCOUNTER — Other Ambulatory Visit: Payer: Self-pay

## 2018-12-13 ENCOUNTER — Ambulatory Visit
Admission: RE | Admit: 2018-12-13 | Discharge: 2018-12-13 | Disposition: A | Discharge status: Home / Self Care | Payer: Medicare Other | Source: Ambulatory Visit | Attending: Family Medicine | Admitting: Family Medicine

## 2018-12-13 DIAGNOSIS — Z1231 Encounter for screening mammogram for malignant neoplasm of breast: Secondary | ICD-10-CM | POA: Diagnosis not present

## 2018-12-17 DIAGNOSIS — M5416 Radiculopathy, lumbar region: Secondary | ICD-10-CM | POA: Diagnosis not present

## 2019-01-08 DIAGNOSIS — M5136 Other intervertebral disc degeneration, lumbar region: Secondary | ICD-10-CM | POA: Diagnosis not present

## 2019-01-08 DIAGNOSIS — M48061 Spinal stenosis, lumbar region without neurogenic claudication: Secondary | ICD-10-CM | POA: Diagnosis not present

## 2019-01-31 DIAGNOSIS — M545 Low back pain: Secondary | ICD-10-CM | POA: Diagnosis not present

## 2019-02-07 DIAGNOSIS — Z23 Encounter for immunization: Secondary | ICD-10-CM | POA: Diagnosis not present

## 2019-02-22 ENCOUNTER — Other Ambulatory Visit: Payer: Self-pay

## 2019-02-22 ENCOUNTER — Telehealth (INDEPENDENT_AMBULATORY_CARE_PROVIDER_SITE_OTHER): Payer: Medicare Other | Admitting: Family Medicine

## 2019-02-22 ENCOUNTER — Encounter: Payer: Self-pay | Admitting: Family Medicine

## 2019-02-22 ENCOUNTER — Ambulatory Visit: Payer: Self-pay

## 2019-02-22 DIAGNOSIS — S0181XA Laceration without foreign body of other part of head, initial encounter: Secondary | ICD-10-CM | POA: Diagnosis not present

## 2019-02-22 DIAGNOSIS — W5503XA Scratched by cat, initial encounter: Secondary | ICD-10-CM | POA: Diagnosis not present

## 2019-02-22 MED ORDER — DOXYCYCLINE HYCLATE 100 MG PO TABS: 100.0000 mg | ORAL_TABLET | Freq: Two times a day (BID) | ORAL | 0 refills | Status: DC

## 2019-02-22 NOTE — Telephone Encounter (Signed)
Pt scheduled for virtual visit 

## 2019-02-22 NOTE — Telephone Encounter (Signed)
Pt. Reports that during the night her cat scratched her right middle finger. Reports the claw went through the flesh on that finger. Bruised and swollen. Cle.eaned it well with soap and water. Requests "someone take a look at it and be sure she doesn't need an antibiotic. Warm transfer to [facility] in the practic  Answer Assessment - Initial Assessment Questions 1. APPEARANCE of INJURY: "What does the injury look like?"      Right hand - middle finger. Puncture wounds 2. SIZE: "How large is the cut?"      Small 3. BLEEDING: "Is it bleeding now?" If so, ask: "Is it difficult to stop?"      No 4. LOCATION: "Where is the injury located?"      Right middle finger 5. ONSET: "How long ago did the injury occur?"      During the night 6. MECHANISM: "Tell me how it happened."      Cat scratch 7. TETANUS: "When was the last tetanus booster?"     Unsure 8. PREGNANCY: "Is there any chance you are pregnant?" "When was your last menstrual period?"     No  Protocols used: [place]

## 2019-02-22 NOTE — Progress Notes (Signed)
Virtual Visit via Video Note  I connected with the patient on 02/22/19 at 10:30 AM EDT by a video enabled telemedicine application and verified that I am speaking with the correct person using two identifiers.  Location patient: home Location provider:work or home office Persons participating in the virtual visit: patient, provider  I discussed the limitations of evaluation and management by telemedicine and the availability of in person appointments. The patient expressed understanding and agreed to proceed.   HPI: Early this morning she rolled over in bed and startled her cat, who scratched her right 3rd finger with a claw. The finger is now black and blue and sore.    ROS: See pertinent positives and negatives per HPI.  Past Medical History:  Diagnosis Date  . Allergy   . Arthritis   . Asthma   . Colon polyps   . Depression   . Diverticulosis   . GERD (gastroesophageal reflux disease)   . History of diverticulitis of colon   . Hyperlipidemia   . Hypertension   . Mitral valve prolapse   . Thyroid disease     Past Surgical History:  Procedure Laterality Date  . ABDOMINAL HYSTERECTOMY    . SHOULDER SURGERY Right   . TONSILLECTOMY    . trigger thumb Right     Family History  Problem Relation Age of Onset  . Ovarian cancer Mother   . Heart disease Father   . Stroke Father   . Cancer Sister        brain  . Breast cancer Maternal Aunt   . Colon cancer Neg Hx      Current Outpatient Medications:  .  albuterol (VENTOLIN HFA) 108 (90 Base) MCG/ACT inhaler, INHALE TWO PUFFS BY MOUTH EVERY 6 HOURS AS NEEDED FOR WHEEZING OR FOR SHORTNESS OF BREATH, Disp: 8.5 g, Rfl: 0 .  amLODipine (NORVASC) 5 MG tablet, Take 1 tablet (5 mg total) by mouth daily., Disp: 90 tablet, Rfl: 3 .  aspirin 81 MG tablet, Take 81 mg by mouth daily.  , Disp: , Rfl:  .  Cholecalciferol (VITAMIN D-3 PO), Take 1,000 Int'l Units by mouth daily., Disp: , Rfl:  .  doxycycline (VIBRA-TABS) 100 MG tablet,  Take 1 tablet (100 mg total) by mouth 2 (two) times daily., Disp: 20 tablet, Rfl: 0 .  EPINEPHrine 0.3 mg/0.3 mL IJ SOAJ injection, Inject 0.3 mLs (0.3 mg total) into the muscle once., Disp: 2 Device, Rfl: 0 .  estradiol (ESTRACE) 0.5 MG tablet, Take 1 tablet (0.5 mg total) by mouth daily., Disp: 90 tablet, Rfl: 3 .  famotidine (PEPCID) 20 MG tablet, Take 20 mg by mouth daily., Disp: , Rfl:  .  folic acid (FOLVITE) A999333 MCG tablet, Take 400 mcg by mouth 2 (two) times daily., Disp: , Rfl:  .  levothyroxine (SYNTHROID, LEVOTHROID) 25 MCG tablet, TAKE ONE TABLET BY MOUTH DAILY BEFORE BREAKFAST, Disp: 90 tablet, Rfl: 3 .  metoprolol tartrate (LOPRESSOR) 25 MG tablet, TAKE 2 TABLETS BY MOUTH EVERY MORNING AND TAKE 1 TABLET EVERY EVENING, Disp: 270 tablet, Rfl: 3 .  Probiotic Product (ALIGN PO), Take by mouth., Disp: , Rfl:  .  pyridoxine (B-6) 100 MG tablet, Take 100 mg by mouth daily., Disp: , Rfl:  .  vitamin B-12 (CYANOCOBALAMIN) 500 MCG tablet, Take 1,000 mcg by mouth 2 (two) times daily. , Disp: , Rfl:   Current Facility-Administered Medications:  .  0.9 %  sodium chloride infusion, 500 mL, Intravenous, Continuous, Nandigam, Venia Minks, MD .  cloNIDine (CATAPRES) tablet 0.1 mg, 0.1 mg, Oral, Once, Nafziger, Tommi Rumps, NP  EXAM:  VITALS per patient if applicable:  GENERAL: alert, oriented, appears well and in no acute distress  HEENT: atraumatic, conjunttiva clear, no obvious abnormalities on inspection of external nose and ears  NECK: normal movements of the head and neck  LUNGS: on inspection no signs of respiratory distress, breathing rate appears normal, no obvious gross SOB, gasping or wheezing  CV: no obvious cyanosis  MS: moves all visible extremities without noticeable abnormality  PSYCH/NEURO: pleasant and cooperative, no obvious depression or anxiety, speech and thought processing grossly intact  ASSESSMENT AND PLAN: Cat scratch, treat with Doxycycline for 10 days. She can also  soak this in warm water with Epsom salts. Recheck prn.  Alysia Penna, MD  Discussed the following assessment and plan:  No diagnosis found.     I discussed the assessment and treatment plan with the patient. The patient was provided an opportunity to ask questions and all were answered. The patient agreed with the plan and demonstrated an understanding of the instructions.   The patient was advised to call back or seek an in-person evaluation if the symptoms worsen or if the condition fails to improve as anticipated.

## 2019-04-17 DIAGNOSIS — R200 Anesthesia of skin: Secondary | ICD-10-CM | POA: Diagnosis not present

## 2019-04-17 DIAGNOSIS — M48061 Spinal stenosis, lumbar region without neurogenic claudication: Secondary | ICD-10-CM | POA: Diagnosis not present

## 2019-04-29 DIAGNOSIS — G5603 Carpal tunnel syndrome, bilateral upper limbs: Secondary | ICD-10-CM | POA: Diagnosis not present

## 2019-04-30 DIAGNOSIS — H52223 Regular astigmatism, bilateral: Secondary | ICD-10-CM | POA: Diagnosis not present

## 2019-04-30 DIAGNOSIS — H524 Presbyopia: Secondary | ICD-10-CM | POA: Diagnosis not present

## 2019-04-30 DIAGNOSIS — H5203 Hypermetropia, bilateral: Secondary | ICD-10-CM | POA: Diagnosis not present

## 2019-05-06 ENCOUNTER — Other Ambulatory Visit: Payer: Self-pay | Admitting: Family Medicine

## 2019-05-16 ENCOUNTER — Other Ambulatory Visit: Payer: Self-pay | Admitting: Family Medicine

## 2019-05-16 NOTE — Telephone Encounter (Signed)
Requested medication (s) are due for refill today: ys  Requested medication (s) are on the active medication list: yes   Future visit scheduled:no  Notes to clinic: script expired on 03/02/2017 Review for refill Daughter would like to have one on hand for patient   Requested Prescriptions  Pending Prescriptions Disp Refills   EPINEPHrine 0.3 mg/0.3 mL IJ SOAJ injection 0.3 mL 0    Sig: Inject 0.3 mLs (0.3 mg total) into the muscle once for 1 dose.      Immunology: Antidotes Passed - 05/16/2019 11:32 AM      Passed - Valid encounter within last 12 months    Recent Outpatient Visits           2 months ago Cat scratch   [facility], MD   6 months ago Left leg cellulitis   [facility], Rayford Halsted, MD   8 months ago Menopausal disorder   [facility], Alinda Sierras, MD   10 months ago Medicare annual wellness visit, subsequent   [facility], Alinda Sierras, MD   2 years ago Essential hypertension   [facility], Alinda Sierras, MD

## 2019-05-16 NOTE — Telephone Encounter (Signed)
Medication Refill - Medication: EPINEPHrine 0.3 mg/0.3 mL IJ SOAJ injection  Pt's daughter stated pt's rx for epipen is expired and she would like to have one on hand just in case. Please advise. Please contact daughter with questions/issues with refilling  Has the patient contacted their pharmacy? Yes.   (Agent: If no, request that the patient contact the pharmacy for the refill.) (Agent: If yes, when and what did the pharmacy advise?)  Preferred Pharmacy (with phone number or street name):  [facility] [address], [place] Phone:  [phone]  Fax:  [phone]       Agent: Please be advised that RX refills may take up to 3 business days. We ask that you follow-up with your pharmacy.

## 2019-05-17 MED ORDER — EPINEPHRINE 0.3 MG/0.3ML IJ SOAJ: 0.3000 mg | Freq: Once | INTRAMUSCULAR | 0 refills | Status: AC

## 2019-06-03 DIAGNOSIS — M48061 Spinal stenosis, lumbar region without neurogenic claudication: Secondary | ICD-10-CM | POA: Diagnosis not present

## 2019-06-03 DIAGNOSIS — M5136 Other intervertebral disc degeneration, lumbar region: Secondary | ICD-10-CM | POA: Diagnosis not present

## 2019-06-08 ENCOUNTER — Other Ambulatory Visit: Payer: Self-pay | Admitting: Family Medicine

## 2019-06-08 DIAGNOSIS — I10 Essential (primary) hypertension: Secondary | ICD-10-CM

## 2019-06-10 ENCOUNTER — Encounter: Payer: Self-pay | Admitting: Family Medicine

## 2019-06-20 DIAGNOSIS — G5602 Carpal tunnel syndrome, left upper limb: Secondary | ICD-10-CM | POA: Diagnosis not present

## 2019-06-20 DIAGNOSIS — Z23 Encounter for immunization: Secondary | ICD-10-CM | POA: Diagnosis not present

## 2019-07-09 ENCOUNTER — Other Ambulatory Visit: Payer: Self-pay | Admitting: Family Medicine

## 2019-07-09 DIAGNOSIS — I10 Essential (primary) hypertension: Secondary | ICD-10-CM

## 2019-07-18 DIAGNOSIS — Z23 Encounter for immunization: Secondary | ICD-10-CM | POA: Diagnosis not present

## 2019-08-08 ENCOUNTER — Other Ambulatory Visit: Payer: Self-pay | Admitting: Family Medicine

## 2019-08-08 DIAGNOSIS — I10 Essential (primary) hypertension: Secondary | ICD-10-CM

## 2019-08-19 ENCOUNTER — Emergency Department (HOSPITAL_COMMUNITY): Payer: Medicare Other

## 2019-08-19 ENCOUNTER — Emergency Department (HOSPITAL_COMMUNITY)
Admission: EM | Admit: 2019-08-19 | Discharge: 2019-08-19 | Disposition: A | Discharge status: Home / Self Care | Payer: Medicare Other | Attending: Emergency Medicine | Admitting: Emergency Medicine

## 2019-08-19 ENCOUNTER — Encounter (HOSPITAL_COMMUNITY): Payer: Self-pay | Admitting: *Deleted

## 2019-08-19 DIAGNOSIS — J45901 Unspecified asthma with (acute) exacerbation: Secondary | ICD-10-CM | POA: Insufficient documentation

## 2019-08-19 DIAGNOSIS — Z0389 Encounter for observation for other suspected diseases and conditions ruled out: Secondary | ICD-10-CM | POA: Diagnosis not present

## 2019-08-19 DIAGNOSIS — I10 Essential (primary) hypertension: Secondary | ICD-10-CM | POA: Diagnosis not present

## 2019-08-19 DIAGNOSIS — Z79899 Other long term (current) drug therapy: Secondary | ICD-10-CM | POA: Insufficient documentation

## 2019-08-19 DIAGNOSIS — R079 Chest pain, unspecified: Secondary | ICD-10-CM | POA: Diagnosis not present

## 2019-08-19 DIAGNOSIS — Z7982 Long term (current) use of aspirin: Secondary | ICD-10-CM | POA: Insufficient documentation

## 2019-08-19 DIAGNOSIS — Z91010 Allergy to peanuts: Secondary | ICD-10-CM | POA: Diagnosis not present

## 2019-08-19 DIAGNOSIS — E039 Hypothyroidism, unspecified: Secondary | ICD-10-CM | POA: Diagnosis not present

## 2019-08-19 DIAGNOSIS — I700 Atherosclerosis of aorta: Secondary | ICD-10-CM | POA: Diagnosis not present

## 2019-08-19 DIAGNOSIS — R0789 Other chest pain: Secondary | ICD-10-CM | POA: Diagnosis not present

## 2019-08-19 DIAGNOSIS — R0600 Dyspnea, unspecified: Secondary | ICD-10-CM

## 2019-08-19 DIAGNOSIS — R0602 Shortness of breath: Secondary | ICD-10-CM | POA: Diagnosis not present

## 2019-08-19 LAB — CBC
HCT: 39.6 % (ref 36.0–46.0)
Hemoglobin: 13.4 g/dL (ref 12.0–15.0)
MCH: 32.8 pg (ref 26.0–34.0)
MCHC: 33.8 g/dL (ref 30.0–36.0)
MCV: 97.1 fL (ref 80.0–100.0)
Platelets: 318 10*3/uL (ref 150–400)
RBC: 4.08 MIL/uL (ref 3.87–5.11)
RDW: 12.5 % (ref 11.5–15.5)
WBC: 8.7 10*3/uL (ref 4.0–10.5)
nRBC: 0 % (ref 0.0–0.2)

## 2019-08-19 LAB — BASIC METABOLIC PANEL
Anion gap: 12 (ref 5–15)
BUN: 17 mg/dL (ref 8–23)
CO2: 21 mmol/L — ABNORMAL LOW (ref 22–32)
Calcium: 9.5 mg/dL (ref 8.9–10.3)
Chloride: 103 mmol/L (ref 98–111)
Creatinine, Ser: 0.84 mg/dL (ref 0.44–1.00)
GFR calc Af Amer: >60 mL/min (ref >60)
GFR calc non Af Amer: >60 mL/min (ref >60)
Glucose, Bld: 128 mg/dL — ABNORMAL HIGH (ref 70–99)
Potassium: 4 mmol/L (ref 3.5–5.1)
Sodium: 136 mmol/L (ref 135–145)

## 2019-08-19 LAB — TROPONIN I (HIGH SENSITIVITY)
Troponin I (High Sensitivity): 6 ng/L (ref <18)
Troponin I (High Sensitivity): 6 ng/L (ref <18)

## 2019-08-19 MED ORDER — PREDNISONE 20 MG PO TABS
40.0000 mg | ORAL_TABLET | Freq: Once | ORAL | Status: AC
  Administered 2019-08-19: 40 mg via ORAL
  Filled 2019-08-19: qty 2

## 2019-08-19 MED ORDER — IOHEXOL 350 MG/ML SOLN
100.0000 mL | Freq: Once | INTRAVENOUS | Status: AC | PRN
  Administered 2019-08-19: 100 mL via INTRAVENOUS

## 2019-08-19 MED ORDER — PREDNISONE 20 MG PO TABS: 40.0000 mg | ORAL_TABLET | Freq: Every day | ORAL | 0 refills | Status: DC

## 2019-08-19 MED ORDER — SODIUM CHLORIDE 0.9% FLUSH: 3.0000 mL | Freq: Once | INTRAVENOUS | Status: DC

## 2019-08-19 NOTE — Discharge Instructions (Signed)
Follow-up with Dr. Harrington Challenger from cardiology on Thursday as planned.

## 2019-08-19 NOTE — ED Notes (Signed)
Pt ambulated in room. Spo2 remained above 95% on RA at all times. Pt placed back in bed and on monitor. NAD noted at this time.

## 2019-08-19 NOTE — ED Triage Notes (Signed)
To ED for eval of sob starting this am. States she slept well last night. Describes the sob and unable to take a deep breath - pressure on chest. Pt speaks in full clear sentences. Resp e/u. Pt is being treated for spinal stenosis also. No n/v/d.

## 2019-08-19 NOTE — ED Notes (Signed)
Patient verbalizes understanding of discharge instructions. Opportunity for questioning and answers were provided. Armband removed by staff, pt discharged from ED. Pt. ambulatory and discharged home.  

## 2019-08-19 NOTE — ED Provider Notes (Signed)
[facility] EMERGENCY DEPARTMENT Provider Note   CSN: [id number] Arrival date & time: 08/19/19  1518     History Chief Complaint  Patient presents with  . Shortness of Breath    Sara Medina is a 86 y.o. female.  HPI Patient presents with shortness of breath.  Has had some episodes of it over the last couple weeks but much more short of breath since last night.  States has trouble getting around due to it.  Has some tightness in her anterior chest.  No fevers.  Has a chronic dry cough that is unchanged.  No swelling of legs.  No fever.  Does have a history of asthma.  States she is on steroids for spinal stenosis but it appears that it is mostly steroid injections.  No sick contacts.  No fevers or chills.    Past Medical History:  Diagnosis Date  . Allergy   . Arthritis   . Asthma   . Colon polyps   . Depression   . Diverticulosis   . GERD (gastroesophageal reflux disease)   . History of diverticulitis of colon   . Hyperlipidemia   . Hypertension   . Mitral valve prolapse   . Thyroid disease     Patient Active Problem List   Diagnosis Date Noted  . Cystocele 10/02/2014  . Palpitations 06/30/2014  . Chest tightness or pressure 05/13/2013  . Asthma, exercise induced 08/18/2010  . Menopausal disorder 08/18/2010  . COLONIC POLYPS 05/04/2010  . Hypothyroidism 05/04/2010  . HYPERLIPIDEMIA 05/04/2010  . Essential hypertension 05/04/2010  . CARDIAC ARRHYTHMIA 05/04/2010  . ALLERGIC RHINITIS 05/04/2010  . GERD 05/04/2010  . DIVERTICULOSIS, COLON 05/04/2010  . Osteoarthritis 05/04/2010  . History of cardiovascular disorder 05/04/2010  . DIVERTICULITIS, HX OF 05/04/2010    Past Surgical History:  Procedure Laterality Date  . ABDOMINAL HYSTERECTOMY    . SHOULDER SURGERY Right   . TONSILLECTOMY    . trigger thumb Right      OB History   No obstetric history on file.     Family History  Problem Relation Age of Onset  . Ovarian cancer Mother     . Heart disease Father   . Stroke Father   . Cancer Sister        brain  . Breast cancer Maternal Aunt   . Colon cancer Neg Hx     Social History   Tobacco Use  . Smoking status: Never Smoker  . Smokeless tobacco: Never Used  Substance Use Topics  . Alcohol use: Yes    Alcohol/week: 0.0 standard drinks    Comment: Couple glasses of wine per week  . Drug use: No    Home Medications Prior to Admission medications   Medication Sig Start Date End Date Taking? Authorizing Provider  albuterol (VENTOLIN HFA) 108 (90 Base) MCG/ACT inhaler INHALE TWO PUFFS BY MOUTH EVERY 6 HOURS AS NEEDED FOR WHEEZING OR FOR SHORTNESS OF BREATH Patient taking differently: Inhale 2 puffs into the lungs every 6 (six) hours as needed for wheezing or shortness of breath (or exercise-induced asthma symptoms).  05/06/19  Yes Burchette, Alinda Sierras, MD  amLODipine (NORVASC) 5 MG tablet TAKE ONE TABLET BY MOUTH DAILY; MAKE DOCTOR'S APPOINTMENT FOR FURTHER REFILLS Patient taking differently: Take 5 mg by mouth daily.  08/08/19  Yes Burchette, Alinda Sierras, MD  aspirin 81 MG tablet Take 81 mg by mouth daily.     Yes [provider]  Cholecalciferol (VITAMIN D-3) 25 MCG (  1000 UT) CAPS Take 1,000 Units by mouth daily.   Yes [provider]  EPINEPHrine (EPIPEN 2-PAK) 0.3 mg/0.3 mL IJ SOAJ injection Inject 0.3 mg into the muscle as needed for anaphylaxis.   Yes [provider]  famotidine (PEPCID) 20 MG tablet Take 20 mg by mouth daily as needed (for GERD).    Yes [provider]  folic acid (FOLVITE) A999333 MCG tablet Take 400 mcg by mouth 2 (two) times daily.   Yes [provider]  levothyroxine (SYNTHROID) 25 MCG tablet TAKE ONE TABLET BY MOUTH DAILY BEFORE BREAKFAST; MAKE DOCTOR BURCHETTE'S APPOINTMENT FOR FURTHER REFILLS ! Patient taking differently: Take 25 mcg by mouth daily before breakfast.  08/08/19  Yes Burchette, Alinda Sierras, MD  metoprolol tartrate (LOPRESSOR) 25 MG tablet TAKE 2  TABLETS BY MOUTH EVERY MORNING AND TAKE 1 TABLET EVERY EVENING Patient taking differently: Take 25-50 mg by mouth See admin instructions. Take 50 mg by mouth in the morning and 25 mg at bedtime 06/25/18  Yes Burchette, Alinda Sierras, MD  Probiotic Product (ALIGN) 4 MG CAPS Take 4 mg by mouth daily.   Yes [provider]  pyridoxine (B-6) 100 MG tablet Take 100 mg by mouth daily.   Yes [provider]  vitamin B-12 (CYANOCOBALAMIN) 500 MCG tablet Take 1,000 mcg by mouth 2 (two) times daily.    Yes [provider]  doxycycline (VIBRA-TABS) 100 MG tablet Take 1 tablet (100 mg total) by mouth 2 (two) times daily. Patient not taking: Reported on 08/19/2019 02/22/19   Laurey Morale, MD  estradiol (ESTRACE) 0.5 MG tablet Take 1 tablet (0.5 mg total) by mouth daily. Patient not taking: Reported on 08/19/2019 09/14/18   Eulas Post, MD  predniSONE (DELTASONE) 20 MG tablet Take 2 tablets (40 mg total) by mouth daily. 08/20/19   Davonna Belling, MD    Allergies    Other, Peanut-containing drug products, Pecan nut (diagnostic), and Penicillins  Review of Systems   Review of Systems  Constitutional: Negative for appetite change.  HENT: Negative for congestion.   Respiratory: Positive for shortness of breath.   Cardiovascular: Positive for chest pain. Negative for leg swelling.  Gastrointestinal: Negative for abdominal pain.  Genitourinary: Negative for flank pain.  Musculoskeletal: Negative for gait problem.  Skin: Negative for pallor.  Neurological: Negative for weakness.  Psychiatric/Behavioral: Negative for confusion.    Physical Exam Updated Vital Signs BP (!) 120/107   Pulse 75   Temp 97.9 F (36.6 C) (Oral)   Resp 19   Ht 5\' 3"  (1.6 m)   Wt 61.2 kg   SpO2 95%   BMI 23.91 kg/m   Physical Exam Vitals reviewed.  Eyes:     Pupils: Pupils are equal, round, and reactive to light.  Cardiovascular:     Rate and Rhythm: Normal rate and regular rhythm.    Pulmonary:     Breath sounds: No wheezing, rhonchi or rales.  Chest:     Chest wall: No tenderness.  Abdominal:     Tenderness: There is no abdominal tenderness.  Musculoskeletal:     Comments: Mild edema bilateral lower extremities.  Skin:    Capillary Refill: Capillary refill takes less than 2 seconds.  Neurological:     Mental Status: She is alert and oriented to person, place, and time.     ED Results / Procedures / Treatments   Labs (all labs ordered are listed, but only abnormal results are displayed) Labs Reviewed  BASIC METABOLIC  PANEL - Abnormal; Notable for the following components:      Result Value   CO2 21 (*)    Glucose, Bld 128 (*)    All other components within normal limits  CBC  TROPONIN I (HIGH SENSITIVITY)  TROPONIN I (HIGH SENSITIVITY)    EKG EKG Interpretation  Date/Time:  Monday August 19 2019 16:08:17 EDT Ventricular Rate:  80 PR Interval:  246 QRS Duration: 72 QT Interval:  384 QTC Calculation: 442 R Axis:   64 Text Interpretation: Sinus rhythm with 1st degree A-V block Septal infarct , age undetermined Abnormal ECG Pulmonary disease pattern Confirmed by Davonna Belling [phone]) on 08/19/2019 8:21:55 PM   Radiology DG Chest 2 View  Result Date: 08/19/2019 CLINICAL DATA:  Chest pain, shortness of breath. EXAM: CHEST - 2 VIEW COMPARISON:  May 13, 2013. FINDINGS: The heart size and mediastinal contours are within normal limits. Both lungs are clear. No pneumothorax or pleural effusion is noted. The visualized skeletal structures are unremarkable. IMPRESSION: No active cardiopulmonary disease. Electronically Signed   By: Marijo Conception M.D.   On: 08/19/2019 16:26   CT Angio Chest PE W and/or Wo Contrast  Result Date: 08/19/2019 CLINICAL DATA:  Suspected pulmonary embolus, high probability. EXAM: CT ANGIOGRAPHY CHEST WITH CONTRAST TECHNIQUE: Multidetector CT imaging of the chest was performed using the standard protocol during bolus  administration of intravenous contrast. Multiplanar CT image reconstructions and MIPs were obtained to evaluate the vascular anatomy. CONTRAST:  149mL OMNIPAQUE IOHEXOL 350 MG/ML SOLN COMPARISON:  None. FINDINGS: Cardiovascular: No filling defects in the pulmonary arteries to suggest pulmonary emboli. Mild cardiomegaly. Aorta is normal caliber. Scattered coronary artery and aortic atherosclerosis. Mediastinum/Nodes: No mediastinal, hilar, or axillary adenopathy. Lungs/Pleura: No confluent opacities or effusions. Upper Abdomen: Calcifications in the liver and spleen compatible with old granulomatous disease. No acute findings. Musculoskeletal: Chest wall soft tissues are unremarkable. No acute bony abnormality. Review of the MIP images confirms the above findings. IMPRESSION: No evidence of pulmonary embolus. Cardiomegaly, coronary artery disease. Aortic Atherosclerosis (ICD10-I70.0). Electronically Signed   By: Rolm Baptise M.D.   On: 08/19/2019 21:21    Procedures Procedures (including critical care time)  Medications Ordered in ED Medications  sodium chloride flush (NS) 0.9 % injection 3 mL (has no administration in time range)  iohexol (OMNIPAQUE) 350 MG/ML injection 100 mL (100 mLs Intravenous Contrast Given 08/19/19 2112)  predniSONE (DELTASONE) tablet 40 mg (40 mg Oral Given 08/19/19 2244)    ED Course  I have reviewed the triage vital signs and the nursing notes.  Pertinent labs & imaging results that were available during my care of the patient were reviewed by me and considered in my medical decision making (see chart for details).    MDM Rules/Calculators/A&P                      Patient with shortness of breath.  Has had for several days.  However now developed some chest pain.  States it is pressure with a deep breath.  CTA done and reassuring.  EKG does show a pulmonary disease pattern.  Troponin negative x2.  Not hypoxic ambulating.  Has cardiology follow-up in 3 days.  Does have  coronary disease seen on CTA.  I think is reasonable for follow-up as an outpatient in 3 days.  We will however give trial of prednisone since history of asthma and is primarily shortness of breath. Final Clinical Impression(s) / ED Diagnoses Final diagnoses:  Dyspnea,  unspecified type  Exacerbation of asthma, unspecified asthma severity, unspecified whether persistent    Rx / DC Orders ED Discharge Orders         Ordered    predniSONE (DELTASONE) 20 MG tablet  Daily     08/19/19 2240           Davonna Belling, MD 08/19/19 2248

## 2019-08-20 ENCOUNTER — Telehealth: Payer: Self-pay | Admitting: Internal Medicine

## 2019-08-20 NOTE — Telephone Encounter (Signed)
Sara Medina, daughter of the patient would like to know if she would be allowed to come in with the patient to her appointment. The patient is hard of hearing and will need someone to be with her to take notes and be an extra set of ears.   Please let the daughter know what the office decides

## 2019-08-20 NOTE — Telephone Encounter (Signed)
Patient's daughter will come with patient for visit.  Notes updated.

## 2019-08-22 ENCOUNTER — Encounter: Payer: Self-pay | Admitting: Internal Medicine

## 2019-08-22 ENCOUNTER — Other Ambulatory Visit: Payer: Self-pay

## 2019-08-22 ENCOUNTER — Ambulatory Visit (INDEPENDENT_AMBULATORY_CARE_PROVIDER_SITE_OTHER): Payer: Medicare Other | Admitting: Internal Medicine

## 2019-08-22 VITALS — BP 128/74 | HR 77 | Ht 63.0 in | Wt 143.6 lb

## 2019-08-22 DIAGNOSIS — R0602 Shortness of breath: Secondary | ICD-10-CM

## 2019-08-22 DIAGNOSIS — R0789 Other chest pain: Secondary | ICD-10-CM | POA: Diagnosis not present

## 2019-08-22 DIAGNOSIS — R002 Palpitations: Secondary | ICD-10-CM | POA: Diagnosis not present

## 2019-08-22 NOTE — Progress Notes (Signed)
Cardiology Office Note   Date:  08/22/2019   ID:  Sara Medina, DOB 1930/04/11, MRN [id number]  PCP:  Eulas Post, MD  Cardiologist:   Dorris Carnes, MD   Patient is referred by Dr Elease Hashimoto for evaluation of chst pressure     History of Present Illness: Sara Medina is a 86 y.o. female with no known CAD  SHe previously lived in [place]   Was seen in 67s by a cardiologist for palpitations  Underwent testing (stess test)    Told heart was fine   Was told she probably had exercise induced asthma   Used inhaler prn    Since moving here to [place] she had an echo done in 2016   This showed normal LVEF , Gr I diastolic dysfunction.  Mild MR  Mild TR.   Monitor also done showed normal SR with PACs  The pt was seen in [facility] ED on 3/22  Complained of SOB   Episodes began a couple weeks prior.  Night before seen was worse  ALso noted tightness in chest   Denies wheezing    Trop x 2 were negative .   She was sent home on Prednisone 40 mg x 3 days      Since taking steroid she says she felt much better   No chest pressure   Feeling good  She denies palpitations   Current Meds  Medication Sig  . albuterol (VENTOLIN HFA) 108 (90 Base) MCG/ACT inhaler INHALE TWO PUFFS BY MOUTH EVERY 6 HOURS AS NEEDED FOR WHEEZING OR FOR SHORTNESS OF BREATH (Patient taking differently: Inhale 2 puffs into the lungs every 6 (six) hours as needed for wheezing or shortness of breath (or exercise-induced asthma symptoms). )  . amLODipine (NORVASC) 5 MG tablet TAKE ONE TABLET BY MOUTH DAILY; MAKE DOCTOR'S APPOINTMENT FOR FURTHER REFILLS (Patient taking differently: Take 5 mg by mouth daily. )  . aspirin 81 MG tablet Take 81 mg by mouth daily.    Marland Kitchen CALCIUM PO Take 1 capsule by mouth daily.  . Cholecalciferol (VITAMIN D-3) 25 MCG (1000 UT) CAPS Take 1,000 Units by mouth daily.  Marland Kitchen EPINEPHrine (EPIPEN 2-PAK) 0.3 mg/0.3 mL IJ SOAJ injection Inject 0.3 mg into the muscle as needed for anaphylaxis.  . famotidine (PEPCID) 20 MG  tablet Take 20 mg by mouth daily as needed (for GERD).   . folic acid (FOLVITE) A999333 MCG tablet Take 400 mcg by mouth 2 (two) times daily.  Marland Kitchen levothyroxine (SYNTHROID) 25 MCG tablet TAKE ONE TABLET BY MOUTH DAILY BEFORE BREAKFAST; MAKE DOCTOR BURCHETTE'S APPOINTMENT FOR FURTHER REFILLS ! (Patient taking differently: Take 25 mcg by mouth daily before breakfast. )  . metoprolol tartrate (LOPRESSOR) 25 MG tablet TAKE 2 TABLETS BY MOUTH EVERY MORNING AND TAKE 1 TABLET EVERY EVENING (Patient taking differently: Take 25-50 mg by mouth See admin instructions. Take 50 mg by mouth in the morning and 25 mg at bedtime)  . predniSONE (DELTASONE) 20 MG tablet Take 2 tablets (40 mg total) by mouth daily.  . Probiotic Product (ALIGN) 4 MG CAPS Take 4 mg by mouth daily.  Marland Kitchen pyridoxine (B-6) 100 MG tablet Take 100 mg by mouth daily.  . vitamin B-12 (CYANOCOBALAMIN) 500 MCG tablet Take 1,000 mcg by mouth 2 (two) times daily.    Current Facility-Administered Medications for the 08/22/19 encounter (Office Visit) with Fay Records, MD  Medication  . 0.9 %  sodium chloride infusion  . cloNIDine (CATAPRES) tablet 0.1 mg  Allergies:   Other, Peanut-containing drug products, Pecan nut (diagnostic), and Penicillins   Past Medical History:  Diagnosis Date  . ALLERGIC RHINITIS 05/04/2010   Qualifier: Diagnosis of  By: Jimmye Norman, LPN, [place] [facility]   . Arthritis   . Asthma   . Asthma, exercise induced 08/18/2010    She uses the Proventil inhaler on rare occasions not on a daily basis   . CARDIAC ARRHYTHMIA 05/04/2010   Qualifier: Diagnosis of  By: Jimmye Norman, LPN, [place] Cunas   . Chest tightness or pressure 05/13/2013  . Colon polyps   . COLONIC POLYPS 05/04/2010   Qualifier: Diagnosis of  By: Jimmye Norman, LPN, [place] Cystocele 10/02/2014  . Depression   . DIVERTICULITIS, HX OF 05/04/2010   Qualifier: Diagnosis of  By: Jimmye Norman, LPN, [place] Cunas   . Diverticulosis   . DIVERTICULOSIS, COLON 05/04/2010    Qualifier: Diagnosis of  By: Jimmye Norman, LPN, [place] Cunas   . Essential hypertension 05/04/2010   Qualifier: Diagnosis of  By: Jimmye Norman, LPN, [place] Cunas   . GERD 05/04/2010   Qualifier: Diagnosis of  By: Jimmye Norman, LPN, [place] Cunas   . GERD (gastroesophageal reflux disease)   . History of cardiovascular disorder 05/04/2010   Qualifier: Diagnosis of  By: Arnoldo Morale MD, Balinda Quails   . History of diverticulitis of colon   . Hyperlipidemia   . HYPERLIPIDEMIA 05/04/2010   Qualifier: Diagnosis of  By: Jimmye Norman, LPN, [place] Cunas   . Hypertension   . Hypothyroidism 05/04/2010   Qualifier: Diagnosis of  By: Jimmye Norman, LPN, [place] Cunas   . Menopausal disorder 08/18/2010   When she went off the HRT she had severe mood disorder   . Mitral valve prolapse   . Osteoarthritis 05/04/2010   Qualifier: Diagnosis of  By: Jimmye Norman, LPN, [place] Cunas   . Palpitations 06/30/2014  . Thyroid disease     Past Surgical History:  Procedure Laterality Date  . ABDOMINAL HYSTERECTOMY    . SHOULDER SURGERY Right   . TONSILLECTOMY    . trigger thumb Right      Social History:  The patient  reports that she has never smoked. She has never used smokeless tobacco. She reports current alcohol use. She reports that she does not use drugs.   Family History:  The patient's family history includes Breast cancer in her maternal aunt; Cancer in her sister; Heart disease in her father; Ovarian cancer in her mother; Stroke in her father.    ROS:  Please see the history of present illness. All other systems are reviewed and  Negative to the above problem except as noted.    PHYSICAL EXAM: VS:  BP 128/74   Pulse 77   Ht 5\' 3"  (1.6 m)   Wt 143 lb 9.6 oz (65.1 kg)   SpO2 95%   BMI 25.44 kg/m   GEN: Well nourished, well developed, in no acute distress  HEENT: normal  Neck: no JVD, carotid bruitd Cardiac: RRR; no murmurs, rubs, or gallops,no edema  Respiratory:  clear to auscultation bilaterally,  Decreased aiflow on forced expiration GI: soft,  nontender, nondistended, + BS  No hepatomegaly  MS: no deformity Moving all extremities   Skin: warm and dry, no rash Neuro:  Strength and sensation are intact Psych: euthymic mood, full affect   EKG:  EKG is not ordered today.  On 08/19/19:  SR   First degree AV block   Q waves V1/V2     Lipid Panel  Component Value Date/Time   CHOL 210 (H) 04/01/2014 0825   TRIG 127.0 04/01/2014 0825   HDL 53.50 04/01/2014 0825   CHOLHDL 4 04/01/2014 0825   VLDL 25.4 04/01/2014 0825   LDLCALC 131 (H) 04/01/2014 0825   LDLDIRECT 162.3 03/18/2013 0811      Wt Readings from Last 3 Encounters:  08/22/19 143 lb 9.6 oz (65.1 kg)  08/19/19 135 lb (61.2 kg)  11/13/18 129 lb 12.8 oz (58.9 kg)      ASSESSMENT AND PLAN:  1  Chest pressure;  Pt with spells that finally took her to ED on 3/22   Trop x 2 neg  CT neg for  PE  CT also signif for scattered coronary calcifications.  This goes against CAD as cause for episodes   The pt was given Rx for prednisone for 3 days and says her symptoms have gone away, breathing good. Again, going against CAD  On exam today the pt hs  no wheezes but forced expiration appears decreased  I would recomm echo to eval LV systolic and diastolic function (CT scan did suggest cardiomegaly) I would also set the pt up for PFTs    2  HCM  Will set the pt up for lipids today    Will set the pt up for : BMET, BNP and TSH      F/U based on test results    Current medicines are reviewed at length with the patient today.  The patient does not have concerns regarding medicines.  Signed, Dorris Carnes, MD  08/22/2019 3:31 PM    [facility] [place], [address], [place]  [address] Phone: [phone]; Fax: [phone]

## 2019-08-22 NOTE — Patient Instructions (Signed)
Medication Instructions:  No changes *If you need a refill on your cardiac medications before your next appointment, please call your pharmacy*  Lab Work: Today: bmet, bnp, lipids, tsh If you have labs (blood work) drawn today and your tests are completely normal, you will receive your results only by: Marland Kitchen MyChart Message (if you have MyChart) OR . A paper copy in the mail If you have any lab test that is abnormal or we need to change your treatment, we will call you to review the results.   Testing/Procedures: Your physician has recommended that you have a pulmonary function test. Pulmonary Function Tests are a group of tests that measure how well air moves in and out of your lungs.  Your physician has requested that you have an echocardiogram. Echocardiography is a painless test that uses sound waves to create images of your heart. It provides your doctor with information about the size and shape of your heart and how well your heart's chambers and valves are working. This procedure takes approximately one hour. There are no restrictions for this procedure.     Follow-Up: Follow up with your physician will depend on test results.  Other Instructions

## 2019-08-23 LAB — BASIC METABOLIC PANEL
BUN/Creatinine Ratio: 24 (ref 12–28)
BUN: 23 mg/dL (ref 8–27)
CO2: 23 mmol/L (ref 20–29)
Calcium: 10.5 mg/dL — ABNORMAL HIGH (ref 8.7–10.3)
Chloride: 100 mmol/L (ref 96–106)
Creatinine, Ser: 0.95 mg/dL (ref 0.57–1.00)
GFR calc Af Amer: 62 mL/min/{1.73_m2} (ref >59)
GFR calc non Af Amer: 54 mL/min/{1.73_m2} — ABNORMAL LOW (ref >59)
Glucose: 91 mg/dL (ref 65–99)
Potassium: 4.4 mmol/L (ref 3.5–5.2)
Sodium: 137 mmol/L (ref 134–144)

## 2019-08-23 LAB — TSH: TSH: 3.81 u[IU]/mL (ref 0.450–4.500)

## 2019-08-23 LAB — LIPID PANEL
Chol/HDL Ratio: 3.3 ratio (ref 0.0–4.4)
Cholesterol, Total: 227 mg/dL — ABNORMAL HIGH (ref 100–199)
HDL: 68 mg/dL (ref >39)
LDL Chol Calc (NIH): 116 mg/dL — ABNORMAL HIGH (ref 0–99)
Triglycerides: 251 mg/dL — ABNORMAL HIGH (ref 0–149)
VLDL Cholesterol Cal: 43 mg/dL — ABNORMAL HIGH (ref 5–40)

## 2019-08-23 LAB — PRO B NATRIURETIC PEPTIDE: NT-Pro BNP: 404 pg/mL (ref 0–738)

## 2019-08-24 ENCOUNTER — Other Ambulatory Visit: Payer: Self-pay | Admitting: Family Medicine

## 2019-08-24 ENCOUNTER — Other Ambulatory Visit (HOSPITAL_COMMUNITY)
Admission: RE | Admit: 2019-08-24 | Discharge: 2019-08-24 | Disposition: A | Discharge status: Home / Self Care | Payer: Medicare Other | Source: Ambulatory Visit | Attending: Cardiology | Admitting: Cardiology

## 2019-08-24 DIAGNOSIS — Z01812 Encounter for preprocedural laboratory examination: Secondary | ICD-10-CM | POA: Insufficient documentation

## 2019-08-24 DIAGNOSIS — Z20822 Contact with and (suspected) exposure to covid-19: Secondary | ICD-10-CM | POA: Diagnosis not present

## 2019-08-24 LAB — SARS CORONAVIRUS 2 (TAT 6-24 HRS): SARS Coronavirus 2: NEGATIVE

## 2019-08-27 ENCOUNTER — Other Ambulatory Visit: Payer: Self-pay

## 2019-08-27 ENCOUNTER — Telehealth: Payer: Self-pay | Admitting: Internal Medicine

## 2019-08-27 ENCOUNTER — Ambulatory Visit (INDEPENDENT_AMBULATORY_CARE_PROVIDER_SITE_OTHER): Payer: Medicare Other | Admitting: Internal Medicine

## 2019-08-27 DIAGNOSIS — R0602 Shortness of breath: Secondary | ICD-10-CM | POA: Diagnosis not present

## 2019-08-27 DIAGNOSIS — R0789 Other chest pain: Secondary | ICD-10-CM

## 2019-08-27 LAB — PULMONARY FUNCTION TEST
DL/VA % pred: 81 %
DL/VA: 3.29 ml/min/mmHg/L
DLCO cor % pred: 79 %
DLCO cor: 14.57 ml/min/mmHg
DLCO unc % pred: 79 %
DLCO unc: 14.57 ml/min/mmHg
FEF 25-75 Post: 1.52 L/sec
FEF 25-75 Pre: 1.4 L/sec
FEF2575-%Change-Post: 8 %
FEF2575-%Pred-Post: 145 %
FEF2575-%Pred-Pre: 133 %
FEV1-%Change-Post: 0 %
FEV1-%Pred-Post: 129 %
FEV1-%Pred-Pre: 128 %
FEV1-Post: 2.19 L
FEV1-Pre: 2.18 L
FEV1FVC-%Change-Post: 4 %
FEV1FVC-%Pred-Pre: 101 %
FEV6-%Change-Post: -2 %
FEV6-%Pred-Post: 130 %
FEV6-%Pred-Pre: 134 %
FEV6-Post: 2.81 L
FEV6-Pre: 2.89 L
FEV6FVC-%Change-Post: 0 %
FEV6FVC-%Pred-Post: 105 %
FEV6FVC-%Pred-Pre: 104 %
FVC-%Change-Post: -3 %
FVC-%Pred-Post: 124 %
FVC-%Pred-Pre: 128 %
FVC-Post: 2.85 L
FVC-Pre: 2.95 L
Post FEV1/FVC ratio: 77 %
Post FEV6/FVC ratio: 98 %
Pre FEV1/FVC ratio: 74 %
Pre FEV6/FVC Ratio: 98 %
RV % pred: 71 %
RV: 1.81 L
TLC % pred: 90 %
TLC: 4.6 L

## 2019-08-27 NOTE — Progress Notes (Signed)
PFT done today. 

## 2019-08-27 NOTE — Telephone Encounter (Signed)
See lab results for more details.

## 2019-08-27 NOTE — Telephone Encounter (Signed)
New message ° ° °Patient is returning call for lab results. Please call. °

## 2019-08-29 DIAGNOSIS — M5136 Other intervertebral disc degeneration, lumbar region: Secondary | ICD-10-CM | POA: Diagnosis not present

## 2019-09-04 ENCOUNTER — Ambulatory Visit (HOSPITAL_COMMUNITY): Payer: Medicare Other | Attending: Cardiology

## 2019-09-04 ENCOUNTER — Other Ambulatory Visit: Payer: Self-pay

## 2019-09-04 DIAGNOSIS — R0602 Shortness of breath: Secondary | ICD-10-CM | POA: Insufficient documentation

## 2019-09-04 DIAGNOSIS — R0789 Other chest pain: Secondary | ICD-10-CM | POA: Insufficient documentation

## 2019-09-06 ENCOUNTER — Telehealth: Payer: Self-pay | Admitting: *Deleted

## 2019-09-06 DIAGNOSIS — R0602 Shortness of breath: Secondary | ICD-10-CM

## 2019-09-06 DIAGNOSIS — R059 Cough, unspecified: Secondary | ICD-10-CM

## 2019-09-06 DIAGNOSIS — R05 Cough: Secondary | ICD-10-CM

## 2019-09-06 MED ORDER — PANTOPRAZOLE SODIUM 40 MG PO TBEC: 40.0000 mg | DELAYED_RELEASE_TABLET | Freq: Every day | ORAL | 6 refills | Status: DC

## 2019-09-06 NOTE — Telephone Encounter (Signed)
Called patient on 09/05/19 with PFT result from Dr. Harrington Challenger.  Patient informed. She reports ongoing coughing, dry, non productive a lot, can cause chest to hurt and throat to hurt. She has intermittent hoarseness. She gets SOB from this. Feels this is a chronic thing and it is really bothering her. Not every day. Has tried cough suppressant, lozenges which help for 1/2 hour. Would like to make sure it is not something more serious and if not, would like to try to get rid of the symptoms because they are not comfortable. Aware that Dr. Harrington Challenger may order a pulmonary consult as noted. Adv I will forward to her for review and will place order for pulmonary consult unless she has different recommendations  Message from Dr. Harrington Challenger: I would refer t pulmonary   Also, I would start PPI Protonix 40 mg to see if represents reflux   The patient has been informed and will start Protonix.  Pepcid on med list.  She has used that in past as needed for "stomach issues".  Will not take while on Protonix.

## 2019-09-07 ENCOUNTER — Other Ambulatory Visit: Payer: Self-pay | Admitting: Family Medicine

## 2019-09-07 DIAGNOSIS — I10 Essential (primary) hypertension: Secondary | ICD-10-CM

## 2019-09-10 ENCOUNTER — Encounter: Payer: Self-pay | Admitting: Family Medicine

## 2019-09-10 ENCOUNTER — Other Ambulatory Visit: Payer: Self-pay

## 2019-09-10 ENCOUNTER — Telehealth: Payer: Self-pay | Admitting: Internal Medicine

## 2019-09-10 ENCOUNTER — Ambulatory Visit (INDEPENDENT_AMBULATORY_CARE_PROVIDER_SITE_OTHER): Payer: Medicare Other | Admitting: Family Medicine

## 2019-09-10 VITALS — BP 146/62 | HR 86 | Temp 98.3°F | Wt 144.3 lb

## 2019-09-10 DIAGNOSIS — E039 Hypothyroidism, unspecified: Secondary | ICD-10-CM | POA: Diagnosis not present

## 2019-09-10 DIAGNOSIS — I10 Essential (primary) hypertension: Secondary | ICD-10-CM | POA: Diagnosis not present

## 2019-09-10 DIAGNOSIS — R0600 Dyspnea, unspecified: Secondary | ICD-10-CM

## 2019-09-10 MED ORDER — AMLODIPINE BESYLATE 5 MG PO TABS: ORAL_TABLET | ORAL | 3 refills | Status: DC

## 2019-09-10 MED ORDER — METOPROLOL TARTRATE 25 MG PO TABS: ORAL_TABLET | ORAL | 3 refills | Status: DC

## 2019-09-10 MED ORDER — LEVOTHYROXINE SODIUM 25 MCG PO TABS: ORAL_TABLET | ORAL | 3 refills | Status: DC

## 2019-09-10 NOTE — Telephone Encounter (Signed)
I spoke to the patient's daughter who would like a call from [place], who spoke to the patient.  There is some confusion about an inhaler in the conversation, but I did not see that in the conversation notes.  I told her that I will have Michalene reach out to her on 4/14.  She was fine with that.

## 2019-09-10 NOTE — Telephone Encounter (Signed)
Daughter of the patient called and wanted to speak with Michalene. The patient thinks she spoke with Michalene about an inhaler but the patient was not able to remember what was discussed when relaying information to her Daughter.  Please call the Daughter for further clarification

## 2019-09-10 NOTE — Progress Notes (Signed)
Subjective:     Patient ID: Sara Medina, female   DOB: [birth year], 86 y.o.   MRN: [id number]  HPI Ms. Bode is seen for routine medical follow-up.  She has been very isolated for the most part during the past year at [employer].  She has been exercising less and eating more and has gained about 15 pounds.  She had had some gradual increased dyspnea and went to the ER for this on 22 March.  She had CT angiogram that was normal.  EKG unremarkable.  Troponins were negative.  Labs unremarkable.  She was prescribed prednisone but not clear that she ever took this. No cough or wheezing.  Since ER visit she has had echocardiogram that showed normal ejection fraction with no major valvular issues and she had pulmonary function test.  She has pending follow-up with pulmonary.  She has hypertension treated with amlodipine and metoprolol.  She has hypothyroidism on levothyroxine.  She had recent TSH done with her labs in the ER which was normal.  Home blood pressures consistently [id number] systolic [id number].  Her diastolics have been normal range.  She has lumbar stenosis and that has limited her activity somewhat during the past year.  She is getting injections about every 4 months which helps temporarily.  She is reluctant to consider surgery options.  Past Medical History:  Diagnosis Date  . ALLERGIC RHINITIS 05/04/2010   Qualifier: Diagnosis of  By: Jimmye Norman, LPN, [place] [facility]   . Arthritis   . Asthma   . Asthma, exercise induced 08/18/2010    She uses the Proventil inhaler on rare occasions not on a daily basis   . CARDIAC ARRHYTHMIA 05/04/2010   Qualifier: Diagnosis of  By: Jimmye Norman, LPN, [place] Cunas   . Chest tightness or pressure 05/13/2013  . Colon polyps   . COLONIC POLYPS 05/04/2010   Qualifier: Diagnosis of  By: Jimmye Norman, LPN, [place] Cystocele 10/02/2014  . Depression   . DIVERTICULITIS, HX OF 05/04/2010   Qualifier: Diagnosis of  By: Jimmye Norman, LPN, [place] Cunas   . Diverticulosis   .  DIVERTICULOSIS, COLON 05/04/2010   Qualifier: Diagnosis of  By: Jimmye Norman, LPN, [place] Cunas   . Essential hypertension 05/04/2010   Qualifier: Diagnosis of  By: Jimmye Norman, LPN, [place]inf[place] Cunas   . GERD 05/04/2010   Qualifier: Diagnosis of  By: Jimmye Norman, LPN, Winf[place] Cunas   . GERD (gastroesophageal reflux disease)   . History of cardiovascular disorder 05/04/2010   Qualifier: Diagnosis of  By: Arnoldo Morale MD, Balinda Quails   . History of diverticulitis of colon   . Hyperlipidemia   . HYPERLIPIDEMIA 05/04/2010   Qualifier: Diagnosis of  By: Jimmye Norman, LPN, [place]inf[place] Cunas   . Hypertension   . Hypothyroidism 05/04/2010   Qualifier: Diagnosis of  By: Jimmye Norman, LPN, [place]inf[place] Cunas   . Menopausal disorder 08/18/2010   When she went off the HRT she had severe mood disorder   . Mitral valve prolapse   . Osteoarthritis 05/04/2010   Qualifier: Diagnosis of  By: Jimmye Norman, LPN, Winf[place] Cunas   . Palpitations 06/30/2014  . Thyroid disease    Past Surgical History:  Procedure Laterality Date  . ABDOMINAL HYSTERECTOMY    . SHOULDER SURGERY Right   . TONSILLECTOMY    . trigger thumb Right     reports that she has never smoked. She has never used smokeless tobacco. She reports current alcohol use. She reports that she does not use drugs. family history  includes Breast cancer in her maternal aunt; Cancer in her sister; Heart disease in her father; Ovarian cancer in her mother; Stroke in her father. Allergies  Allergen Reactions  . Other Anaphylaxis, Shortness Of Breath, Swelling and Rash    ALL NUTS!!!!  . Peanut-Containing Drug Products Anaphylaxis, Shortness Of Breath, Swelling and Rash  . Pecan Nut (Diagnostic) Anaphylaxis, Shortness Of Breath and Rash  . Penicillins Swelling    Full facial swelling Did it involve swelling of the face/tongue/throat, SOB, or low BP? Yes Did it involve sudden or severe rash/hives, skin peeling, or any reaction on the inside of your mouth or nose? Yes Did you need to seek medical attention at a hospital or  doctor's office? No When did it last happen? "many years ago" If all above answers are "NO", may proceed with cephalosporin use.    Wt Readings from Last 3 Encounters:  09/10/19 144 lb 4.8 oz (65.5 kg)  08/22/19 143 lb 9.6 oz (65.1 kg)  08/19/19 135 lb (61.2 kg)     Review of Systems  Constitutional: Negative for appetite change, fatigue and unexpected weight change.  Eyes: Negative for visual disturbance.  Respiratory: Positive for shortness of breath. Negative for cough, chest tightness and wheezing.   Cardiovascular: Negative for chest pain, palpitations and leg swelling.  Gastrointestinal: Negative for abdominal pain.  Endocrine: Negative for polydipsia and polyuria.  Genitourinary: Negative for dysuria.  Neurological: Negative for dizziness, seizures, syncope, weakness, light-headedness and headaches.       Objective:   Physical Exam Vitals reviewed.  Constitutional:      Appearance: Normal appearance.  Cardiovascular:     Rate and Rhythm: Normal rate and regular rhythm.  Pulmonary:     Effort: Pulmonary effort is normal.     Breath sounds: Normal breath sounds.  Musculoskeletal:     Cervical back: Neck supple.     Right lower leg: No edema.     Left lower leg: No edema.  Neurological:     General: No focal deficit present.     Mental Status: She is alert.     Cranial Nerves: No cranial nerve deficit.        Assessment:     #1 hypertension.  Slightly elevated reading here today but consistently well controlled at home.  She has had some moderate weight gain during the past year which she attributes to change of lifestyle partly related to the pandemic and partly related to her lumbar stenosis  #2 progressive dyspnea.  Etiology unclear.  Recent labs unremarkable.  Echo and PFTs relatively unremarkable.  Pending follow-up with pulmonary  #3 hypothyroidism-at goal by recent TSH    Plan:     -Refilled her amlodipine, metoprolol, and levothyroxine -She is  encouraged to keep follow-up with pulmonary as scheduled -Monitor blood pressures and be in touch if systolic consistently over 140 -She is encouraged to try to lose a few more pounds and get back closer to her baseline weight -Plan routine follow-up in 1 year and sooner as needed  Eulas Post MD [facility]

## 2019-09-11 NOTE — Telephone Encounter (Signed)
Detailed message left (DPR) for daughter Manfred Arch that I did not discuss an inhaler with the patient.  Adv we discussed her symptoms and that Dr. Harrington Challenger recommended a pulmonary consult.  Asked that she call back if she'd like to discuss further.

## 2019-09-11 NOTE — Telephone Encounter (Signed)
Patient's daughter called back in.  Asked about referral to pulmonary and does not thing the patient received a call yet but sometimes she doesn't get to the phone in time.  Provided number for [facility] to call re: new consult appointment.   She picked up pantoprazole and her mother said she is doing better with that but is still SOB.

## 2019-10-01 ENCOUNTER — Encounter: Payer: Self-pay | Admitting: Family Medicine

## 2019-10-01 NOTE — Telephone Encounter (Signed)
Please advise 

## 2019-10-02 NOTE — Telephone Encounter (Signed)
Called pt and gave update. Pt verbalized understanding.

## 2019-10-02 NOTE — Telephone Encounter (Signed)
Pt notified of update via phone call ad Mychart. Pt verbalized understanding.

## 2019-10-11 DIAGNOSIS — G5601 Carpal tunnel syndrome, right upper limb: Secondary | ICD-10-CM | POA: Diagnosis not present

## 2019-10-14 ENCOUNTER — Other Ambulatory Visit: Payer: Self-pay

## 2019-10-14 ENCOUNTER — Ambulatory Visit (INDEPENDENT_AMBULATORY_CARE_PROVIDER_SITE_OTHER): Payer: Medicare Other | Admitting: Critical Care Medicine

## 2019-10-14 ENCOUNTER — Encounter: Payer: Self-pay | Admitting: Critical Care Medicine

## 2019-10-14 VITALS — BP 118/64 | HR 72 | Temp 97.8°F | Ht 63.5 in | Wt 140.8 lb

## 2019-10-14 DIAGNOSIS — R0600 Dyspnea, unspecified: Secondary | ICD-10-CM | POA: Diagnosis not present

## 2019-10-14 DIAGNOSIS — I5032 Chronic diastolic (congestive) heart failure: Secondary | ICD-10-CM | POA: Diagnosis not present

## 2019-10-14 DIAGNOSIS — R0609 Other forms of dyspnea: Secondary | ICD-10-CM

## 2019-10-14 DIAGNOSIS — I351 Nonrheumatic aortic (valve) insufficiency: Secondary | ICD-10-CM

## 2019-10-14 MED ORDER — FLUTICASONE-SALMETEROL 250-50 MCG/DOSE IN AEPB: 1.0000 | INHALATION_SPRAY | Freq: Two times a day (BID) | RESPIRATORY_TRACT | 5 refills | Status: DC

## 2019-10-14 NOTE — Progress Notes (Signed)
Synopsis: Referred in May 2021 for cough, shortness of breath by Fay Records, MD  Subjective:   PATIENT ID: Sara Medina GENDER: female DOB: 1930-02-25, MRN: [id number]  Chief Complaint  Patient presents with  . Consult    SOB/ dry cough, [place] induced by exercise    Sara Medina is an 86 year old woman who presents for evaluation of shortness of breath.  She has a history of sports induced asthma that she was diagnosed with while living in [place] several years ago, treated with albuterol as needed.  Her asthma worsened when she moved to [place].  She has worse symptoms when the weather is hot and humid.  She currently uses albuterol about 1 day/week with improvement in her symptoms.  Her dyspnea on exertion has been worsening over the last few months so that she is shortness of breath with walking around her house.  There is no variation in her symptoms from times of day.  She does not have shortness of breath at rest.  She has occasional wheezing with her coughing spells, but denies sputum production.  She has never smoked or vaped.  There is no family history of lung disease.  During the Covid pandemic she has gained about 15 pounds in the last year which she thinks contributes to her shortness of breath.  She is working on losing the extra weight.  Her mobility has been significantly limited due to spinal stenosis; she reports that she can barely walk now.  She has had no issues with edema, orthopnea, PND.  She has chronic gastroparesis which is managed with dietary modifications; this was diagnosed about 3 years ago.  Cardiology- Dr. Alan Ripper 08/22/19 note reviewed.     Past Medical History:  Diagnosis Date  . ALLERGIC RHINITIS 05/04/2010   Qualifier: Diagnosis of  By: Jimmye Norman, LPN, [place] [facility]   . Arthritis   . Asthma   . Asthma, exercise induced 08/18/2010    She uses the Proventil inhaler on rare occasions not on a daily basis   . CARDIAC ARRHYTHMIA 05/04/2010    Qualifier: Diagnosis of  By: Jimmye Norman, LPN, [place]   . Chest tightness or pressure 05/13/2013  . Colon polyps   . COLONIC POLYPS 05/04/2010   Qualifier: Diagnosis of  By: Jimmye Norman, LPN, [place] Cystocele 10/02/2014  . Depression   . DIVERTICULITIS, HX OF 05/04/2010   Qualifier: Diagnosis of  By: Jimmye Norman, LPN, [place] Cunas   . Diverticulosis   . DIVERTICULOSIS, COLON 05/04/2010   Qualifier: Diagnosis of  By: Jimmye Norman, LPN, [place] Cun[place]   . Essential hypertension 05/04/2010   Qualifier: Diagnosis of  By: Jimmye Norman, LPN, [place] Cunas   . GERD 05/04/2010   Qualifier: Diagnosis of  By: Jimmye Norman, LPN, [place] Cunas   . GERD (gastroesophageal reflux disease)   . History of cardiovascular disorder 05/04/2010   Qualifier: Diagnosis of  By: Arnoldo Morale MD, Balinda Quails   . History of diverticulitis of colon   . Hyperlipidemia   . HYPERLIPIDEMIA 05/04/2010   Qualifier: Diagnosis of  By: Jimmye Norman, LPN, [place] Cun[place]   . Hypertension   . Hypothyroidism 05/04/2010   Qualifier: Diagnosis of  By: Jimmye Norman, LPN, [place] Cunas   . Menopausal disorder 08/18/2010   When she went off the HRT she had severe mood disorder   . Mitral valve prolapse   . Osteoarthritis 05/04/2010   Qualifier: Diagnosis of  By: Jimmye Norman, LPN, [place] Cunas   . Palpitations  06/30/2014  . Spinal stenosis   . Thyroid disease      Family History  Problem Relation Age of Onset  . Ovarian cancer Mother   . Heart disease Father   . Stroke Father   . Allergies Father   . Cancer Sister        brain  . Breast cancer Maternal Aunt   . Colon cancer Neg Hx      Past Surgical History:  Procedure Laterality Date  . ABDOMINAL HYSTERECTOMY    . SHOULDER SURGERY Right   . TONSILLECTOMY    . trigger thumb Right   . TUBAL LIGATION      Social History   Socioeconomic History  . Marital status: Widowed    Spouse name: Not on file  . Number of children: 3  . Years of education: Not on file  . Highest education level: Not on file  Occupational History  . Occupation:  retired    Fish farm manager: RETIRED  Tobacco Use  . Smoking status: Never Smoker  . Smokeless tobacco: Never Used  Substance and Sexual Activity  . Alcohol use: Yes    Alcohol/week: 0.0 standard drinks    Comment: Couple glasses of wine per week  . Drug use: No  . Sexual activity: Not Currently  Other Topics Concern  . Not on file  Social History Narrative  . Not on file   Social Determinants of Health   Financial Resource Strain:   . Difficulty of Paying Living Expenses:   Food Insecurity:   . Worried About Charity fundraiser in the Last Year:   . Arboriculturist in the Last Year:   Transportation Needs:   . Film/video editor (Medical):   Marland Kitchen Lack of Transportation (Non-Medical):   Physical Activity:   . Days of Exercise per Week:   . Minutes of Exercise per Session:   Stress:   . Feeling of Stress :   Social Connections:   . Frequency of Communication with Friends and Family:   . Frequency of Social Gatherings with Friends and Family:   . Attends Religious Services:   . Active Member of Clubs or Organizations:   . Attends Archivist Meetings:   Marland Kitchen Marital Status:   Intimate Partner Violence:   . Fear of Current or Ex-Partner:   . Emotionally Abused:   Marland Kitchen Physically Abused:   . Sexually Abused:      Allergies  Allergen Reactions  . Other Anaphylaxis, Shortness Of Breath, Swelling and Rash    ALL NUTS!!!!  . Peanut-Containing Drug Products Anaphylaxis, Shortness Of Breath, Swelling and Rash  . Pecan Nut (Diagnostic) Anaphylaxis, Shortness Of Breath and Rash  . Penicillins Swelling    Full facial swelling Did it involve swelling of the face/tongue/throat, SOB, or low BP? Yes Did it involve sudden or severe rash/hives, skin peeling, or any reaction on the inside of your mouth or nose? Yes Did you need to seek medical attention at a hospital or doctor's office? No When did it last happen? "many years ago" If all above answers are "NO", may proceed with  cephalosporin use.      Immunization History  Administered Date(s) Administered  . Influenza Split 02/18/2011, 02/27/2012  . Influenza Whole 02/23/2010  . Influenza, High Dose Seasonal PF 02/24/2014, 03/09/2015, 02/11/2016, 02/13/2017  . Influenza,inj,Quad PF,6+ Mos 02/12/2013  . Pneumococcal Polysaccharide-23 03/04/2003  . Td 03/10/2002  . Tdap 02/27/2012  . Zoster 02/02/2005  . Zoster Recombinat (Shingrix)  12/25/2017    Outpatient Medications Prior to Visit  Medication Sig Dispense Refill  . albuterol (VENTOLIN HFA) 108 (90 Base) MCG/ACT inhaler INHALE 2 PUFFS BY MOUTH EVERY 6 HOURS AS NEEDED FOR WHEEZING OR FOR SHORTNESS OF BREATH 8.5 g 0  . amLODipine (NORVASC) 5 MG tablet TAKE ONE TABLET BY MOUTH DAILY; (Patient taking differently: 10 mg. TAKE ONE TABLET BY MOUTH DAILY;) 90 tablet 3  . aspirin 81 MG tablet Take 81 mg by mouth daily.      Marland Kitchen CALCIUM PO Take 1 capsule by mouth daily.    . Cholecalciferol (VITAMIN D-3) 25 MCG (1000 UT) CAPS Take 1,000 Units by mouth daily.    Marland Kitchen EPINEPHrine (EPIPEN 2-PAK) 0.3 mg/0.3 mL IJ SOAJ injection Inject 0.3 mg into the muscle as needed for anaphylaxis.    . famotidine (PEPCID) 20 MG tablet Take 20 mg by mouth daily as needed (for GERD).     . folic acid (FOLVITE) A999333 MCG tablet Take 400 mcg by mouth 2 (two) times daily.    Marland Kitchen levothyroxine (SYNTHROID) 25 MCG tablet TAKE ONE TABLET BY MOUTH DAILY BEFORE BREAKFAST; 90 tablet 3  . metoprolol tartrate (LOPRESSOR) 25 MG tablet TAKE TWO TABLETS BY MOUTH EVERY MORNING AND TAKE ONE TABLET BY MOUTH EVERY EVENING. 270 tablet 3  . pantoprazole (PROTONIX) 40 MG tablet Take 1 tablet (40 mg total) by mouth daily. 30 tablet 6  . Probiotic Product (ALIGN) 4 MG CAPS Take 4 mg by mouth daily.    Marland Kitchen pyridoxine (B-6) 100 MG tablet Take 100 mg by mouth daily.    . vitamin B-12 (CYANOCOBALAMIN) 500 MCG tablet Take 1,000 mcg by mouth 2 (two) times daily.      Facility-Administered Medications Prior to Visit    Medication Dose Route Frequency Provider Last Rate Last Admin  . 0.9 %  sodium chloride infusion  500 mL Intravenous Continuous Nandigam, Kavitha V, MD      . cloNIDine (CATAPRES) tablet 0.1 mg  0.1 mg Oral Once Dorothyann Peng, NP        Review of Systems  Constitutional: Negative for chills and fever.  HENT: Negative for congestion.   Respiratory: Positive for cough, shortness of breath and wheezing.   Cardiovascular: Negative for chest pain and leg swelling.  Gastrointestinal: Negative for heartburn, nausea and vomiting.  Musculoskeletal: Positive for back pain.  Skin: Negative for rash.     Objective:   Vitals:   10/14/19 1102  BP: 118/64  Pulse: 72  Temp: 97.8 F (36.6 C)  TempSrc: Temporal  SpO2: 98%  Weight: 140 lb 12.8 oz (63.9 kg)  Height: 5' 3.5" (1.613 m)   98% on  RA BMI Readings from Last 3 Encounters:  10/14/19 24.55 kg/m  09/10/19 25.56 kg/m  08/22/19 25.44 kg/m   Wt Readings from Last 3 Encounters:  10/14/19 140 lb 12.8 oz (63.9 kg)  09/10/19 144 lb 4.8 oz (65.5 kg)  08/22/19 143 lb 9.6 oz (65.1 kg)    Physical Exam Vitals reviewed.  Constitutional:      General: She is not in acute distress.    Appearance: She is not ill-appearing.  HENT:     Head: Normocephalic and atraumatic.  Eyes:     General: No scleral icterus. Cardiovascular:     Rate and Rhythm: Normal rate and regular rhythm.     Comments: JVD 5 cm above the clavicle when sitting upright Pulmonary:     Comments: Bibasilar rales, otherwise clear to auscultation.  Breathing  comfortably on room air, no conversational dyspnea. Abdominal:     General: There is no distension.     Palpations: Abdomen is soft.     Tenderness: There is no abdominal tenderness.  Musculoskeletal:        General: No swelling or deformity.     Cervical back: Neck supple.  Lymphadenopathy:     Cervical: No cervical adenopathy.  Skin:    General: Skin is warm and dry.     Findings: No rash.   Neurological:     Mental Status: She is alert.     Coordination: Coordination normal.  Psychiatric:        Mood and Affect: Mood normal.        Behavior: Behavior normal.      CBC    Component Value Date/Time   WBC 8.7 08/19/2019 1608   RBC 4.08 08/19/2019 1608   HGB 13.4 08/19/2019 1608   HCT 39.6 08/19/2019 1608   PLT 318 08/19/2019 1608   MCV 97.1 08/19/2019 1608   MCH 32.8 08/19/2019 1608   MCHC 33.8 08/19/2019 1608   RDW 12.5 08/19/2019 1608   LYMPHSABS 1,554 12/30/2016 1700   MONOABS 666 12/30/2016 1700   EOSABS 74 12/30/2016 1700   BASOSABS 0 12/30/2016 1700    CHEMISTRY No results for input(s): NA, K, CL, CO2, GLUCOSE, BUN, CREATININE, CALCIUM, MG, PHOS in the last 168 hours. CrCl cannot be calculated (Patient's most recent lab result is older than the maximum 21 days allowed.).   Chest Imaging- films reviewed: CTA chest 08/19/2019-mild groundglass most notable in the dependent portions of the lungs.  Cardiomegaly, reflux of contrast from right atrium down IVC.  Dilated pulmonary veins.  Normal pulmonary artery.  No PE.  Senescent vascular calcification.  No significant mediastinal or hilar adenopathy.  Pulmonary Functions Testing Results: PFT Results Latest Ref Rng & Units 08/27/2019  FVC-Pre L 2.95  FVC-Predicted Pre % 128  FVC-Post L 2.85  FVC-Predicted Post % 124  Pre FEV1/FVC % % 74  Post FEV1/FCV % % 77  FEV1-Pre L 2.18  FEV1-Predicted Pre % 128  FEV1-Post L 2.19  DLCO UNC% % 79  DLCO COR %Predicted % 81  TLC L 4.60  TLC % Predicted % 90  RV % Predicted % 71   2021- no significant obstruction or bronchodilator reversibility.  No significant restriction.  Mildly impaired diffusion capacity.   Echocardiogram 09/04/2019: LVEF 65 to 70%, moderate concentric LVH, grade 1 diastolic dysfunction with elevated LAP.  Normal atrial size bilaterally, normal RV.  Mild MR, mild to moderate AR.      Assessment & Plan:     ICD-10-CM   1. Chronic heart failure  with preserved ejection fraction (HFpEF) (HCC)  I50.32 CANCELED: Ambulatory referral to Cardiology  2. Nonrheumatic aortic valve insufficiency  I35.1 CANCELED: Ambulatory referral to Cardiology  3. DOE (dyspnea on exertion)  R06.00 CANCELED: Ambulatory referral to Cardiology    CANCELED: Pulmonary function test    Dyspnea on exertion-unclear if this is related to weight gain, aortic regurgitation, or asthma. -PFTs reviewed.  No evidence of obstruction or bronchodilator reversibility. The only abnormality is isolated DLCO reduction, which could be more suggestive of CHF rather than asthma, which usually causes a high DLCO. I recommend trying LABA-ICS inhaler to determine if her symptoms are better controlled.  Starting Advair 250-50 twice daily.  Instructed to rinse her mouth after use.  If there is no benefit from this, this would correlate with her PFTs. -Reviewed  cardiology's notes.  If no benefit on bronchodilators, will refer back to cardiology for further evaluation of AR.  History of exercise- induced asthma -Trial of Advair 250-50 twice daily. -Continue albuterol as needed.  RTC in 2 months.    Current Outpatient Medications:  .  albuterol (VENTOLIN HFA) 108 (90 Base) MCG/ACT inhaler, INHALE 2 PUFFS BY MOUTH EVERY 6 HOURS AS NEEDED FOR WHEEZING OR FOR SHORTNESS OF BREATH, Disp: 8.5 g, Rfl: 0 .  amLODipine (NORVASC) 5 MG tablet, TAKE ONE TABLET BY MOUTH DAILY; (Patient taking differently: 10 mg. TAKE ONE TABLET BY MOUTH DAILY;), Disp: 90 tablet, Rfl: 3 .  aspirin 81 MG tablet, Take 81 mg by mouth daily.  , Disp: , Rfl:  .  CALCIUM PO, Take 1 capsule by mouth daily., Disp: , Rfl:  .  Cholecalciferol (VITAMIN D-3) 25 MCG (1000 UT) CAPS, Take 1,000 Units by mouth daily., Disp: , Rfl:  .  EPINEPHrine (EPIPEN 2-PAK) 0.3 mg/0.3 mL IJ SOAJ injection, Inject 0.3 mg into the muscle as needed for anaphylaxis., Disp: , Rfl:  .  famotidine (PEPCID) 20 MG tablet, Take 20 mg by mouth daily as needed  (for GERD). , Disp: , Rfl:  .  folic acid (FOLVITE) A999333 MCG tablet, Take 400 mcg by mouth 2 (two) times daily., Disp: , Rfl:  .  levothyroxine (SYNTHROID) 25 MCG tablet, TAKE ONE TABLET BY MOUTH DAILY BEFORE BREAKFAST;, Disp: 90 tablet, Rfl: 3 .  metoprolol tartrate (LOPRESSOR) 25 MG tablet, TAKE TWO TABLETS BY MOUTH EVERY MORNING AND TAKE ONE TABLET BY MOUTH EVERY EVENING., Disp: 270 tablet, Rfl: 3 .  pantoprazole (PROTONIX) 40 MG tablet, Take 1 tablet (40 mg total) by mouth daily., Disp: 30 tablet, Rfl: 6 .  Probiotic Product (ALIGN) 4 MG CAPS, Take 4 mg by mouth daily., Disp: , Rfl:  .  pyridoxine (B-6) 100 MG tablet, Take 100 mg by mouth daily., Disp: , Rfl:  .  vitamin B-12 (CYANOCOBALAMIN) 500 MCG tablet, Take 1,000 mcg by mouth 2 (two) times daily. , Disp: , Rfl:  .  Fluticasone-Salmeterol (ADVAIR DISKUS) 250-50 MCG/DOSE AEPB, Inhale 1 puff into the lungs 2 (two) times daily., Disp: 60 each, Rfl: 5  Current Facility-Administered Medications:  .  0.9 %  sodium chloride infusion, 500 mL, Intravenous, Continuous, Nandigam, Kavitha V, MD .  cloNIDine (CATAPRES) tablet 0.1 mg, 0.1 mg, Oral, Once, Nafziger, Tommi Rumps, NP     Julian Hy, DO [facility] 10/14/2019 1:18 PM

## 2019-10-14 NOTE — Patient Instructions (Addendum)
Thank you for visiting Dr. Carlis Abbott at [facility]. We recommend the following:   Meds ordered this encounter  Medications  . Fluticasone-Salmeterol (ADVAIR DISKUS) 250-50 MCG/DOSE AEPB    Sig: Inhale 1 puff into the lungs 2 (two) times daily.    Dispense:  60 each    Refill:  5    Rinse your mouth after every use. You can still use albuterol as needed.  Return in about 2 months (around 12/14/2019).    Please do your part to reduce the spread of COVID-19.

## 2019-10-18 ENCOUNTER — Telehealth: Payer: Self-pay | Admitting: Internal Medicine

## 2019-10-18 NOTE — Telephone Encounter (Signed)
Spoke to pt's daughter on telephone. Pt seen in Pulmonary by Dr Carlis Abbott PFTs reviewed  When I saw the pt earlier this spring I was impressed that she felt great with no SOB after a short course of prednisone.   This goes against a structural heart issue.   Volume as OK on exam when I saw her  Echo showed mild MR, mild/mod AI   Mild diastolic dysfunction   I do not think valvular issues are severe enough to be causing SOB  I told daughter to go ahead with Advair trial.    Will send copy of phone note to pulmonary.

## 2019-10-21 NOTE — Telephone Encounter (Signed)
Thanks for clarifying. I was unclear based on her PCP note whether or not she had taken it. Regardless, a trial of ICS-LABA is warranted.  LPC

## 2019-11-04 ENCOUNTER — Other Ambulatory Visit: Payer: Self-pay

## 2019-11-04 ENCOUNTER — Encounter: Payer: Self-pay | Admitting: Family Medicine

## 2019-11-04 ENCOUNTER — Ambulatory Visit (INDEPENDENT_AMBULATORY_CARE_PROVIDER_SITE_OTHER): Payer: Medicare Other | Admitting: Family Medicine

## 2019-11-04 VITALS — BP 122/64 | HR 82 | Temp 98.2°F | Wt 138.5 lb

## 2019-11-04 DIAGNOSIS — I10 Essential (primary) hypertension: Secondary | ICD-10-CM

## 2019-11-04 DIAGNOSIS — E039 Hypothyroidism, unspecified: Secondary | ICD-10-CM | POA: Diagnosis not present

## 2019-11-04 DIAGNOSIS — H903 Sensorineural hearing loss, bilateral: Secondary | ICD-10-CM

## 2019-11-04 MED ORDER — AMLODIPINE BESYLATE 5 MG PO TABS: ORAL_TABLET | ORAL | 3 refills | Status: DC

## 2019-11-04 NOTE — Patient Instructions (Signed)
Continue with weight loss efforts.  Let's plan on 6 month follow up.    If BP drops any further and you experience dizziness we might need to back off the Amlodipine dose.

## 2019-11-04 NOTE — Progress Notes (Signed)
Subjective:     Patient ID: Sara Medina, female   DOB: November 25, 1929, 86 y.o.   MRN: [id number]  HPI Mrs Ryland is seen for follow-up of hypertension.  She had gained quite a bit of weight during the past year and has now made some dietary changes and has already lost about 6 pounds.  We had titrated her up her amlodipine to 5 mg 2 tablets daily and she brings in log of readings.  Her readings have improved dramatically since then.  She is averaging mostly 644I systolic and 34V or so diastolic.  No dizziness.  No orthostatic symptoms.  She has increased her activity level since last visit as well.  She had some increased dyspnea and has had fairly extensive cardiac and pulmonary work-up.  She is now taking Advair.  No recent cough or wheeze.  She has hypothyroidism.  She is on low-dose replacement.  TSH in March was normal and at goal.  Past Medical History:  Diagnosis Date  . ALLERGIC RHINITIS 05/04/2010   Qualifier: Diagnosis of  By: Jimmye Norman, LPN, [place] [facility]   . Arthritis   . Asthma   . Asthma, exercise induced 08/18/2010    She uses the Proventil inhaler on rare occasions not on a daily basis   . CARDIAC ARRHYTHMIA 05/04/2010   Qualifier: Diagnosis of  By: Jimmye Norman, LPN, [place] Cunas   . Chest tightness or pressure 05/13/2013  . Colon polyps   . COLONIC POLYPS 05/04/2010   Qualifier: Diagnosis of  By: Jimmye Norman, LPN, [place] Cystocele 10/02/2014  . Depression   . DIVERTICULITIS, HX OF 05/04/2010   Qualifier: Diagnosis of  By: Jimmye Norman, LPN, [place] Cunas   . Diverticulosis   . DIVERTICULOSIS, COLON 05/04/2010   Qualifier: Diagnosis of  By: Jimmye Norman, LPN, [place] Cunas   . Essential hypertension 05/04/2010   Qualifier: Diagnosis of  By: Jimmye Norman, LPN, [place] Cunas   . GERD 05/04/2010   Qualifier: Diagnosis of  By: Jimmye Norman, LPN, [place] Cunas   . GERD (gastroesophageal reflux disease)   . History of cardiovascular disorder 05/04/2010   Qualifier: Diagnosis of  By: Arnoldo Morale MD, Balinda Quails   . History  of diverticulitis of colon   . Hyperlipidemia   . HYPERLIPIDEMIA 05/04/2010   Qualifier: Diagnosis of  By: Jimmye Norman, LPN, [place] Cunas   . Hypertension   . Hypothyroidism 05/04/2010   Qualifier: Diagnosis of  By: Jimmye Norman, LPN, [place] Cunas   . Menopausal disorder 08/18/2010   When she went off the HRT she had severe mood disorder   . Mitral valve prolapse   . Osteoarthritis 05/04/2010   Qualifier: Diagnosis of  By: Jimmye Norman, LPN, [place] Cunas   . Palpitations 06/30/2014  . Spinal stenosis   . Thyroid disease    Past Surgical History:  Procedure Laterality Date  . ABDOMINAL HYSTERECTOMY    . SHOULDER SURGERY Right   . TONSILLECTOMY    . trigger thumb Right   . TUBAL LIGATION      reports that she has never smoked. She has never used smokeless tobacco. She reports current alcohol use. She reports that she does not use drugs. family history includes Allergies in her father; Breast cancer in her maternal aunt; Cancer in her sister; Heart disease in her father; Ovarian cancer in her mother; Stroke in her father. Allergies  Allergen Reactions  . Other Anaphylaxis, Shortness Of Breath, Swelling and Rash    ALL NUTS!!!!  . Peanut-Containing Drug Products  Anaphylaxis, Shortness Of Breath, Swelling and Rash  . Pecan Nut (Diagnostic) Anaphylaxis, Shortness Of Breath and Rash  . Penicillins Swelling    Full facial swelling Did it involve swelling of the face/tongue/throat, SOB, or low BP? Yes Did it involve sudden or severe rash/hives, skin peeling, or any reaction on the inside of your mouth or nose? Yes Did you need to seek medical attention at a hospital or doctor's office? No When did it last happen? "many years ago" If all above answers are "NO", may proceed with cephalosporin use.      Review of Systems  Constitutional: Negative for fatigue and unexpected weight change.  Eyes: Negative for visual disturbance.  Respiratory: Negative for cough, chest tightness, shortness of breath and wheezing.    Cardiovascular: Negative for chest pain, palpitations and leg swelling.  Endocrine: Negative for polydipsia and polyuria.  Neurological: Negative for dizziness, seizures, syncope, weakness, light-headedness and headaches.       Objective:   Physical Exam Constitutional:      Appearance: She is well-developed.  Neck:     Thyroid: No thyromegaly.     Vascular: No JVD.  Cardiovascular:     Rate and Rhythm: Normal rate and regular rhythm.     Heart sounds: No gallop.   Pulmonary:     Effort: Pulmonary effort is normal. No respiratory distress.     Breath sounds: Normal breath sounds. No wheezing or rales.  Musculoskeletal:     Cervical back: Neck supple.     Right lower leg: No edema.     Left lower leg: No edema.  Neurological:     Mental Status: She is alert.        Assessment:     #1 Hypertension improved and now at goal  #2 history of chronic bilateral sensorineural hearing loss.  She is requesting a letter of medical necessity for her hearing aid  #3 hypothyroidism-stable by recent labs    Plan:     -Refilled amlodipine for total 10 mg daily  -Continue weight loss efforts.  She hopes to lose another 8 to 10 pounds.  -Routine follow-up in 6 months and sooner as needed  -Letter of medical necessity provided for patient for hearing aids  Eulas Post MD [facility] at [facility]

## 2019-11-05 DIAGNOSIS — H903 Sensorineural hearing loss, bilateral: Secondary | ICD-10-CM | POA: Diagnosis not present

## 2019-11-05 DIAGNOSIS — Z822 Family history of deafness and hearing loss: Secondary | ICD-10-CM | POA: Diagnosis not present

## 2019-11-19 DIAGNOSIS — H903 Sensorineural hearing loss, bilateral: Secondary | ICD-10-CM | POA: Diagnosis not present

## 2019-12-04 ENCOUNTER — Other Ambulatory Visit: Payer: Self-pay | Admitting: Family Medicine

## 2019-12-04 DIAGNOSIS — Z1231 Encounter for screening mammogram for malignant neoplasm of breast: Secondary | ICD-10-CM

## 2019-12-10 ENCOUNTER — Other Ambulatory Visit: Payer: Self-pay

## 2019-12-10 ENCOUNTER — Encounter: Payer: Self-pay | Admitting: Critical Care Medicine

## 2019-12-10 ENCOUNTER — Ambulatory Visit (INDEPENDENT_AMBULATORY_CARE_PROVIDER_SITE_OTHER): Payer: Medicare Other | Admitting: Critical Care Medicine

## 2019-12-10 VITALS — BP 140/70 | HR 75 | Temp 98.1°F | Ht 63.5 in | Wt 137.8 lb

## 2019-12-10 DIAGNOSIS — J4540 Moderate persistent asthma, uncomplicated: Secondary | ICD-10-CM | POA: Diagnosis not present

## 2019-12-10 MED ORDER — AZELASTINE HCL 0.1 % NA SOLN: 2.0000 | Freq: Two times a day (BID) | NASAL | 12 refills | Status: DC

## 2019-12-10 NOTE — Patient Instructions (Addendum)
Thank you for visiting Dr. Carlis Abbott at [facility]. We recommend the following:   Meds ordered this encounter  Medications  . azelastine (ASTELIN) 0.1 % nasal spray    Sig: Place 2 sprays into both nostrils 2 (two) times daily. Use in each nostril as directed    Dispense:  30 mL    Refill:  12    Return in about 3 months (around 03/11/2020).    Please do your part to reduce the spread of COVID-19.

## 2019-12-10 NOTE — Progress Notes (Signed)
Synopsis: Referred in May 2021 for cough, shortness of breath by Eulas Post, MD  Subjective:   PATIENT ID: Sara Medina GENDER: female DOB: 08/19/1929, MRN: [id number]  Chief Complaint  Patient presents with  . Follow-up    Patient is feelign better since last visit. She was started on Advair last visit and has noticed a difference. She is having some side effects such as dry cough, having to clear throat, hoarseness in her voice    Sara Medina is an 86 year old woman who presents for follow-up of shortness of breath.  She is accompanied today by her daughter.  She has had induced asthma for many years and was previously not on scheduled inhalers.  She was started on Advair twice daily at her last visit and has since noticed she has no shortness of breath or wheezing.  She now has postnasal drip causing hoarseness, throat clearing, and cough.  She has a cat which she thinks she may be allergic to, but she does not agree to a cat.  She has had both Covid vaccines and continues to have weekly Covid surveillance testing at her senior living community.  She continues to wear her mask regularly. PCP note reviewed 11/04/2019-she recently lost 6 pounds intentionally.  She has upcoming wisdom tooth surgery planned.    OV 10/14/19: Sara Medina is an 86 year old woman who presents for evaluation of shortness of breath.  She has a history of sports induced asthma that she was diagnosed with while living in [place] several years ago, treated with albuterol as needed.  Her asthma worsened when she moved to [place].  She has worse symptoms when the weather is hot and humid.  She currently uses albuterol about 1 day/week with improvement in her symptoms.  Her dyspnea on exertion has been worsening over the last few months so that she is shortness of breath with walking around her house.  There is no variation in her symptoms from times of day.  She does not have shortness of breath at rest.  She has  occasional wheezing with her coughing spells, but denies sputum production.  She has never smoked or vaped.  There is no family history of lung disease.  During the Covid pandemic she has gained about 15 pounds in the last year which she thinks contributes to her shortness of breath.  She is working on losing the extra weight.  Her mobility has been significantly limited due to spinal stenosis; she reports that she can barely walk now.  She has had no issues with edema, orthopnea, PND.  She has chronic gastroparesis which is managed with dietary modifications; this was diagnosed about 3 years ago.  Cardiology- Dr. Alan Ripper 08/22/19 note reviewed.    Past Medical History:  Diagnosis Date  . ALLERGIC RHINITIS 05/04/2010   Qualifier: Diagnosis of  By: Jimmye Norman, LPN, [place] [facility]   . Arthritis   . Asthma   . Asthma, exercise induced 08/18/2010    She uses the Proventil inhaler on rare occasions not on a daily basis   . CARDIAC ARRHYTHMIA 05/04/2010   Qualifier: Diagnosis of  By: Jimmye Norman, LPN, [place]inf[place] Cunas   . Chest tightness or pressure 05/13/2013  . Colon polyps   . COLONIC POLYPS 05/04/2010   Qualifier: Diagnosis of  By: Jimmye Norman, LPN, [place] Cystocele 10/02/2014  . Depression   . DIVERTICULITIS, HX OF 05/04/2010   Qualifier: Diagnosis of  By: Jimmye Norman, LPN, Stevie Kern  M   . Diverticulosis   . DIVERTICULOSIS, COLON 05/04/2010   Qualifier: Diagnosis of  By: Jimmye Norman, LPN, Winfield Cunas   . Essential hypertension 05/04/2010   Qualifier: Diagnosis of  By: Jimmye Norman, LPN, Winfield Cunas   . GERD 05/04/2010   Qualifier: Diagnosis of  By: Jimmye Norman, LPN, Winfield Cunas   . GERD (gastroesophageal reflux disease)   . History of cardiovascular disorder 05/04/2010   Qualifier: Diagnosis of  By: Arnoldo Morale MD, Balinda Quails   . History of diverticulitis of colon   . Hyperlipidemia   . HYPERLIPIDEMIA 05/04/2010   Qualifier: Diagnosis of  By: Jimmye Norman, LPN, Winf[place] Cunas   . Hypertension   . Hypothyroidism 05/04/2010   Qualifier:  Diagnosis of  By: Jimmye Norman, LPN, Winf[place] Cunas   . Menopausal disorder 08/18/2010   When she went off the HRT she had severe mood disorder   . Mitral valve prolapse   . Osteoarthritis 05/04/2010   Qualifier: Diagnosis of  By: Jimmye Norman, LPN, [place] Cunas   . Palpitations 06/30/2014  . Spinal stenosis   . Thyroid disease      Family History  Problem Relation Age of Onset  . Ovarian cancer Mother   . Heart disease Father   . Stroke Father   . Allergies Father   . Cancer Sister        brain  . Breast cancer Maternal Aunt   . Colon cancer Neg Hx      Past Surgical History:  Procedure Laterality Date  . ABDOMINAL HYSTERECTOMY    . SHOULDER SURGERY Right   . TONSILLECTOMY    . trigger thumb Right   . TUBAL LIGATION      Social History   Socioeconomic History  . Marital status: Widowed    Spouse name: Not on file  . Number of children: 3  . Years of education: Not on file  . Highest education level: Not on file  Occupational History  . Occupation: retired    Fish farm manager: RETIRED  Tobacco Use  . Smoking status: Never Smoker  . Smokeless tobacco: Never Used  Vaping Use  . Vaping Use: Never used  Substance and Sexual Activity  . Alcohol use: Yes    Alcohol/week: 0.0 standard drinks    Comment: Couple glasses of wine per week  . Drug use: No  . Sexual activity: Not Currently  Other Topics Concern  . Not on file  Social History Narrative  . Not on file   Social Determinants of Health   Financial Resource Strain:   . Difficulty of Paying Living Expenses:   Food Insecurity:   . Worried About Charity fundraiser in the Last Year:   . Arboriculturist in the Last Year:   Transportation Needs:   . Film/video editor (Medical):   Marland Kitchen Lack of Transportation (Non-Medical):   Physical Activity:   . Days of Exercise per Week:   . Minutes of Exercise per Session:   Stress:   . Feeling of Stress :   Social Connections:   . Frequency of Communication with Friends and Family:   .  Frequency of Social Gatherings with Friends and Family:   . Attends Religious Services:   . Active Member of Clubs or Organizations:   . Attends Archivist Meetings:   Marland Kitchen Marital Status:   Intimate Partner Violence:   . Fear of Current or Ex-Partner:   . Emotionally Abused:   Marland Kitchen Physically Abused:   . Sexually Abused:  Allergies  Allergen Reactions  . Other Anaphylaxis, Shortness Of Breath, Swelling and Rash    ALL NUTS!!!!  . Peanut-Containing Drug Products Anaphylaxis, Shortness Of Breath, Swelling and Rash  . Pecan Nut (Diagnostic) Anaphylaxis, Shortness Of Breath and Rash  . Penicillins Swelling    Full facial swelling Did it involve swelling of the face/tongue/throat, SOB, or low BP? Yes Did it involve sudden or severe rash/hives, skin peeling, or any reaction on the inside of your mouth or nose? Yes Did you need to seek medical attention at a hospital or doctor's office? No When did it last happen? "many years ago" If all above answers are "NO", may proceed with cephalosporin use.      Immunization History  Administered Date(s) Administered  . Influenza Split 02/18/2011, 02/27/2012  . Influenza Whole 02/23/2010  . Influenza, High Dose Seasonal PF 02/24/2014, 03/09/2015, 02/11/2016, 02/13/2017, 02/07/2019  . Influenza,inj,Quad PF,6+ Mos 02/12/2013  . Pneumococcal Polysaccharide-23 03/04/2003  . Td 03/10/2002  . Tdap 02/27/2012  . Zoster 02/02/2005  . Zoster Recombinat (Shingrix) 10/21/2017, 12/25/2017    Outpatient Medications Prior to Visit  Medication Sig Dispense Refill  . albuterol (VENTOLIN HFA) 108 (90 Base) MCG/ACT inhaler INHALE 2 PUFFS BY MOUTH EVERY 6 HOURS AS NEEDED FOR WHEEZING OR FOR SHORTNESS OF BREATH 8.5 g 0  . amLODipine (NORVASC) 5 MG tablet Take one tablet twice daily 180 tablet 3  . aspirin 81 MG tablet Take 81 mg by mouth daily.      Marland Kitchen CALCIUM PO Take 1 capsule by mouth daily.    . Cholecalciferol (VITAMIN D-3) 25 MCG (1000 UT) CAPS  Take 1,000 Units by mouth daily.    Marland Kitchen EPINEPHrine (EPIPEN 2-PAK) 0.3 mg/0.3 mL IJ SOAJ injection Inject 0.3 mg into the muscle as needed for anaphylaxis.    . famotidine (PEPCID) 20 MG tablet Take 20 mg by mouth daily as needed (for GERD).     . Fluticasone-Salmeterol (ADVAIR DISKUS) 250-50 MCG/DOSE AEPB Inhale 1 puff into the lungs 2 (two) times daily. 60 each 5  . folic acid (FOLVITE) 163 MCG tablet Take 400 mcg by mouth 2 (two) times daily.    Marland Kitchen levothyroxine (SYNTHROID) 25 MCG tablet TAKE ONE TABLET BY MOUTH DAILY BEFORE BREAKFAST; 90 tablet 3  . metoprolol tartrate (LOPRESSOR) 25 MG tablet TAKE TWO TABLETS BY MOUTH EVERY MORNING AND TAKE ONE TABLET BY MOUTH EVERY EVENING. 270 tablet 3  . pantoprazole (PROTONIX) 40 MG tablet Take 1 tablet (40 mg total) by mouth daily. 30 tablet 6  . Probiotic Product (ALIGN) 4 MG CAPS Take 4 mg by mouth daily.    Marland Kitchen pyridoxine (B-6) 100 MG tablet Take 100 mg by mouth daily.    . vitamin B-12 (CYANOCOBALAMIN) 500 MCG tablet Take 1,000 mcg by mouth 2 (two) times daily.      Facility-Administered Medications Prior to Visit  Medication Dose Route Frequency Provider Last Rate Last Admin  . 0.9 %  sodium chloride infusion  500 mL Intravenous Continuous Nandigam, Kavitha V, MD      . cloNIDine (CATAPRES) tablet 0.1 mg  0.1 mg Oral Once Dorothyann Peng, NP        Review of Systems  Constitutional: Negative for chills and fever.  HENT: Negative for congestion.   Respiratory: Positive for cough, shortness of breath and wheezing.   Cardiovascular: Negative for chest pain and leg swelling.  Gastrointestinal: Negative for heartburn, nausea and vomiting.  Musculoskeletal: Positive for back pain.  Skin: Negative for rash.  Objective:   Vitals:   12/10/19 1200  BP: 140/70  Pulse: 75  Temp: 98.1 F (36.7 C)  TempSrc: Oral  SpO2: 97%  Weight: 137 lb 12.8 oz (62.5 kg)  Height: 5' 3.5" (1.613 m)   97% on  RA BMI Readings from Last 3 Encounters:  12/10/19  24.03 kg/m  11/04/19 24.15 kg/m  10/14/19 24.55 kg/m   Wt Readings from Last 3 Encounters:  12/10/19 137 lb 12.8 oz (62.5 kg)  11/04/19 138 lb 8 oz (62.8 kg)  10/14/19 140 lb 12.8 oz (63.9 kg)    Physical Exam Vitals reviewed.  Constitutional:      Appearance: She is not ill-appearing.  HENT:     Head: Normocephalic and atraumatic.     Mouth/Throat:     Comments: Mildly hoarse voice, occasional throat clearing. Eyes:     General: No scleral icterus. Cardiovascular:     Rate and Rhythm: Normal rate and regular rhythm.     Heart sounds: No murmur heard.   Pulmonary:     Comments: Breathing comfortably on room air, no conversational dyspnea.  Clear to auscultation bilaterally.  No observed coughing. Abdominal:     General: There is no distension.     Palpations: Abdomen is soft.     Tenderness: There is no abdominal tenderness.  Musculoskeletal:        General: No swelling or deformity.     Cervical back: Neck supple.  Lymphadenopathy:     Cervical: No cervical adenopathy.  Skin:    General: Skin is warm and dry.     Findings: No rash.  Neurological:     General: No focal deficit present.     Mental Status: She is alert.     Coordination: Coordination normal.  Psychiatric:        Mood and Affect: Mood normal.        Behavior: Behavior normal.      CBC    Component Value Date/Time   WBC 8.7 08/19/2019 1608   RBC 4.08 08/19/2019 1608   HGB 13.4 08/19/2019 1608   HCT 39.6 08/19/2019 1608   PLT 318 08/19/2019 1608   MCV 97.1 08/19/2019 1608   MCH 32.8 08/19/2019 1608   MCHC 33.8 08/19/2019 1608   RDW 12.5 08/19/2019 1608   LYMPHSABS 1,554 12/30/2016 1700   MONOABS 666 12/30/2016 1700   EOSABS 74 12/30/2016 1700   BASOSABS 0 12/30/2016 1700    CHEMISTRY No results for input(s): NA, K, CL, CO2, GLUCOSE, BUN, CREATININE, CALCIUM, MG, PHOS in the last 168 hours. CrCl cannot be calculated (Patient's most recent lab result is older than the maximum 21 days  allowed.).   Chest Imaging- films reviewed: CTA chest 08/19/2019-mild groundglass most notable in the dependent portions of the lungs.  Cardiomegaly, reflux of contrast from right atrium down IVC.  Dilated pulmonary veins.  Normal pulmonary artery.  No PE.  Senescent vascular calcification.  No significant mediastinal or hilar adenopathy.  Pulmonary Functions Testing Results: PFT Results Latest Ref Rng & Units 08/27/2019  FVC-Pre L 2.95  FVC-Predicted Pre % 128  FVC-Post L 2.85  FVC-Predicted Post % 124  Pre FEV1/FVC % % 74  Post FEV1/FCV % % 77  FEV1-Pre L 2.18  FEV1-Predicted Pre % 128  FEV1-Post L 2.19  DLCO UNC% % 79  DLCO COR %Predicted % 81  TLC L 4.60  TLC % Predicted % 90  RV % Predicted % 71   2021- No significant obstruction or bronchodilator  reversibility.  No significant restriction.  Mildly impaired diffusion capacity.   Echocardiogram 09/04/2019: LVEF 65 to 70%, moderate concentric LVH, grade 1 diastolic dysfunction with elevated LAP.  Normal atrial size bilaterally, normal RV.  Mild MR, mild to moderate AR.      Assessment & Plan:     ICD-10-CM   1. Moderate persistent asthma without complication  I45.80     Persistent asthma-improved on ICS-LABA. -Continue Advair 250/50.  We will reevaluate in 3 months to determine if she can de-escalate to low-dose Advair.  She should continue to rinse her mouth after every use. -Continue Covid precautions and mask wearing.  Thankfully she is vaccinated.  Allergic rhinosinusitis -Start azelastine nasal spray twice daily -If persistent symptoms, would add Flonase once daily  RTC in 3 months.    Current Outpatient Medications:  .  albuterol (VENTOLIN HFA) 108 (90 Base) MCG/ACT inhaler, INHALE 2 PUFFS BY MOUTH EVERY 6 HOURS AS NEEDED FOR WHEEZING OR FOR SHORTNESS OF BREATH, Disp: 8.5 g, Rfl: 0 .  amLODipine (NORVASC) 5 MG tablet, Take one tablet twice daily, Disp: 180 tablet, Rfl: 3 .  aspirin 81 MG tablet, Take 81 mg by  mouth daily.  , Disp: , Rfl:  .  CALCIUM PO, Take 1 capsule by mouth daily., Disp: , Rfl:  .  Cholecalciferol (VITAMIN D-3) 25 MCG (1000 UT) CAPS, Take 1,000 Units by mouth daily., Disp: , Rfl:  .  EPINEPHrine (EPIPEN 2-PAK) 0.3 mg/0.3 mL IJ SOAJ injection, Inject 0.3 mg into the muscle as needed for anaphylaxis., Disp: , Rfl:  .  famotidine (PEPCID) 20 MG tablet, Take 20 mg by mouth daily as needed (for GERD). , Disp: , Rfl:  .  Fluticasone-Salmeterol (ADVAIR DISKUS) 250-50 MCG/DOSE AEPB, Inhale 1 puff into the lungs 2 (two) times daily., Disp: 60 each, Rfl: 5 .  folic acid (FOLVITE) 998 MCG tablet, Take 400 mcg by mouth 2 (two) times daily., Disp: , Rfl:  .  levothyroxine (SYNTHROID) 25 MCG tablet, TAKE ONE TABLET BY MOUTH DAILY BEFORE BREAKFAST;, Disp: 90 tablet, Rfl: 3 .  metoprolol tartrate (LOPRESSOR) 25 MG tablet, TAKE TWO TABLETS BY MOUTH EVERY MORNING AND TAKE ONE TABLET BY MOUTH EVERY EVENING., Disp: 270 tablet, Rfl: 3 .  pantoprazole (PROTONIX) 40 MG tablet, Take 1 tablet (40 mg total) by mouth daily., Disp: 30 tablet, Rfl: 6 .  Probiotic Product (ALIGN) 4 MG CAPS, Take 4 mg by mouth daily., Disp: , Rfl:  .  pyridoxine (B-6) 100 MG tablet, Take 100 mg by mouth daily., Disp: , Rfl:  .  vitamin B-12 (CYANOCOBALAMIN) 500 MCG tablet, Take 1,000 mcg by mouth 2 (two) times daily. , Disp: , Rfl:  .  azelastine (ASTELIN) 0.1 % nasal spray, Place 2 sprays into both nostrils 2 (two) times daily. Use in each nostril as directed, Disp: 30 mL, Rfl: 12  Current Facility-Administered Medications:  .  0.9 %  sodium chloride infusion, 500 mL, Intravenous, Continuous, Nandigam, Kavitha V, MD .  cloNIDine (CATAPRES) tablet 0.1 mg, 0.1 mg, Oral, Once, Nafziger, Tommi Rumps, NP     Julian Hy, DO [facility] 12/10/2019 12:23 PM

## 2019-12-27 ENCOUNTER — Ambulatory Visit
Admission: RE | Admit: 2019-12-27 | Discharge: 2019-12-27 | Disposition: A | Discharge status: Home / Self Care | Payer: Medicare Other | Source: Ambulatory Visit | Attending: Family Medicine | Admitting: Family Medicine

## 2019-12-27 ENCOUNTER — Other Ambulatory Visit: Payer: Self-pay

## 2019-12-27 DIAGNOSIS — Z1231 Encounter for screening mammogram for malignant neoplasm of breast: Secondary | ICD-10-CM | POA: Diagnosis not present

## 2020-01-02 ENCOUNTER — Telehealth: Payer: Self-pay | Admitting: Family Medicine

## 2020-01-02 NOTE — Telephone Encounter (Signed)
Attempted to schedule AWV. Unable to LVM.  Will try at later time.  

## 2020-01-28 DIAGNOSIS — M5136 Other intervertebral disc degeneration, lumbar region: Secondary | ICD-10-CM | POA: Diagnosis not present

## 2020-02-06 ENCOUNTER — Other Ambulatory Visit: Payer: Self-pay

## 2020-02-06 ENCOUNTER — Ambulatory Visit (INDEPENDENT_AMBULATORY_CARE_PROVIDER_SITE_OTHER): Payer: Medicare Other

## 2020-02-06 DIAGNOSIS — Z0000 Encounter for general adult medical examination without abnormal findings: Secondary | ICD-10-CM

## 2020-02-06 NOTE — Patient Instructions (Signed)
Sara Medina , Thank you for taking time to come for your Medicare Wellness Visit. I appreciate your ongoing commitment to your health goals. Please review the following plan we discussed and let me know if I can assist you in the future.   Screening recommendations/referrals: Colonoscopy: No longer required Mammogram: Up to date, next due 12/26/2020 Bone Density: Up to date, next due 06/30/2023 Recommended yearly ophthalmology/optometry visit for glaucoma screening and checkup Recommended yearly dental visit for hygiene and checkup  Vaccinations: Influenza vaccine: Currently due, you may receive at your local pharmacy or in our office  Pneumococcal vaccine: Completed series Tdap vaccine: Up to date next due 02/26/2022 Shingles vaccine: Completed series    Advanced directives: Please bring copies of your advanced directives to your next office visit so that we may scan them into your chart  Conditions/risks identified: None  Next appointment: None    Preventive Care 65 Years and Older, Female Preventive care refers to lifestyle choices and visits with your health care provider that can promote health and wellness. What does preventive care include?  A yearly physical exam. This is also called an annual well check.  Dental exams once or twice a year.  Routine eye exams. Ask your health care provider how often you should have your eyes checked.  Personal lifestyle choices, including:  Daily care of your teeth and gums.  Regular physical activity.  Eating a healthy diet.  Avoiding tobacco and drug use.  Limiting alcohol use.  Practicing safe sex.  Taking low-dose aspirin every day.  Taking vitamin and mineral supplements as recommended by your health care provider. What happens during an annual well check? The services and screenings done by your health care provider during your annual well check will depend on your age, overall health, lifestyle risk factors, and family  history of disease. Counseling  Your health care provider may ask you questions about your:  Alcohol use.  Tobacco use.  Drug use.  Emotional well-being.  Home and relationship well-being.  Sexual activity.  Eating habits.  History of falls.  Memory and ability to understand (cognition).  Work and work Statistician.  Reproductive health. Screening  You may have the following tests or measurements:  Height, weight, and BMI.  Blood pressure.  Lipid and cholesterol levels. These may be checked every 5 years, or more frequently if you are over 41 years old.  Skin check.  Lung cancer screening. You may have this screening every year starting at age 18 if you have a 30-pack-year history of smoking and currently smoke or have quit within the past 15 years.  Fecal occult blood test (FOBT) of the stool. You may have this test every year starting at age 26.  Flexible sigmoidoscopy or colonoscopy. You may have a sigmoidoscopy every 5 years or a colonoscopy every 10 years starting at age 39.  Hepatitis C blood test.  Hepatitis B blood test.  Sexually transmitted disease (STD) testing.  Diabetes screening. This is done by checking your blood sugar (glucose) after you have not eaten for a while (fasting). You may have this done every 1-3 years.  Bone density scan. This is done to screen for osteoporosis. You may have this done starting at age 63.  Mammogram. This may be done every 1-2 years. Talk to your health care provider about how often you should have regular mammograms. Talk with your health care provider about your test results, treatment options, and if necessary, the need for more tests. Vaccines  Your health care provider may recommend certain vaccines, such as:  Influenza vaccine. This is recommended every year.  Tetanus, diphtheria, and acellular pertussis (Tdap, Td) vaccine. You may need a Td booster every 10 years.  Zoster vaccine. You may need this after  age 66.  Pneumococcal 13-valent conjugate (PCV13) vaccine. One dose is recommended after age 14.  Pneumococcal polysaccharide (PPSV23) vaccine. One dose is recommended after age 74. Talk to your health care provider about which screenings and vaccines you need and how often you need them. This information is not intended to replace advice given to you by your health care provider. Make sure you discuss any questions you have with your health care provider. Document Released: 06/12/2015 Document Revised: 02/03/2016 Document Reviewed: 03/17/2015 Elsevier Interactive Patient Education  2017 [employer] Prevention in the Home Falls can cause injuries. They can happen to people of all ages. There are many things you can do to make your home safe and to help prevent falls. What can I do on the outside of my home?  Regularly fix the edges of walkways and driveways and fix any cracks.  Remove anything that might make you trip as you walk through a door, such as a raised step or threshold.  Trim any bushes or trees on the path to your home.  Use bright outdoor lighting.  Clear any walking paths of anything that might make someone trip, such as rocks or tools.  Regularly check to see if handrails are loose or broken. Make sure that both sides of any steps have handrails.  Any raised decks and porches should have guardrails on the edges.  Have any leaves, snow, or ice cleared regularly.  Use sand or salt on walking paths during winter.  Clean up any spills in your garage right away. This includes oil or grease spills. What can I do in the bathroom?  Use night lights.  Install grab bars by the toilet and in the tub and shower. Do not use towel bars as grab bars.  Use non-skid mats or decals in the tub or shower.  If you need to sit down in the shower, use a plastic, non-slip stool.  Keep the floor dry. Clean up any water that spills on the floor as soon as it  happens.  Remove soap buildup in the tub or shower regularly.  Attach bath mats securely with double-sided non-slip rug tape.  Do not have throw rugs and other things on the floor that can make you trip. What can I do in the bedroom?  Use night lights.  Make sure that you have a light by your bed that is easy to reach.  Do not use any sheets or blankets that are too big for your bed. They should not hang down onto the floor.  Have a firm chair that has side arms. You can use this for support while you get dressed.  Do not have throw rugs and other things on the floor that can make you trip. What can I do in the kitchen?  Clean up any spills right away.  Avoid walking on wet floors.  Keep items that you use a lot in easy-to-reach places.  If you need to reach something above you, use a strong step stool that has a grab bar.  Keep electrical cords out of the way.  Do not use floor polish or wax that makes floors slippery. If you must use wax, use non-skid floor wax.  Do  not have throw rugs and other things on the floor that can make you trip. What can I do with my stairs?  Do not leave any items on the stairs.  Make sure that there are handrails on both sides of the stairs and use them. Fix handrails that are broken or loose. Make sure that handrails are as long as the stairways.  Check any carpeting to make sure that it is firmly attached to the stairs. Fix any carpet that is loose or worn.  Avoid having throw rugs at the top or bottom of the stairs. If you do have throw rugs, attach them to the floor with carpet tape.  Make sure that you have a light switch at the top of the stairs and the bottom of the stairs. If you do not have them, ask someone to add them for you. What else can I do to help prevent falls?  Wear shoes that:  Do not have high heels.  Have rubber bottoms.  Are comfortable and fit you well.  Are closed at the toe. Do not wear sandals.  If you  use a stepladder:  Make sure that it is fully opened. Do not climb a closed stepladder.  Make sure that both sides of the stepladder are locked into place.  Ask someone to hold it for you, if possible.  Clearly mark and make sure that you can see:  Any grab bars or handrails.  First and last steps.  Where the edge of each step is.  Use tools that help you move around (mobility aids) if they are needed. These include:  Canes.  Walkers.  Scooters.  Crutches.  Turn on the lights when you go into a dark area. Replace any light bulbs as soon as they burn out.  Set up your furniture so you have a clear path. Avoid moving your furniture around.  If any of your floors are uneven, fix them.  If there are any pets around you, be aware of where they are.  Review your medicines with your doctor. Some medicines can make you feel dizzy. This can increase your chance of falling. Ask your doctor what other things that you can do to help prevent falls. This information is not intended to replace advice given to you by your health care provider. Make sure you discuss any questions you have with your health care provider. Document Released: 03/12/2009 Document Revised: 10/22/2015 Document Reviewed: 06/20/2014 Elsevier Interactive Patient Education  2017 [employer].

## 2020-02-06 NOTE — Progress Notes (Signed)
Subjective:   Sara Medina is a 86 y.o. female who presents for Medicare Annual (Subsequent) preventive examination.  I connected with Sara Medina today by telephone and verified that I am speaking with the correct person using two identifiers. Location patient: home Location provider: work Persons participating in the virtual visit: patient, provider.   I discussed the limitations, risks, security and privacy concerns of performing an evaluation and management service by telephone and the availability of in person appointments. I also discussed with the patient that there may be a patient responsible charge related to this service. The patient expressed understanding and verbally consented to this telephonic visit.    Interactive audio and video telecommunications were attempted between this provider and patient, however failed, due to patient having technical difficulties OR patient did not have access to video capability.  We continued and completed visit with audio only.     Review of Systems    N/A Cardiac Risk Factors include: advanced age (>67men, >31 women);hypertension     Objective:    Today's Vitals   There is no height or weight on file to calculate BMI.  Advanced Directives 02/06/2020 05/12/2017 05/13/2013  Does Patient Have a Medical Advance Directive? Yes Yes Patient does not have advance directive  Type of Advance Directive [place];Living will - -  Does patient want to make changes to medical advance directive? No - Patient declined - -  Copy of [place] in Chart? No - copy requested - -  Pre-existing out of facility DNR order (yellow form or pink MOST form) - - No    Current Medications (verified) Outpatient Encounter Medications as of 02/06/2020  Medication Sig  . amLODipine (NORVASC) 5 MG tablet Take one tablet twice daily  . aspirin 81 MG tablet Take 81 mg by mouth daily.    Marland Kitchen azelastine (ASTELIN) 0.1 % nasal spray  Place 2 sprays into both nostrils 2 (two) times daily. Use in each nostril as directed  . CALCIUM PO Take 1 capsule by mouth daily.  . Cholecalciferol (VITAMIN D-3) 25 MCG (1000 UT) CAPS Take 1,000 Units by mouth daily.  . Fluticasone-Salmeterol (ADVAIR DISKUS) 250-50 MCG/DOSE AEPB Inhale 1 puff into the lungs 2 (two) times daily.  . folic acid (FOLVITE) 195 MCG tablet Take 400 mcg by mouth 2 (two) times daily.  Marland Kitchen levothyroxine (SYNTHROID) 25 MCG tablet TAKE ONE TABLET BY MOUTH DAILY BEFORE BREAKFAST;  . metoprolol tartrate (LOPRESSOR) 25 MG tablet TAKE TWO TABLETS BY MOUTH EVERY MORNING AND TAKE ONE TABLET BY MOUTH EVERY EVENING.  . pantoprazole (PROTONIX) 40 MG tablet Take 1 tablet (40 mg total) by mouth daily.  . Probiotic Product (ALIGN) 4 MG CAPS Take 4 mg by mouth daily.  Marland Kitchen pyridoxine (B-6) 100 MG tablet Take 100 mg by mouth daily.  . vitamin B-12 (CYANOCOBALAMIN) 500 MCG tablet Take 1,000 mcg by mouth 2 (two) times daily.   Marland Kitchen albuterol (VENTOLIN HFA) 108 (90 Base) MCG/ACT inhaler INHALE 2 PUFFS BY MOUTH EVERY 6 HOURS AS NEEDED FOR WHEEZING OR FOR SHORTNESS OF BREATH (Patient not taking: Reported on 02/06/2020)  . EPINEPHrine (EPIPEN 2-PAK) 0.3 mg/0.3 mL IJ SOAJ injection Inject 0.3 mg into the muscle as needed for anaphylaxis. (Patient not taking: Reported on 02/06/2020)  . famotidine (PEPCID) 20 MG tablet Take 20 mg by mouth daily as needed (for GERD).  (Patient not taking: Reported on 02/06/2020)   Facility-Administered Encounter Medications as of 02/06/2020  Medication  .  0.9 %  sodium chloride infusion  . cloNIDine (CATAPRES) tablet 0.1 mg    Allergies (verified) Other, Peanut-containing drug products, Pecan nut (diagnostic), and Penicillins   History: Past Medical History:  Diagnosis Date  . ALLERGIC RHINITIS 05/04/2010   Qualifier: Diagnosis of  By: Jimmye Norman, LPN, [place] [facility]   . Arthritis   . Asthma   . Asthma, exercise induced 08/18/2010    She uses the Proventil  inhaler on rare occasions not on a daily basis   . CARDIAC ARRHYTHMIA 05/04/2010   Qualifier: Diagnosis of  By: Jimmye Norman, LPN, [place]   . Chest tightness or pressure 05/13/2013  . Colon polyps   . COLONIC POLYPS 05/04/2010   Qualifier: Diagnosis of  By: Jimmye Norman, LPN, [place] Cystocele 10/02/2014  . Depression   . DIVERTICULITIS, HX OF 05/04/2010   Qualifier: Diagnosis of  By: Jimmye Norman, LPN, [place]   . Diverticulosis   . DIVERTICULOSIS, COLON 05/04/2010   Qualifier: Diagnosis of  By: Jimmye Norman, LPN, [place]   . Essential hypertension 05/04/2010   Qualifier: Diagnosis of  By: Jimmye Norman, LPN, [place]   . GERD 05/04/2010   Qualifier: Diagnosis of  By: Jimmye Norman, LPN, [place]   . GERD (gastroesophageal reflux disease)   . History of cardiovascular disorder 05/04/2010   Qualifier: Diagnosis of  By: Arnoldo Morale MD, [place] Quails   . History of diverticulitis of colon   . Hyperlipidemia   . HYPERLIPIDEMIA 05/04/2010   Qualifier: Diagnosis of  By: Jimmye Norman, LPN, [place]   . Hypertension   . Hypothyroidism 05/04/2010   Qualifier: Diagnosis of  By: Jimmye Norman, LPN, [place]   . Menopausal disorder 08/18/2010   When she went off the HRT she had severe mood disorder   . Mitral valve prolapse   . Osteoarthritis 05/04/2010   Qualifier: Diagnosis of  By: Jimmye Norman, LPN, [place]   . Palpitations 06/30/2014  . Spinal stenosis   . Thyroid disease    Past Surgical History:  Procedure Laterality Date  . ABDOMINAL HYSTERECTOMY    . SHOULDER SURGERY Right   . TONSILLECTOMY    . trigger thumb Right   . TUBAL LIGATION     Family History  Problem Relation Age of Onset  . Ovarian cancer Mother   . Heart disease Father   . Stroke Father   . Allergies Father   . Cancer Sister        brain  . Breast cancer Maternal Aunt   . Colon cancer Neg Hx    Social History   Socioeconomic History  . Marital status: Widowed    Spouse name: Not on file  . Number of children: 3  . Years of education: Not on file  .  Highest education level: Not on file  Occupational History  . Occupation: retired    Fish farm manager: RETIRED  Tobacco Use  . Smoking status: Never Smoker  . Smokeless tobacco: Never Used  Vaping Use  . Vaping Use: Never used  Substance and Sexual Activity  . Alcohol use: Yes    Alcohol/week: 0.0 standard drinks    Comment: Couple glasses of wine per week  . Drug use: No  . Sexual activity: Not Currently  Other Topics Concern  . Not on file  Social History Narrative  . Not on file   Social Determinants of Health   Financial Resource Strain: Low Risk   . Difficulty of Paying Living Expenses: Not hard at  all  Food Insecurity: No Food Insecurity  . Worried About Charity fundraiser in the Last Year: Never true  . Ran Out of Food in the Last Year: Never true  Transportation Needs: No Transportation Needs  . Lack of Transportation (Medical): No  . Lack of Transportation (Non-Medical): No  Physical Activity: Inactive  . Days of Exercise per Week: 0 days  . Minutes of Exercise per Session: 0 min  Stress: No Stress Concern Present  . Feeling of Stress : Not at all  Social Connections: Moderately Isolated  . Frequency of Communication with Friends and Family: More than three times a week  . Frequency of Social Gatherings with Friends and Family: More than three times a week  . Attends Religious Services: Never  . Active Member of Clubs or Organizations: Yes  . Attends Archivist Meetings: More than 4 times per year  . Marital Status: Widowed    Tobacco Counseling Counseling given: Not Answered   Clinical Intake:  Pre-visit preparation completed: Yes  Pain : No/denies pain     Nutritional Risks: None Diabetes: No  How often do you need to have someone help you when you read instructions, pamphlets, or other written materials from your doctor or pharmacy?: 1 - Never What is the last grade level you completed in school?: Masters Degree  Diabetic?No  Interpreter  Needed?: No  Information entered by :: [place] of Daily Living In your present state of health, do you have any difficulty performing the following activities: 02/06/2020  Hearing? Y  Comment Wears hearing aids bilaterally  Vision? Y  Comment has issues with reading  Difficulty concentrating or making decisions? Y  Comment has issues with finding words  Walking or climbing stairs? N  Dressing or bathing? N  Doing errands, shopping? Y  Preparing Food and eating ? N  Using the Toilet? N  In the past six months, have you accidently leaked urine? Y  Comment Has occassional bladder  Do you have problems with loss of bowel control? N  Managing your Medications? N  Managing your Finances? N  Housekeeping or managing your Housekeeping? N  Some recent data might be hidden    Patient Care Team: Eulas Post, MD as PCP - General (Family Medicine) Fay Records, MD as PCP - Cardiology (Cardiology)  Indicate any recent Medical Services you may have received from other than [facility] providers in the past year (date may be approximate).     Assessment:   This is a routine wellness examination for Jeny.  Hearing/Vision screen  Hearing Screening   125Hz  250Hz  500Hz  1000Hz  2000Hz  3000Hz  4000Hz  6000Hz  8000Hz   Right ear:           Left ear:           Vision Screening Comments: Patient states she gets her eyes checked yearly   Dietary issues and exercise activities discussed: Current Exercise Habits: The patient does not participate in regular exercise at present  Goals    . Weight (lb) < 124 lb (56.2 kg)     Watches her weight but eats well at [place]       Depression Screen PHQ 2/9 Scores 02/06/2020 11/04/2019 06/25/2018 05/12/2017 08/23/2016 11/05/2015 10/02/2014  PHQ - 2 Score 0 0 0 0 0 0 0  PHQ- 9 Score 0 - 0 - - - -    Fall Risk Fall Risk  02/06/2020 11/04/2019 06/25/2018 05/12/2017 08/23/2016  Falls in the past year?  0 0 0 Yes No  Number falls in past yr: 0 0 - 1 -    Injury with Fall? 0 0 - No -  Risk for fall due to : Impaired balance/gait;Medication side effect - - - -  Follow up Falls evaluation completed;Falls prevention discussed - - Education provided -    Any stairs in or around the home? No  If so, are there any without handrails? No  Home free of loose throw rugs in walkways, pet beds, electrical cords, etc? Yes  Adequate lighting in your home to reduce risk of falls? Yes   ASSISTIVE DEVICES UTILIZED TO PREVENT FALLS:  Life alert? No  Use of a cane, walker or w/c? No  Grab bars in the bathroom? Yes  Shower chair or bench in shower? No  Elevated toilet seat or a handicapped toilet? Yes     Cognitive Function: MMSE - Mini Mental State Exam 05/12/2017  Not completed: (No Data)     6CIT Screen 02/06/2020  What Year? 4 points  What month? 0 points  What time? 0 points  Count back from 20 0 points  Months in reverse 2 points  Repeat phrase 0 points  Total Score 6    Immunizations Immunization History  Administered Date(s) Administered  . Influenza Split 02/18/2011, 02/27/2012  . Influenza Whole 02/23/2010  . Influenza, High Dose Seasonal PF 02/24/2014, 03/09/2015, 02/11/2016, 02/13/2017, 02/07/2019  . Influenza,inj,Quad PF,6+ Mos 02/12/2013  . Moderna SARS-COVID-2 Vaccination 06/14/2019, 07/15/2019  . Pneumococcal Polysaccharide-23 03/04/2003  . Td 03/10/2002  . Tdap 02/27/2012  . Zoster 02/02/2005  . Zoster Recombinat (Shingrix) 10/21/2017, 12/25/2017, 12/25/2017    TDAP status: Up to date Flu Vaccine status: Up to date Pneumococcal vaccine status: Up to date Covid-19 vaccine status: Completed vaccines  Qualifies for Shingles Vaccine? Yes   Zostavax completed Yes   Shingrix Completed?: Yes  Screening Tests Health Maintenance  Topic Date Due  . INFLUENZA VACCINE  12/29/2019  . PNA vac Low Risk Adult (2 of 2 - PCV13) 04/13/2025 (Originally 03/03/2004)  . TETANUS/TDAP  02/26/2022  . DEXA SCAN  Completed  .  COVID-19 Vaccine  Completed    Health Maintenance  Health Maintenance Due  Topic Date Due  . INFLUENZA VACCINE  12/29/2019    Colorectal cancer screening: No longer required.  Mammogram status: Completed 12/27/2019. Repeat every year Bone Density status: Completed 06/29/2018. Results reflect: Bone density results: OSTEOPENIA. Repeat every 5 years.  Lung Cancer Screening: (Low Dose CT Chest recommended if Age 82-80 years, 30 pack-year currently smoking OR have quit w/in 15years.) does not qualify.   Lung Cancer Screening Referral: N/A  Additional Screening:  Hepatitis C Screening: does not qualify;  Vision Screening: Recommended annual ophthalmology exams for early detection of glaucoma and other disorders of the eye. Is the patient up to date with their annual eye exam?  Yes  Who is the provider or what is the name of the office in which the patient attends annual eye exams? [place] If pt is not established with a provider, would they like to be referred to a provider to establish care? No .   Dental Screening: Recommended annual dental exams for proper oral hygiene  Community Resource Referral / Chronic Care Management: CRR required this visit?  No   CCM required this visit?  No      Plan:     I have personally reviewed and noted the following in the patient's chart:   .  Medical and social history . Use of alcohol, tobacco or illicit drugs  . Current medications and supplements . Functional ability and status . Nutritional status . Physical activity . Advanced directives . List of other physicians . Hospitalizations, surgeries, and ER visits in previous 12 months . Vitals . Screenings to include cognitive, depression, and falls . Referrals and appointments  In addition, I have reviewed and discussed with patient certain preventive protocols, quality metrics, and best practice recommendations. A written personalized care plan for preventive  services as well as general preventive health recommendations were provided to patient.     Ofilia Neas, LPN   06/05/5535   Nurse Notes: None

## 2020-02-21 DIAGNOSIS — M5416 Radiculopathy, lumbar region: Secondary | ICD-10-CM | POA: Diagnosis not present

## 2020-03-02 DIAGNOSIS — Z23 Encounter for immunization: Secondary | ICD-10-CM | POA: Diagnosis not present

## 2020-03-10 ENCOUNTER — Other Ambulatory Visit: Payer: Self-pay

## 2020-03-10 ENCOUNTER — Ambulatory Visit (INDEPENDENT_AMBULATORY_CARE_PROVIDER_SITE_OTHER): Payer: Medicare Other | Admitting: Critical Care Medicine

## 2020-03-10 ENCOUNTER — Encounter: Payer: Self-pay | Admitting: Critical Care Medicine

## 2020-03-10 VITALS — BP 132/60 | HR 86 | Ht 63.39 in | Wt 141.6 lb

## 2020-03-10 DIAGNOSIS — J4530 Mild persistent asthma, uncomplicated: Secondary | ICD-10-CM | POA: Diagnosis not present

## 2020-03-10 DIAGNOSIS — R0982 Postnasal drip: Secondary | ICD-10-CM | POA: Diagnosis not present

## 2020-03-10 DIAGNOSIS — J309 Allergic rhinitis, unspecified: Secondary | ICD-10-CM | POA: Diagnosis not present

## 2020-03-10 DIAGNOSIS — R490 Dysphonia: Secondary | ICD-10-CM | POA: Diagnosis not present

## 2020-03-10 NOTE — Patient Instructions (Addendum)
Thank you for visiting Dr. Carlis Abbott at [facility]. We recommend the following:  It is ok to try stopping your Advair for a week to see if your hoarseness improves. Pay attention to see if your breathing gets worse while off Advair.  You can continue to use your azelastine only once per day, but if your post-nasal drip is worsening, you may need to go back to twice per day.  You are safe to keep using menthol cough drops.    Return in about 3 months (around 06/10/2020). with Dr. Shearon Stalls (30 minutes visit).    Please do your part to reduce the spread of COVID-19.

## 2020-03-10 NOTE — Progress Notes (Signed)
Synopsis: Referred in May 2021 for cough, shortness of breath by Eulas Post, MD  Subjective:   PATIENT ID: Sara Medina GENDER: female DOB: 1930-02-21, MRN: [id number]  Chief Complaint  Patient presents with  . Follow-up    Patient is having side affects from meds and wants to talk about it    Sara Medina is an 86 y/o woman with a history of exercise induced asthma who presents for follow up with her daughter. She has multiple medication side effects to discuss, but is overall feeling very well.  She has noticed a bad taste in her mouth in the morning when she wakes up with using azelastine twice daily, so she is cut back to once daily.  She continues to have frequent throat clearing.  She has hoarseness, which she wonders if this is due to Advair.  She is continued taking Advair.  She has no wheezing, nocturnal awakenings, or coughing.  She still is dyspnea on exertion, but admits she is not very active.  She is up-to-date on her Moderna Covid vaccine and flu shot.     OV 12/10/19: Sara Medina is an 86 year old woman who presents for follow-up of shortness of breath.  She is accompanied today by her daughter.  She has had induced asthma for many years and was previously not on scheduled inhalers.  She was started on Advair twice daily at her last visit and has since noticed she has no shortness of breath or wheezing.  She now has postnasal drip causing hoarseness, throat clearing, and cough.  She has a cat which she thinks she may be allergic to, but she does not agree to a cat.  She has had both Covid vaccines and continues to have weekly Covid surveillance testing at her senior living community.  She continues to wear her mask regularly. PCP note reviewed 11/04/2019-she recently lost 6 pounds intentionally.  She has upcoming wisdom tooth surgery planned.   OV 10/14/19: Sara Medina is an 86 year old woman who presents for evaluation of shortness of breath.  She has a history of sports  induced asthma that she was diagnosed with while living in [place] several years ago, treated with albuterol as needed.  Her asthma worsened when she moved to [place].  She has worse symptoms when the weather is hot and humid.  She currently uses albuterol about 1 day/week with improvement in her symptoms.  Her dyspnea on exertion has been worsening over the last few months so that she is shortness of breath with walking around her house.  There is no variation in her symptoms from times of day.  She does not have shortness of breath at rest.  She has occasional wheezing with her coughing spells, but denies sputum production.  She has never smoked or vaped.  There is no family history of lung disease.  During the Covid pandemic she has gained about 15 pounds in the last year which she thinks contributes to her shortness of breath.  She is working on losing the extra weight.  Her mobility has been significantly limited due to spinal stenosis; she reports that she can barely walk now.  She has had no issues with edema, orthopnea, PND.  She has chronic gastroparesis which is managed with dietary modifications; this was diagnosed about 3 years ago.  Cardiology- Dr. Alan Ripper 08/22/19 note reviewed.    Past Medical History:  Diagnosis Date  . ALLERGIC RHINITIS 05/04/2010   Qualifier: Diagnosis of  By: Jimmye Norman,  LPN, [place]un[place]   . Allergy   . Arthritis   . Asthma   . Asthma, exercise induced 08/18/2010    She uses the Proventil inhaler on rare occasions not on a daily basis   . CARDIAC ARRHYTHMIA 05/04/2010   Qualifier: Diagnosis of  By: Jimmye Norman, LPN, [place] Cun[place]   . Chest tightness or pressure 05/13/2013  . Colon polyps   . COLONIC POLYPS 05/04/2010   Qualifier: Diagnosis of  By: Jimmye Norman, LPN, [place] Cystocele 10/02/2014  . Depression   . DIVERTICULITIS, HX OF 05/04/2010   Qualifier: Diagnosis of  By: Jimmye Norman, LPN, [place] Cunas   . Diverticulosis   . DIVERTICULOSIS, COLON 05/04/2010   Qualifier:  Diagnosis of  By: Jimmye Norman, LPN, [place] Cunas   . Essential hypertension 05/04/2010   Qualifier: Diagnosis of  By: Jimmye Norman, LPN, [place] Cunas   . GERD 05/04/2010   Qualifier: Diagnosis of  By: Jimmye Norman, LPN, [place] Cunas   . GERD (gastroesophageal reflux disease)   . History of cardiovascular disorder 05/04/2010   Qualifier: Diagnosis of  By: Arnoldo Morale MD, Balinda Quails   . History of diverticulitis of colon   . Hyperlipidemia   . HYPERLIPIDEMIA 05/04/2010   Qualifier: Diagnosis of  By: Jimmye Norman, LPN, [place] Cunas   . Hypertension   . Hypothyroidism 05/04/2010   Qualifier: Diagnosis of  By: Jimmye Norman, LPN, [place] Cunas   . Menopausal disorder 08/18/2010   When she went off the HRT she had severe mood disorder   . Mitral valve prolapse   . Osteoarthritis 05/04/2010   Qualifier: Diagnosis of  By: Jimmye Norman, LPN, [place] Cun[place]   . Palpitations 06/30/2014  . Spinal stenosis   . Thyroid disease      Family History  Problem Relation Age of Onset  . Ovarian cancer Mother   . Heart disease Father   . Stroke Father   . Allergies Father   . Cancer Sister        brain  . Breast cancer Maternal Aunt   . Colon cancer Neg Hx      Past Surgical History:  Procedure Laterality Date  . ABDOMINAL HYSTERECTOMY    . SHOULDER SURGERY Right   . TONSILLECTOMY    . trigger thumb Right   . TUBAL LIGATION      Social History   Socioeconomic History  . Marital status: Widowed    Spouse name: Not on file  . Number of children: 3  . Years of education: Not on file  . Highest education level: Not on file  Occupational History  . Occupation: retired    Fish farm manager: RETIRED  Tobacco Use  . Smoking status: Never Smoker  . Smokeless tobacco: Never Used  Vaping Use  . Vaping Use: Never used  Substance and Sexual Activity  . Alcohol use: Yes    Alcohol/week: 0.0 standard drinks    Comment: Couple glasses of wine per week  . Drug use: No  . Sexual activity: Not Currently  Other Topics Concern  . Not on file  Social History  Narrative  . Not on file   Social Determinants of Health   Financial Resource Strain: Low Risk   . Difficulty of Paying Living Expenses: Not hard at all  Food Insecurity: No Food Insecurity  . Worried About Charity fundraiser in the Last Year: Never true  . Ran Out of Food in the Last Year: Never true  Transportation Needs: No Transportation Needs  . Lack of Transportation (  Medical): No  . Lack of Transportation (Non-Medical): No  Physical Activity: Inactive  . Days of Exercise per Week: 0 days  . Minutes of Exercise per Session: 0 min  Stress: No Stress Concern Present  . Feeling of Stress : Not at all  Social Connections: Moderately Isolated  . Frequency of Communication with Friends and Family: More than three times a week  . Frequency of Social Gatherings with Friends and Family: More than three times a week  . Attends Religious Services: Never  . Active Member of Clubs or Organizations: Yes  . Attends Archivist Meetings: More than 4 times per year  . Marital Status: Widowed  Intimate Partner Violence: Not At Risk  . Fear of Current or Ex-Partner: No  . Emotionally Abused: No  . Physically Abused: No  . Sexually Abused: No     Allergies  Allergen Reactions  . Other Anaphylaxis, Shortness Of Breath, Swelling and Rash    ALL NUTS!!!!  . Peanut-Containing Drug Products Anaphylaxis, Shortness Of Breath, Swelling and Rash  . Pecan Nut (Diagnostic) Anaphylaxis, Shortness Of Breath and Rash  . Penicillins Swelling    Full facial swelling Did it involve swelling of the face/tongue/throat, SOB, or low BP? Yes Did it involve sudden or severe rash/hives, skin peeling, or any reaction on the inside of your mouth or nose? Yes Did you need to seek medical attention at a hospital or doctor's office? No When did it last happen? "many years ago" If all above answers are "NO", may proceed with cephalosporin use.      Immunization History  Administered Date(s)  Administered  . Fluad Quad(high Dose 65+) 03/02/2020  . Influenza Split 02/18/2011, 02/27/2012  . Influenza Whole 02/23/2010  . Influenza, High Dose Seasonal PF 02/24/2014, 03/09/2015, 02/11/2016, 02/13/2017, 02/07/2019  . Influenza,inj,Quad PF,6+ Mos 02/12/2013  . Moderna SARS-COVID-2 Vaccination 06/14/2019, 07/15/2019  . Pneumococcal Polysaccharide-23 03/04/2003  . Td 03/10/2002  . Tdap 02/27/2012  . Zoster 02/02/2005  . Zoster Recombinat (Shingrix) 10/21/2017, 12/25/2017, 12/25/2017    Outpatient Medications Prior to Visit  Medication Sig Dispense Refill  . albuterol (VENTOLIN HFA) 108 (90 Base) MCG/ACT inhaler INHALE 2 PUFFS BY MOUTH EVERY 6 HOURS AS NEEDED FOR WHEEZING OR FOR SHORTNESS OF BREATH 8.5 g 0  . amLODipine (NORVASC) 5 MG tablet Take one tablet twice daily 180 tablet 3  . aspirin 81 MG tablet Take 81 mg by mouth daily.      Marland Kitchen azelastine (ASTELIN) 0.1 % nasal spray Place 2 sprays into both nostrils 2 (two) times daily. Use in each nostril as directed 30 mL 12  . CALCIUM PO Take 1 capsule by mouth daily.    . Cholecalciferol (VITAMIN D-3) 25 MCG (1000 UT) CAPS Take 1,000 Units by mouth daily.    Marland Kitchen EPINEPHrine (EPIPEN 2-PAK) 0.3 mg/0.3 mL IJ SOAJ injection Inject 0.3 mg into the muscle as needed for anaphylaxis.     . famotidine (PEPCID) 20 MG tablet Take 20 mg by mouth daily as needed (for GERD).     . Fluticasone-Salmeterol (ADVAIR DISKUS) 250-50 MCG/DOSE AEPB Inhale 1 puff into the lungs 2 (two) times daily. 60 each 5  . folic acid (FOLVITE) 694 MCG tablet Take 400 mcg by mouth 2 (two) times daily.    Marland Kitchen levothyroxine (SYNTHROID) 25 MCG tablet TAKE ONE TABLET BY MOUTH DAILY BEFORE BREAKFAST; 90 tablet 3  . metoprolol tartrate (LOPRESSOR) 25 MG tablet TAKE TWO TABLETS BY MOUTH EVERY MORNING AND TAKE ONE TABLET BY  MOUTH EVERY EVENING. 270 tablet 3  . pantoprazole (PROTONIX) 40 MG tablet Take 1 tablet (40 mg total) by mouth daily. 30 tablet 6  . Probiotic Product (ALIGN) 4 MG  CAPS Take 4 mg by mouth daily.    Marland Kitchen pyridoxine (B-6) 100 MG tablet Take 100 mg by mouth daily.    . vitamin B-12 (CYANOCOBALAMIN) 500 MCG tablet Take 1,000 mcg by mouth 2 (two) times daily.      Facility-Administered Medications Prior to Visit  Medication Dose Route Frequency Provider Last Rate Last Admin  . 0.9 %  sodium chloride infusion  500 mL Intravenous Continuous Nandigam, Kavitha V, MD      . cloNIDine (CATAPRES) tablet 0.1 mg  0.1 mg Oral Once Dorothyann Peng, NP        Review of Systems  Constitutional: Negative for chills and fever.  HENT: Negative for congestion.   Respiratory: Positive for cough, shortness of breath and wheezing.   Cardiovascular: Negative for chest pain and leg swelling.  Gastrointestinal: Negative for heartburn, nausea and vomiting.  Musculoskeletal: Positive for back pain.  Skin: Negative for rash.     Objective:   Vitals:   03/10/20 1618  BP: 132/60  Pulse: 86  SpO2: 96%  Weight: 141 lb 9.6 oz (64.2 kg)  Height: 5' 3.39" (1.61 m)   96% on  RA BMI Readings from Last 3 Encounters:  03/10/20 24.78 kg/m  12/10/19 24.03 kg/m  11/04/19 24.15 kg/m   Wt Readings from Last 3 Encounters:  03/10/20 141 lb 9.6 oz (64.2 kg)  12/10/19 137 lb 12.8 oz (62.5 kg)  11/04/19 138 lb 8 oz (62.8 kg)    Physical Exam Vitals reviewed.  Constitutional:      Appearance: Normal appearance. She is not ill-appearing.  HENT:     Head: Normocephalic and atraumatic.  Eyes:     General: No scleral icterus. Cardiovascular:     Rate and Rhythm: Normal rate and regular rhythm.     Heart sounds: No murmur heard.   Pulmonary:     Comments: CTAB, breathing comfortably on RA, no conversational dyspnea. Abdominal:     General: There is no distension.     Palpations: Abdomen is soft.     Tenderness: There is no abdominal tenderness.  Musculoskeletal:        General: No swelling or deformity.     Cervical back: Neck supple.  Lymphadenopathy:     Cervical: No  cervical adenopathy.  Skin:    General: Skin is warm and dry.     Findings: No rash.  Neurological:     Mental Status: She is alert.  Psychiatric:        Mood and Affect: Mood normal.        Behavior: Behavior normal.      CBC    Component Value Date/Time   WBC 8.7 08/19/2019 1608   RBC 4.08 08/19/2019 1608   HGB 13.4 08/19/2019 1608   HCT 39.6 08/19/2019 1608   PLT 318 08/19/2019 1608   MCV 97.1 08/19/2019 1608   MCH 32.8 08/19/2019 1608   MCHC 33.8 08/19/2019 1608   RDW 12.5 08/19/2019 1608   LYMPHSABS 1,554 12/30/2016 1700   MONOABS 666 12/30/2016 1700   EOSABS 74 12/30/2016 1700   BASOSABS 0 12/30/2016 1700    CHEMISTRY No results for input(s): NA, K, CL, CO2, GLUCOSE, BUN, CREATININE, CALCIUM, MG, PHOS in the last 168 hours. CrCl cannot be calculated (Patient's most recent lab result is older  than the maximum 21 days allowed.).   Chest Imaging- films reviewed: CTA chest 08/19/2019-mild groundglass most notable in the dependent portions of the lungs.  Cardiomegaly, reflux of contrast from right atrium down IVC.  Dilated pulmonary veins.  Normal pulmonary artery.  No PE.  Senescent vascular calcification.  No significant mediastinal or hilar adenopathy.  Pulmonary Functions Testing Results: PFT Results Latest Ref Rng & Units 08/27/2019  FVC-Pre L 2.95  FVC-Predicted Pre % 128  FVC-Post L 2.85  FVC-Predicted Post % 124  Pre FEV1/FVC % % 74  Post FEV1/FCV % % 77  FEV1-Pre L 2.18  FEV1-Predicted Pre % 128  FEV1-Post L 2.19  DLCO uncorrected ml/min/mmHg 14.57  DLCO UNC% % 79  DLCO corrected ml/min/mmHg 14.57  DLCO COR %Predicted % 79  DLVA Predicted % 81  TLC L 4.60  TLC % Predicted % 90  RV % Predicted % 71   2021- No significant obstruction or bronchodilator reversibility.  No significant restriction.  Mildly impaired diffusion capacity.   Echocardiogram 09/04/2019: LVEF 65 to 70%, moderate concentric LVH, grade 1 diastolic dysfunction with elevated LAP.   Normal atrial size bilaterally, normal RV.  Mild MR, mild to moderate AR.      Assessment & Plan:     ICD-10-CM   1. Hoarseness  R49.0   2. Allergic rhinitis with postnasal drip  J30.9    R09.82   3. Mild persistent asthma without complication  T46.56     Persistent asthma-improved on ICS-LABA. -Due to hoarseness, discontinue Advair for 1 week.  If no improvement in hoarseness, it may be beneficial to restart.  If she has no change in her breathing, she may do okay for an interval without it. -Up-to-date on Covid, flu, and pneumonia vaccines.  Cautioned to avoid sick contacts.  Hoarseness-possibly multifactorial from ICS versus uncontrolled postnasal drip. -Okay to continue using menthol cough drops. -Trial of stopping Advair for 1 week.  Pay attention to if the hoarseness improves as well as if her breathing is worse.  We discussed that some patients are unable to liberate from ICS due to the severity of the respiratory symptoms, but she may do well for an interval off of them.  She may do fine with rechallenging later. -If she has no change in her hoarseness with being off Advair, she may need to go back to azelastine twice daily to better control her postnasal drip.  Allergic rhinosinusitis -Okay to continue azelastine once daily.  If her hoarseness does not resolve with stopping Advair this needs to be increased to twice daily. -If persistent symptoms, can add Flonase once daily  RTC in 3 months with Dr. Shearon Stalls.    Current Outpatient Medications:  .  albuterol (VENTOLIN HFA) 108 (90 Base) MCG/ACT inhaler, INHALE 2 PUFFS BY MOUTH EVERY 6 HOURS AS NEEDED FOR WHEEZING OR FOR SHORTNESS OF BREATH, Disp: 8.5 g, Rfl: 0 .  amLODipine (NORVASC) 5 MG tablet, Take one tablet twice daily, Disp: 180 tablet, Rfl: 3 .  aspirin 81 MG tablet, Take 81 mg by mouth daily.  , Disp: , Rfl:  .  azelastine (ASTELIN) 0.1 % nasal spray, Place 2 sprays into both nostrils 2 (two) times daily. Use in each  nostril as directed, Disp: 30 mL, Rfl: 12 .  CALCIUM PO, Take 1 capsule by mouth daily., Disp: , Rfl:  .  Cholecalciferol (VITAMIN D-3) 25 MCG (1000 UT) CAPS, Take 1,000 Units by mouth daily., Disp: , Rfl:  .  EPINEPHrine (EPIPEN 2-PAK)  0.3 mg/0.3 mL IJ SOAJ injection, Inject 0.3 mg into the muscle as needed for anaphylaxis. , Disp: , Rfl:  .  famotidine (PEPCID) 20 MG tablet, Take 20 mg by mouth daily as needed (for GERD). , Disp: , Rfl:  .  Fluticasone-Salmeterol (ADVAIR DISKUS) 250-50 MCG/DOSE AEPB, Inhale 1 puff into the lungs 2 (two) times daily., Disp: 60 each, Rfl: 5 .  folic acid (FOLVITE) 360 MCG tablet, Take 400 mcg by mouth 2 (two) times daily., Disp: , Rfl:  .  levothyroxine (SYNTHROID) 25 MCG tablet, TAKE ONE TABLET BY MOUTH DAILY BEFORE BREAKFAST;, Disp: 90 tablet, Rfl: 3 .  metoprolol tartrate (LOPRESSOR) 25 MG tablet, TAKE TWO TABLETS BY MOUTH EVERY MORNING AND TAKE ONE TABLET BY MOUTH EVERY EVENING., Disp: 270 tablet, Rfl: 3 .  pantoprazole (PROTONIX) 40 MG tablet, Take 1 tablet (40 mg total) by mouth daily., Disp: 30 tablet, Rfl: 6 .  Probiotic Product (ALIGN) 4 MG CAPS, Take 4 mg by mouth daily., Disp: , Rfl:  .  pyridoxine (B-6) 100 MG tablet, Take 100 mg by mouth daily., Disp: , Rfl:  .  vitamin B-12 (CYANOCOBALAMIN) 500 MCG tablet, Take 1,000 mcg by mouth 2 (two) times daily. , Disp: , Rfl:   Current Facility-Administered Medications:  .  0.9 %  sodium chloride infusion, 500 mL, Intravenous, Continuous, Nandigam, Kavitha V, MD .  cloNIDine (CATAPRES) tablet 0.1 mg, 0.1 mg, Oral, Once, Nafziger, Tommi Rumps, NP     Julian Hy, DO [facility] 03/10/2020 5:49 PM

## 2020-03-21 ENCOUNTER — Other Ambulatory Visit: Payer: Self-pay | Admitting: Internal Medicine

## 2020-03-23 NOTE — Telephone Encounter (Signed)
Pt's pharmacy is requesting a refill on pantoprazole. Would Dr. Harrington Challenger like to refill this medication? Please address

## 2020-03-25 ENCOUNTER — Emergency Department (HOSPITAL_COMMUNITY)
Admission: EM | Admit: 2020-03-25 | Discharge: 2020-03-25 | Disposition: A | Discharge status: Home / Self Care | Payer: Medicare Other | Attending: Emergency Medicine | Admitting: Emergency Medicine

## 2020-03-25 ENCOUNTER — Encounter (HOSPITAL_COMMUNITY): Payer: Self-pay

## 2020-03-25 ENCOUNTER — Emergency Department (HOSPITAL_COMMUNITY): Payer: Medicare Other

## 2020-03-25 DIAGNOSIS — W19XXXA Unspecified fall, initial encounter: Secondary | ICD-10-CM | POA: Insufficient documentation

## 2020-03-25 DIAGNOSIS — E039 Hypothyroidism, unspecified: Secondary | ICD-10-CM | POA: Diagnosis not present

## 2020-03-25 DIAGNOSIS — S0990XA Unspecified injury of head, initial encounter: Secondary | ICD-10-CM | POA: Diagnosis not present

## 2020-03-25 DIAGNOSIS — Z79890 Hormone replacement therapy (postmenopausal): Secondary | ICD-10-CM | POA: Diagnosis not present

## 2020-03-25 DIAGNOSIS — J45909 Unspecified asthma, uncomplicated: Secondary | ICD-10-CM | POA: Diagnosis not present

## 2020-03-25 DIAGNOSIS — Z043 Encounter for examination and observation following other accident: Secondary | ICD-10-CM | POA: Diagnosis not present

## 2020-03-25 DIAGNOSIS — Z79899 Other long term (current) drug therapy: Secondary | ICD-10-CM | POA: Insufficient documentation

## 2020-03-25 DIAGNOSIS — Z7982 Long term (current) use of aspirin: Secondary | ICD-10-CM | POA: Diagnosis not present

## 2020-03-25 DIAGNOSIS — Z91010 Allergy to peanuts: Secondary | ICD-10-CM | POA: Insufficient documentation

## 2020-03-25 DIAGNOSIS — I10 Essential (primary) hypertension: Secondary | ICD-10-CM | POA: Diagnosis not present

## 2020-03-25 NOTE — ED Triage Notes (Signed)
Pt arrived via walk in, states her "body gave out" earlier today, causing her to fall to ground, head strike, no LOC, pt remembers event, not taking blood thinners. Aox4 in triage. Denies any vision changes.

## 2020-03-25 NOTE — Discharge Instructions (Signed)
Recommend follow-up with your primary doctor regarding your fall today.  If you develop any recurrent falls, episodes of passing out, headache, lethargy, vomiting or any concerning symptom, cardiac reassessment.

## 2020-03-26 NOTE — ED Provider Notes (Signed)
[facility] DEPT Provider Note   CSN: [id number] Arrival date & time: 03/25/20  1352     History Chief Complaint  Patient presents with  . Head Injury    Sara Medina is a 86 y.o. female.  Presented to ER with concern for fall, head trauma.  Patient reports fell this morning around 4:30 AM, felt like her body gave out on her and she went to the ground striking her head.  Did not pass out, able to remember the entirety of the event.  Has not had any difficulty walking, no chest pain or difficulty in breathing today.  Reports that her primary doctor wanted her to come to ER for evaluation due to head trauma.  Additional history present from daughter.  Reports patient is at her baseline.  Patient lives alone but has good family support.  HPI     Past Medical History:  Diagnosis Date  . ALLERGIC RHINITIS 05/04/2010   Qualifier: Diagnosis of  By: Jimmye Norman, LPN, [place] [facility]   . Arthritis   . Asthma   . Asthma, exercise induced 08/18/2010    She uses the Proventil inhaler on rare occasions not on a daily basis   . CARDIAC ARRHYTHMIA 05/04/2010   Qualifier: Diagnosis of  By: Jimmye Norman, LPN, [place]   . Chest tightness or pressure 05/13/2013  . Colon polyps   . COLONIC POLYPS 05/04/2010   Qualifier: Diagnosis of  By: Jimmye Norman, LPN, [place] Cystocele 10/02/2014  . Depression   . DIVERTICULITIS, HX OF 05/04/2010   Qualifier: Diagnosis of  By: Jimmye Norman, LPN, [place]   . Diverticulosis   . DIVERTICULOSIS, COLON 05/04/2010   Qualifier: Diagnosis of  By: Jimmye Norman, LPN, [place]   . Essential hypertension 05/04/2010   Qualifier: Diagnosis of  By: Jimmye Norman, LPN, [place]   . GERD 05/04/2010   Qualifier: Diagnosis of  By: Jimmye Norman, LPN, [place]   . GERD (gastroesophageal reflux disease)   . History of cardiovascular disorder 05/04/2010   Qualifier: Diagnosis of  By: Arnoldo Morale MD, [place] Quails   . History of diverticulitis of colon   . Hyperlipidemia     . HYPERLIPIDEMIA 05/04/2010   Qualifier: Diagnosis of  By: Jimmye Norman, LPN, [place]   . Hypertension   . Hypothyroidism 05/04/2010   Qualifier: Diagnosis of  By: Jimmye Norman, LPN, [place]   . Menopausal disorder 08/18/2010   When she went off the HRT she had severe mood disorder   . Mitral valve prolapse   . Osteoarthritis 05/04/2010   Qualifier: Diagnosis of  By: Jimmye Norman, LPN, [place]   . Palpitations 06/30/2014  . Spinal stenosis   . Thyroid disease     Patient Active Problem List   Diagnosis Date Noted  . Cystocele 10/02/2014  . Palpitations 06/30/2014  . Chest tightness or pressure 05/13/2013  . Asthma, exercise induced 08/18/2010  . Menopausal disorder 08/18/2010  . COLONIC POLYPS 05/04/2010  . Hypothyroidism 05/04/2010  . HYPERLIPIDEMIA 05/04/2010  . Essential hypertension 05/04/2010  . CARDIAC ARRHYTHMIA 05/04/2010  . ALLERGIC RHINITIS 05/04/2010  . GERD 05/04/2010  . DIVERTICULOSIS, COLON 05/04/2010  . Osteoarthritis 05/04/2010  . History of cardiovascular disorder 05/04/2010  . DIVERTICULITIS, HX OF 05/04/2010    Past Surgical History:  Procedure Laterality Date  . ABDOMINAL HYSTERECTOMY    . SHOULDER SURGERY Right   . TONSILLECTOMY    . trigger thumb Right   . TUBAL LIGATION  OB History   No obstetric history on file.     Family History  Problem Relation Age of Onset  . Ovarian cancer Mother   . Heart disease Father   . Stroke Father   . Allergies Father   . Cancer Sister        brain  . Breast cancer Maternal Aunt   . Colon cancer Neg Hx     Social History   Tobacco Use  . Smoking status: Never Smoker  . Smokeless tobacco: Never Used  Vaping Use  . Vaping Use: Never used  Substance Use Topics  . Alcohol use: Yes    Alcohol/week: 0.0 standard drinks    Comment: Couple glasses of wine per week  . Drug use: No    Home Medications Prior to Admission medications   Medication Sig Start Date End Date Taking? Authorizing Provider   albuterol (VENTOLIN HFA) 108 (90 Base) MCG/ACT inhaler INHALE 2 PUFFS BY MOUTH EVERY 6 HOURS AS NEEDED FOR WHEEZING OR FOR SHORTNESS OF BREATH 08/24/19   Burchette, Alinda Sierras, MD  amLODipine (NORVASC) 5 MG tablet Take one tablet twice daily 11/04/19   Burchette, Alinda Sierras, MD  aspirin 81 MG tablet Take 81 mg by mouth daily.      [provider]  azelastine (ASTELIN) 0.1 % nasal spray Place 2 sprays into both nostrils 2 (two) times daily. Use in each nostril as directed 12/10/19   Noemi Chapel P, DO  CALCIUM PO Take 1 capsule by mouth daily.    [provider]  Cholecalciferol (VITAMIN D-3) 25 MCG (1000 UT) CAPS Take 1,000 Units by mouth daily.    [provider]  EPINEPHrine (EPIPEN 2-PAK) 0.3 mg/0.3 mL IJ SOAJ injection Inject 0.3 mg into the muscle as needed for anaphylaxis.     [provider]  famotidine (PEPCID) 20 MG tablet Take 20 mg by mouth daily as needed (for GERD).     [provider]  Fluticasone-Salmeterol (ADVAIR DISKUS) 250-50 MCG/DOSE AEPB Inhale 1 puff into the lungs 2 (two) times daily. 10/14/19   Julian Hy, DO  folic acid (FOLVITE) 209 MCG tablet Take 400 mcg by mouth 2 (two) times daily.    [provider]  levothyroxine (SYNTHROID) 25 MCG tablet TAKE ONE TABLET BY MOUTH DAILY BEFORE BREAKFAST; 09/10/19   Burchette, Alinda Sierras, MD  metoprolol tartrate (LOPRESSOR) 25 MG tablet TAKE TWO TABLETS BY MOUTH EVERY MORNING AND TAKE ONE TABLET BY MOUTH EVERY EVENING. 09/10/19   Burchette, Alinda Sierras, MD  pantoprazole (PROTONIX) 40 MG tablet TAKE ONE TABLET BY MOUTH DAILY 03/23/20   Fay Records, MD  Probiotic Product (ALIGN) 4 MG CAPS Take 4 mg by mouth daily.    [provider]  pyridoxine (B-6) 100 MG tablet Take 100 mg by mouth daily.    [provider]  vitamin B-12 (CYANOCOBALAMIN) 500 MCG tablet Take 1,000 mcg by mouth 2 (two) times daily.     [provider]    Allergies    Other, Peanut-containing drug  products, Pecan nut (diagnostic), and Penicillins  Review of Systems   Review of Systems  Constitutional: Negative for chills and fever.  HENT: Negative for ear pain and sore throat.   Eyes: Negative for pain and visual disturbance.  Respiratory: Negative for cough and shortness of breath.   Cardiovascular: Negative for chest pain and palpitations.  Gastrointestinal: Negative for abdominal pain and vomiting.  Genitourinary: Negative for dysuria and hematuria.  Musculoskeletal: Negative for  arthralgias and back pain.  Skin: Negative for color change and rash.  Neurological: Negative for seizures and syncope.  All other systems reviewed and are negative.   Physical Exam Updated Vital Signs BP 138/77   Pulse 72   Temp 97.7 F (36.5 C) (Oral)   Resp (!) 26   SpO2 97%   Physical Exam Vitals and nursing note reviewed.  Constitutional:      General: She is not in acute distress.    Appearance: She is well-developed.  HENT:     Head: Normocephalic.     Comments: Mild ecchymosis noted to forehead, no laceration Eyes:     Conjunctiva/sclera: Conjunctivae normal.  Cardiovascular:     Rate and Rhythm: Normal rate and regular rhythm.     Heart sounds: No murmur heard.   Pulmonary:     Effort: Pulmonary effort is normal. No respiratory distress.     Breath sounds: Normal breath sounds.  Abdominal:     Palpations: Abdomen is soft.     Tenderness: There is no abdominal tenderness.  Musculoskeletal:     Cervical back: Neck supple.     Comments: Back: no C, T, L spine TTP, no step off or deformity RUE: no TTP throughout, no deformity, normal joint ROM, radial pulse intact, distal sensation and motor intact LUE: no TTP throughout, no deformity, normal joint ROM, radial pulse intact, distal sensation and motor intact RLE:  no TTP throughout, no deformity, normal joint ROM, distal pulse, sensation and motor intact LLE: no TTP throughout, no deformity, normal joint ROM, distal pulse,  sensation and motor intact  Skin:    General: Skin is warm and dry.     Capillary Refill: Capillary refill takes less than 2 seconds.  Neurological:     General: No focal deficit present.     Mental Status: She is alert and oriented to person, place, and time.  Psychiatric:        Mood and Affect: Mood normal.        Behavior: Behavior normal.     ED Results / Procedures / Treatments   Labs (all labs ordered are listed, but only abnormal results are displayed) Labs Reviewed - No data to display  EKG EKG Interpretation  Date/Time:  Wednesday March 25 2020 17:24:22 EDT Ventricular Rate:  74 PR Interval:    QRS Duration: 97 QT Interval:  404 QTC Calculation: 449 R Axis:   2 Text Interpretation: Sinus rhythm Prolonged PR interval Low voltage, precordial leads Confirmed by Madalyn Rob [phone]) on 03/25/2020 5:27:14 PM   Radiology CT Head Wo Contrast  Result Date: 03/25/2020 CLINICAL DATA:  Fall EXAM: CT HEAD WITHOUT CONTRAST TECHNIQUE: Contiguous axial images were obtained from the base of the skull through the vertex without intravenous contrast. COMPARISON:  None. FINDINGS: Brain: There is no acute intracranial hemorrhage, mass effect, or edema. Gray-white differentiation is preserved. There is no extra-axial fluid collection. Prominence of the ventricles and sulci reflects mild generalized parenchymal volume loss. Patchy hypoattenuation in the supratentorial white matter is nonspecific but probably reflects mild chronic microvascular ischemic changes. Vascular: There is atherosclerotic calcification at the skull base. Skull: Calvarium is unremarkable. Sinuses/Orbits: No acute finding. Other: None. IMPRESSION: No evidence of acute intracranial injury. Chronic/nonemergent findings detailed above. Electronically Signed   By: Macy Mis M.D.   On: 03/25/2020 16:26    Procedures Procedures (including critical care time)  Medications Ordered in ED Medications - No data to  display  ED Course  I  have reviewed the triage vital signs and the nursing notes.  Pertinent labs & imaging results that were available during my care of the patient were reviewed by me and considered in my medical decision making (see chart for details).    MDM Rules/Calculators/A&P                         86 year old lady presenting to ER after she fell this morning and hit her head.  No ongoing symptoms at present.  She is well-appearing.  No other areas of concern based on history or exam.  Her CT head was negative.  Discharged home with daughter.   After the discussed management above, the patient was determined to be safe for discharge.  The patient was in agreement with this plan and all questions regarding their care were answered.  ED return precautions were discussed and the patient will return to the ED with any significant worsening of condition.  Final Clinical Impression(s) / ED Diagnoses Final diagnoses:  Traumatic injury of head, initial encounter  Fall, initial encounter    Rx / DC Orders ED Discharge Orders    None       Lucrezia Starch, MD 03/26/20 [phone]

## 2020-03-30 DIAGNOSIS — R200 Anesthesia of skin: Secondary | ICD-10-CM | POA: Diagnosis not present

## 2020-04-07 ENCOUNTER — Other Ambulatory Visit: Payer: Self-pay | Admitting: Critical Care Medicine

## 2020-04-08 DIAGNOSIS — Z23 Encounter for immunization: Secondary | ICD-10-CM | POA: Diagnosis not present

## 2020-04-29 DIAGNOSIS — R200 Anesthesia of skin: Secondary | ICD-10-CM | POA: Diagnosis not present

## 2020-06-09 DIAGNOSIS — M5136 Other intervertebral disc degeneration, lumbar region: Secondary | ICD-10-CM | POA: Diagnosis not present

## 2020-06-09 DIAGNOSIS — M542 Cervicalgia: Secondary | ICD-10-CM | POA: Diagnosis not present

## 2020-06-09 DIAGNOSIS — M5459 Other low back pain: Secondary | ICD-10-CM | POA: Diagnosis not present

## 2020-06-09 DIAGNOSIS — M5030 Other cervical disc degeneration, unspecified cervical region: Secondary | ICD-10-CM | POA: Diagnosis not present

## 2020-06-15 ENCOUNTER — Ambulatory Visit: Payer: Medicare Other | Admitting: Neurology

## 2020-06-16 ENCOUNTER — Telehealth: Payer: Self-pay | Admitting: Neurology

## 2020-06-16 NOTE — Telephone Encounter (Signed)
Spoke with pt's daughter Izora Gala and scheduled pt for this Friday 1/21 @ 10:15 AM arrival 9:45. Canceled March appt.

## 2020-06-16 NOTE — Telephone Encounter (Signed)
Pt's daughter, Vilma Prader called, me mother's appt was cancelled due to the weather. Can we get an earlier appt than 3/21. Would like a call from the nurse.

## 2020-06-16 NOTE — Telephone Encounter (Signed)
Offer her a Friday appointment

## 2020-06-18 ENCOUNTER — Encounter: Payer: Self-pay | Admitting: *Deleted

## 2020-06-18 DIAGNOSIS — M5416 Radiculopathy, lumbar region: Secondary | ICD-10-CM | POA: Diagnosis not present

## 2020-06-19 ENCOUNTER — Encounter: Payer: Self-pay | Admitting: Neurology

## 2020-06-19 ENCOUNTER — Other Ambulatory Visit: Payer: Self-pay

## 2020-06-19 ENCOUNTER — Ambulatory Visit (INDEPENDENT_AMBULATORY_CARE_PROVIDER_SITE_OTHER): Payer: Medicare Other | Admitting: Neurology

## 2020-06-19 VITALS — BP 147/70 | HR 82 | Ht 64.0 in | Wt 141.0 lb

## 2020-06-19 DIAGNOSIS — Z131 Encounter for screening for diabetes mellitus: Secondary | ICD-10-CM | POA: Diagnosis not present

## 2020-06-19 DIAGNOSIS — R208 Other disturbances of skin sensation: Secondary | ICD-10-CM | POA: Diagnosis not present

## 2020-06-19 DIAGNOSIS — E531 Pyridoxine deficiency: Secondary | ICD-10-CM | POA: Diagnosis not present

## 2020-06-19 NOTE — Patient Instructions (Signed)
Bradley and Daroff's neurology in clinical practice (8th ed., pp. 1853- 1929). Elsevier."> Goldman-Cecil medicine (26th ed., pp. 2489- 2501). Elsevier.">  Peripheral Neuropathy Peripheral neuropathy is a type of nerve damage. It affects nerves that carry signals between the spinal cord and the arms, legs, and the rest of the body (peripheral nerves). It does not affect nerves in the spinal cord or brain. In peripheral neuropathy, one nerve or a group of nerves may be damaged. Peripheral neuropathy is a broad category that includes many specific nerve disorders, like diabetic neuropathy, hereditary neuropathy, and carpal tunnel syndrome. What are the causes? This condition may be caused by:  Diabetes. This is the most common cause of peripheral neuropathy.  Nerve injury.  Pressure or stress on a nerve that lasts a long time.  Lack (deficiency) of B vitamins. This can result from alcoholism, poor diet, or a restricted diet.  Infections.  Autoimmune diseases, such as rheumatoid arthritis and systemic lupus erythematosus.  Nerve diseases that are passed from parent to child (inherited).  Some medicines, such as cancer medicines (chemotherapy).  Poisonous (toxic) substances, such as lead and mercury.  Too little blood flowing to the legs.  Kidney disease.  Thyroid disease. In some cases, the cause of this condition is not known. What are the signs or symptoms? Symptoms of this condition depend on which of your nerves is damaged. Common symptoms include:  Loss of feeling (numbness) in the feet, hands, or both.  Tingling in the feet, hands, or both.  Burning pain.  Very sensitive skin.  Weakness.  Not being able to move a part of the body (paralysis).  Muscle twitching.  Clumsiness or poor coordination.  Loss of balance.  Not being able to control your bladder.  Feeling dizzy.  Sexual problems. How is this diagnosed? Diagnosing and finding the cause of peripheral  neuropathy can be difficult. Your health care provider will take your medical history and do a physical exam. A neurological exam will also be done. This involves checking things that are affected by your brain, spinal cord, and nerves (nervous system). For example, your health care provider will check your reflexes, how you move, and what you can feel. You may have other tests, such as:  Blood tests.  Electromyogram (EMG) and nerve conduction tests. These tests check nerve function and how well the nerves are controlling the muscles.  Imaging tests, such as CT scans or MRI to rule out other causes of your symptoms.  Removing a small piece of nerve to be examined in a lab (nerve biopsy).  Removing and examining a small amount of the fluid that surrounds the brain and spinal cord (lumbar puncture). How is this treated? Treatment for this condition may involve:  Treating the underlying cause of the neuropathy, such as diabetes, kidney disease, or vitamin deficiencies.  Stopping medicines that can cause neuropathy, such as chemotherapy.  Medicine to help relieve pain. Medicines may include: ? Prescription or over-the-counter pain medicine. ? Antiseizure medicine. ? Antidepressants. ? Pain-relieving patches that are applied to painful areas of skin.  Surgery to relieve pressure on a nerve or to destroy a nerve that is causing pain.  Physical therapy to help improve movement and balance.  Devices to help you move around (assistive devices). Follow these instructions at home: Medicines  Take over-the-counter and prescription medicines only as told by your health care provider. Do not take any other medicines without first asking your health care provider.  Do not drive or use heavy   machinery while taking prescription pain medicine. Lifestyle  Do not use any products that contain nicotine or tobacco, such as cigarettes and e-cigarettes. Smoking keeps blood from reaching damaged nerves.  If you need help quitting, ask your health care provider.  Avoid or limit alcohol. Too much alcohol can cause a vitamin B deficiency, and vitamin B is needed for healthy nerves.  Eat a healthy diet. This includes: ? Eating foods that are high in fiber, such as fresh fruits and vegetables, whole grains, and beans. ? Limiting foods that are high in fat and processed sugars, such as fried or sweet foods.   General instructions  If you have diabetes, work closely with your health care provider to keep your blood sugar under control.  If you have numbness in your feet: ? Check every day for signs of injury or infection. Watch for redness, warmth, and swelling. ? Wear padded socks and comfortable shoes. These help protect your feet.  Develop a good support system. Living with peripheral neuropathy can be stressful. Consider talking with a mental health specialist or joining a support group.  Use assistive devices and attend physical therapy as told by your health care provider. This may include using a walker or a cane.  Keep all follow-up visits as told by your health care provider. This is important.   Contact a health care provider if:  You have new signs or symptoms of peripheral neuropathy.  You are struggling emotionally from dealing with peripheral neuropathy.  Your pain is not well-controlled. Get help right away if:  You have an injury or infection that is not healing normally.  You develop new weakness in an arm or leg.  You have fallen or do so frequently. Summary  Peripheral neuropathy is when the nerves in the arms, or legs are damaged, resulting in numbness, weakness, or pain.  There are many causes of peripheral neuropathy, including diabetes, pinched nerves, vitamin deficiencies, autoimmune disease, and hereditary conditions.  Diagnosing and finding the cause of peripheral neuropathy can be difficult. Your health care provider will take your medical history, do a  physical exam, and do tests, including blood tests and nerve function tests.  Treatment involves treating the underlying cause of the neuropathy and taking medicines to help control pain. Physical therapy and assistive devices may also help. This information is not intended to replace advice given to you by your health care provider. Make sure you discuss any questions you have with your health care provider. Document Revised: 02/25/2020 Document Reviewed: 02/25/2020 Elsevier Patient Education  2021 Elsevier Inc.  

## 2020-06-19 NOTE — Telephone Encounter (Signed)
Can one of you call? If she is confused I recommend the ED

## 2020-06-19 NOTE — Telephone Encounter (Signed)
I called pt. Spoke to patient and patient's daughter Sara Medina.  Patient was confused this morning but per patient's daughter it appears to have resolved.  They were wondering if she perhaps had a TIA.  I recommended that patient proceed to the ED.  Patient's daughter declined this.  Patient's daughter would prefer to keep her appointment with Dr. Jaynee Eagles today to discuss her neck concerns.  Patient's daughter understands that the ED is the best place for acute confusion.  Patient's daughter states that if the ED is still necessary after the appointment today with Dr. Jaynee Eagles they will take her.

## 2020-06-19 NOTE — Telephone Encounter (Signed)
Pt.'s daughter Sara Medina called to ask that a RN gives her a call back regarding mom's appt. today. She states mom seems a bit confused & she's not sure if they should still come here or go to the hospital. Please advise.

## 2020-06-19 NOTE — Progress Notes (Signed)
[facility]    Provider:  Dr Lucia Gaskins Requesting Provider: Sheran Luz, MD Primary Care Provider:  Kristian Covey, MD  CC:  neuropathy  HPI:  Sara Medina is a 87 y.o. female here as requested by  Sheran Luz, MD for numbness of lower limb.  Past medical history severe spinal stenosis L3-L4 and L4-L5, spondylolisthesis L4-L5, bilateral burning in the feet possibly peripheral neuropathy for which she has been requested to be seen here in neurology.  She also has a history of cardiac arrhythmia, hypertension, asthma, GERD, hypothyroidism, osteoarthritis, hyperlipidemia.  She is here with her daughter who also provides much information. 25 years ago she noticed the symptoms slowly the symptoms have crept up, within the last 2-3 months in bed she is gettng cramps and feet feel on fire. They burn. Slowly progressively worsening. The Gabapentin helps a lot. Symmetrical, it hurts severely, has cramps, Gabapentin helps, Topricin helped. 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 Cramps in the toes. Burning on the bottom on the feet from the toes to the heels. No weakness. No gaot abnormalities. No other focal neurologic deficits, associated symptoms, inciting events or modifiable factors.   I reviewed  Sheran Luz, MD's notes: She has a past medical history of degeneration of the lumbar intervertebral discs, spinal stenosis, postoperative care, pain in the right hip joint, bilateral carpal tunnel, low back pain.  She saw Dr. Horald Chestnut for bilateral leg weakness and burning in the bilateral feet the feet issue is the worst, she was started on gabapentin, and its helping, still with some low back pain, she has more than 1 problem including a crunching sensation in her neck but denies any radicular symptoms into her arms, 2% over-the-counter cream helped for about a month and it stopped working, gabapentin is giving her relief, she injured her head and went to the  emergency room not too long ago they did not diagnose her with any because of that, occurred prior to taking the gabapentin, she is complaining of burning sensation in her feet, she is very active but the pain in her feet is not helping her ability to do things, she would rather walk in her carpet at home rather than walking out in the community on a hard concrete, her daughter bought her a walker, she has not had any more recent falls, she did get a bruise over her right orbital region since she fell, negative straight leg raise on the right, negative cross her leg raise on the left, she has pretty good cervical range of motion, strength is 5 out of 5, she does have severe spinal stenosis L3-L4 and L4-L5 and spondylolisthesis L4-L5, here for evaluation of peripheral neuropathy.  She also had an L5-S1 epidural steroid injection, she had good relief for 1 day, pain is in the back of her legs but also in the front of her legs and groin.  It appears she has had 3 L5-S1 epidural steroid injections.  Reviewed notes, labs and imaging from outside physicians, which showed:  MRI of the lumbar spine December 17, 2018 shows severe L3-L4 spinal canal stenosis, moderate to severe narrowing, moderate L4-L5 spinal canal stenosis and moderate to severe narrowing, moderate to severe right and mild left L5-S1 neural foraminal narrowing with mass-effect upon the right L5 nerve root, severe spinal stenosis at L3-L4 and L4-L5 with spondylosis at L4-L5. Reviewed images.  Review of Systems: Patient complains of symptoms per HPI as well as  the following symptoms: foot pain. Pertinent negatives and positives per HPI. All others negative.   Social History   Socioeconomic History  . Marital status: Widowed    Spouse name: Not on file  . Number of children: 3  . Years of education: Not on file  . Highest education level: Not on file  Occupational History  . Occupation: retired    Fish farm manager: RETIRED  Tobacco Use  . Smoking  status: Never Smoker  . Smokeless tobacco: Never Used  Vaping Use  . Vaping Use: Never used  Substance and Sexual Activity  . Alcohol use: Yes    Alcohol/week: 0.0 standard drinks    Comment: Couple glasses of wine per week  . Drug use: No  . Sexual activity: Not Currently  Other Topics Concern  . Not on file  Social History Narrative  . Not on file   Social Determinants of Health   Financial Resource Strain: Low Risk   . Difficulty of Paying Living Expenses: Not hard at all  Food Insecurity: No Food Insecurity  . Worried About Charity fundraiser in the Last Year: Never true  . Ran Out of Food in the Last Year: Never true  Transportation Needs: No Transportation Needs  . Lack of Transportation (Medical): No  . Lack of Transportation (Non-Medical): No  Physical Activity: Inactive  . Days of Exercise per Week: 0 days  . Minutes of Exercise per Session: 0 min  Stress: No Stress Concern Present  . Feeling of Stress : Not at all  Social Connections: Moderately Isolated  . Frequency of Communication with Friends and Family: More than three times a week  . Frequency of Social Gatherings with Friends and Family: More than three times a week  . Attends Religious Services: Never  . Active Member of Clubs or Organizations: Yes  . Attends Archivist Meetings: More than 4 times per year  . Marital Status: Widowed  Intimate Partner Violence: Not At Risk  . Fear of Current or Ex-Partner: No  . Emotionally Abused: No  . Physically Abused: No  . Sexually Abused: No    Family History  Problem Relation Age of Onset  . Ovarian cancer Mother   . Heart disease Father   . Stroke Father   . Allergies Father   . Arthritis Father   . Cancer Sister        brain  . Breast cancer Maternal Aunt   . Colon cancer Neg Hx     Past Medical History:  Diagnosis Date  . ALLERGIC RHINITIS 05/04/2010   Qualifier: Diagnosis of  By: Jimmye Norman, LPN, [facility]   . Arthritis   .  Asthma   . Asthma, exercise induced 08/18/2010    She uses the Proventil inhaler on rare occasions not on a daily basis   . CARDIAC ARRHYTHMIA 05/04/2010   Qualifier: Diagnosis of  By: Jimmye Norman, LPN, [place] Cunas   . Chest tightness or pressure 05/13/2013  . Colon polyps   . COLONIC POLYPS 05/04/2010   Qualifier: Diagnosis of  By: Jimmye Norman, LPN, [place] Cystocele 10/02/2014  . Depression   . DIVERTICULITIS, HX OF 05/04/2010   Qualifier: Diagnosis of  By: Jimmye Norman, LPN, [place] Cunas   . Diverticulosis   . DIVERTICULOSIS, COLON 05/04/2010   Qualifier: Diagnosis of  By: Jimmye Norman, LPN, [place] Cunas   . Essential hypertension 05/04/2010   Qualifier: Diagnosis of  By: Jimmye Norman, LPN, [place] Cunas   .  GERD 05/04/2010   Qualifier: Diagnosis of  By: Jimmye Norman, LPN, [place] Cunas   . GERD (gastroesophageal reflux disease)   . History of cardiovascular disorder 05/04/2010   Qualifier: Diagnosis of  By: Arnoldo Morale MD, Balinda Quails   . History of diverticulitis of colon   . Hyperlipidemia   . HYPERLIPIDEMIA 05/04/2010   Qualifier: Diagnosis of  By: Jimmye Norman, LPN, [place] Cunas   . Hypertension   . Hypothyroidism 05/04/2010   Qualifier: Diagnosis of  By: Jimmye Norman, LPN, [place] Cunas   . Menopausal disorder 08/18/2010   When she went off the HRT she had severe mood disorder   . Mitral valve prolapse   . Osteoarthritis 05/04/2010   Qualifier: Diagnosis of  By: Jimmye Norman, LPN, [place] Cunas   . Palpitations 06/30/2014  . Spinal stenosis   . Thyroid disease     Patient Active Problem List   Diagnosis Date Noted  . Burning sensation of feet 06/21/2020  . Cystocele 10/02/2014  . Palpitations 06/30/2014  . Chest tightness or pressure 05/13/2013  . Asthma, exercise induced 08/18/2010  . Menopausal disorder 08/18/2010  . COLONIC POLYPS 05/04/2010  . Hypothyroidism 05/04/2010  . HYPERLIPIDEMIA 05/04/2010  . Essential hypertension 05/04/2010  . CARDIAC ARRHYTHMIA 05/04/2010  . ALLERGIC RHINITIS 05/04/2010  . GERD 05/04/2010  . DIVERTICULOSIS,  COLON 05/04/2010  . Osteoarthritis 05/04/2010  . History of cardiovascular disorder 05/04/2010  . DIVERTICULITIS, HX OF 05/04/2010    Past Surgical History:  Procedure Laterality Date  . ABDOMINAL HYSTERECTOMY    . cataract surgery    . COLONOSCOPY    . SHOULDER SURGERY Right   . TONSILLECTOMY    . trigger thumb Right   . TUBAL LIGATION      Current Outpatient Medications  Medication Sig Dispense Refill  . amLODipine (NORVASC) 5 MG tablet Take one tablet twice daily 180 tablet 3  . aspirin 81 MG tablet Take 81 mg by mouth daily.    Marland Kitchen gabapentin (NEURONTIN) 100 MG capsule Take 100 mg by mouth at bedtime.    Marland Kitchen levothyroxine (SYNTHROID) 25 MCG tablet TAKE ONE TABLET BY MOUTH DAILY BEFORE BREAKFAST; 90 tablet 3  . metoprolol tartrate (LOPRESSOR) 25 MG tablet TAKE TWO TABLETS BY MOUTH EVERY MORNING AND TAKE ONE TABLET BY MOUTH EVERY EVENING. 270 tablet 3  . OVER THE COUNTER MEDICATION Topricin foot cream as needed    . pantoprazole (PROTONIX) 40 MG tablet TAKE ONE TABLET BY MOUTH DAILY 30 tablet 6  . Probiotic Product (ALIGN) 4 MG CAPS Take 4 mg by mouth daily.    Marland Kitchen pyridoxine (B-6) 100 MG tablet Take 100 mg by mouth daily.    . vitamin B-12 (CYANOCOBALAMIN) 500 MCG tablet Take 1,000 mcg by mouth 2 (two) times daily.     Marland Kitchen CALCIUM PO Take 1 capsule by mouth daily. (Patient not taking: Reported on 06/19/2020)    . Cholecalciferol (VITAMIN D-3) 25 MCG (1000 UT) CAPS Take 1,000 Units by mouth daily.    Marland Kitchen EPINEPHrine (EPIPEN 2-PAK) 0.3 mg/0.3 mL IJ SOAJ injection Inject 0.3 mg into the muscle as needed for anaphylaxis.     . folic acid (FOLVITE) 678 MCG tablet Take 400 mcg by mouth 2 (two) times daily. (Patient not taking: Reported on 06/19/2020)     Current Facility-Administered Medications  Medication Dose Route Frequency Provider Last Rate Last Admin  . 0.9 %  sodium chloride infusion  500 mL Intravenous Continuous Nandigam, Kavitha V, MD      . cloNIDine (CATAPRES) tablet 0.1  mg  0.1 mg  Oral Once Dorothyann Peng, NP        Allergies as of 06/19/2020 - Review Complete 06/19/2020  Allergen Reaction Noted  . Other Anaphylaxis, Shortness Of Breath, Swelling, and Rash 08/19/2019  . Peanut-containing drug products Anaphylaxis, Shortness Of Breath, Swelling, and Rash 06/21/2011  . Pecan nut (diagnostic) Anaphylaxis, Shortness Of Breath, and Rash 08/19/2019  . Penicillins Swelling 05/04/2010    Vitals: BP (!) 147/70   Pulse 82   Ht _0  (1.626 m)   Wt 141 lb (64 kg)   BMI 24.20 kg/m  Last Weight:  Wt Readings from Last 1 Encounters:  06/19/20 141 lb (64 kg)   Last Height:   Ht Readings from Last 1 Encounters:  06/19/20 _1  (1.626 m)     Physical exam: Exam: Gen: NAD, conversant, well nourised, well groomed                     CV: RRR, no MRG. No Carotid Bruits. No peripheral edema, warm, nontender Eyes: Conjunctivae clear without exudates or hemorrhage  Neuro: Detailed Neurologic Exam  Speech:    Speech is normal; fluent and spontaneous with normal comprehension.  Cognition:    The patient is oriented to person, place, and time;     recent and remote memory intact;     language fluent;     normal attention, concentration,     fund of knowledge Cranial Nerves:    The pupils are equal, round, and reactive to light. Pupils too small to visualize fundi. Visual fields are full to finger confrontation. Extraocular movements are intact. Trigeminal sensation is intact and the muscles of mastication are normal. The face is symmetric. The palate elevates in the midline. Hearing intact. Voice is normal. Shoulder shrug is normal. The tongue has normal motion without fasciculations.   Coordination:    Normal finger to nose   Gait:   Good stride and arm swing  Motor Observation:    No asymmetry, no atrophy, and no involuntary movements noted. Tone:    Normal muscle tone.    Posture:    Posture is normal. normal erect    Strength:    Strength is V/V in the  upper and lower limbs.      Sensation: decreased pin prick and temperature distally in the toes      Reflex Exam:  DTR's:    Trace AJs. Deep tendon reflexes in the upper and lower extremities are symmetrical bilaterally.   Toes:    The toes are equiv bilaterally.   Clonus:    Clonus is absent.    Assessment/Plan:   87 y.o. female here as requested by  Suella Broad, MD for numbness of lower limb.  Past medical history severe spinal stenosis L3-L4 and L4-L5, spondylolisthesis L4-L5, bilateral burning in the feet possibly peripheral neuropathy for which she has been requested to be seen here in neurology.  Discussed the causes of peripheral neuropathy, the most common being diabetes which patient does not report having. About 20 million people in the [place] states have some form of peripheral neuropathy. This is a condition that develops as a result of damage to the peripheral nervous system. Given symptoms and exam suspect a symmetric length dependent neuropathy probably small fiber axonal.. There are multiple causes eluding metabolic, toxic, infectious and endocrine disorders, small vessel disease, autoimmune diseases, and others. We'll perform extensive serum neuropathy screening. Continue Gabapentin which is helping tremendously. May also consider podiatry, heel  pain may be plantar fasciitis or other concomitant structural problem contributing to symptoms.   Orders Placed This Encounter  Procedures  . Hemoglobin A1c  . Vitamin B1  . Vitamin B6  . B12 and Folate Panel  . Methylmalonic acid, serum  . TSH  . Heavy metals, blood  . Multiple Myeloma Panel (SPEP&IFE w/QIG)  . Comprehensive metabolic panel  . CBC   No orders of the defined types were placed in this encounter.   Cc: Eulas Post, MD,  Eulas Post, MD,  Suella Broad, MD  Sarina Ill, MD  [facility] [address] [place], [place] [address]  Phone [phone] Fax  [phone]  I spent over minutes of face-to-face and non-face-to-face time with patient on the  1. Burning sensation of feet    diagnosis.  This included previsit chart review, lab review, study review, order entry, electronic health record documentation, patient education on the different diagnostic and therapeutic options, counseling and coordination of care, risks and benefits of management, compliance, or risk factor reduction

## 2020-06-20 ENCOUNTER — Telehealth: Payer: Self-pay | Admitting: Neurology

## 2020-06-20 ENCOUNTER — Other Ambulatory Visit: Payer: Self-pay | Admitting: Neurology

## 2020-06-20 DIAGNOSIS — D72829 Elevated white blood cell count, unspecified: Secondary | ICD-10-CM

## 2020-06-20 NOTE — Telephone Encounter (Signed)
Called patient spoke to her and her daughter. WBCs elevated. She had a steroid injection the day before which can explain it. She is feeling well, no urinary or other symptoms. Just monitor and will repeat next week. thanks

## 2020-06-21 ENCOUNTER — Encounter: Payer: Self-pay | Admitting: Neurology

## 2020-06-21 DIAGNOSIS — R208 Other disturbances of skin sensation: Secondary | ICD-10-CM | POA: Insufficient documentation

## 2020-06-21 HISTORY — DX: Other disturbances of skin sensation: R20.8

## 2020-06-30 ENCOUNTER — Other Ambulatory Visit (INDEPENDENT_AMBULATORY_CARE_PROVIDER_SITE_OTHER): Payer: Self-pay

## 2020-06-30 DIAGNOSIS — D72829 Elevated white blood cell count, unspecified: Secondary | ICD-10-CM

## 2020-06-30 DIAGNOSIS — Z0289 Encounter for other administrative examinations: Secondary | ICD-10-CM

## 2020-07-01 ENCOUNTER — Telehealth: Payer: Self-pay | Admitting: *Deleted

## 2020-07-01 LAB — CBC WITH DIFFERENTIAL/PLATELET
Basophils Absolute: 0.1 10*3/uL (ref 0.0–0.2)
Basos: 1 %
EOS (ABSOLUTE): 0.1 10*3/uL (ref 0.0–0.4)
Eos: 1 %
Hematocrit: 38.8 % (ref 34.0–46.6)
Hemoglobin: 13.5 g/dL (ref 11.1–15.9)
Immature Grans (Abs): 0.1 10*3/uL (ref 0.0–0.1)
Immature Granulocytes: 1 %
Lymphocytes Absolute: 1.6 10*3/uL (ref 0.7–3.1)
Lymphs: 16 %
MCH: 32.4 pg (ref 26.6–33.0)
MCHC: 34.8 g/dL (ref 31.5–35.7)
MCV: 93 fL (ref 79–97)
Monocytes Absolute: 0.8 10*3/uL (ref 0.1–0.9)
Monocytes: 8 %
Neutrophils Absolute: 7.6 10*3/uL — ABNORMAL HIGH (ref 1.4–7.0)
Neutrophils: 73 %
Platelets: 340 10*3/uL (ref 150–450)
RBC: 4.17 x10E6/uL (ref 3.77–5.28)
RDW: 12.4 % (ref 11.7–15.4)
WBC: 10.2 10*3/uL (ref 3.4–10.8)

## 2020-07-01 LAB — COMPREHENSIVE METABOLIC PANEL
ALT: 30 IU/L (ref 0–32)
AST: 21 IU/L (ref 0–40)
Albumin/Globulin Ratio: 2 (ref 1.2–2.2)
Albumin: 4.2 g/dL (ref 3.6–4.6)
Alkaline Phosphatase: 67 IU/L (ref 44–121)
BUN/Creatinine Ratio: 18 (ref 12–28)
BUN: 17 mg/dL (ref 8–27)
Bilirubin Total: 0.3 mg/dL (ref 0.0–1.2)
CO2: 22 mmol/L (ref 20–29)
Calcium: 9.5 mg/dL (ref 8.7–10.3)
Chloride: 103 mmol/L (ref 96–106)
Creatinine, Ser: 0.95 mg/dL (ref 0.57–1.00)
GFR calc Af Amer: 62 mL/min/{1.73_m2} (ref >59)
GFR calc non Af Amer: 54 mL/min/{1.73_m2} — ABNORMAL LOW (ref >59)
Globulin, Total: 2.1 g/dL (ref 1.5–4.5)
Glucose: 94 mg/dL (ref 65–99)
Potassium: 4.8 mmol/L (ref 3.5–5.2)
Sodium: 140 mmol/L (ref 134–144)
Total Protein: 6.3 g/dL (ref 6.0–8.5)

## 2020-07-01 NOTE — Telephone Encounter (Signed)
-----   Message from Melvenia Beam, MD sent at 07/01/2020  7:32 AM EST ----- Blood work looks fine, white blood cells have normalized. Dr. Jaynee Eagles

## 2020-07-01 NOTE — Telephone Encounter (Signed)
Spoke with patient and advised her labs are back and the white blood cells have normalized.  Her questions were answered and she verbalized appreciation for the call.

## 2020-07-02 ENCOUNTER — Telehealth: Payer: Self-pay | Admitting: *Deleted

## 2020-07-02 LAB — MULTIPLE MYELOMA PANEL, SERUM
Albumin SerPl Elph-Mcnc: 3.9 g/dL (ref 2.9–4.4)
Albumin/Glob SerPl: 1.3 (ref 0.7–1.7)
Alpha 1: 0.3 g/dL (ref 0.0–0.4)
Alpha2 Glob SerPl Elph-Mcnc: 0.8 g/dL (ref 0.4–1.0)
B-Globulin SerPl Elph-Mcnc: 1.2 g/dL (ref 0.7–1.3)
Gamma Glob SerPl Elph-Mcnc: 0.8 g/dL (ref 0.4–1.8)
Globulin, Total: 3.1 g/dL (ref 2.2–3.9)
IgA/Immunoglobulin A, Serum: 159 mg/dL (ref 64–422)
IgG (Immunoglobin G), Serum: 684 mg/dL (ref 586–1602)
IgM (Immunoglobulin M), Srm: 102 mg/dL (ref 26–217)

## 2020-07-02 LAB — COMPREHENSIVE METABOLIC PANEL
ALT: 22 IU/L (ref 0–32)
AST: 26 IU/L (ref 0–40)
Albumin/Globulin Ratio: 2 (ref 1.2–2.2)
Albumin: 4.7 g/dL — ABNORMAL HIGH (ref 3.6–4.6)
Alkaline Phosphatase: 68 IU/L (ref 44–121)
BUN/Creatinine Ratio: 22 (ref 12–28)
BUN: 20 mg/dL (ref 8–27)
Bilirubin Total: 0.2 mg/dL (ref 0.0–1.2)
CO2: 21 mmol/L (ref 20–29)
Calcium: 10 mg/dL (ref 8.7–10.3)
Chloride: 101 mmol/L (ref 96–106)
Creatinine, Ser: 0.92 mg/dL (ref 0.57–1.00)
GFR calc Af Amer: 64 mL/min/{1.73_m2} (ref >59)
GFR calc non Af Amer: 56 mL/min/{1.73_m2} — ABNORMAL LOW (ref >59)
Globulin, Total: 2.3 g/dL (ref 1.5–4.5)
Glucose: 96 mg/dL (ref 65–99)
Potassium: 4.4 mmol/L (ref 3.5–5.2)
Sodium: 134 mmol/L (ref 134–144)
Total Protein: 7 g/dL (ref 6.0–8.5)

## 2020-07-02 LAB — CBC
Hematocrit: 37.5 % (ref 34.0–46.6)
Hemoglobin: 13 g/dL (ref 11.1–15.9)
MCH: 32.5 pg (ref 26.6–33.0)
MCHC: 34.7 g/dL (ref 31.5–35.7)
MCV: 94 fL (ref 79–97)
Platelets: 390 10*3/uL (ref 150–450)
RBC: 4 x10E6/uL (ref 3.77–5.28)
RDW: 12.3 % (ref 11.7–15.4)
WBC: 20.7 10*3/uL (ref 3.4–10.8)

## 2020-07-02 LAB — HEAVY METALS, BLOOD
Arsenic: <1 ug/L — ABNORMAL LOW (ref 2–23)
Lead, Blood: 3 ug/dL (ref 0–4)
Mercury: 1 ug/L (ref 0.0–14.9)

## 2020-07-02 LAB — HEMOGLOBIN A1C
Est. average glucose Bld gHb Est-mCnc: 111 mg/dL
Hgb A1c MFr Bld: 5.5 % (ref 4.8–5.6)

## 2020-07-02 LAB — VITAMIN B6: Vitamin B6: 101.3 ug/L — ABNORMAL HIGH (ref 2.0–32.8)

## 2020-07-02 LAB — B12 AND FOLATE PANEL
Folate: 7.6 ng/mL (ref >3.0)
Vitamin B-12: >2000 pg/mL — ABNORMAL HIGH (ref 232–1245)

## 2020-07-02 LAB — METHYLMALONIC ACID, SERUM: Methylmalonic Acid: 130 nmol/L (ref 0–378)

## 2020-07-02 LAB — VITAMIN B1: Thiamine: 207.6 nmol/L — ABNORMAL HIGH (ref 66.5–200.0)

## 2020-07-02 LAB — TSH: TSH: 0.692 u[IU]/mL (ref 0.450–4.500)

## 2020-07-02 NOTE — Telephone Encounter (Signed)
-----   Message from Melvenia Beam, MD sent at 07/02/2020  9:31 AM EST ----- Sara Medina: please call patient and discuss her B6 is very elevated. B6 is a vitamin that can become toxic if taken in big doses (even 100mg  daily for a long period can cause toxicity) can cause a peripheral neuropathy. I would ask her to discontinue taking the B6. Taking B12 is fine and having high levels of B12 is not thought to cause problems - but the B6 can become toxic if taken at high levels. thanks

## 2020-07-02 NOTE — Telephone Encounter (Signed)
I called the pt and discussed the lab result message from Dr Jaynee Eagles. Pt verbalized understanding and stated she would stop taking B6 effective immediately. Her questions were answered. She verbalized appreciation for the call.

## 2020-07-07 DIAGNOSIS — M542 Cervicalgia: Secondary | ICD-10-CM | POA: Diagnosis not present

## 2020-07-07 DIAGNOSIS — M5412 Radiculopathy, cervical region: Secondary | ICD-10-CM | POA: Diagnosis not present

## 2020-07-17 DIAGNOSIS — M5412 Radiculopathy, cervical region: Secondary | ICD-10-CM | POA: Diagnosis not present

## 2020-08-11 DIAGNOSIS — M5412 Radiculopathy, cervical region: Secondary | ICD-10-CM | POA: Diagnosis not present

## 2020-08-13 DIAGNOSIS — Z1159 Encounter for screening for other viral diseases: Secondary | ICD-10-CM | POA: Diagnosis not present

## 2020-08-13 DIAGNOSIS — Z20828 Contact with and (suspected) exposure to other viral communicable diseases: Secondary | ICD-10-CM | POA: Diagnosis not present

## 2020-08-18 ENCOUNTER — Ambulatory Visit: Payer: Medicare Other | Admitting: Neurology

## 2020-08-20 DIAGNOSIS — Z1159 Encounter for screening for other viral diseases: Secondary | ICD-10-CM | POA: Diagnosis not present

## 2020-08-20 DIAGNOSIS — Z20828 Contact with and (suspected) exposure to other viral communicable diseases: Secondary | ICD-10-CM | POA: Diagnosis not present

## 2020-08-23 ENCOUNTER — Other Ambulatory Visit: Payer: Self-pay | Admitting: Family Medicine

## 2020-08-25 DIAGNOSIS — M791 Myalgia, unspecified site: Secondary | ICD-10-CM | POA: Diagnosis not present

## 2020-08-25 DIAGNOSIS — M5136 Other intervertebral disc degeneration, lumbar region: Secondary | ICD-10-CM | POA: Diagnosis not present

## 2020-08-25 DIAGNOSIS — G609 Hereditary and idiopathic neuropathy, unspecified: Secondary | ICD-10-CM | POA: Diagnosis not present

## 2020-08-27 DIAGNOSIS — Z20828 Contact with and (suspected) exposure to other viral communicable diseases: Secondary | ICD-10-CM | POA: Diagnosis not present

## 2020-08-27 DIAGNOSIS — Z1159 Encounter for screening for other viral diseases: Secondary | ICD-10-CM | POA: Diagnosis not present

## 2020-09-03 DIAGNOSIS — Z1159 Encounter for screening for other viral diseases: Secondary | ICD-10-CM | POA: Diagnosis not present

## 2020-09-03 DIAGNOSIS — Z20828 Contact with and (suspected) exposure to other viral communicable diseases: Secondary | ICD-10-CM | POA: Diagnosis not present

## 2020-09-10 DIAGNOSIS — Z20828 Contact with and (suspected) exposure to other viral communicable diseases: Secondary | ICD-10-CM | POA: Diagnosis not present

## 2020-09-10 DIAGNOSIS — Z1159 Encounter for screening for other viral diseases: Secondary | ICD-10-CM | POA: Diagnosis not present

## 2020-09-17 DIAGNOSIS — Z1159 Encounter for screening for other viral diseases: Secondary | ICD-10-CM | POA: Diagnosis not present

## 2020-09-17 DIAGNOSIS — Z20828 Contact with and (suspected) exposure to other viral communicable diseases: Secondary | ICD-10-CM | POA: Diagnosis not present

## 2020-09-24 DIAGNOSIS — Z23 Encounter for immunization: Secondary | ICD-10-CM | POA: Diagnosis not present

## 2020-09-24 DIAGNOSIS — Z20828 Contact with and (suspected) exposure to other viral communicable diseases: Secondary | ICD-10-CM | POA: Diagnosis not present

## 2020-09-24 DIAGNOSIS — Z1159 Encounter for screening for other viral diseases: Secondary | ICD-10-CM | POA: Diagnosis not present

## 2020-09-29 DIAGNOSIS — H43813 Vitreous degeneration, bilateral: Secondary | ICD-10-CM | POA: Diagnosis not present

## 2020-09-29 DIAGNOSIS — H16223 Keratoconjunctivitis sicca, not specified as Sjogren's, bilateral: Secondary | ICD-10-CM | POA: Diagnosis not present

## 2020-09-29 DIAGNOSIS — Z961 Presence of intraocular lens: Secondary | ICD-10-CM | POA: Diagnosis not present

## 2020-10-01 DIAGNOSIS — Z20828 Contact with and (suspected) exposure to other viral communicable diseases: Secondary | ICD-10-CM | POA: Diagnosis not present

## 2020-10-01 DIAGNOSIS — Z1159 Encounter for screening for other viral diseases: Secondary | ICD-10-CM | POA: Diagnosis not present

## 2020-10-08 DIAGNOSIS — Z1159 Encounter for screening for other viral diseases: Secondary | ICD-10-CM | POA: Diagnosis not present

## 2020-10-08 DIAGNOSIS — Z20828 Contact with and (suspected) exposure to other viral communicable diseases: Secondary | ICD-10-CM | POA: Diagnosis not present

## 2020-10-12 ENCOUNTER — Telehealth: Payer: Self-pay

## 2020-10-12 NOTE — Telephone Encounter (Signed)
Yes thanks Im accepting family members of current patients

## 2020-10-12 NOTE — Telephone Encounter (Signed)
Yes. Thanks 

## 2020-10-12 NOTE — Telephone Encounter (Signed)
Daughter is calling in stating she wanted she would like to do a TOC from Dr.Burchette to Dr.Hunter has her grandson Hal Hope) and daughter Rollen Sox) are current patients and would like to all be under one provider. Okay for TOC?

## 2020-10-13 DIAGNOSIS — M5416 Radiculopathy, lumbar region: Secondary | ICD-10-CM | POA: Diagnosis not present

## 2020-10-13 DIAGNOSIS — M48061 Spinal stenosis, lumbar region without neurogenic claudication: Secondary | ICD-10-CM | POA: Diagnosis not present

## 2020-10-15 DIAGNOSIS — Z20828 Contact with and (suspected) exposure to other viral communicable diseases: Secondary | ICD-10-CM | POA: Diagnosis not present

## 2020-10-15 DIAGNOSIS — Z1159 Encounter for screening for other viral diseases: Secondary | ICD-10-CM | POA: Diagnosis not present

## 2020-10-22 DIAGNOSIS — Z20828 Contact with and (suspected) exposure to other viral communicable diseases: Secondary | ICD-10-CM | POA: Diagnosis not present

## 2020-10-22 DIAGNOSIS — Z1159 Encounter for screening for other viral diseases: Secondary | ICD-10-CM | POA: Diagnosis not present

## 2020-10-27 ENCOUNTER — Other Ambulatory Visit: Payer: Self-pay | Admitting: Family Medicine

## 2020-10-27 ENCOUNTER — Other Ambulatory Visit: Payer: Self-pay | Admitting: Internal Medicine

## 2020-10-28 DIAGNOSIS — M5412 Radiculopathy, cervical region: Secondary | ICD-10-CM | POA: Diagnosis not present

## 2020-10-28 DIAGNOSIS — R2689 Other abnormalities of gait and mobility: Secondary | ICD-10-CM | POA: Diagnosis not present

## 2020-10-28 DIAGNOSIS — G609 Hereditary and idiopathic neuropathy, unspecified: Secondary | ICD-10-CM | POA: Diagnosis not present

## 2020-10-29 DIAGNOSIS — Z20828 Contact with and (suspected) exposure to other viral communicable diseases: Secondary | ICD-10-CM | POA: Diagnosis not present

## 2020-10-29 DIAGNOSIS — Z1159 Encounter for screening for other viral diseases: Secondary | ICD-10-CM | POA: Diagnosis not present

## 2020-11-05 DIAGNOSIS — Z20828 Contact with and (suspected) exposure to other viral communicable diseases: Secondary | ICD-10-CM | POA: Diagnosis not present

## 2020-11-05 DIAGNOSIS — Z1159 Encounter for screening for other viral diseases: Secondary | ICD-10-CM | POA: Diagnosis not present

## 2020-11-12 DIAGNOSIS — Z20828 Contact with and (suspected) exposure to other viral communicable diseases: Secondary | ICD-10-CM | POA: Diagnosis not present

## 2020-11-12 DIAGNOSIS — Z1159 Encounter for screening for other viral diseases: Secondary | ICD-10-CM | POA: Diagnosis not present

## 2020-11-19 DIAGNOSIS — Z20828 Contact with and (suspected) exposure to other viral communicable diseases: Secondary | ICD-10-CM | POA: Diagnosis not present

## 2020-11-19 DIAGNOSIS — Z1159 Encounter for screening for other viral diseases: Secondary | ICD-10-CM | POA: Diagnosis not present

## 2020-11-21 ENCOUNTER — Other Ambulatory Visit: Payer: Self-pay | Admitting: Family Medicine

## 2020-11-26 DIAGNOSIS — Z20828 Contact with and (suspected) exposure to other viral communicable diseases: Secondary | ICD-10-CM | POA: Diagnosis not present

## 2020-11-26 DIAGNOSIS — Z1159 Encounter for screening for other viral diseases: Secondary | ICD-10-CM | POA: Diagnosis not present

## 2020-12-01 DIAGNOSIS — R410 Disorientation, unspecified: Secondary | ICD-10-CM | POA: Diagnosis not present

## 2020-12-01 DIAGNOSIS — E860 Dehydration: Secondary | ICD-10-CM | POA: Diagnosis not present

## 2020-12-01 DIAGNOSIS — R4189 Other symptoms and signs involving cognitive functions and awareness: Secondary | ICD-10-CM | POA: Diagnosis not present

## 2020-12-01 DIAGNOSIS — M5459 Other low back pain: Secondary | ICD-10-CM | POA: Diagnosis not present

## 2020-12-01 DIAGNOSIS — R197 Diarrhea, unspecified: Secondary | ICD-10-CM | POA: Diagnosis not present

## 2020-12-02 DIAGNOSIS — R197 Diarrhea, unspecified: Secondary | ICD-10-CM | POA: Diagnosis not present

## 2020-12-02 DIAGNOSIS — E860 Dehydration: Secondary | ICD-10-CM | POA: Diagnosis not present

## 2020-12-03 DIAGNOSIS — Z20828 Contact with and (suspected) exposure to other viral communicable diseases: Secondary | ICD-10-CM | POA: Diagnosis not present

## 2020-12-03 DIAGNOSIS — Z1159 Encounter for screening for other viral diseases: Secondary | ICD-10-CM | POA: Diagnosis not present

## 2020-12-10 DIAGNOSIS — Z1159 Encounter for screening for other viral diseases: Secondary | ICD-10-CM | POA: Diagnosis not present

## 2020-12-10 DIAGNOSIS — Z20828 Contact with and (suspected) exposure to other viral communicable diseases: Secondary | ICD-10-CM | POA: Diagnosis not present

## 2020-12-12 DIAGNOSIS — R197 Diarrhea, unspecified: Secondary | ICD-10-CM | POA: Diagnosis not present

## 2020-12-14 DIAGNOSIS — M5459 Other low back pain: Secondary | ICD-10-CM | POA: Diagnosis not present

## 2020-12-14 DIAGNOSIS — M5416 Radiculopathy, lumbar region: Secondary | ICD-10-CM | POA: Diagnosis not present

## 2020-12-17 DIAGNOSIS — Z1159 Encounter for screening for other viral diseases: Secondary | ICD-10-CM | POA: Diagnosis not present

## 2020-12-17 DIAGNOSIS — Z20828 Contact with and (suspected) exposure to other viral communicable diseases: Secondary | ICD-10-CM | POA: Diagnosis not present

## 2020-12-17 DIAGNOSIS — M5416 Radiculopathy, lumbar region: Secondary | ICD-10-CM | POA: Diagnosis not present

## 2020-12-18 ENCOUNTER — Telehealth: Payer: Medicare Other | Admitting: Family Medicine

## 2020-12-18 ENCOUNTER — Emergency Department (HOSPITAL_COMMUNITY)
Admission: EM | Admit: 2020-12-18 | Discharge: 2020-12-18 | Disposition: A | Discharge status: Home / Self Care | Payer: Medicare Other | Attending: Emergency Medicine | Admitting: Emergency Medicine

## 2020-12-18 ENCOUNTER — Encounter (HOSPITAL_COMMUNITY): Payer: Self-pay | Admitting: Emergency Medicine

## 2020-12-18 ENCOUNTER — Emergency Department (HOSPITAL_COMMUNITY): Payer: Medicare Other

## 2020-12-18 ENCOUNTER — Telehealth: Payer: Self-pay

## 2020-12-18 DIAGNOSIS — Z86010 Personal history of colonic polyps: Secondary | ICD-10-CM | POA: Insufficient documentation

## 2020-12-18 DIAGNOSIS — Z91010 Allergy to peanuts: Secondary | ICD-10-CM | POA: Insufficient documentation

## 2020-12-18 DIAGNOSIS — E039 Hypothyroidism, unspecified: Secondary | ICD-10-CM | POA: Insufficient documentation

## 2020-12-18 DIAGNOSIS — Z79899 Other long term (current) drug therapy: Secondary | ICD-10-CM | POA: Insufficient documentation

## 2020-12-18 DIAGNOSIS — Z7982 Long term (current) use of aspirin: Secondary | ICD-10-CM | POA: Insufficient documentation

## 2020-12-18 DIAGNOSIS — R0602 Shortness of breath: Secondary | ICD-10-CM | POA: Diagnosis not present

## 2020-12-18 DIAGNOSIS — R600 Localized edema: Secondary | ICD-10-CM | POA: Diagnosis not present

## 2020-12-18 DIAGNOSIS — E860 Dehydration: Secondary | ICD-10-CM | POA: Diagnosis not present

## 2020-12-18 DIAGNOSIS — I10 Essential (primary) hypertension: Secondary | ICD-10-CM | POA: Diagnosis not present

## 2020-12-18 DIAGNOSIS — J45990 Exercise induced bronchospasm: Secondary | ICD-10-CM | POA: Insufficient documentation

## 2020-12-18 DIAGNOSIS — R197 Diarrhea, unspecified: Secondary | ICD-10-CM | POA: Diagnosis not present

## 2020-12-18 DIAGNOSIS — R059 Cough, unspecified: Secondary | ICD-10-CM | POA: Insufficient documentation

## 2020-12-18 DIAGNOSIS — R82998 Other abnormal findings in urine: Secondary | ICD-10-CM | POA: Diagnosis present

## 2020-12-18 LAB — URINALYSIS, ROUTINE W REFLEX MICROSCOPIC
Bilirubin Urine: NEGATIVE
Glucose, UA: NEGATIVE mg/dL
Hgb urine dipstick: NEGATIVE
Ketones, ur: NEGATIVE mg/dL
Leukocytes,Ua: NEGATIVE
Nitrite: NEGATIVE
Protein, ur: NEGATIVE mg/dL
Specific Gravity, Urine: 1.002 — ABNORMAL LOW (ref 1.005–1.030)
pH: 7 (ref 5.0–8.0)

## 2020-12-18 LAB — CBC WITH DIFFERENTIAL/PLATELET
Abs Immature Granulocytes: 0.05 10*3/uL (ref 0.00–0.07)
Basophils Absolute: 0.1 10*3/uL (ref 0.0–0.1)
Basophils Relative: 1 %
Eosinophils Absolute: 0.1 10*3/uL (ref 0.0–0.5)
Eosinophils Relative: 1 %
HCT: 37.7 % (ref 36.0–46.0)
Hemoglobin: 12.9 g/dL (ref 12.0–15.0)
Immature Granulocytes: 1 %
Lymphocytes Relative: 13 %
Lymphs Abs: 1.2 10*3/uL (ref 0.7–4.0)
MCH: 32.2 pg (ref 26.0–34.0)
MCHC: 34.2 g/dL (ref 30.0–36.0)
MCV: 94 fL (ref 80.0–100.0)
Monocytes Absolute: 0.7 10*3/uL (ref 0.1–1.0)
Monocytes Relative: 7 %
Neutro Abs: 7.2 10*3/uL (ref 1.7–7.7)
Neutrophils Relative %: 77 %
Platelets: 332 10*3/uL (ref 150–400)
RBC: 4.01 MIL/uL (ref 3.87–5.11)
RDW: 12.7 % (ref 11.5–15.5)
WBC: 9.3 10*3/uL (ref 4.0–10.5)
nRBC: 0 % (ref 0.0–0.2)

## 2020-12-18 LAB — COMPREHENSIVE METABOLIC PANEL
ALT: 24 U/L (ref 0–44)
AST: 24 U/L (ref 15–41)
Albumin: 3.8 g/dL (ref 3.5–5.0)
Alkaline Phosphatase: 54 U/L (ref 38–126)
Anion gap: 11 (ref 5–15)
BUN: 11 mg/dL (ref 8–23)
CO2: 21 mmol/L — ABNORMAL LOW (ref 22–32)
Calcium: 9 mg/dL (ref 8.9–10.3)
Chloride: 98 mmol/L (ref 98–111)
Creatinine, Ser: 0.79 mg/dL (ref 0.44–1.00)
GFR, Estimated: >60 mL/min (ref >60)
Glucose, Bld: 120 mg/dL — ABNORMAL HIGH (ref 70–99)
Potassium: 4 mmol/L (ref 3.5–5.1)
Sodium: 130 mmol/L — ABNORMAL LOW (ref 135–145)
Total Bilirubin: 0.6 mg/dL (ref 0.3–1.2)
Total Protein: 6 g/dL — ABNORMAL LOW (ref 6.5–8.1)

## 2020-12-18 LAB — MAGNESIUM: Magnesium: 1.7 mg/dL (ref 1.7–2.4)

## 2020-12-18 LAB — TROPONIN I (HIGH SENSITIVITY): Troponin I (High Sensitivity): 5 ng/L (ref <18)

## 2020-12-18 MED ORDER — LACTATED RINGERS IV BOLUS
1000.0000 mL | Freq: Once | INTRAVENOUS | Status: AC
  Administered 2020-12-18: 1000 mL via INTRAVENOUS

## 2020-12-18 NOTE — Discharge Instructions (Addendum)
You were evaluated for your diarrhea.  I examination suggested that you are mildly dehydrated, for which we gave you some fluids.  Our work-up did not reveal any abnormalities to suggest significantly threatening disease.  Given that you have not yet had follow-up with gastroenterology, I attached the number above for the [facility] gastroenterology.  Please call them tomorrow to set up an appointment.  In the meantime, continue hydrating with electrolyte rich fluids as you have been.  Please return to the ER for worsening symptoms such as fevers, chills, vomiting, worsening abdominal pain, bloody stools.

## 2020-12-18 NOTE — ED Provider Notes (Signed)
Emergency Medicine Provider Triage Evaluation Note  Sara Medina , a 87 y.o. female  was evaluated in triage.  Pt complains of decreased urine output. Patient has had problems with diarrhea for about 1 month, too numerous to count episodes per day, no blood present in it. Seen @ UC for this w/ labs & stool sample testing that was overall reassuring. She is here today however because she has not been urinating much over the past few days, only small drops. No recent foreign travel or abx use. New medications include cymbalta.   Review of Systems  Positive: Diarrhea, decreased urination Negative: Abdominal pain, fever, melena, hematochezia  Physical Exam  BP (!) 153/105   Pulse 70   Temp 98 F (36.7 C) (Oral)   Resp 16   SpO2 97%  Gen:   Awake, no distress   Resp:  Normal effort  MSK:   Moves extremities without difficulty  Other:  No peritoneal signs on abdominal exam.   Medical Decision Making  Medically screening exam initiated at 10:48 AM.  Appropriate orders placed.  Sara Medina was informed that the remainder of the evaluation will be completed by another provider, this initial triage assessment does not replace that evaluation, and the importance of remaining in the ED until their evaluation is complete.  Diarrhea, decreased urination.    Amaryllis Dyke, PA-C 12/18/20 1051    Pattricia Boss, MD 12/28/20 1348

## 2020-12-18 NOTE — Telephone Encounter (Signed)
Patient and her daughter is currently at the ER because she didn't want a virtual visit.

## 2020-12-18 NOTE — ED Provider Notes (Signed)
[facility] EMERGENCY DEPARTMENT Provider Note   CSN: [id number] Arrival date & time: 12/18/20  1033     History Chief Complaint  Patient presents with   Diarrhea    Sara Medina is a 87 y.o. female.   Diarrhea  87 year old female PMHx asthma, arrhythmia, HLD, diverticulosis, HTN, hypothyroid, MVP, presenting for decreased urine output and nonbloody diarrhea x2.5 weeks.  She reports she has 2-6 episodes a day.  Associated symptoms include mild nonorthostatic shortness of breath, minimally productive cough.  States urine has been intermittently dark, but no pain.  Seen in UC x2 (7/5 and last Saturday) tried a [place] diet and gluten-free diet without any improvement. Negative labs and stool testing using.  Of note, recently started cymbalta.  Mild baseline pedal edema.  No further medical concern at this time including fevers, sweats, sore throat, rhinorrhea, chest pain, palpitations, abdominal pain, N/V, dysuria, headaches, audiovisual change.  History obtained from patient, daughter, chart review.    Past Medical History:  Diagnosis Date   ALLERGIC RHINITIS 05/04/2010   Qualifier: Diagnosis of  By: Jimmye Norman, LPN, Winfield Cunas    Allergy    Arthritis    Asthma    Asthma, exercise induced 08/18/2010    She uses the Proventil inhaler on rare occasions not on a daily basis    CARDIAC ARRHYTHMIA 05/04/2010   Qualifier: Diagnosis of  By: Jimmye Norman, LPN, Winfield Cunas    Chest tightness or pressure 05/13/2013   Colon polyps    COLONIC POLYPS 05/04/2010   Qualifier: Diagnosis of  By: Jimmye Norman, LPN, Winfield Cunas    Cystocele 10/02/2014   Depression    DIVERTICULITIS, HX OF 05/04/2010   Qualifier: Diagnosis of  By: Jimmye Norman, LPN, Bonnye M    Diverticulosis    DIVERTICULOSIS, COLON 05/04/2010   Qualifier: Diagnosis of  By: Jimmye Norman LPN, Winfield Cunas    Essential hypertension 05/04/2010   Qualifier: Diagnosis of  By: Jimmye Norman, LPN, Winfield Cunas    GERD 05/04/2010   Qualifier: Diagnosis of   By: Jimmye Norman, LPN, Winfield Cunas    GERD (gastroesophageal reflux disease)    History of cardiovascular disorder 05/04/2010   Qualifier: Diagnosis of  By: Arnoldo Morale MD, John E    History of diverticulitis of colon    Hyperlipidemia    HYPERLIPIDEMIA 05/04/2010   Qualifier: Diagnosis of  By: Jimmye Norman, LPN, Winfield Cunas    Hypertension    Hypothyroidism 05/04/2010   Qualifier: Diagnosis of  By: Jimmye Norman, LPN, Winfield Cunas    Menopausal disorder 08/18/2010   When she went off the HRT she had severe mood disorder    Mitral valve prolapse    Osteoarthritis 05/04/2010   Qualifier: Diagnosis of  By: Jimmye Norman, LPN, Winfield Cunas    Palpitations 06/30/2014   Spinal stenosis    Thyroid disease     Patient Active Problem List   Diagnosis Date Noted   Burning sensation of feet 06/21/2020   Cystocele 10/02/2014   Palpitations 06/30/2014   Chest tightness or pressure 05/13/2013   Asthma, exercise induced 08/18/2010   Menopausal disorder 08/18/2010   COLONIC POLYPS 05/04/2010   Hypothyroidism 05/04/2010   HYPERLIPIDEMIA 05/04/2010   Essential hypertension 05/04/2010   CARDIAC ARRHYTHMIA 05/04/2010   ALLERGIC RHINITIS 05/04/2010   GERD 05/04/2010   DIVERTICULOSIS, COLON 05/04/2010   Osteoarthritis 05/04/2010   History of cardiovascular disorder 05/04/2010   DIVERTICULITIS, HX OF 05/04/2010    Past Surgical History:  Procedure Laterality Date   ABDOMINAL HYSTERECTOMY  cataract surgery     COLONOSCOPY     SHOULDER SURGERY Right    TONSILLECTOMY     trigger thumb Right    TUBAL LIGATION       OB History   No obstetric history on file.     Family History  Problem Relation Age of Onset   Ovarian cancer Mother    Heart disease Father    Stroke Father    Allergies Father    Arthritis Father    Cancer Sister        brain   Breast cancer Maternal Aunt    Colon cancer Neg Hx     Social History   Tobacco Use   Smoking status: Never   Smokeless tobacco: Never  Vaping Use   Vaping Use: Never  used  Substance Use Topics   Alcohol use: Yes    Alcohol/week: 0.0 standard drinks    Comment: Couple glasses of wine per week   Drug use: No    Home Medications Prior to Admission medications   Medication Sig Start Date End Date Taking? Authorizing Provider  amLODipine (NORVASC) 5 MG tablet TAKE ONE TABLET BY MOUTH TWICE A DAY 10/27/20   Burchette, Alinda Sierras, MD  aspirin 81 MG tablet Take 81 mg by mouth daily.    [provider]  CALCIUM PO Take 1 capsule by mouth daily. Patient not taking: Reported on 06/19/2020    [provider]  Cholecalciferol (VITAMIN D-3) 25 MCG (1000 UT) CAPS Take 1,000 Units by mouth daily.    [provider]  EPINEPHrine (EPIPEN 2-PAK) 0.3 mg/0.3 mL IJ SOAJ injection Inject 0.3 mg into the muscle as needed for anaphylaxis.     [provider]  folic acid (FOLVITE) A999333 MCG tablet Take 400 mcg by mouth 2 (two) times daily. Patient not taking: Reported on 06/19/2020    [provider]  gabapentin (NEURONTIN) 100 MG capsule Take 100 mg by mouth at bedtime.    [provider]  levothyroxine (SYNTHROID) 25 MCG tablet TAKE ONE TABLET BY MOUTH DAILY BEFORE BREAKFAST 11/23/20   Burchette, Alinda Sierras, MD  metoprolol tartrate (LOPRESSOR) 25 MG tablet TAKE TWO TABLETS BY MOUTH EVERY MORNING AND TAKE ONE TABLET BY MOUTH EVERY EVENING 11/23/20   Burchette, Alinda Sierras, MD  OVER THE COUNTER MEDICATION Topricin foot cream as needed    [provider]  pantoprazole (PROTONIX) 40 MG tablet TAKE ONE TABLET BY MOUTH DAILY 10/28/20   Fay Records, MD  Probiotic Product (ALIGN) 4 MG CAPS Take 4 mg by mouth daily.    [provider]  vitamin B-12 (CYANOCOBALAMIN) 500 MCG tablet Take 1,000 mcg by mouth 2 (two) times daily.     [provider]    Allergies    Other, Peanut-containing drug products, Pecan nut (diagnostic), and Penicillins  Review of Systems   Review of Systems  Gastrointestinal:  Positive for  diarrhea.  All other systems reviewed and are negative.  Physical Exam Updated Vital Signs BP (!) 157/68   Pulse 60   Temp (!) 97.5 F (36.4 C) (Oral)   Resp 12   SpO2 98%   Physical Exam Vitals and nursing note reviewed.  Constitutional:      General: She is not in acute distress. HENT:     Head: Normocephalic and atraumatic.  Eyes:     Extraocular Movements: Extraocular movements intact.     Conjunctiva/sclera: Conjunctivae normal.  Cardiovascular:     Rate and Rhythm:  Normal rate and regular rhythm.     Heart sounds: Murmur heard.    No friction rub. No gallop.  Pulmonary:     Effort: Pulmonary effort is normal.     Breath sounds: No stridor. No wheezing, rhonchi or rales.  Abdominal:     General: There is no distension.     Palpations: Abdomen is soft.     Tenderness: There is no abdominal tenderness. There is no right CVA tenderness, left CVA tenderness, guarding or rebound.  Musculoskeletal:        General: No deformity or signs of injury.     Cervical back: Normal range of motion. No rigidity.     Comments: Scant BLE edema  Skin:    General: Skin is warm and dry.  Neurological:     Mental Status: She is alert and oriented to person, place, and time. Mental status is at baseline.  Psychiatric:        Mood and Affect: Mood normal.        Behavior: Behavior normal.    ED Results / Procedures / Treatments   Labs (all labs ordered are listed, but only abnormal results are displayed) Labs Reviewed  COMPREHENSIVE METABOLIC PANEL - Abnormal; Notable for the following components:      Result Value   Sodium 130 (*)    CO2 21 (*)    Glucose, Bld 120 (*)    Total Protein 6.0 (*)    All other components within normal limits  URINALYSIS, ROUTINE W REFLEX MICROSCOPIC - Abnormal; Notable for the following components:   Color, Urine COLORLESS (*)    Specific Gravity, Urine 1.002 (*)    All other components within normal limits  CBC WITH DIFFERENTIAL/PLATELET   MAGNESIUM  TROPONIN I (HIGH SENSITIVITY)    EKG None  Radiology DG Chest Portable 1 View  Result Date: 12/18/2020 CLINICAL DATA:  Shortness of breath. EXAM: PORTABLE CHEST 1 VIEW COMPARISON:  August 19, 2019. FINDINGS: The heart size and mediastinal contours are within normal limits. Both lungs are clear. The visualized skeletal structures are unremarkable. IMPRESSION: No active disease. Electronically Signed   By: Marijo Conception M.D.   On: 12/18/2020 16:26    Procedures Procedures   Medications Ordered in ED Medications  lactated ringers bolus 1,000 mL (0 mLs Intravenous Stopped 12/18/20 1803)    ED Course  I have reviewed the triage vital signs and the nursing notes.  Pertinent labs & imaging results that were available during my care of the patient were reviewed by me and considered in my medical decision making (see chart for details).    MDM Rules/Calculators/A&P                          This is an 87 year old female PMHx asthma, arrhythmia, HLD, diverticulosis, HTN, hypothyroid, MVP, presenting for nonbloody diarrhea for the last 2 and half weeks, now with some decrease in urination.  She continues to be able to tolerate p.o.  Has been evaluated by urgent care x2, but not yet seen by GI.  On exam, she does appear mildly dehydrated, but is HDS and afebrile.  Abdominal examination is benign.  Initial interventions: LR bolus 1 L administered  All studies independently reviewed by myself, d/w the attending physician, factored into my MDM. -EKG: NSR 61 bpm, normal axis, first-degree AV block but otherwise normal intervals, no acute ST/T changes, essentially unchanged compared to prior from 2021 -Unremarkable: CXR, CBCd, CMP,  magnesium, urinalysis, troponin x1  Presentation appears most consistent with chronic diarrhea of uncertain etiology.  Benign serial abdominal examinations without any distention or rebound.  No leukocytosis or anemia on CBC, no systemic infectious  symptoms.  CXR reassuring.  No significant electrolyte derangement on metabolic panel and magnesium.  No UTI on urinalysis.  Troponin WNL.  EKG without any significant arrhythmia or ischemic changes.  Therefore, feel the patient stable for discharge home with outpatient GI follow-up.  Return precautions discussed.  Patient and daughter understand and agree.  Patient HDS on reevaluation, subsequently discharged.  Final Clinical Impression(s) / ED Diagnoses Final diagnoses:  Diarrhea, unspecified type  Dehydration    Rx / DC Orders ED Discharge Orders     None        Levin Bacon, MD 12/19/20 Francene Castle    Lajean Saver, MD 12/21/20 [phone]

## 2020-12-18 NOTE — ED Triage Notes (Signed)
Pt has been having diarrhea since July 5th. Seen at UC twice to have labs and stool test with no answers. Pt reports lack of urine output.

## 2020-12-18 NOTE — Telephone Encounter (Signed)
Patient's daughter called asking to be speak a nurse. Pt has a TOC with Dr Yong Channel in August. She stated that Diary is uncomfortable with Dr Elease Hashimoto

## 2020-12-21 ENCOUNTER — Encounter: Payer: Self-pay | Admitting: Gastroenterology

## 2020-12-21 ENCOUNTER — Telehealth: Payer: Self-pay

## 2020-12-21 ENCOUNTER — Telehealth: Payer: Self-pay | Admitting: Family Medicine

## 2020-12-21 NOTE — Telephone Encounter (Signed)
Patient's daughter is calling in stating that they were advised to follow up with PCP and a gastro doctor after ED visit. Has a TOC on 8/12 with Hunter, but [place] isnt able to get in with [facility] until August, and is wondering if Dr.Hunter is able to get them in sooner at [facility]. Did advise daughter to contact PCP as well to see if there is anything they can do.

## 2020-12-21 NOTE — Telephone Encounter (Signed)
Patients daughter called to see if we can get the patient into [facility] sooner than 08/23.   The patient was seen in the ED 07/22 - 07/23 and they recommended that the patient is seen ASAP  Please advise

## 2020-12-22 NOTE — Telephone Encounter (Signed)
Spoke with daughter and advise her that since this patient hasn't become a patient of Dr. Ronney Lion yet he wouldn't be able to get her in faster, she would have to speak with her Mothers current PCP. She gave a verbal understanding.

## 2020-12-24 ENCOUNTER — Other Ambulatory Visit: Payer: Self-pay

## 2020-12-24 DIAGNOSIS — Z1159 Encounter for screening for other viral diseases: Secondary | ICD-10-CM | POA: Diagnosis not present

## 2020-12-24 DIAGNOSIS — Z20828 Contact with and (suspected) exposure to other viral communicable diseases: Secondary | ICD-10-CM | POA: Diagnosis not present

## 2020-12-25 ENCOUNTER — Ambulatory Visit (INDEPENDENT_AMBULATORY_CARE_PROVIDER_SITE_OTHER): Payer: Medicare Other | Admitting: Family Medicine

## 2020-12-25 ENCOUNTER — Encounter: Payer: Self-pay | Admitting: Family Medicine

## 2020-12-25 VITALS — BP 120/80 | HR 76 | Resp 16 | Ht 64.0 in | Wt 137.2 lb

## 2020-12-25 DIAGNOSIS — G3184 Mild cognitive impairment, so stated: Secondary | ICD-10-CM | POA: Diagnosis not present

## 2020-12-25 DIAGNOSIS — K219 Gastro-esophageal reflux disease without esophagitis: Secondary | ICD-10-CM | POA: Diagnosis not present

## 2020-12-25 DIAGNOSIS — R197 Diarrhea, unspecified: Secondary | ICD-10-CM

## 2020-12-25 DIAGNOSIS — R1310 Dysphagia, unspecified: Secondary | ICD-10-CM

## 2020-12-25 DIAGNOSIS — E039 Hypothyroidism, unspecified: Secondary | ICD-10-CM | POA: Diagnosis not present

## 2020-12-25 LAB — BASIC METABOLIC PANEL
BUN: 19 mg/dL (ref 6–23)
CO2: 25 mEq/L (ref 19–32)
Calcium: 9.4 mg/dL (ref 8.4–10.5)
Chloride: 102 mEq/L (ref 96–112)
Creatinine, Ser: 0.81 mg/dL (ref 0.40–1.20)
GFR: 64.58 mL/min (ref >60.00)
Glucose, Bld: 109 mg/dL — ABNORMAL HIGH (ref 70–99)
Potassium: 4.3 mEq/L (ref 3.5–5.1)
Sodium: 137 mEq/L (ref 135–145)

## 2020-12-25 LAB — TSH: TSH: 1.6 u[IU]/mL (ref 0.35–5.50)

## 2020-12-25 NOTE — Patient Instructions (Addendum)
A few things to remember from today's visit:   If you need refills please call your pharmacy. Do not use My Chart to request refills or for acute issues that need immediate attention.   Continue electrolyte containing fluids, small and frequent sips. Fall precautions. Decreased Pantoprazole to 1/2 tab. Wean off Cymbalta as planned. Keep appt with gastro and PCP.  Please be sure medication list is accurate. If a new problem present, please set up appointment sooner than planned today.

## 2020-12-25 NOTE — Progress Notes (Signed)
ACUTE VISIT Chief Complaint  Patient presents with   Follow-up    Seen in ED for diarrhea & dehydration. Patient's GI appointment has been moved up to 12/30/20.   HPI: [name] is a very pleasant 87 y.o. female with history of spinal stenosis, cervical radiculopathy, hypothyroidism, GERD, hypertension, and asthma here today with her daughter complaining of worsening diarrhea as described above. She also has history of hearing loss, so her daughter helps me with obtaining and complementing history.  Evaluated in the ED for diarrhea on 12/18/20.  She has had diarrhea for years, usually 1-3 stools daily but for over a month she has had at least 6, sometimes watery stools, daily. Negative for new stressor, dietary changes, or anxiety. No recent trauma or or sick contact. She has not been on antibiotics for the past 3 months or more.  Sometimes having episodes of stool incontinence. She has not noted mucus or blood. She has not identified exacerbating or alleviating factors.   Diarrhea  This is a recurrent problem. The current episode started more than 1 month ago. The problem occurs 5 to 10 times per day. The stool consistency is described as Watery. The patient states that diarrhea does not awaken her from sleep. Associated symptoms include coughing. Pertinent negatives include no abdominal pain, bloating, chills, fever, increased  flatus, sweats, URI or vomiting. Nothing aggravates the symptoms. There are no known risk factors.  She has had stool studies done: Negative C. difficile, Giardia, and ova/parasites analysis.  She is keeping up with fluids, sport drinks. LE cramps, intermittent. Feeling better since she received IVF in the ER. OTC imodium x 4 days did not help.  She was started on Cymbalta 30 mg daily 10/28/2020 to treat lumbar radiculopathy. She takes pantoprazole 40 mg daily for GERD. Reporting 2 to 3 months of intermittent dysphagia for solids.  Nonproductive  cough, these seem to be a chronic problem. Negative for wheezing. She has had some shortness of breath for which she has seen cardiologist and is stable.  CXR on 12/18/20 was negative for active disease.  Lab Results  Component Value Date   CREATININE 0.79 12/18/2020   BUN 11 12/18/2020   NA 130 (L) 12/18/2020   K 4.0 12/18/2020   CL 98 12/18/2020   CO2 21 (L) 12/18/2020   Lab Results  Component Value Date   WBC 9.3 12/18/2020   HGB 12.9 12/18/2020   HCT 37.7 12/18/2020   MCV 94.0 12/18/2020   PLT 332 12/18/2020  Hypothyroidism: Currently she is on levothyroxine 25 mcg daily. She has not noted tremor or palpitations.  Lab Results  Component Value Date   TSH 0.692 06/19/2020   Since earlier this year she has had episodes of confusion, which seems to be progressing rapidly. She has difficulty with numbers and dates. She is still able to keep up with politics and news. One episode of visual hallucination a few weeks ago, she saw some of her already deceased relatives with her in her room for a few minutes.  She states that she knew that "it was not real." She has an appointment with neurologist. She had a head CT on 03/25/2020 after a fall that was negative for acute intracranial process.  Gray-white differentiation is preserved.  There is no extra-axial fluid collection.  Prominence of the ventricles and sulci reflect mild generalized parenchymal volume loss.  Patchy hypoattenuation in the supratentorial white matter is nonspecific but probably reflects mild chronic microvascular ischemic changes.  Review of Systems  Constitutional:  Negative for chills and fever.  HENT:  Negative for mouth sores, nosebleeds and sore throat.   Respiratory:  Positive for cough.   Gastrointestinal:  Positive for diarrhea. Negative for abdominal pain, bloating, flatus and vomiting.  Endocrine: Negative for cold intolerance and heat intolerance.  Genitourinary:  Negative for decreased urine volume,  dysuria and hematuria.  Musculoskeletal:  Positive for back pain and gait problem.  Skin:  Negative for pallor and rash.  Neurological:  Negative for syncope, facial asymmetry and weakness.  Psychiatric/Behavioral:  Positive for confusion. Negative for behavioral problems.   Rest see pertinent positives and negatives per HPI.  Current Outpatient Medications on File Prior to Visit  Medication Sig Dispense Refill   amLODipine (NORVASC) 5 MG tablet TAKE ONE TABLET BY MOUTH TWICE A DAY 180 tablet 0   aspirin 81 MG tablet Take 81 mg by mouth daily.     Cholecalciferol (VITAMIN D-3) 25 MCG (1000 UT) CAPS Take 1,000 Units by mouth daily.     DULoxetine (CYMBALTA) 30 MG capsule Take 30 mg by mouth daily.     EPINEPHrine 0.3 mg/0.3 mL IJ SOAJ injection Inject 0.3 mg into the muscle as needed for anaphylaxis.      gabapentin (NEURONTIN) 100 MG capsule Take 100 mg by mouth at bedtime.     levothyroxine (SYNTHROID) 25 MCG tablet TAKE ONE TABLET BY MOUTH DAILY BEFORE BREAKFAST 90 tablet 0   metoprolol tartrate (LOPRESSOR) 25 MG tablet TAKE TWO TABLETS BY MOUTH EVERY MORNING AND TAKE ONE TABLET BY MOUTH EVERY EVENING 270 tablet 0   OVER THE COUNTER MEDICATION Topricin foot cream as needed     pantoprazole (PROTONIX) 40 MG tablet TAKE ONE TABLET BY MOUTH DAILY 30 tablet 2   Probiotic Product (ALIGN) 4 MG CAPS Take 4 mg by mouth daily.     vitamin B-12 (CYANOCOBALAMIN) 500 MCG tablet Take 1,000 mcg by mouth 2 (two) times daily.      CALCIUM PO Take 1 capsule by mouth daily. (Patient not taking: No sig reported)     folic acid (FOLVITE) A999333 MCG tablet Take 400 mcg by mouth 2 (two) times daily. (Patient not taking: No sig reported)     Current Facility-Administered Medications on File Prior to Visit  Medication Dose Route Frequency Provider Last Rate Last Admin   0.9 %  sodium chloride infusion  500 mL Intravenous Continuous Nandigam, Kavitha V, MD       cloNIDine (CATAPRES) tablet 0.1 mg  0.1 mg Oral Once  Dorothyann Peng, NP         Past Medical History:  Diagnosis Date   ALLERGIC RHINITIS 05/04/2010   Qualifier: Diagnosis of  By: Jimmye Norman, LPN, Winfield Cunas    Allergy    Arthritis    Asthma    Asthma, exercise induced 08/18/2010    She uses the Proventil inhaler on rare occasions not on a daily basis    CARDIAC ARRHYTHMIA 05/04/2010   Qualifier: Diagnosis of  By: Jimmye Norman, LPN, Winfield Cunas    Chest tightness or pressure 05/13/2013   Colon polyps    COLONIC POLYPS 05/04/2010   Qualifier: Diagnosis of  By: Jimmye Norman, LPN, Winfield Cunas    Cystocele 10/02/2014   Depression    DIVERTICULITIS, HX OF 05/04/2010   Qualifier: Diagnosis of  By: Jimmye Norman, LPN, Winfield Cunas    Diverticulosis    DIVERTICULOSIS, COLON 05/04/2010   Qualifier: Diagnosis of  By: Jimmye Norman LPN, Winfield Cunas  Essential hypertension 05/04/2010   Qualifier: Diagnosis of  By: Jimmye Norman, LPN, Winfield Cunas    GERD 05/04/2010   Qualifier: Diagnosis of  By: Jimmye Norman, LPN, Winfield Cunas    GERD (gastroesophageal reflux disease)    History of cardiovascular disorder 05/04/2010   Qualifier: Diagnosis of  By: Arnoldo Morale MD, Balinda Quails    History of diverticulitis of colon    Hyperlipidemia    HYPERLIPIDEMIA 05/04/2010   Qualifier: Diagnosis of  By: Jimmye Norman, LPN, Winfield Cunas    Hypertension    Hypothyroidism 05/04/2010   Qualifier: Diagnosis of  By: Jimmye Norman, LPN, Winfield Cunas    Menopausal disorder 08/18/2010   When she went off the HRT she had severe mood disorder    Mitral valve prolapse    Osteoarthritis 05/04/2010   Qualifier: Diagnosis of  By: Jimmye Norman, LPN, Winfield Cunas    Palpitations 06/30/2014   Spinal stenosis    Thyroid disease    Allergies  Allergen Reactions   Other Anaphylaxis, Shortness Of Breath, Swelling and Rash    ALL NUTS!!!!   Peanut-Containing Drug Products Anaphylaxis, Shortness Of Breath, Swelling and Rash   Pecan Nut (Diagnostic) Anaphylaxis, Shortness Of Breath and Rash   Penicillins Swelling    Full facial swelling Did it involve swelling of  the face/tongue/throat, SOB, or low BP? Yes Did it involve sudden or severe rash/hives, skin peeling, or any reaction on the inside of your mouth or nose? Yes Did you need to seek medical attention at a hospital or doctor's office? No When did it last happen? "many years ago" If all above answers are "NO", may proceed with cephalosporin use.     Social History   Socioeconomic History   Marital status: Widowed    Spouse name: Not on file   Number of children: 3   Years of education: Not on file   Highest education level: Not on file  Occupational History   Occupation: retired    Fish farm manager: RETIRED  Tobacco Use   Smoking status: Never   Smokeless tobacco: Never  Vaping Use   Vaping Use: Never used  Substance and Sexual Activity   Alcohol use: Yes    Alcohol/week: 0.0 standard drinks    Comment: Couple glasses of wine per week   Drug use: No   Sexual activity: Not Currently  Other Topics Concern   Not on file  Social History Narrative   Not on file   Social Determinants of Health   Financial Resource Strain: Low Risk    Difficulty of Paying Living Expenses: Not hard at all  Food Insecurity: No Food Insecurity   Worried About Charity fundraiser in the Last Year: Never true   [place] in the Last Year: Never true  Transportation Needs: No Transportation Needs   Lack of Transportation (Medical): No   Lack of Transportation (Non-Medical): No  Physical Activity: Inactive   Days of Exercise per Week: 0 days   Minutes of Exercise per Session: 0 min  Stress: No Stress Concern Present   Feeling of Stress : Not at all  Social Connections: Moderately Isolated   Frequency of Communication with Friends and Family: More than three times a week   Frequency of Social Gatherings with Friends and Family: More than three times a week   Attends Religious Services: Never   Marine scientist or Organizations: Yes   Attends Music therapist: More than 4 times per  year   Marital Status: Widowed  Vitals:   12/25/20 1119  BP: 120/80  Pulse: 76  Resp: 16  SpO2: 97%   Body mass index is 23.56 kg/m.  Physical Exam Vitals and nursing note reviewed.  Constitutional:      General: She is not in acute distress.    Appearance: She is well-developed.  HENT:     Head: Normocephalic and atraumatic.     Right Ear: Decreased hearing noted.     Left Ear: Decreased hearing noted.     Mouth/Throat:     Mouth: Mucous membranes are moist.     Pharynx: Oropharynx is clear.  Eyes:     Conjunctiva/sclera: Conjunctivae normal.  Cardiovascular:     Rate and Rhythm: Normal rate and regular rhythm.     Pulses:          Dorsalis pedis pulses are 2+ on the right side and 2+ on the left side.     Heart sounds: No murmur heard. Pulmonary:     Effort: Pulmonary effort is normal. No respiratory distress.     Breath sounds: Normal breath sounds.  Abdominal:     General: Bowel sounds are normal. There is no distension.     Palpations: Abdomen is soft. There is no hepatomegaly or mass.     Tenderness: There is no abdominal tenderness.  Musculoskeletal:     Right lower leg: No edema.     Left lower leg: No edema.  Lymphadenopathy:     Cervical: No cervical adenopathy.  Skin:    General: Skin is warm.     Findings: No erythema or rash.  Neurological:     General: No focal deficit present.     Mental Status: She is alert.     Cranial Nerves: No cranial nerve deficit.     Comments: She had difficulty remembering date/month/year. Oriented in person and place. Mildly unstable gait, not assisted.  Psychiatric:        Mood and Affect: Mood is not anxious or depressed.     Comments: Well groomed, good eye contact.   ASSESSMENT AND PLAN:  [name] was seen today for follow-up.  Diagnoses and all orders for this visit: Orders Placed This Encounter  Procedures   TSH   Basic metabolic panel   Lab Results  Component Value Date   TSH 1.60 12/25/2020   Lab  Results  Component Value Date   CREATININE 0.81 12/25/2020   BUN 19 12/25/2020   NA 137 12/25/2020   K 4.3 12/25/2020   CL 102 12/25/2020   CO2 25 12/25/2020   Dysphagia, unspecified type This seems to be a new problem in the past 2 to 3 months. We discussed possible etiologies. Could be associated with GERD (along with nonproductive cough). She has an appointment with GI next week.  Diarrhea, unspecified type We discussed possible etiologies. ?  IBS. Work-up has been otherwise negative so far. Some of her medications could be aggravating problem, she is going to start weaning off Cymbalta. Also recommended decreasing dose of PPI to 1/2 tablet daily.  For now recommend focusing on adequate hydration with electrolyte-rich fluids, small sips frequently throughout the day. Monitor for changes in urine output. Clearly instructed about warning signs.  Hypothyroidism, unspecified type Problem has been stable. Continue levothyroxine 25 mcg daily. Further recommendations according to TSH.  MCI (mild cognitive impairment) with memory loss We discussed possible causes. ?  Vascular dementia, Lewy body syndrome to be considered. Cognitive challenging exercises may help. She has an appointment with  neurologist.  Gastroesophageal reflux disease without esophagitis She is not having heartburn but dysphagia and nonproductive cough can be related with this problem. Because of diarrhea, recommend decreasing dose of pantoprazole from 40 mg to 20 mg. Continue GERD precautions. Keep appointment with GI.  I spent a total of 51 minutes in both face to face and non face to face activities for this visit on the date of this encounter. During this time history was obtained and documented, examination was performed, prior labs/imaging reviewed, and assessment/plan discussed.  Return if symptoms worsen or fail to improve, for Keep appt with GI and PCP.Marland Kitchen   Sara Anspach G. [place], MD  [facility]. [place] office.

## 2020-12-30 ENCOUNTER — Ambulatory Visit (INDEPENDENT_AMBULATORY_CARE_PROVIDER_SITE_OTHER): Payer: Medicare Other | Admitting: Gastroenterology

## 2020-12-30 ENCOUNTER — Other Ambulatory Visit: Payer: Medicare Other

## 2020-12-30 ENCOUNTER — Encounter: Payer: Self-pay | Admitting: Gastroenterology

## 2020-12-30 VITALS — BP 132/66 | HR 88 | Ht 64.0 in | Wt 136.0 lb

## 2020-12-30 DIAGNOSIS — K529 Noninfective gastroenteritis and colitis, unspecified: Secondary | ICD-10-CM

## 2020-12-30 HISTORY — DX: Noninfective gastroenteritis and colitis, unspecified: K52.9

## 2020-12-30 NOTE — Patient Instructions (Addendum)
If you are age 85 or older, your body mass index should be between 23-30. Your Body mass index is 23.34 kg/m. If this is out of the aforementioned range listed, please consider follow up with your Primary Care Provider. __________________________________________________________  The [place] [facility] providers would like to encourage you to use [facility] to communicate with providers for non-urgent requests or questions.  Due to long hold times on the telephone, sending your provider a message by [facility] may be a faster and more efficient way to get a response.  Please allow 48 business hours for a response.  Please remember that this is for non-urgent requests.   Your provider has requested that you go to the basement level for lab work before leaving today. Press "B" on the elevator. The lab is located at the first door on the left as you exit the elevator.  You have been scheduled to follow up with Alonza Bogus, PA-C on February 10, 2021 at 2:00 pm  Thank you for entrusting me with your care and choosing [facility].  Alonza Bogus, PA-C  Lactose-Free Diet, Adult If you have lactose intolerance, you are not able to digest lactose. Lactose is a natural sugar found mainly in dairy milk and dairy products. A lactose-freediet can help you avoid foods and beverages that contain lactose. What are tips for following this plan? Reading food labels Do not consume foods, beverages, vitamins, minerals, or medicines containing lactose. Read ingredient lists carefully. Look for the words "lactose-free" on labels. Meal planning Use alternatives to dairy milk and foods made with milk products. These include the following: Lactose-free milk. Soy milk with added calcium and vitamin D. Almond milk, coconut milk, rice milk, or other nondairy milk alternatives with added calcium and vitamin D. Note that a lot of these are low in protein. Soy products, such as soy yogurt, soy cheese, soy ice cream, and  soy-based sour cream. Other nut milk products, such as almond yogurt, almond cheese, cashew yogurt, cashew cheese, cashew ice cream, coconut yogurt, and coconut ice cream. Medicines, vitamins, and supplements Use lactase enzyme drops or tablets as directed by your health care provider. Make sure you get enough calcium and vitamin D in your diet. A lactose-free eating plan can be lacking in these important nutrients. Take calcium and vitamin D supplements as directed by your health care provider. Talk with your health care provider about supplements if you are not able to get enough calcium and vitamin D from food. What foods should I eat?  Fruits All fresh, canned, frozen, or dried fruits and fruit juices that are notprocessed with lactose. Vegetables All fresh, frozen, and canned vegetables without cheese, cream, or buttersauces. Grains Any that are not made with dairy milk or dairy products. Meats and other proteins Any meat, fish, poultry, and other protein sources that are not made with dairymilk or dairy products. Fats and oils Any that are not made with dairy milk or dairy products. Sweets and desserts Any that are not made with dairy milk or dairy products. Seasonings and condiments Any that are not made with dairy milk or dairy products. Calcium Calcium is found in many foods that contain lactose and is important for bone health. The amount of calcium you need depends on your age: Adults younger than 50 years: 1,000 mg of calcium a day. Adults older than 50 years: 1,200 mg of calcium a day. If you are not getting enough calcium, you may get it from other sources, including: [facility]  juice that has been fortified with calcium. This means that calcium has been added to the product. There are 300-350 mg of calcium in 1 cup (237 mL) of calcium-fortified orange juice. Soy milk fortified with calcium. There are 300-400 mg of calcium in 1 cup (237 mL) of calcium-fortified soy milk. Rice  or almond milk fortified with calcium. There are 300 mg of calcium in 1 cup (237 mL) of calcium-fortified rice or almond milk. Breakfast cereals fortified with calcium. There are 100-1,000 mg of calcium in calcium-fortified breakfast cereals. Spinach, cooked. There are 145 mg of calcium in  cup (90 g) of cooked spinach. Edamame, cooked. There are 130 mg of calcium in  cup (47 g) of cooked edamame. Collard greens, cooked. There are 125 mg of calcium in  cup (85 g) of cooked collard greens. Kale, frozen or cooked. There are 90 mg of calcium in  cup (59 g) of cooked or frozen kale. Almonds. There are 95 mg of calcium in  cup (35 g) of almonds. Broccoli, cooked. There are 60 mg of calcium in 1 cup (156 g) of cooked broccoli. The items listed above may not be a complete list of foods and beverages you can eat. Contact a dietitian for more options. What foods should I avoid? Lactose is found in dairy milk and dairy products, such as: Yogurt. Cheese. Butter. Margarine. Sour cream. Cream. Whipped toppings and creamers. Ice cream and other dairy-based desserts. Lactose is also found in foods or products made with dairy milk or milk ingredients. To find out whether a food contains dairy milk or a milk ingredient, look at the ingredients list. Avoid foods with the statement "May contain milk" and foods that contain: Milk powder. Whey. Curd. Lactose. Lactoglobulin. The items listed above may not be a complete list of foods and beverages to avoid. Contact a dietitian for more information. Where to find more information [employer] of Diabetes and Digestive and Kidney Diseases: [email or web address] Summary If you are lactose intolerant, it means that you are not able to digest lactose, a natural sugar found in milk and milk products. Following a lactose-free diet can help you manage this condition. Calcium is important for bone health and is found in many foods that contain lactose. Talk  with your health care provider about other sources of calcium. This information is not intended to replace advice given to you by your health care provider. Make sure you discuss any questions you have with your healthcare provider. Document Revised: 04/21/2020 Document Reviewed: 04/21/2020 Elsevier Patient Education  2022 [employer].

## 2020-12-30 NOTE — Progress Notes (Signed)
12/30/2020 ELLYSE FAILLE [id number] 12-28-1929   HISTORY OF PRESENT ILLNESS: This is a 87 year old female who was previously seen by Dr. Silverio Decamp.  She presents here today with her daughter for issues with diarrhea.  They report, and from what I can see in her chart, that she has had intermittent issues with diarrhea over the years that was thought to be IBS related.  On July 5, however, she had sudden onset of worsening diarrhea just like water.  She became dehydrated and sounds like disoriented as well.  They went to urgent care on [address] where they performed extensive lab studies and stool studies according to the daughter.  Said that they also checked a C. difficile, which was negative.  Her PCP is weaning her off of Cymbalta and they also had her cut her pantoprazole dosing in half.  They checked recent thyroid studies and she tends to have those done regularly since she is on Synthroid.  Her last colonoscopy was years ago.  Discussing with her daughter it sounds like she eats a lot of dairy and lactose-containing items.  She denies abdominal pain.  No blood in her stools.  She says that overall she feels well.  She tells me that she actually had a formed piece of stool yesterday for the first time in weeks.   Past Medical History:  Diagnosis Date   ALLERGIC RHINITIS 05/04/2010   Qualifier: Diagnosis of  By: Jimmye Norman, LPN, Winfield Cunas    Allergy    Arthritis    Asthma    Asthma, exercise induced 08/18/2010    She uses the Proventil inhaler on rare occasions not on a daily basis    CARDIAC ARRHYTHMIA 05/04/2010   Qualifier: Diagnosis of  By: Jimmye Norman, LPN, Winfield Cunas    Chest tightness or pressure 05/13/2013   Colon polyps    COLONIC POLYPS 05/04/2010   Qualifier: Diagnosis of  By: Jimmye Norman, LPN, Winfield Cunas    Cystocele 10/02/2014   Depression    DIVERTICULITIS, HX OF 05/04/2010   Qualifier: Diagnosis of  By: Jimmye Norman, LPN, Winfield Cunas    Diverticulosis    DIVERTICULOSIS, COLON  05/04/2010   Qualifier: Diagnosis of  By: Jimmye Norman LPN, Winfield Cunas    Essential hypertension 05/04/2010   Qualifier: Diagnosis of  By: Jimmye Norman, LPN, Winfield Cunas    GERD 05/04/2010   Qualifier: Diagnosis of  By: Jimmye Norman, LPN, Winfield Cunas    GERD (gastroesophageal reflux disease)    History of cardiovascular disorder 05/04/2010   Qualifier: Diagnosis of  By: Arnoldo Morale MD, John E    History of diverticulitis of colon    Hyperlipidemia    HYPERLIPIDEMIA 05/04/2010   Qualifier: Diagnosis of  By: Jimmye Norman, LPN, Winfield Cunas    Hypertension    Hypothyroidism 05/04/2010   Qualifier: Diagnosis of  By: Jimmye Norman, LPN, Winfield Cunas    Menopausal disorder 08/18/2010   When she went off the HRT she had severe mood disorder    Mitral valve prolapse    Osteoarthritis 05/04/2010   Qualifier: Diagnosis of  By: Jimmye Norman, LPN, Winfield Cunas    Palpitations 06/30/2014   Spinal stenosis    Thyroid disease    Past Surgical History:  Procedure Laterality Date   ABDOMINAL HYSTERECTOMY     cataract surgery     COLONOSCOPY     SHOULDER SURGERY Right    TONSILLECTOMY     trigger thumb Right    TUBAL LIGATION  reports that she has never smoked. She has never used smokeless tobacco. She reports current alcohol use. She reports that she does not use drugs. family history includes Allergies in her father; Arthritis in her father; Breast cancer in her maternal aunt; Cancer in her sister; Heart disease in her father; Ovarian cancer in her mother; Stroke in her father. Allergies  Allergen Reactions   Other Anaphylaxis, Shortness Of Breath, Swelling and Rash    ALL NUTS!!!!   Peanut-Containing Drug Products Anaphylaxis, Shortness Of Breath, Swelling and Rash   Pecan Nut (Diagnostic) Anaphylaxis, Shortness Of Breath and Rash   Penicillins Swelling    Full facial swelling Did it involve swelling of the face/tongue/throat, SOB, or low BP? Yes Did it involve sudden or severe rash/hives, skin peeling, or any reaction on the inside of  your mouth or nose? Yes Did you need to seek medical attention at a hospital or doctor's office? No When did it last happen? "many years ago" If all above answers are "NO", may proceed with cephalosporin use.       Outpatient Encounter Medications as of 12/30/2020  Medication Sig   amLODipine (NORVASC) 5 MG tablet TAKE ONE TABLET BY MOUTH TWICE A DAY   aspirin 81 MG tablet Take 81 mg by mouth daily.   CALCIUM PO Take 1 capsule by mouth daily. (Patient not taking: No sig reported)   Cholecalciferol (VITAMIN D-3) 25 MCG (1000 UT) CAPS Take 1,000 Units by mouth daily.   DULoxetine (CYMBALTA) 30 MG capsule Take 30 mg by mouth daily.   EPINEPHrine 0.3 mg/0.3 mL IJ SOAJ injection Inject 0.3 mg into the muscle as needed for anaphylaxis.    folic acid (FOLVITE) [id number] MCG tablet Take 400 mcg by mouth 2 (two) times daily. (Patient not taking: No sig reported)   gabapentin (NEURONTIN) 100 MG capsule Take 100 mg by mouth at bedtime.   levothyroxine (SYNTHROID) 25 MCG tablet TAKE ONE TABLET BY MOUTH DAILY BEFORE BREAKFAST   metoprolol tartrate (LOPRESSOR) 25 MG tablet TAKE TWO TABLETS BY MOUTH EVERY MORNING AND TAKE ONE TABLET BY MOUTH EVERY EVENING   OVER THE COUNTER MEDICATION Topricin foot cream as needed   pantoprazole (PROTONIX) 40 MG tablet TAKE ONE TABLET BY MOUTH DAILY   Probiotic Product (ALIGN) 4 MG CAPS Take 4 mg by mouth daily.   vitamin B-12 (CYANOCOBALAMIN) 500 MCG tablet Take 1,000 mcg by mouth 2 (two) times daily.    Facility-Administered Encounter Medications as of 12/30/2020  Medication   0.9 %  sodium chloride infusion   cloNIDine (CATAPRES) tablet 0.1 mg     REVIEW OF SYSTEMS  : All other systems reviewed and negative except where noted in the History of Present Illness.   PHYSICAL EXAM: Ht '5\' 4"'$  (1.626 m)   Wt 136 lb (61.7 kg)   BMI 23.34 kg/m  General: Well developed white female in no acute distress Head: Normocephalic and atraumatic Eyes:  Sclerae anicteric, conjunctiva  pink. Ears: Normal auditory acuity Lungs: Clear throughout to auscultation; no W/R/R. Heart: Regular rate and rhythm; no M/R/G. Abdomen: Soft, non-distended.  BS present.  Non-tender. Musculoskeletal: Symmetrical with no gross deformities  Skin: No lesions on visible extremities Extremities: No edema  Neurological: Alert oriented x 4, grossly non-focal Psychological:  Alert and cooperative. Normal mood and affect  ASSESSMENT AND PLAN: *Chronic intermittent diarrhea: Has had this on and off for years, suspected to be component of IBS.  In July had a severe worsening of her symptoms.  She  was seen at urgent care and her daughter tells me that they performed extensive labs and stool studies including C. difficile that were all negative.  TSH and other thyroid studies are monitored since she is on Synthroid.  PCP is taking her off of her Cymbalta and had her decrease the dose of her pantoprazole.  We discussed whether this could be diet related.  We will check celiac labs.  She seems to eat a lot of dairy so we discussed trying a lactose-free diet for 2 or 3 weeks to see if that makes a difference in her symptoms.  We discussed that if none of this helps then colonoscopy may be warranted to rule out other causes including microscopic colitis, etc since her last colonoscopy was many years ago.  We discussed microscopic colitis and the daughter asked it would be an option to potentially empirically try budesonide rather than jumping to a colonoscopy due to her age, etc.  We will have her follow-up here in 4 to 6 weeks and we will readdress things at that time.   CC:  Eulas Post, MD

## 2020-12-31 DIAGNOSIS — Z1159 Encounter for screening for other viral diseases: Secondary | ICD-10-CM | POA: Diagnosis not present

## 2020-12-31 DIAGNOSIS — Z20828 Contact with and (suspected) exposure to other viral communicable diseases: Secondary | ICD-10-CM | POA: Diagnosis not present

## 2020-12-31 LAB — TISSUE TRANSGLUTAMINASE, IGA: (tTG) Ab, IgA: <1 U/mL

## 2020-12-31 LAB — IGA: Immunoglobulin A: 143 mg/dL (ref 70–320)

## 2021-01-06 DIAGNOSIS — Z20828 Contact with and (suspected) exposure to other viral communicable diseases: Secondary | ICD-10-CM | POA: Diagnosis not present

## 2021-01-06 NOTE — Progress Notes (Signed)
Phone: [phone]   Subjective:  Patient presents today to establish care with me as their new primary care provider. Patient was formerly a patient of Dr. Elease Hashimoto.  Chief Complaint  Patient presents with   Transitions Of Care   See problem oriented charting    The following were reviewed and entered/updated in epic: Past Medical History:  Diagnosis Date   ALLERGIC RHINITIS 05/04/2010   Qualifier: Diagnosis of  By: Jimmye Norman, LPN, Winfield Cunas    Allergy    Arthritis    Asthma, exercise induced 08/18/2010    She uses the Proventil inhaler on rare occasions not on a daily basis    Chest tightness or pressure 05/13/2013   COLONIC POLYPS 05/04/2010   Qualifier: Diagnosis of  By: Jimmye Norman, LPN, Winfield Cunas    Cystocele 10/02/2014   Depression    mood disorder listed after off HRT   DIVERTICULITIS, HX OF 05/04/2010   Qualifier: Diagnosis of  By: Jimmye Norman LPN, Winfield Cunas    Essential hypertension 05/04/2010   Qualifier: Diagnosis of  By: Jimmye Norman, LPN, Winfield Cunas    GERD 05/04/2010   Qualifier: Diagnosis of  By: Jimmye Norman, LPN, Winfield Cunas    History of diverticulitis of colon    HYPERLIPIDEMIA 05/04/2010   Qualifier: Diagnosis of  By: Jimmye Norman, LPN, Stevie Kern M    Hypertension    Hypothyroidism 05/04/2010   Qualifier: Diagnosis of  By: Jimmye Norman, LPN, Winfield Cunas    Menopausal disorder 08/18/2010   When she went off the HRT she had severe mood disorder    Mitral valve prolapse    Osteoarthritis 05/04/2010   Qualifier: Diagnosis of  By: Jimmye Norman, LPN, Winfield Cunas    Palpitations 06/30/2014   Spinal stenosis    Thyroid disease    Patient Active Problem List   Diagnosis Date Noted   Chronic diarrhea 12/30/2020    Priority: High   Burning sensation of feet 06/21/2020    Priority: Medium   Palpitations 06/30/2014    Priority: Medium   Asthma, exercise induced 08/18/2010    Priority: Medium   Hypothyroidism 05/04/2010    Priority: Medium   Hyperlipidemia, unspecified 05/04/2010     Priority: Medium   Essential hypertension 05/04/2010    Priority: Medium   GERD 05/04/2010    Priority: Medium   Cystocele 10/02/2014    Priority: Low   COLONIC POLYPS 05/04/2010    Priority: Low   ALLERGIC RHINITIS 05/04/2010    Priority: Low   Osteoarthritis 05/04/2010    Priority: Low   DIVERTICULITIS, HX OF 05/04/2010    Priority: Low   Past Surgical History:  Procedure Laterality Date   ABDOMINAL HYSTERECTOMY     cataract surgery     COLONOSCOPY     SHOULDER SURGERY Right    TONSILLECTOMY     trigger thumb Right    TUBAL LIGATION      Family History  Problem Relation Age of Onset   Ovarian cancer Mother        in 83s   Heart disease Father        died at 50   Stroke Father    Allergies Father    Arthritis Father    Cancer Sister        brain. 66 died   Healthy Sister    Breast cancer Maternal Aunt    Other Brother        died at pneumonia young   Colon cancer Neg Hx     Medications- reviewed and  updated Current Outpatient Medications  Medication Sig Dispense Refill   albuterol (VENTOLIN HFA) 108 (90 Base) MCG/ACT inhaler Inhale 2 puffs into the lungs every 6 (six) hours as needed for wheezing or shortness of breath. 1 each 2   amLODipine (NORVASC) 5 MG tablet TAKE ONE TABLET BY MOUTH TWICE A DAY 180 tablet 0   CALCIUM PO Take 1 capsule by mouth daily.     Cholecalciferol (VITAMIN D-3) 25 MCG (1000 UT) CAPS Take 1,000 Units by mouth daily.     EPINEPHrine 0.3 mg/0.3 mL IJ SOAJ injection Inject 0.3 mg into the muscle as needed for anaphylaxis.      gabapentin (NEURONTIN) 100 MG capsule Take 100 mg by mouth at bedtime.     levothyroxine (SYNTHROID) 25 MCG tablet TAKE ONE TABLET BY MOUTH DAILY BEFORE BREAKFAST 90 tablet 0   metoprolol tartrate (LOPRESSOR) 25 MG tablet TAKE TWO TABLETS BY MOUTH EVERY MORNING AND TAKE ONE TABLET BY MOUTH EVERY EVENING 270 tablet 0   OVER THE COUNTER MEDICATION Topricin foot cream as needed     pantoprazole (PROTONIX) 40 MG  tablet TAKE ONE TABLET BY MOUTH DAILY (Patient taking differently: Take 20 mg by mouth daily.) 30 tablet 2   Probiotic Product (ALIGN) 4 MG CAPS Take 4 mg by mouth daily.     vitamin B-12 (CYANOCOBALAMIN) 500 MCG tablet Take 1,000 mcg by mouth 2 (two) times daily.      Current Facility-Administered Medications  Medication Dose Route Frequency Provider Last Rate Last Admin   cloNIDine (CATAPRES) tablet 0.1 mg  0.1 mg Oral Once Nafziger, Tommi Rumps, NP        Allergies-reviewed and updated Allergies  Allergen Reactions   Other Anaphylaxis, Shortness Of Breath, Swelling and Rash    ALL NUTS!!!!   Peanut-Containing Drug Products Anaphylaxis, Shortness Of Breath, Swelling and Rash   Pecan Nut (Diagnostic) Anaphylaxis, Shortness Of Breath and Rash   Penicillins Swelling    Full facial swelling Did it involve swelling of the face/tongue/throat, SOB, or low BP? Yes Did it involve sudden or severe rash/hives, skin peeling, or any reaction on the inside of your mouth or nose? Yes Did you need to seek medical attention at a hospital or doctor's office? No When did it last happen? "many years ago" If all above answers are "NO", may proceed with cephalosporin use.     Social History   Social History Narrative   Widowed around 2013. Lives alone with cat at [place]. Has caregiver in the morning.    3 children- 2 daughters live close. 5 grandkids. 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 Was great athlete.       Hobbies: enjoys tv, wine         Objective  Objective:  BP 132/82 (BP Location: Right Arm, Patient Position: Sitting) Comment: repeat  Pulse 70   Temp 98.2 F (36.8 C) (Temporal)   Ht '5\' 4"'$  (1.626 m)   Wt 135 lb 12.8 oz (61.6 kg)   SpO2 97%   BMI 23.31 kg/m  Gen: NAD, resting comfortably Neck: no thyromegaly CV: RRR no murmurs rubs or gallops Lungs: CTAB no crackles, wheeze, rhonchi Abdomen:  soft/nontender/nondistended/normal bowel sounds. No rebound or guarding.  Ext: no edema Skin: warm, dry    Assessment and Plan:   # chronic diarrhea S:Patient had an evaluation for chronic diarrhea with Alonza Bogus, PA-C on 12/30/2020. Patient had intermittent issues with diarrhea over  the years and believed it may be related to IBS. 12/01/2020 she started experiencing her diarrhea worsening just like water. She was dehydrated and seemed disoriented to where they travelled to urgent care on [address] to perform extensive lab studies and stool studies. Denied abdominal pain and blood in stool.  -  A C. Difficile was checked and was negative. Her PCP discontinued Cymbalta and cut her pantoprazole dose in half. They checked thyroid studies and have these done regularly since she is on Synthroid.   - She visited the ED 12/18/2020 and reported she has 2-6 episodes a day. She experienced mild shortness of breath and productive cough associated with the episodes. Included her urine is intermittently dark, but no pain. Denied fevers, sweats or abdominal pain. Seen in urgent care twice (7/5 and 7/16). CXR on were negative for active disease. A/P:  20- 30 years of GI issues. Has led to significant confusion recently. Has ended up at urgent care twice, ER 3rd time. Celiac negative, tsh normal. Was advised to try lactose free - has been off for a week with no change- advised to try 3 weeks. Also considering presumptive budesonide if wants to avoid colonoscopy. Doing a better job hydrating last few weeks but still - doing propel along with it (sucralose)- im ok with this  - also on align -overall improved- continue current medicine  # GERD #dysphagia S:Medication: pantoprazole 40 mg daily--> down to 20 mg daily but some cough with reduction and mild wheeze. B12 levels normal 2022. Reflux has not worsened A/P: with mild cough and wheeze discussed going back to 40 mg if persists over next 2 weeks  -  patient states harder to swallow as she has aged. Shed prefer to hold off on EGD unless worsens- continue close follow up with GI  # Neuropathy- burning sensation of feet from lumbar radiculopathy S: Patient was seen on 06/19/2020 by Dr. Jaynee Eagles for an evaluation of sensation in feet. Patient reported 25 years ago she noticed symptoms slowly crept up within the last 2-3 months. Episodes happened when lying in bed and she received cramps and felt her feet being on fire. Also had cramps in her toes and burning on the bottom of her feet-from toes to heels. With progression-worsened. The medication gabapentin and topricin helped a lot. Denied weakness.  medication: cymbalta 30 mg daily in past- has been taken off with diarrhea issues.  Still on gabapentin '100mg'$ - but increasing dose didn't help.   A/P: largely stable-  issues did not worsen coming off of cymbalta- will continue to monitor. Was on feet a lot as PE teacher  #hypertension S: medication: amlodipine 10 mg daily (5 mg BID), and metoprolol tartrate 25 mg twice daily BP Readings from Last 3 Encounters:  01/08/21 132/82  12/30/20 132/66  12/25/20 120/80  A/P: Stable. Continue current medications.   #hypothyroidism S: compliant On thyroid medication- levothyroxine 25 mcg daily   Lab Results  Component Value Date   TSH 1.60 12/25/2020   A/P:Stable. Continue current medications.    #history of exercise-induced asthma - medication: advair 250-50 twice daily  in past- off for sometime. No recent albuterol. Restart albuterol only and monitor frequency of use- if more than twice a week let me know  # B12 supplement possibly per neuropathy > 10 years S: Current treatment/medication (oral vs. IM): b12 twice daily [id number] mcg   Folic acid Lab Results  Component Value Date   VITAMINB12 >2000 (H) 06/19/2020   A/P: unclear  indication unless was helping neuropathy- reduce b12 to once daily, stop folic acid and she how she is doing  #reports  history of gastroparesis- no recent issues  #memory loss- patient has upcoming neurology eval. Apparently there has been some concern for TIA/cva- no recent neuroimaging- discussed this- but they wanted to defer until see Dr. Jaynee Eagles. Discussed may need statin based on (seebelow)  #hyperlipidemia S: Medication:none  Lab Results  Component Value Date   CHOL 227 (H) 08/22/2019   HDL 68 08/22/2019   LDLCALC 116 (H) 08/22/2019   LDLDIRECT 162.3 03/18/2013   TRIG 251 (H) 08/22/2019   CHOLHDL 3.3 08/22/2019   A/P: no indication for this for primary prevention- if CV disease found will need to reconsider   Recommended follow up: Return in about 6 months (around 07/11/2021) for follow-up or sooner as needed.. Future Appointments  Date Time Provider [place]  02/10/2021  2:00 PM Zehr, Tollie Eth [facility]  03/08/2021 10:30 AM Melvenia Beam, MD [facility] None    Lab/Order associations:   ICD-10-CM   1. Essential hypertension  I10     2. Hypothyroidism, unspecified type  E03.9     3. Diarrhea, unspecified type  R19.7     4. Gastroesophageal reflux disease without esophagitis  K21.9     5. Dysphagia, unspecified type  R13.10     6. Palpitations  R00.2     7. Primary osteoarthritis, unspecified site  M19.91     8. Hyperlipidemia, unspecified hyperlipidemia type  E78.5       Meds ordered this encounter  Medications   albuterol (VENTOLIN HFA) 108 (90 Base) MCG/ACT inhaler    Sig: Inhale 2 puffs into the lungs every 6 (six) hours as needed for wheezing or shortness of breath.    Dispense:  1 each    Refill:  2   I,Jada Bradford,acting as a scribe for Garret Reddish, MD.,have documented all relevant documentation on the behalf of Garret Reddish, MD,as directed by  Garret Reddish, MD while in the presence of Garret Reddish, MD.  I, Garret Reddish, MD, have reviewed all documentation for this visit. The documentation on 01/08/21 for the exam, diagnosis,  procedures, and orders are all accurate and complete.  Time Spent: 60 minutes of total time (1:42 PM- 2:42 PM) was spent on the date of the encounter performing the following actions: chart review prior to seeing the patient, obtaining history, performing a medically necessary exam, counseling on the treatment plan, placing orders, and documenting in our EHR.   Return precautions advised.  Garret Reddish, MD

## 2021-01-06 NOTE — Telephone Encounter (Signed)
Sara Medina is a bit behind on their referral they are working on getting their patients in as soon as they can and trying to get caught up on their work queue. We are provide [facility] phone number to the patients to have them to call themselves to iquire about thier referral .

## 2021-01-08 ENCOUNTER — Other Ambulatory Visit: Payer: Self-pay

## 2021-01-08 ENCOUNTER — Encounter: Payer: Self-pay | Admitting: Family Medicine

## 2021-01-08 ENCOUNTER — Ambulatory Visit (INDEPENDENT_AMBULATORY_CARE_PROVIDER_SITE_OTHER): Payer: Medicare Other | Admitting: Family Medicine

## 2021-01-08 VITALS — BP 132/82 | HR 70 | Temp 98.2°F | Ht 64.0 in | Wt 135.8 lb

## 2021-01-08 DIAGNOSIS — R002 Palpitations: Secondary | ICD-10-CM | POA: Diagnosis not present

## 2021-01-08 DIAGNOSIS — I10 Essential (primary) hypertension: Secondary | ICD-10-CM

## 2021-01-08 DIAGNOSIS — E039 Hypothyroidism, unspecified: Secondary | ICD-10-CM

## 2021-01-08 DIAGNOSIS — E785 Hyperlipidemia, unspecified: Secondary | ICD-10-CM

## 2021-01-08 DIAGNOSIS — R197 Diarrhea, unspecified: Secondary | ICD-10-CM | POA: Diagnosis not present

## 2021-01-08 DIAGNOSIS — K219 Gastro-esophageal reflux disease without esophagitis: Secondary | ICD-10-CM | POA: Diagnosis not present

## 2021-01-08 DIAGNOSIS — M1991 Primary osteoarthritis, unspecified site: Secondary | ICD-10-CM

## 2021-01-08 DIAGNOSIS — R1310 Dysphagia, unspecified: Secondary | ICD-10-CM | POA: Diagnosis not present

## 2021-01-08 MED ORDER — ALBUTEROL SULFATE HFA 108 (90 BASE) MCG/ACT IN AERS: 2.0000 | INHALATION_SPRAY | Freq: Four times a day (QID) | RESPIRATORY_TRACT | 2 refills | Status: DC | PRN

## 2021-01-08 NOTE — Patient Instructions (Addendum)
Health Maintenance Due  Topic Date Due   COVID-19 Vaccine (3 - Booster for Moderna series)   -Call back with dates.  12/12/2019   INFLUENZA VACCINE Not available in office YET.   -Please consider getting your flu shot in the Fall. If you get this outside of our office, please let us know. [phone]   Stop Folic Acid.   Reduce B12 to once a day.   Increase pantoprazole back to '40mg'$  IF cough/wheeze does not improve with as needed albuterol (rescue inhaler- log how often you are using this and let me know in 2-4 weeks)  Recommended follow up: 6 month follow up

## 2021-01-09 ENCOUNTER — Encounter: Payer: Self-pay | Admitting: Family Medicine

## 2021-01-14 DIAGNOSIS — R030 Elevated blood-pressure reading, without diagnosis of hypertension: Secondary | ICD-10-CM | POA: Diagnosis not present

## 2021-01-14 DIAGNOSIS — M4316 Spondylolisthesis, lumbar region: Secondary | ICD-10-CM | POA: Diagnosis not present

## 2021-01-14 DIAGNOSIS — M48062 Spinal stenosis, lumbar region with neurogenic claudication: Secondary | ICD-10-CM | POA: Diagnosis not present

## 2021-01-18 ENCOUNTER — Other Ambulatory Visit: Payer: Self-pay | Admitting: Neurological Surgery

## 2021-01-19 ENCOUNTER — Ambulatory Visit: Payer: Medicare Other | Admitting: Gastroenterology

## 2021-01-20 DIAGNOSIS — Z8616 Personal history of COVID-19: Secondary | ICD-10-CM | POA: Diagnosis not present

## 2021-01-20 NOTE — Progress Notes (Signed)
Surgical Instructions    Your procedure is scheduled on Wednesday 01/27/21.   Report to [facility] Main Entrance "A" at 08:45 A.M., then check in with the Admitting office.  Call this number if you have problems the morning of surgery:  [phone]   If you have any questions prior to your surgery date call [phone]: Open Monday-Friday 8am-4pm    Remember:  Do not eat or drink after midnight the night before your surgery    Take these medicines the morning of surgery with A SIP OF WATER   amLODipine (NORVASC)  levothyroxine (SYNTHROID)   metoprolol tartrate (LOPRESSOR)  pantoprazole (PROTONIX)  Take theses medicines if needed:  albuterol (VENTOLIN HFA) 108 (90 Base) azelastine (ASTELIN)  As of today, STOP taking any Aspirin (unless otherwise instructed by your surgeon) Aleve, Naproxen, Ibuprofen, Motrin, Advil, Goody's, BC's, all herbal medications, fish oil, and all vitamins.                     Do NOT Smoke (Tobacco/Vaping) or drink Alcohol 24 hours prior to your procedure.  If you use a CPAP at night, you may bring all equipment for your overnight stay.   Contacts, glasses, piercing's, hearing aid's, dentures or partials may not be worn into surgery, please bring cases for these belongings.    For patients admitted to the hospital, discharge time will be determined by your treatment team.   Patients discharged the day of surgery will not be allowed to drive home, and someone needs to stay with them for 24 hours.  ONLY 1 SUPPORT PERSON MAY BE PRESENT WHILE YOU ARE IN SURGERY. IF YOU ARE TO BE ADMITTED ONCE YOU ARE IN YOUR ROOM YOU WILL BE ALLOWED TWO (2) VISITORS.  Minor children may have two parents present. Special consideration for safety and communication needs will be reviewed on a case by case basis.   Special instructions:   [facility]- Preparing For Surgery  Before surgery, you can play an important role. Because skin is not sterile, your skin needs to be as  free of germs as possible. You can reduce the number of germs on your skin by washing with CHG (chlorahexidine gluconate) Soap before surgery.  CHG is an antiseptic cleaner which kills germs and bonds with the skin to continue killing germs even after washing.    Oral Hygiene is also important to reduce your risk of infection.  Remember - BRUSH YOUR TEETH THE MORNING OF SURGERY WITH YOUR REGULAR TOOTHPASTE  Please do not use if you have an allergy to CHG or antibacterial soaps. If your skin becomes reddened/irritated stop using the CHG.  Do not shave (including legs and underarms) for at least 48 hours prior to first CHG shower. It is OK to shave your face.  Please follow these instructions carefully.   Shower the NIGHT BEFORE SURGERY and the MORNING OF SURGERY  If you chose to wash your hair, wash your hair first as usual with your normal shampoo.  After you shampoo, rinse your hair and body thoroughly to remove the shampoo.  Use CHG Soap as you would any other liquid soap. You can apply CHG directly to the skin and wash gently with a scrungie or a clean washcloth.   Apply the CHG Soap to your body ONLY FROM THE NECK DOWN.  Do not use on open wounds or open sores. Avoid contact with your eyes, ears, mouth and genitals (private parts). Wash Face and genitals (private parts)  with  your normal soap.   Wash thoroughly, paying special attention to the area where your surgery will be performed.  Thoroughly rinse your body with warm water from the neck down.  DO NOT shower/wash with your normal soap after using and rinsing off the CHG Soap.  Pat yourself dry with a CLEAN TOWEL.  Wear CLEAN PAJAMAS to bed the night before surgery  Place CLEAN SHEETS on your bed the night before your surgery  DO NOT SLEEP WITH PETS.   Day of Surgery: Shower with CHG soap. Do not wear jewelry, make up, nail polish, gel polish, artificial nails, or any other type of covering on natural nails including  finger and toenails. If patients have artificial nails, gel coating, etc. that need to be removed by a nail salon please have this removed prior to surgery. Surgery may need to be canceled/delayed if the surgeon/ anesthesia feels like the patient is unable to be adequately monitored. Do not wear lotions, powders, perfumes/colognes, or deodorant. Do not shave 48 hours prior to surgery.  Men may shave face and neck. Do not bring valuables to the hospital. [facility] is not responsible for any belongings or valuables. Wear Clean/Comfortable clothing the morning of surgery Remember to brush your teeth WITH YOUR REGULAR TOOTHPASTE.   Please read over the following fact sheets that you were given.

## 2021-01-21 ENCOUNTER — Encounter (HOSPITAL_COMMUNITY): Payer: Self-pay

## 2021-01-21 ENCOUNTER — Other Ambulatory Visit: Payer: Self-pay

## 2021-01-21 ENCOUNTER — Encounter (HOSPITAL_COMMUNITY)
Admission: RE | Admit: 2021-01-21 | Discharge: 2021-01-21 | Disposition: A | Discharge status: Home / Self Care | Payer: Medicare Other | Source: Ambulatory Visit | Attending: Neurological Surgery | Admitting: Neurological Surgery

## 2021-01-21 DIAGNOSIS — Z01812 Encounter for preprocedural laboratory examination: Secondary | ICD-10-CM | POA: Insufficient documentation

## 2021-01-21 HISTORY — DX: Polyneuropathy, unspecified: G62.9

## 2021-01-21 HISTORY — DX: Pneumonia, unspecified organism: J18.9

## 2021-01-21 LAB — PROTIME-INR
INR: 0.9 (ref 0.8–1.2)
Prothrombin Time: 12.4 seconds (ref 11.4–15.2)

## 2021-01-21 LAB — CBC WITH DIFFERENTIAL/PLATELET
Abs Immature Granulocytes: 0.03 10*3/uL (ref 0.00–0.07)
Basophils Absolute: 0 10*3/uL (ref 0.0–0.1)
Basophils Relative: 1 %
Eosinophils Absolute: 0.1 10*3/uL (ref 0.0–0.5)
Eosinophils Relative: 2 %
HCT: 42.5 % (ref 36.0–46.0)
Hemoglobin: 14.4 g/dL (ref 12.0–15.0)
Immature Granulocytes: 0 %
Lymphocytes Relative: 22 %
Lymphs Abs: 1.9 10*3/uL (ref 0.7–4.0)
MCH: 31.9 pg (ref 26.0–34.0)
MCHC: 33.9 g/dL (ref 30.0–36.0)
MCV: 94.2 fL (ref 80.0–100.0)
Monocytes Absolute: 0.6 10*3/uL (ref 0.1–1.0)
Monocytes Relative: 7 %
Neutro Abs: 5.8 10*3/uL (ref 1.7–7.7)
Neutrophils Relative %: 68 %
Platelets: 325 10*3/uL (ref 150–400)
RBC: 4.51 MIL/uL (ref 3.87–5.11)
RDW: 12.8 % (ref 11.5–15.5)
WBC: 8.5 10*3/uL (ref 4.0–10.5)
nRBC: 0 % (ref 0.0–0.2)

## 2021-01-21 LAB — COMPREHENSIVE METABOLIC PANEL
ALT: 19 U/L (ref 0–44)
AST: 21 U/L (ref 15–41)
Albumin: 4 g/dL (ref 3.5–5.0)
Alkaline Phosphatase: 59 U/L (ref 38–126)
Anion gap: 11 (ref 5–15)
BUN: 18 mg/dL (ref 8–23)
CO2: 23 mmol/L (ref 22–32)
Calcium: 9.8 mg/dL (ref 8.9–10.3)
Chloride: 103 mmol/L (ref 98–111)
Creatinine, Ser: 0.82 mg/dL (ref 0.44–1.00)
GFR, Estimated: >60 mL/min (ref >60)
Glucose, Bld: 117 mg/dL — ABNORMAL HIGH (ref 70–99)
Potassium: 3.7 mmol/L (ref 3.5–5.1)
Sodium: 137 mmol/L (ref 135–145)
Total Bilirubin: 0.6 mg/dL (ref 0.3–1.2)
Total Protein: 6.8 g/dL (ref 6.5–8.1)

## 2021-01-21 LAB — SURGICAL PCR SCREEN
MRSA, PCR: NEGATIVE
Staphylococcus aureus: NEGATIVE

## 2021-01-21 NOTE — Progress Notes (Signed)
PCP - Brayton Mars. Yong Channel, MD Cardiologist - Denies  PPM/ICD - Denies  Chest x-ray - 12/18/20 EKG - 12/18/20 Stress Test - Denies ECHO - 09/04/19 Cardiac Cath - Denies  Sleep Study - Denies  DM- Denies  Blood Thinner Instructions: N/A Aspirin Instructions:N/A  ERAS Protcol - N/A PRE-SURGERY Ensure or G2- N/A  COVID TEST- Pt instructed to go to [facility] 01/25/21; Pt given address and map as well as lab requisition form   Anesthesia review: No  Patient denies shortness of breath, fever, cough and chest pain at PAT appointment   All instructions explained to the patient, with a verbal understanding of the material. Patient agrees to go over the instructions while at home for a better understanding. The opportunity to ask questions was provided.

## 2021-01-22 NOTE — Progress Notes (Signed)
Received call from Blood Bank stating sample was positive for antibodies. T& S to be re-collected DOS

## 2021-01-25 ENCOUNTER — Other Ambulatory Visit: Payer: Self-pay | Admitting: Neurological Surgery

## 2021-01-25 LAB — SARS CORONAVIRUS 2 (TAT 6-24 HRS): SARS Coronavirus 2: NEGATIVE

## 2021-01-25 NOTE — Progress Notes (Signed)
Reviewed and agree with documentation and assessment and plan. K. Veena [place] , MD   

## 2021-01-27 ENCOUNTER — Encounter (HOSPITAL_COMMUNITY): Payer: Self-pay | Admitting: Neurological Surgery

## 2021-01-27 ENCOUNTER — Inpatient Hospital Stay (HOSPITAL_COMMUNITY): Payer: Medicare Other | Admitting: Certified Registered"

## 2021-01-27 ENCOUNTER — Inpatient Hospital Stay (HOSPITAL_COMMUNITY): Payer: Medicare Other

## 2021-01-27 ENCOUNTER — Other Ambulatory Visit: Payer: Self-pay

## 2021-01-27 ENCOUNTER — Encounter (HOSPITAL_COMMUNITY)
Admission: RE | Disposition: A | Discharge status: Home Health | Payer: Self-pay | Source: Home / Self Care | Attending: Neurological Surgery

## 2021-01-27 ENCOUNTER — Inpatient Hospital Stay (HOSPITAL_COMMUNITY)
Admission: RE | Admit: 2021-01-27 | Discharge: 2021-01-28 | DRG: 460 | Disposition: A | Discharge status: Home Health | Payer: Medicare Other | Attending: Neurological Surgery | Admitting: Neurological Surgery

## 2021-01-27 DIAGNOSIS — Z79890 Hormone replacement therapy (postmenopausal): Secondary | ICD-10-CM | POA: Diagnosis not present

## 2021-01-27 DIAGNOSIS — Z79899 Other long term (current) drug therapy: Secondary | ICD-10-CM | POA: Diagnosis not present

## 2021-01-27 DIAGNOSIS — J45990 Exercise induced bronchospasm: Secondary | ICD-10-CM | POA: Diagnosis present

## 2021-01-27 DIAGNOSIS — M4326 Fusion of spine, lumbar region: Secondary | ICD-10-CM | POA: Diagnosis not present

## 2021-01-27 DIAGNOSIS — E039 Hypothyroidism, unspecified: Secondary | ICD-10-CM | POA: Diagnosis present

## 2021-01-27 DIAGNOSIS — E785 Hyperlipidemia, unspecified: Secondary | ICD-10-CM | POA: Diagnosis present

## 2021-01-27 DIAGNOSIS — Z419 Encounter for procedure for purposes other than remedying health state, unspecified: Secondary | ICD-10-CM

## 2021-01-27 DIAGNOSIS — Z91010 Allergy to peanuts: Secondary | ICD-10-CM | POA: Diagnosis not present

## 2021-01-27 DIAGNOSIS — Z880 Allergy status to penicillin: Secondary | ICD-10-CM | POA: Diagnosis not present

## 2021-01-27 DIAGNOSIS — Z8249 Family history of ischemic heart disease and other diseases of the circulatory system: Secondary | ICD-10-CM

## 2021-01-27 DIAGNOSIS — I10 Essential (primary) hypertension: Secondary | ICD-10-CM | POA: Diagnosis present

## 2021-01-27 DIAGNOSIS — Z981 Arthrodesis status: Secondary | ICD-10-CM

## 2021-01-27 DIAGNOSIS — M4316 Spondylolisthesis, lumbar region: Secondary | ICD-10-CM | POA: Diagnosis present

## 2021-01-27 DIAGNOSIS — Z91018 Allergy to other foods: Secondary | ICD-10-CM | POA: Diagnosis not present

## 2021-01-27 DIAGNOSIS — M48061 Spinal stenosis, lumbar region without neurogenic claudication: Principal | ICD-10-CM | POA: Diagnosis present

## 2021-01-27 DIAGNOSIS — K219 Gastro-esophageal reflux disease without esophagitis: Secondary | ICD-10-CM | POA: Diagnosis present

## 2021-01-27 HISTORY — DX: Arthrodesis status: Z98.1

## 2021-01-27 HISTORY — PX: LAMINECTOMY WITH POSTERIOR LATERAL ARTHRODESIS LEVEL 1: SHX6335

## 2021-01-27 LAB — TYPE AND SCREEN
ABO/RH(D): A POS
Antibody Screen: POSITIVE

## 2021-01-27 SURGERY — LAMINECTOMY WITH POSTERIOR LATERAL ARTHRODESIS LEVEL 1
Anesthesia: General | Site: Back

## 2021-01-27 MED ORDER — OXYCODONE HCL 5 MG PO TABS
ORAL_TABLET | ORAL | Status: AC
  Filled 2021-01-27: qty 1

## 2021-01-27 MED ORDER — ORAL CARE MOUTH RINSE: 15.0000 mL | Freq: Once | OROMUCOSAL | Status: AC

## 2021-01-27 MED ORDER — LEVOTHYROXINE SODIUM 25 MCG PO TABS
25.0000 ug | ORAL_TABLET | Freq: Every day | ORAL | Status: DC
  Administered 2021-01-28: 25 ug via ORAL
  Filled 2021-01-27: qty 1

## 2021-01-27 MED ORDER — METHOCARBAMOL 500 MG PO TABS
500.0000 mg | ORAL_TABLET | Freq: Four times a day (QID) | ORAL | Status: DC | PRN
  Administered 2021-01-27 (×2): 500 mg via ORAL
  Filled 2021-01-27: qty 1

## 2021-01-27 MED ORDER — CHLORHEXIDINE GLUCONATE CLOTH 2 % EX PADS: 6.0000 | MEDICATED_PAD | Freq: Once | CUTANEOUS | Status: DC

## 2021-01-27 MED ORDER — DEXAMETHASONE SODIUM PHOSPHATE 10 MG/ML IJ SOLN
10.0000 mg | Freq: Once | INTRAMUSCULAR | Status: AC
  Administered 2021-01-27: 10 mg via INTRAVENOUS

## 2021-01-27 MED ORDER — 0.9 % SODIUM CHLORIDE (POUR BTL) OPTIME
TOPICAL | Status: DC | PRN
  Administered 2021-01-27 (×2): 1000 mL

## 2021-01-27 MED ORDER — SODIUM CHLORIDE 0.9% FLUSH: 3.0000 mL | INTRAVENOUS | Status: DC | PRN

## 2021-01-27 MED ORDER — GABAPENTIN 100 MG PO CAPS
200.0000 mg | ORAL_CAPSULE | Freq: Every day | ORAL | Status: DC
  Administered 2021-01-27: 200 mg via ORAL
  Filled 2021-01-27: qty 2

## 2021-01-27 MED ORDER — ACETAMINOPHEN 650 MG RE SUPP: 650.0000 mg | RECTAL | Status: DC | PRN

## 2021-01-27 MED ORDER — ONDANSETRON HCL 4 MG PO TABS: 4.0000 mg | ORAL_TABLET | Freq: Four times a day (QID) | ORAL | Status: DC | PRN

## 2021-01-27 MED ORDER — POTASSIUM CHLORIDE IN NACL 20-0.9 MEQ/L-% IV SOLN: INTRAVENOUS | Status: DC

## 2021-01-27 MED ORDER — SODIUM CHLORIDE 0.9% FLUSH
3.0000 mL | Freq: Two times a day (BID) | INTRAVENOUS | Status: DC
  Administered 2021-01-27: 3 mL via INTRAVENOUS

## 2021-01-27 MED ORDER — ROCURONIUM BROMIDE 10 MG/ML (PF) SYRINGE
PREFILLED_SYRINGE | INTRAVENOUS | Status: DC | PRN
  Administered 2021-01-27: 10 mg via INTRAVENOUS
  Administered 2021-01-27: 50 mg via INTRAVENOUS

## 2021-01-27 MED ORDER — ONDANSETRON HCL 4 MG/2ML IJ SOLN: 4.0000 mg | Freq: Once | INTRAMUSCULAR | Status: DC | PRN

## 2021-01-27 MED ORDER — AMLODIPINE BESYLATE 5 MG PO TABS
5.0000 mg | ORAL_TABLET | Freq: Every day | ORAL | Status: DC
  Administered 2021-01-28: 5 mg via ORAL
  Filled 2021-01-27: qty 1

## 2021-01-27 MED ORDER — PROPOFOL 10 MG/ML IV BOLUS
INTRAVENOUS | Status: AC
  Filled 2021-01-27: qty 20

## 2021-01-27 MED ORDER — VANCOMYCIN HCL 750 MG/150ML IV SOLN
750.0000 mg | Freq: Once | INTRAVENOUS | Status: AC
  Administered 2021-01-28: 750 mg via INTRAVENOUS
  Filled 2021-01-27: qty 150

## 2021-01-27 MED ORDER — ONDANSETRON HCL 4 MG/2ML IJ SOLN: 4.0000 mg | Freq: Four times a day (QID) | INTRAMUSCULAR | Status: DC | PRN

## 2021-01-27 MED ORDER — ACETAMINOPHEN 325 MG PO TABS
650.0000 mg | ORAL_TABLET | ORAL | Status: DC | PRN
  Administered 2021-01-28 (×2): 650 mg via ORAL
  Filled 2021-01-27 (×2): qty 2

## 2021-01-27 MED ORDER — EPHEDRINE 5 MG/ML INJ
INTRAVENOUS | Status: AC
  Filled 2021-01-27: qty 5

## 2021-01-27 MED ORDER — FENTANYL CITRATE (PF) 250 MCG/5ML IJ SOLN
INTRAMUSCULAR | Status: DC | PRN
  Administered 2021-01-27: 100 ug via INTRAVENOUS
  Administered 2021-01-27 (×3): 50 ug via INTRAVENOUS

## 2021-01-27 MED ORDER — DEXAMETHASONE SODIUM PHOSPHATE 10 MG/ML IJ SOLN
INTRAMUSCULAR | Status: AC
  Filled 2021-01-27: qty 1

## 2021-01-27 MED ORDER — LIDOCAINE 2% (20 MG/ML) 5 ML SYRINGE
INTRAMUSCULAR | Status: DC | PRN
  Administered 2021-01-27: 80 mg via INTRAVENOUS

## 2021-01-27 MED ORDER — THROMBIN 20000 UNITS EX SOLR
CUTANEOUS | Status: AC
  Filled 2021-01-27: qty 20000

## 2021-01-27 MED ORDER — METHOCARBAMOL 1000 MG/10ML IJ SOLN
500.0000 mg | Freq: Four times a day (QID) | INTRAVENOUS | Status: DC | PRN
  Filled 2021-01-27: qty 5

## 2021-01-27 MED ORDER — HYDROCODONE-ACETAMINOPHEN 7.5-325 MG PO TABS
1.0000 | ORAL_TABLET | ORAL | Status: DC | PRN
  Administered 2021-01-27: 1 via ORAL
  Filled 2021-01-27: qty 1

## 2021-01-27 MED ORDER — CHLORHEXIDINE GLUCONATE 0.12 % MT SOLN
15.0000 mL | Freq: Once | OROMUCOSAL | Status: AC
  Administered 2021-01-27: 15 mL via OROMUCOSAL
  Filled 2021-01-27: qty 15

## 2021-01-27 MED ORDER — METOPROLOL TARTRATE 25 MG PO TABS
25.0000 mg | ORAL_TABLET | Freq: Every day | ORAL | Status: DC
  Administered 2021-01-27: 25 mg via ORAL
  Filled 2021-01-27: qty 1

## 2021-01-27 MED ORDER — METOPROLOL TARTRATE 25 MG PO TABS
50.0000 mg | ORAL_TABLET | Freq: Every day | ORAL | Status: DC
  Administered 2021-01-28: 50 mg via ORAL
  Filled 2021-01-27: qty 2

## 2021-01-27 MED ORDER — MENTHOL 3 MG MT LOZG: 1.0000 | LOZENGE | OROMUCOSAL | Status: DC | PRN

## 2021-01-27 MED ORDER — THROMBIN 5000 UNITS EX SOLR
CUTANEOUS | Status: AC
  Filled 2021-01-27: qty 5000

## 2021-01-27 MED ORDER — CELECOXIB 200 MG PO CAPS
200.0000 mg | ORAL_CAPSULE | Freq: Two times a day (BID) | ORAL | Status: DC
  Administered 2021-01-27 – 2021-01-28 (×2): 200 mg via ORAL
  Filled 2021-01-27 (×2): qty 1

## 2021-01-27 MED ORDER — GABAPENTIN 300 MG PO CAPS
300.0000 mg | ORAL_CAPSULE | ORAL | Status: DC
  Filled 2021-01-27: qty 1

## 2021-01-27 MED ORDER — THROMBIN 20000 UNITS EX SOLR
CUTANEOUS | Status: DC | PRN
  Administered 2021-01-27: 20 mL via TOPICAL

## 2021-01-27 MED ORDER — ONDANSETRON HCL 4 MG/2ML IJ SOLN
INTRAMUSCULAR | Status: AC
  Filled 2021-01-27: qty 2

## 2021-01-27 MED ORDER — VANCOMYCIN HCL IN DEXTROSE 1-5 GM/200ML-% IV SOLN
1000.0000 mg | INTRAVENOUS | Status: AC
  Administered 2021-01-27: 1000 mg via INTRAVENOUS
  Filled 2021-01-27: qty 200

## 2021-01-27 MED ORDER — ONDANSETRON HCL 4 MG/2ML IJ SOLN
INTRAMUSCULAR | Status: DC | PRN
  Administered 2021-01-27: 4 mg via INTRAVENOUS

## 2021-01-27 MED ORDER — EPHEDRINE SULFATE 50 MG/ML IJ SOLN
INTRAMUSCULAR | Status: DC | PRN
  Administered 2021-01-27 (×5): 5 mg via INTRAVENOUS

## 2021-01-27 MED ORDER — SODIUM CHLORIDE 0.9 % IV SOLN
250.0000 mL | INTRAVENOUS | Status: DC
  Administered 2021-01-27: 250 mL via INTRAVENOUS

## 2021-01-27 MED ORDER — METHOCARBAMOL 500 MG PO TABS
ORAL_TABLET | ORAL | Status: AC
  Filled 2021-01-27: qty 1

## 2021-01-27 MED ORDER — ACETAMINOPHEN 500 MG PO TABS
1000.0000 mg | ORAL_TABLET | ORAL | Status: AC
  Administered 2021-01-27: 1000 mg via ORAL
  Filled 2021-01-27: qty 2

## 2021-01-27 MED ORDER — THROMBIN 5000 UNITS EX SOLR
OROMUCOSAL | Status: DC | PRN
  Administered 2021-01-27: 5 mL via TOPICAL

## 2021-01-27 MED ORDER — BUPIVACAINE HCL (PF) 0.25 % IJ SOLN
INTRAMUSCULAR | Status: AC
  Filled 2021-01-27: qty 30

## 2021-01-27 MED ORDER — SODIUM CHLORIDE 0.9 % IV SOLN
INTRAVENOUS | Status: DC | PRN
  Administered 2021-01-27: 30 ug/min via INTRAVENOUS

## 2021-01-27 MED ORDER — FENTANYL CITRATE (PF) 250 MCG/5ML IJ SOLN
INTRAMUSCULAR | Status: AC
  Filled 2021-01-27: qty 5

## 2021-01-27 MED ORDER — PANTOPRAZOLE SODIUM 20 MG PO TBEC
20.0000 mg | DELAYED_RELEASE_TABLET | Freq: Every day | ORAL | Status: DC
  Administered 2021-01-28: 20 mg via ORAL
  Filled 2021-01-27: qty 1

## 2021-01-27 MED ORDER — LACTATED RINGERS IV SOLN: INTRAVENOUS | Status: DC

## 2021-01-27 MED ORDER — BUPIVACAINE HCL (PF) 0.25 % IJ SOLN
INTRAMUSCULAR | Status: DC | PRN
  Administered 2021-01-27: 7 mL

## 2021-01-27 MED ORDER — OXYCODONE HCL 5 MG PO TABS
5.0000 mg | ORAL_TABLET | Freq: Once | ORAL | Status: AC | PRN
  Administered 2021-01-27: 5 mg via ORAL

## 2021-01-27 MED ORDER — SENNA 8.6 MG PO TABS
1.0000 | ORAL_TABLET | Freq: Two times a day (BID) | ORAL | Status: DC
  Administered 2021-01-27 – 2021-01-28 (×2): 8.6 mg via ORAL
  Filled 2021-01-27 (×2): qty 1

## 2021-01-27 MED ORDER — FENTANYL CITRATE (PF) 100 MCG/2ML IJ SOLN: 25.0000 ug | INTRAMUSCULAR | Status: DC | PRN

## 2021-01-27 MED ORDER — PHENOL 1.4 % MT LIQD: 1.0000 | OROMUCOSAL | Status: DC | PRN

## 2021-01-27 MED ORDER — ALBUTEROL SULFATE (2.5 MG/3ML) 0.083% IN NEBU: 2.5000 mg | INHALATION_SOLUTION | Freq: Four times a day (QID) | RESPIRATORY_TRACT | Status: DC | PRN

## 2021-01-27 MED ORDER — MORPHINE SULFATE (PF) 2 MG/ML IV SOLN: 2.0000 mg | INTRAVENOUS | Status: DC | PRN

## 2021-01-27 MED ORDER — LIDOCAINE 2% (20 MG/ML) 5 ML SYRINGE
INTRAMUSCULAR | Status: AC
  Filled 2021-01-27: qty 5

## 2021-01-27 MED ORDER — OXYCODONE HCL 5 MG/5ML PO SOLN: 5.0000 mg | Freq: Once | ORAL | Status: AC | PRN

## 2021-01-27 MED ORDER — SUGAMMADEX SODIUM 200 MG/2ML IV SOLN
INTRAVENOUS | Status: DC | PRN
  Administered 2021-01-27: 128.8 mg via INTRAVENOUS

## 2021-01-27 MED ORDER — ALBUTEROL SULFATE HFA 108 (90 BASE) MCG/ACT IN AERS: 2.0000 | INHALATION_SPRAY | Freq: Four times a day (QID) | RESPIRATORY_TRACT | Status: DC | PRN

## 2021-01-27 MED ORDER — PROPOFOL 10 MG/ML IV BOLUS
INTRAVENOUS | Status: DC | PRN
  Administered 2021-01-27: 110 mg via INTRAVENOUS

## 2021-01-27 SURGICAL SUPPLY — 53 items
BAG COUNTER SPONGE SURGICOUNT (BAG) ×2 IMPLANT
BASKET BONE COLLECTION (BASKET) ×2 IMPLANT
BENZOIN TINCTURE PRP APPL 2/3 (GAUZE/BANDAGES/DRESSINGS) ×2 IMPLANT
BLADE CLIPPER SURG (BLADE) IMPLANT
BUR CARBIDE MATCH 3.0 (BURR) ×2 IMPLANT
CANISTER SUCT 3000ML PPV (MISCELLANEOUS) ×2 IMPLANT
CNTNR URN SCR LID CUP LEK RST (MISCELLANEOUS) ×1 IMPLANT
CONT SPEC 4OZ STRL OR WHT (MISCELLANEOUS) ×1
COVER BACK TABLE 60X90IN (DRAPES) IMPLANT
DERMABOND ADVANCED (GAUZE/BANDAGES/DRESSINGS) ×1
DERMABOND ADVANCED .7 DNX12 (GAUZE/BANDAGES/DRESSINGS) ×1 IMPLANT
DRAPE C-ARM 42X72 X-RAY (DRAPES) ×2 IMPLANT
DRAPE C-ARMOR (DRAPES) ×2 IMPLANT
DRAPE LAPAROTOMY 100X72X124 (DRAPES) ×2 IMPLANT
DRAPE SURG 17X23 STRL (DRAPES) IMPLANT
DRSG OPSITE POSTOP 4X6 (GAUZE/BANDAGES/DRESSINGS) ×2 IMPLANT
DURAPREP 26ML APPLICATOR (WOUND CARE) ×2 IMPLANT
ELECT REM PT RETURN 9FT ADLT (ELECTROSURGICAL) ×2
ELECTRODE REM PT RTRN 9FT ADLT (ELECTROSURGICAL) ×1 IMPLANT
EVACUATOR 1/8 PVC DRAIN (DRAIN) IMPLANT
GAUZE 4X4 16PLY ~~LOC~~+RFID DBL (SPONGE) ×2 IMPLANT
GLOVE SURG ENC MOIS LTX SZ7 (GLOVE) ×4 IMPLANT
GLOVE SURG ENC MOIS LTX SZ8 (GLOVE) ×4 IMPLANT
GLOVE SURG LTX SZ7.5 (GLOVE) ×4 IMPLANT
GLOVE SURG POLYISO LF SZ7 (GLOVE) ×6 IMPLANT
GLOVE SURG UNDER POLY LF SZ7 (GLOVE) ×2 IMPLANT
GLOVE SURG UNDER POLY LF SZ7.5 (GLOVE) ×2 IMPLANT
GOWN STRL REUS W/ TWL LRG LVL3 (GOWN DISPOSABLE) ×2 IMPLANT
GOWN STRL REUS W/ TWL XL LVL3 (GOWN DISPOSABLE) ×2 IMPLANT
GOWN STRL REUS W/TWL 2XL LVL3 (GOWN DISPOSABLE) IMPLANT
GOWN STRL REUS W/TWL LRG LVL3 (GOWN DISPOSABLE) ×2
GOWN STRL REUS W/TWL XL LVL3 (GOWN DISPOSABLE) ×2
GRAFT TRINITY ELITE LGE HUMAN (Tissue) ×2 IMPLANT
HEMOSTAT POWDER KIT SURGIFOAM (HEMOSTASIS) ×2 IMPLANT
IMPL INTERSPINOUS FX 40-45 (Cage) ×1 IMPLANT
IMPLANT INTERSPINOUS FX 40-45 (Cage) ×2 IMPLANT
KIT BASIN OR (CUSTOM PROCEDURE TRAY) ×2 IMPLANT
KIT TURNOVER KIT B (KITS) ×2 IMPLANT
NEEDLE HYPO 25X1 1.5 SAFETY (NEEDLE) ×2 IMPLANT
NS IRRIG 1000ML POUR BTL (IV SOLUTION) ×4 IMPLANT
PACK LAMINECTOMY NEURO (CUSTOM PROCEDURE TRAY) ×2 IMPLANT
PAD ARMBOARD 7.5X6 YLW CONV (MISCELLANEOUS) ×6 IMPLANT
SPONGE SURGIFOAM ABS GEL 100 (HEMOSTASIS) ×2 IMPLANT
SPONGE T-LAP 4X18 ~~LOC~~+RFID (SPONGE) ×4 IMPLANT
STRIP CLOSURE SKIN 1/2X4 (GAUZE/BANDAGES/DRESSINGS) ×2 IMPLANT
SUT VIC AB 0 CT1 18XCR BRD8 (SUTURE) ×1 IMPLANT
SUT VIC AB 0 CT1 8-18 (SUTURE) ×1
SUT VIC AB 2-0 CP2 18 (SUTURE) ×2 IMPLANT
SUT VIC AB 3-0 SH 8-18 (SUTURE) ×4 IMPLANT
TOWEL GREEN STERILE (TOWEL DISPOSABLE) ×2 IMPLANT
TOWEL GREEN STERILE FF (TOWEL DISPOSABLE) ×2 IMPLANT
TRAY FOLEY MTR SLVR 16FR STAT (SET/KITS/TRAYS/PACK) IMPLANT
WATER STERILE IRR 1000ML POUR (IV SOLUTION) ×2 IMPLANT

## 2021-01-27 NOTE — Anesthesia Preprocedure Evaluation (Signed)
Anesthesia Evaluation  Patient identified by MRN, date of birth, ID band Patient awake    Reviewed: Allergy & Precautions, NPO status , Patient's Chart, lab work & pertinent test results  Airway Mallampati: II  TM Distance: >3 FB Neck ROM: Full    Dental  (+) Teeth Intact   Pulmonary asthma ,    Pulmonary exam normal        Cardiovascular hypertension, Pt. on medications and Pt. on home beta blockers  Rhythm:Regular Rate:Normal     Neuro/Psych Depression negative neurological ROS     GI/Hepatic Neg liver ROS, GERD  Medicated,  Endo/Other  Hypothyroidism   Renal/GU negative Renal ROS  negative genitourinary   Musculoskeletal  (+) Arthritis , Osteoarthritis,  Spondylolisthesis    Abdominal (+)  Abdomen: soft. Bowel sounds: normal.  Peds  Hematology negative hematology ROS (+)   Anesthesia Other Findings   Reproductive/Obstetrics                             Anesthesia Physical Anesthesia Plan  ASA: 2  Anesthesia Plan: General   Post-op Pain Management:    Induction: Intravenous  PONV Risk Score and Plan: 3 and Ondansetron, Dexamethasone and Treatment may vary due to age or medical condition  Airway Management Planned: Mask and Oral ETT  Additional Equipment: None  Intra-op Plan:   Post-operative Plan: Extubation in OR  Informed Consent: I have reviewed the patients History and Physical, chart, labs and discussed the procedure including the risks, benefits and alternatives for the proposed anesthesia with the patient or authorized representative who has indicated his/her understanding and acceptance.     Dental advisory given  Plan Discussed with: CRNA  Anesthesia Plan Comments: (Lab Results      Component                Value               Date                      WBC                      8.5                 01/21/2021                HGB                      14.4                 01/21/2021                HCT                      42.5                01/21/2021                MCV                      94.2                01/21/2021                PLT  325                 01/21/2021           Lab Results      Component                Value               Date                      NA                       137                 01/21/2021                K                        3.7                 01/21/2021                CO2                      23                  01/21/2021                GLUCOSE                  117 (H)             01/21/2021                BUN                      18                  01/21/2021                CREATININE               0.82                01/21/2021                CALCIUM                  9.8                 01/21/2021                GFRNONAA                 >60                 01/21/2021                GFRAA                    62                  06/30/2020          )        Anesthesia Quick Evaluation

## 2021-01-27 NOTE — H&P (Signed)
Subjective: Patient is a 87 y.o. female admitted for back and leg pain. Onset of symptoms was several months ago, gradually worsening since that time.  The pain is rated severe, and is located at the across the lower back and radiates to legs. The pain is described as aching and occurs all day. The symptoms have been progressive. Symptoms are exacerbated by exercise, extension, and standing. MRI or CT showed spinal stenosis L3-4 L4-5 with a grade 1 spondylolisthesis L4-5  Past Medical History:  Diagnosis Date   ALLERGIC RHINITIS 05/04/2010   Qualifier: Diagnosis of  By: Jimmye Norman, LPN, [place] Cunas    Allergy    Arthritis    Asthma, exercise induced 08/18/2010    She uses the Proventil inhaler on rare occasions not on a daily basis    Chest tightness or pressure 05/13/2013   COLONIC POLYPS 05/04/2010   Qualifier: Diagnosis of  By: Jimmye Norman, LPN, [place] Cunas    Cystocele 10/02/2014   Depression    mood disorder listed after off HRT   DIVERTICULITIS, HX OF 05/04/2010   Qualifier: Diagnosis of  By: Jimmye Norman LPN, [place] Cunas    Essential hypertension 05/04/2010   Qualifier: Diagnosis of  By: Jimmye Norman, LPN, [place] Cunas    GERD 05/04/2010   Qualifier: Diagnosis of  By: Jimmye Norman LPN, [place] Cunas    History of diverticulitis of colon    HYPERLIPIDEMIA 05/04/2010   Qualifier: Diagnosis of  By: Jimmye Norman, LPN, [place] Cunas    Hypertension    Hypothyroidism 05/04/2010   Qualifier: Diagnosis of  By: Jimmye Norman, LPN, [place] Cunas    Menopausal disorder 08/18/2010   When she went off the HRT she had severe mood disorder    Mitral valve prolapse    Neuropathy    Osteoarthritis 05/04/2010   Qualifier: Diagnosis of  By: Jimmye Norman, LPN, [place] Cunas    Palpitations 06/30/2014   Pneumonia    Spinal stenosis    Thyroid disease     Past Surgical History:  Procedure Laterality Date   ABDOMINAL HYSTERECTOMY     CATARACT EXTRACTION Bilateral    cataract surgery     COLONOSCOPY     EYE SURGERY     SHOULDER SURGERY Right     TONSILLECTOMY     trigger thumb Right    TUBAL LIGATION     WISDOM TOOTH EXTRACTION     x4    Prior to Admission medications   Medication Sig Start Date End Date Taking? Authorizing Provider  albuterol (VENTOLIN HFA) 108 (90 Base) MCG/ACT inhaler Inhale 2 puffs into the lungs every 6 (six) hours as needed for wheezing or shortness of breath. 01/08/21  Yes Marin Olp, MD  amLODipine (NORVASC) 5 MG tablet TAKE ONE TABLET BY MOUTH TWICE A DAY Patient taking differently: Take 5 mg by mouth in the morning and at bedtime. TAKE ONE TABLET BY MOUTH TWICE A DAY 10/27/20  Yes Burchette, Alinda Sierras, MD  azelastine (ASTELIN) 0.1 % nasal spray Place 1 spray into both nostrils 2 (two) times daily as needed for rhinitis. Use in each nostril as directed   Yes [provider]  CALCIUM PO Take 1 capsule by mouth daily.   Yes [provider]  Cholecalciferol (VITAMIN D-3) 25 MCG (1000 UT) CAPS Take 1,000 Units by mouth daily.   Yes [provider]  EPINEPHrine 0.3 mg/0.3 mL IJ SOAJ injection Inject 0.3 mg into the muscle as needed for anaphylaxis.    Yes [provider]  gabapentin (NEURONTIN) 100  MG capsule Take 200 mg by mouth at bedtime.   Yes [provider]  levothyroxine (SYNTHROID) 25 MCG tablet TAKE ONE TABLET BY MOUTH DAILY BEFORE BREAKFAST Patient taking differently: Take 25 mcg by mouth daily before breakfast. TAKE ONE TABLET BY MOUTH DAILY BEFORE BREAKFAST; 11/23/20  Yes Burchette, Alinda Sierras, MD  metoprolol tartrate (LOPRESSOR) 25 MG tablet TAKE TWO TABLETS BY MOUTH EVERY MORNING AND TAKE ONE TABLET BY MOUTH EVERY EVENING Patient taking differently: Take 25-50 mg by mouth See admin instructions. TAKE TWO TABLETS BY MOUTH EVERY MORNING AND TAKE ONE TABLET BY MOUTH EVERY EVENING. 11/23/20  Yes Burchette, Alinda Sierras, MD  OVER THE COUNTER MEDICATION Apply 1 application topically at bedtime. Topricin foot cream   Yes [provider]  pantoprazole (PROTONIX)  40 MG tablet TAKE ONE TABLET BY MOUTH DAILY Patient taking differently: Take 20 mg by mouth daily. 10/28/20  Yes Fay Records, MD  vitamin B-12 (CYANOCOBALAMIN) 500 MCG tablet Take 500 mcg by mouth 2 (two) times daily.   Yes [provider]  Probiotic Product (ALIGN) 4 MG CAPS Take 4 mg by mouth daily.    [provider]   Allergies  Allergen Reactions   Other Anaphylaxis, Shortness Of Breath, Swelling and Rash    ALL NUTS!!!!   Peanut-Containing Drug Products Anaphylaxis, Shortness Of Breath, Swelling and Rash   Pecan Nut (Diagnostic) Anaphylaxis, Shortness Of Breath and Rash   Penicillins Swelling    Full facial swelling Did it involve swelling of the face/tongue/throat, SOB, or low BP? Yes Did it involve sudden or severe rash/hives, skin peeling, or any reaction on the inside of your mouth or nose? Yes Did you need to seek medical attention at a hospital or doctor's office? No When did it last happen? "many years ago" If all above answers are "NO", may proceed with cephalosporin use.     Social History   Tobacco Use   Smoking status: Never   Smokeless tobacco: Never  Substance Use Topics   Alcohol use: Yes    Comment: 1-2 per night. consider eliminating with memory issues.    Family History  Problem Relation Age of Onset   Ovarian cancer Mother        in 80s   Heart disease Father        died at 63   Stroke Father    Allergies Father    Arthritis Father    Cancer Sister        brain. 42 died   Healthy Sister    Breast cancer Maternal Aunt    Other Brother        died at pneumonia young   Colon cancer Neg Hx      Review of Systems  Positive ROS: Negative  All other systems have been reviewed and were otherwise negative with the exception of those mentioned in the HPI and as above.  Objective: Vital signs in last 24 hours: Temp:  [97.7 F (36.5 C)] 97.7 F (36.5 C) (08/31 0819) Pulse Rate:  [72] 72 (08/31 0819) Resp:  [17] 17 (08/31  0819) BP: (165)/(68) 165/68 (08/31 0819) SpO2:  [97 %] 97 % (08/31 0819) Weight:  [64.4 kg] 64.4 kg (08/31 0819)  General Appearance: Alert, cooperative, no distress, appears stated age Head: Normocephalic, without obvious abnormality, atraumatic Eyes: PERRL, conjunctiva/corneas clear, EOM's intact    Neck: Supple, symmetrical, trachea midline Back: Symmetric, no curvature, ROM normal, no CVA tenderness Lungs:  respirations unlabored Heart: Regular rate and  rhythm Abdomen: Soft, non-tender Extremities: Extremities normal, atraumatic, no cyanosis or edema Pulses: 2+ and symmetric all extremities Skin: Skin color, texture, turgor normal, no rashes or lesions  NEUROLOGIC:   Mental status: Alert and oriented x4,  no aphasia, good attention span, fund of knowledge, and memory Motor Exam - grossly normal Sensory Exam - grossly normal Reflexes: 1+ Coordination - grossly normal Gait - grossly normal Balance - grossly normal Cranial Nerves: I: smell Not tested  II: visual acuity  OS: nl    OD: nl  II: visual fields Full to confrontation  II: pupils Equal, round, reactive to light  III,VII: ptosis None  III,IV,VI: extraocular muscles  Full ROM  V: mastication Normal  V: facial light touch sensation  Normal  V,VII: corneal reflex  Present  VII: facial muscle function - upper  Normal  VII: facial muscle function - lower Normal  VIII: hearing Not tested  IX: soft palate elevation  Normal  IX,X: gag reflex Present  XI: trapezius strength  5/5  XI: sternocleidomastoid strength 5/5  XI: neck flexion strength  5/5  XII: tongue strength  Normal    Data Review Lab Results  Component Value Date   WBC 8.5 01/21/2021   HGB 14.4 01/21/2021   HCT 42.5 01/21/2021   MCV 94.2 01/21/2021   PLT 325 01/21/2021   Lab Results  Component Value Date   NA 137 01/21/2021   K 3.7 01/21/2021   CL 103 01/21/2021   CO2 23 01/21/2021   BUN 18 01/21/2021   CREATININE 0.82 01/21/2021   GLUCOSE  117 (H) 01/21/2021   Lab Results  Component Value Date   INR 0.9 01/21/2021    Assessment/Plan:  Estimated body mass index is 24.37 kg/m as calculated from the following:   Height as of this encounter: '5\' 4"'$  (1.626 m).   Weight as of this encounter: 64.4 kg. Patient admitted for decompressive hemilaminectomies L3-4 and L4-5 with posterior lateral fusion and posterior fixation L4-5. Patient has failed a reasonable attempt at conservative therapy.  I explained the condition and procedure to the patient and answered any questions.  Patient wishes to proceed with procedure as planned. Understands risks/ benefits and typical outcomes of procedure.   Eustace Moore 01/27/2021 9:18 AM

## 2021-01-27 NOTE — Transfer of Care (Signed)
Immediate Anesthesia Transfer of Care Note  Patient: Sara Medina  Procedure(s) Performed: Laminectomy and Foraminotomy - Lumbar Three-Four/Lumbar Four-Five, posterior fusion with fixation Lumbar Four-Five (Back)  Patient Location: PACU  Anesthesia Type:General  Level of Consciousness: awake and alert   Airway & Oxygen Therapy: Patient Spontanous Breathing and Patient connected to face mask oxygen  Post-op Assessment: Report given to RN, Post -op Vital signs reviewed and stable and Patient moving all extremities X 4  Post vital signs: Reviewed and stable  Last Vitals:  Vitals Value Taken Time  BP 127/65 01/27/21 1300  Temp    Pulse 64 01/27/21 1303  Resp 20 01/27/21 1303  SpO2 96 % 01/27/21 1303  Vitals shown include unvalidated device data.  Last Pain:  Vitals:   01/27/21 0836  TempSrc:   PainSc: 0-No pain         Complications: No notable events documented.

## 2021-01-27 NOTE — Op Note (Signed)
01/27/2021  12:52 PM  PATIENT:  Sara Medina  87 y.o. female  PRE-OPERATIVE DIAGNOSIS: Severe lumbar spinal stenosis L3-4 L4-5, spondylolisthesis L4-5, back and leg pain  POST-OPERATIVE DIAGNOSIS:  same  PROCEDURE:  1.  Decompressive lumbar hemilaminectomy medial facetectomy and foraminotomies L3-4 bilaterally, left L4-5 decompressive lumbar hemilaminectomy medial facetectomy and foraminotomies with sublaminar decompression , 2.  Posterior lateral arthrodesis L4-5 utilizing locally harvested morselized autologous bone graft and morselized allograft, 3.  Nonsegmental fixation L4-5  SURGEON:  Sherley Bounds, MD  ASSISTANTS: Duffy Rhody, MD  ANESTHESIA:   General  EBL: Less than 100 ml  Total I/O In: -  Out: 200 [Blood:200]  BLOOD ADMINISTERED: none  DRAINS: None  SPECIMEN:  none  INDICATION FOR PROCEDURE: This patient presented with back and leg. Imaging showed severe stenosis L3-4 and L4-5 with a dynamic spondylolisthesis at L4-5. The patient tried conservative measures without relief. Pain was debilitating. Recommended decompression at L3-4 and L4-5 with instrumented fusion L4-5. Patient understood the risks, benefits, and alternatives and potential outcomes and wished to proceed.  PROCEDURE DETAILS: The patient was taken to the operating room and after induction of adequate generalized endotracheal anesthesia, the patient was rolled into the prone position on the Wilson frame and all pressure points were padded. The lumbar region was cleaned with Betadine scrub and then prepped with DuraPrep and draped in the usual sterile fashion. 5 cc of local anesthesia was injected and then a dorsal midline incision was made and carried down to the lumbo sacral fascia. The fascia was opened and the paraspinous musculature was taken down in a subperiosteal fashion to expose L3-4 and L4-5 bilaterally. Intraoperative fluoroscopy confirmed my level, and then I used a combination of the high-speed  drill and the Kerrison punches to perform a hemilaminectomy, medial facetectomy, and foraminotomy at L3-4 bilaterally and L4-5 on the left.  The drill shavings were saved in the mucous trap for later arthrodesis and any bone that was removed was saved for arthrodesis.  The underlying yellow ligament was opened and removed in a piecemeal fashion to expose the underlying dura and exiting nerve root.  He had very severe spinal stenosis and severe compression from the yellow ligament.  I undercut the lateral recess and dissected down until I was medial to and distal to the pedicle at L3-4 bilaterally and L4-5 on the left. The nerve root was well decompressed.  At L4-5 I drilled up under the spinous process and performed a sublaminar decompression to decompress the right lateral recess until the pedicle was felt the nerve was seen. I then palpated with a coronary dilator along the nerve root and into the foramen to assure adequate decompression. I felt no more compression of the nerve roots at either level.  I dissected out over the facets and identify the transverse processes of L4 and L5 bilaterally.  We decorticated these and then placed a mixture of local autograft saved during the decompression and morselized allograft to perform intertransverse arthrodesis L4-5 bilateral.  We remove the interspinous ligament at L4-5 and used a medium Alphatec interspinous plate and clamped this into position at L4-5.  It was clamped sequentially and then tightened.  We then checked this with AP and lateral fluoroscopy and felt that it was adequately placed.  I pulled on it and it appeared to to have good purchase . I irrigated with saline solution containing bacitracin. Achieved hemostasis with bipolar cautery, lined the dura with Gelfoam, and then closed the fascia with 0 Vicryl.  I closed the subcutaneous tissues with 2-0 Vicryl and the subcuticular tissues with 3-0 Vicryl. The skin was then closed with benzoin and Steri-Strips.  The drapes were removed, a sterile dressing was applied.  My nurse practitioner and Dr. Marcello Moores was involved in the exposure, safe retraction of the neural elements, the disc work and the closure. the patient was awakened from general anesthesia and transferred to the recovery room in stable condition. At the end of the procedure all sponge, needle and instrument counts were correct.    PLAN OF CARE: Admit for overnight observation  PATIENT DISPOSITION:  PACU - hemodynamically stable.   Delay start of Pharmacological VTE agent (>24hrs) due to surgical blood loss or risk of bleeding:  yes

## 2021-01-27 NOTE — Progress Notes (Signed)
Pharmacy Antibiotic Note  LANETTE BALDNER is a 87 y.o. female admitted on 01/27/2021 with spinal stenosis s/p spinal surgery.  Pharmacy has been consulted for Vancomycin dosing for post-op prophylaxis.  Pt has a reported PCN allergy = facial swelling.  Pt received 1gm of Vancomycin pre-op at 0830.  Plan: Vancomycin '750mg'$  IV x 1 dose to be given 9/1 at 0600.  Height: '5\' 4"'$  (162.6 cm) Weight: 64.4 kg (142 lb) IBW/kg (Calculated) : 54.7  Temp (24hrs), Avg:97.6 F (36.4 C), Min:97.4 F (36.3 C), Max:97.7 F (36.5 C)  Recent Labs  Lab 01/21/21 1500 01/21/21 1516  WBC 8.5  --   CREATININE  --  0.82    Estimated Creatinine Clearance: 40.2 mL/min (by C-G formula based on SCr of 0.82 mg/dL).    Allergies  Allergen Reactions   Other Anaphylaxis, Shortness Of Breath, Swelling and Rash    ALL NUTS!!!!   Peanut-Containing Drug Products Anaphylaxis, Shortness Of Breath, Swelling and Rash   Pecan Nut (Diagnostic) Anaphylaxis, Shortness Of Breath and Rash   Penicillins Swelling    Full facial swelling Did it involve swelling of the face/tongue/throat, SOB, or low BP? Yes Did it involve sudden or severe rash/hives, skin peeling, or any reaction on the inside of your mouth or nose? Yes Did you need to seek medical attention at a hospital or doctor's office? No When did it last happen? "many years ago" If all above answers are "NO", may proceed with cephalosporin use.     Thank you for allowing pharmacy to be a part of this patient's care.  Elmer Ramp 01/27/2021 4:38 PM

## 2021-01-27 NOTE — Anesthesia Procedure Notes (Signed)
Procedure Name: Intubation Date/Time: 01/27/2021 10:20 AM Performed by: Annamary Carolin, CRNA Pre-anesthesia Checklist: Patient identified, Emergency Drugs available, Suction available and Patient being monitored Patient Re-evaluated:Patient Re-evaluated prior to induction Oxygen Delivery Method: Circle System Utilized Preoxygenation: Pre-oxygenation with 100% oxygen Induction Type: IV induction Ventilation: Mask ventilation without difficulty Laryngoscope Size: Miller and 2 Grade View: Grade III Tube type: Oral Tube size: 7.0 mm Number of attempts: 4 Airway Equipment and Method: Stylet and Oral airway Placement Confirmation: ETT inserted through vocal cords under direct vision, positive ETCO2 and breath sounds checked- equal and bilateral Tube secured with: Tape Dental Injury: Teeth and Oropharynx as per pre-operative assessment  Comments: CRNA x2 - MAC 3 to [employer] 2. MDA x2 - Sabra Heck 2. Dentition / lips as preop.

## 2021-01-27 NOTE — Anesthesia Postprocedure Evaluation (Signed)
Anesthesia Post Note  Patient: CHAMIQUE MATUSEK  Procedure(s) Performed: Laminectomy and Foraminotomy - Lumbar Three-Four/Lumbar Four-Five, posterior fusion with fixation Lumbar Four-Five (Back)     Patient location during evaluation: PACU Anesthesia Type: General Level of consciousness: awake and alert Pain management: pain level controlled Vital Signs Assessment: post-procedure vital signs reviewed and stable Respiratory status: spontaneous breathing, nonlabored ventilation, respiratory function stable and patient connected to nasal cannula oxygen Cardiovascular status: blood pressure returned to baseline and stable Postop Assessment: no apparent nausea or vomiting Anesthetic complications: no   No notable events documented.  Last Vitals:  Vitals:   01/27/21 1455 01/27/21 1500  BP:  115/63  Pulse: (!) 56 (!) 58  Resp: 14 17  Temp: (!) 36.3 C   SpO2: 97% 96%    Last Pain:  Vitals:   01/27/21 1430  TempSrc:   PainSc: 4                  Makailey Hodgkin P Gerrell Tabet

## 2021-01-28 MED ORDER — TRAMADOL HCL 50 MG PO TABS: 50.0000 mg | ORAL_TABLET | Freq: Four times a day (QID) | ORAL | Status: DC | PRN

## 2021-01-28 MED ORDER — TRAMADOL HCL 50 MG PO TABS: 50.0000 mg | ORAL_TABLET | Freq: Four times a day (QID) | ORAL | 0 refills | Status: DC | PRN

## 2021-01-28 NOTE — Progress Notes (Signed)
Patient was transported via wheelchair by volunteer for discharge home; in no acute distress nor complaints of pain nor discomfort; room was checked and accounted for all her belongings; discharge instructions given to patient's daughter and she verbalized understanding on the instructions given.

## 2021-01-28 NOTE — TOC Initial Note (Addendum)
Transition of Care [facility]) - Initial/Assessment Note    Patient Details  Name: Sara Medina MRN: [id number] Date of Birth: [birth year]  Transition of Care [facility]) CM/SW Contact:    Joanne Chars, LCSW Phone Number: 01/28/2021, 9:11 AM  Clinical Narrative:   CSW met with pt and daughter Izora Gala regarding recommendation for [facility].  Permission given to speak with daughter present.  Pt somewhat confused, information from daughter.  Pt is in a cottage at [facility], 4 hours per day of [place] aide currently and they will be increasing this.  Requesting PT/OT through [place], which can be done through [facility] [facility] on site.  PCP in place.  Pt is vaccinated for covid with both boosters.   1000: CSW spoke with Rollene Fare at [facility], faxed [facility] order to her, she will contact family to schedule.                   Expected Discharge Plan: Assisted Living Barriers to Discharge: No Barriers Identified   Patient Goals and CMS Choice Patient states their goals for this hospitalization and ongoing recovery are:: walk without pain CMS Medicare.gov Compare Post Acute Care list provided to:: Patient Represenative (must comment) Choice offered to / list presented to : Spouse  Expected Discharge Plan and Services Expected Discharge Plan: Assisted Living In-house Referral: Clinical Social Work   Post Acute Care Choice: [place] arrangements for the past 2 months: [place] Expected Discharge Date: 01/28/21               DME Arranged: N/A (DME through [facility] staff)                    Prior Living Arrangements/Services Living arrangements for the past 2 months: [place] Lives with:: Facility Resident Patient language and need for interpreter reviewed:: Yes Do you feel safe going back to the place where you live?: Yes      Need for Family Participation in Patient Care: Yes (Comment) Care giver support system in place?: Yes (comment) Current home services: Homehealth  aide Criminal Activity/Legal Involvement Pertinent to Current Situation/Hospitalization: No - Comment as needed  Activities of Daily Living      Permission Sought/Granted Permission sought to share information with : Family Supports Permission granted to share information with : Yes, Verbal Permission Granted  Share Information with NAME: daughter Izora Gala           Emotional Assessment Appearance:: Appears stated age Attitude/Demeanor/Rapport: Engaged Affect (typically observed): Appropriate, Pleasant Orientation: : Oriented to Self, Oriented to Place, Oriented to  Time, Oriented to Situation Alcohol / Substance Use: Not Applicable Psych Involvement: No (comment)  Admission diagnosis:  S/P lumbar fusion [Z98.1] Patient Active Problem List   Diagnosis Date Noted   S/P lumbar fusion 01/27/2021   Chronic diarrhea 12/30/2020   Burning sensation of feet 06/21/2020   Cystocele 10/02/2014   Palpitations 06/30/2014   Asthma, exercise induced 08/18/2010   COLONIC POLYPS 05/04/2010   Hypothyroidism 05/04/2010   Hyperlipidemia, unspecified 05/04/2010   Essential hypertension 05/04/2010   ALLERGIC RHINITIS 05/04/2010   GERD 05/04/2010   Osteoarthritis 05/04/2010   DIVERTICULITIS, HX OF 05/04/2010   PCP:  Marin Olp, MD Pharmacy:   [facility] [id number] - [address], [place] - [address] [place] [address] Phone: [phone] Fax: [phone]     Social Determinants of Health (SDOH) Interventions    Readmission Risk Interventions No flowsheet data found.

## 2021-01-28 NOTE — Evaluation (Signed)
Occupational Therapy Evaluation Patient Details Name: Sara Medina MRN: [id number] DOB: 06/15/1929 Today's Date: 01/28/2021    History of Present Illness Pt is an 87 y/o female who presents s/p L4-L5 laminectomy with posterior lateral arthrodesis on 01/27/2021. PMH significant for dementia, essential HTN, hypothyroidism, MVP, neuropathy, OA, R shoulder surgery.   Clinical Impression   Patient admitted for the above diagnosis and procedure.  PTA she lives in an ALF, with 4 hour/day PCA and PRN assist from daughters.  She does have cognitive dysfunction at baseline, but her memory and processing have been further impacted by the surgery.  She is moving well, but needs constant oversight and cueing to ensure compliance with ADL performance and back precautions.  OT is recommended at her ALF, and 24 hour supervision is needed.      Follow Up Recommendations  Home health OT    Equipment Recommendations  Other (comment) (reacher and sponge)    Recommendations for Other Services       Precautions / Restrictions Precautions Precautions: Fall;Back Precaution Booklet Issued: Yes (comment) Precaution Comments: Reviewed precautions verbally. Gave handout so family/caregivers would have a reference. Required Braces or Orthoses: Spinal Brace Spinal Brace: Lumbar corset;Applied in sitting position Restrictions Weight Bearing Restrictions: No      Mobility Bed Mobility Overal bed mobility: Needs Assistance Bed Mobility: Rolling;Sidelying to Sit Rolling: Min guard Sidelying to sit: Min guard       General bed mobility comments: No assist required, however hands-on guarding provided for optimal technique. Patient Response: Restless;Cooperative  Transfers Overall transfer level: Needs assistance Equipment used: Rolling walker (2 wheeled) Transfers: Sit to/from Stand Sit to Stand: Min guard         General transfer comment: Assist to power-up to full standing position. VC's for hand  placement on seated surface for safety.    Balance Overall balance assessment: Needs assistance Sitting-balance support: Feet supported;No upper extremity supported Sitting balance-Leahy Scale: Fair     Standing balance support: During functional activity;Bilateral upper extremity supported;No upper extremity supported Standing balance-Leahy Scale: Poor Standing balance comment: Reliant on UE support, but able to static stand with supervision                           ADL either performed or assessed with clinical judgement   ADL                   Upper Body Dressing : Supervision/safety;Sitting;Standing   Lower Body Dressing: Supervision/safety;Sit to/from stand               Functional mobility during ADLs: Supervision/safety;Rolling walker       Vision Patient Visual Report: No change from baseline       Perception     Praxis      Pertinent Vitals/Pain Pain Assessment: Faces Faces Pain Scale: Hurts a little bit Pain Location: low back Pain Descriptors / Indicators: Aching Pain Intervention(s): Monitored during session     Hand Dominance Right   Extremity/Trunk Assessment Upper Extremity Assessment Upper Extremity Assessment: Overall WFL for tasks assessed   Lower Extremity Assessment Lower Extremity Assessment: Defer to PT evaluation   Cervical / Trunk Assessment Cervical / Trunk Assessment: Other exceptions Cervical / Trunk Exceptions: s/p surgery   Communication Communication Communication: No difficulties   Cognition Arousal/Alertness: Awake/alert Behavior During Therapy: Restless;Impulsive Overall Cognitive Status: Impaired/Different from baseline Area of Impairment: Orientation;Attention;Memory;Following commands;Safety/judgement;Awareness;Problem solving  Orientation Level: Disoriented to;Time (Gave maiden name initially, and increased time to remember DOB) Current Attention Level: Selective Memory:  Decreased recall of precautions;Decreased short-term memory Following Commands: Follows one step commands with increased time;Follows one step commands inconsistently Safety/Judgement: Decreased awareness of safety;Decreased awareness of deficits Awareness: Intellectual Problem Solving: Slow processing;Decreased initiation;Difficulty sequencing;Requires verbal cues General Comments: Difficulty sequencing. Attempting to wipe before she had peed while sitting on the toilet.   General Comments       Exercises     Shoulder Instructions      Home Living Family/patient expects to be discharged to:: Private residence Living Arrangements: Alone Available Help at Discharge: Family;Personal care attendant;Available 24 hours/day Type of Home: House Home Access: Level entry     Home Layout: One level     Bathroom Shower/Tub: Occupational psychologist: Handicapped height Bathroom Accessibility: Yes How Accessible: Accessible via walker Home Equipment: [place] - 4 wheels          Prior Functioning/Environment Level of Independence: Needs assistance  Gait / Transfers Assistance Needed: per daughter, mod I with rollator ADL's / Homemaking Assistance Needed: Per daughter, performing all ADL's herself however required assist for IADL's and [facility] providing aide assistance.            OT Problem List: Decreased safety awareness;Decreased knowledge of precautions;Impaired balance (sitting and/or standing)      OT Treatment/Interventions:      OT Goals(Current goals can be found in the care plan section) Acute Rehab OT Goals Patient Stated Goal: return home OT Goal Formulation: With patient Time For Goal Achievement: 01/28/21 Potential to Achieve Goals: Good  OT Frequency:     Barriers to D/C:  Cognition          Co-evaluation              AM-PAC OT "6 Clicks" Daily Activity     Outcome Measure Help from another person eating meals?: None Help from  another person taking care of personal grooming?: None Help from another person toileting, which includes using toliet, bedpan, or urinal?: A Little Help from another person bathing (including washing, rinsing, drying)?: A Little Help from another person to put on and taking off regular upper body clothing?: A Little Help from another person to put on and taking off regular lower body clothing?: A Little 6 Click Score: 20   End of Session    Activity Tolerance: Patient tolerated treatment well Patient left: in bed;with call bell/phone within reach;with family/visitor present  OT Visit Diagnosis: Unsteadiness on feet (R26.81);Other symptoms and signs involving cognitive function                Time: 0940-1000 OT Time Calculation (min): 20 min Charges:  OT General Charges $OT Visit: 1 Visit OT Evaluation $OT Eval Moderate Complexity: 1 Mod  01/28/2021  RP, OTR/L  Acute Rehabilitation Services  Office:  [place] 01/28/2021, 11:07 AM

## 2021-01-28 NOTE — Discharge Summary (Signed)
Physician Discharge Summary  Patient ID: Sara Medina MRN: [id number] DOB/AGE: 01/18/1930 87 y.o.  Admit date: 01/27/2021 Discharge date: 01/28/2021  Admission Diagnoses: spondylolisthesis with stenosis   Discharge Diagnoses: same   Discharged Condition: good  Hospital Course: The patient was admitted on 01/27/2021 and taken to the operating room where the patient underwent decompression and fusion L4-5, DLL L3-4. The patient tolerated the procedure well and was taken to the recovery room and then to the floor in stable condition. The hospital course was routine. There were no complications. The wound remained clean dry and intact. Pt had appropriate back soreness. No complaints of leg pain or new N/T/W. The patient remained afebrile with stable vital signs, and tolerated a regular diet. The patient continued to increase activities, and pain was well controlled with oral pain medications.   Consults: None  Significant Diagnostic Studies:  Results for orders placed or performed during the hospital encounter of 01/27/21  Type and screen  Result Value Ref Range   ABO/RH(D) A POS    Antibody Screen POS    Sample Expiration      01/30/2021,2359 Performed at [facility], [place] [address], [place], [place] [address]     DG Lumbar Spine 2-3 Views  Result Date: 01/27/2021 CLINICAL DATA:  L5-S1 fusion EXAM: LUMBAR SPINE - 2-3 VIEW COMPARISON:  01/14/2021 FINDINGS: AP and lateral C-arm images lumbar spine obtained. Grade 1 anterolisthesis L4-5. Interval placement of X stop device on the spinous processes, likely L4-5 IMPRESSION: X stop device L4-5 Electronically Signed   By: Franchot Gallo M.D.   On: 01/27/2021 20:08   DG C-Arm 1-60 Min-No Report  Result Date: 01/27/2021 Fluoroscopy was utilized by the requesting physician.  No radiographic interpretation.    Antibiotics:  Anti-infectives (From admission, onward)    Start     Dose/Rate Route Frequency Ordered Stop   01/28/21 0600   vancomycin (VANCOREADY) IVPB 750 mg/150 mL        750 mg 150 mL/hr over 60 Minutes Intravenous  Once 01/27/21 1643 01/28/21 0546   01/27/21 0830  vancomycin (VANCOCIN) IVPB 1000 mg/200 mL premix        1,000 mg 200 mL/hr over 60 Minutes Intravenous On call to O.R. 01/27/21 0818 01/27/21 0937       Discharge Exam: Blood pressure (!) 152/62, pulse 74, temperature 98 F (36.7 C), temperature source Oral, resp. rate 20, height '5\' 4"'$  (1.626 m), weight 64.4 kg, SpO2 96 %. Neurologic: Grossly normal, with some dementia and disorientation Dressing dry  Discharge Medications:    Resume home meds, tramadol Rxed   Disposition: home with HH, has care giver at home employed by family, 2 daughters available and I spoke with nancy about staying with her a few days   Final Dx: decompression L3-4 L4-5, fusion L4-5  Discharge Instructions      Remove dressing in 72 hours   Complete by: As directed    Call MD for:  difficulty breathing, headache or visual disturbances   Complete by: As directed    Call MD for:  persistant nausea and vomiting   Complete by: As directed    Call MD for:  redness, tenderness, or signs of infection (pain, swelling, redness, odor or green/yellow discharge around incision site)   Complete by: As directed    Call MD for:  severe uncontrolled pain   Complete by: As directed    Call MD for:  temperature >100.4   Complete by: As directed  Diet - low sodium heart healthy   Complete by: As directed    Increase activity slowly   Complete by: As directed           Signed: Eustace Moore 01/28/2021, 8:00 AM

## 2021-01-28 NOTE — Evaluation (Signed)
Physical Therapy Evaluation Patient Details Name: Sara Medina MRN: [id number] DOB: February 15, 1930 Today's Date: 01/28/2021   History of Present Illness  Pt is an 87 y/o female who presents s/p L4-L5 laminectomy with posterior lateral arthrodesis on 01/27/2021. PMH significant for dementia, essential HTN, hypothyroidism, MVP, neuropathy, OA, R shoulder surgery.  Clinical Impression  Pt admitted with above diagnosis. At the time of PT eval, pt was able to demonstrate transfers and ambulation with gross min guard assist to min assist and RW for support. Pt with decreased cognition and family reports this is the worst they've seen it. Pt will require 24 hour support at home at least initially, with supervision for all ADL's including toileting. Pt and family were educated on precautions, brace application/wearing schedule, appropriate activity progression, and car transfer. Pt currently with functional limitations due to the deficits listed below (see PT Problem List). Pt will benefit from skilled PT to increase their independence and safety with mobility to allow discharge to the venue listed below.      Follow Up Recommendations Home health PT;Supervision/Assistance - 24 hour    Equipment Recommendations  None recommended by PT    Recommendations for Other Services       Precautions / Restrictions Precautions Precautions: Fall;Back Precaution Booklet Issued: Yes (comment) Precaution Comments: Reviewed precautions verbally. Gave handout so family/caregivers would have a reference. Required Braces or Orthoses: Spinal Brace Spinal Brace: Lumbar corset;Applied in sitting position Restrictions Weight Bearing Restrictions: No      Mobility  Bed Mobility Overal bed mobility: Needs Assistance Bed Mobility: Rolling;Sidelying to Sit Rolling: Min guard Sidelying to sit: Min guard       General bed mobility comments: No assist required, however hands-on guarding provided for optimal technique.     Transfers Overall transfer level: Needs assistance Equipment used: Rolling walker (2 wheeled) Transfers: Sit to/from Stand Sit to Stand: Min assist         General transfer comment: Assist to power-up to full standing position. VC's for hand placement on seated surface for safety.  Ambulation/Gait Ambulation/Gait assistance: Min assist Gait Distance (Feet): 150 Feet Assistive device: Rolling walker (2 wheeled) Gait Pattern/deviations: Step-through pattern;Decreased stride length;Trunk flexed Gait velocity: Decreased Gait velocity interpretation: 1.31 - 2.62 ft/sec, indicative of limited community ambulator General Gait Details: VC's for improved posture, closer walker proximity, and forward gaze. Occasional assist required for walker management and balance.  Stairs            Wheelchair Mobility    Modified Rankin (Stroke Patients Only)       Balance Overall balance assessment: Needs assistance Sitting-balance support: Feet supported;No upper extremity supported Sitting balance-Leahy Scale: Fair     Standing balance support: No upper extremity supported;During functional activity Standing balance-Leahy Scale: Poor Standing balance comment: Reliant on UE support                             Pertinent Vitals/Pain      Home Living Family/patient expects to be discharged to:: Private residence Living Arrangements: Alone Available Help at Discharge: Family;Personal care attendant;Available 24 hours/day (At least initially) Type of Home: [place]" at [facility]) Home Access: Level entry     Home Layout: One level Home Equipment: Environmental consultant - 4 wheels      Prior Function Level of Independence: Needs assistance   Gait / Transfers Assistance Needed: per daughter, mod I with rollator  ADL's / Homemaking Assistance Needed: Per daughter, performing  all ADL's herself however required assist for IADL's and [facility] providing aide  assistance.        Hand Dominance        Extremity/Trunk Assessment   Upper Extremity Assessment Upper Extremity Assessment: Defer to OT evaluation    Lower Extremity Assessment Lower Extremity Assessment: Generalized weakness (Consistent with pre-op diagnosis)    Cervical / Trunk Assessment Cervical / Trunk Assessment: Kyphotic;Other exceptions Cervical / Trunk Exceptions: s/p surgery  Communication   Communication: No difficulties  Cognition Arousal/Alertness: Awake/alert Behavior During Therapy: Restless;Impulsive Overall Cognitive Status: Impaired/Different from baseline Area of Impairment: Orientation;Attention;Memory;Following commands;Safety/judgement;Awareness;Problem solving                 Orientation Level: Disoriented to;Time (Gave maiden name initially, and increased time to remember DOB) Current Attention Level: Selective Memory: Decreased recall of precautions;Decreased short-term memory Following Commands: Follows one step commands with increased time;Follows one step commands inconsistently (Cannot follow 2+ step commands. needs repetition of 1-step commands) Safety/Judgement: Decreased awareness of safety;Decreased awareness of deficits Awareness: Intellectual Problem Solving: Slow processing;Decreased initiation;Difficulty sequencing;Requires verbal cues General Comments: Difficulty sequencing. Attempting to wipe before she had peed while sitting on the toilet.      General Comments      Exercises     Assessment/Plan    PT Assessment Patient needs continued PT services  PT Problem List Decreased strength;Decreased activity tolerance;Decreased balance;Decreased mobility;Decreased knowledge of use of DME;Decreased safety awareness;Decreased knowledge of precautions;Pain       PT Treatment Interventions DME instruction;Gait training;Functional mobility training;Therapeutic exercise;Therapeutic activities;Neuromuscular  re-education;Patient/family education;Cognitive remediation    PT Goals (Current goals can be found in the Care Plan section)  Acute Rehab PT Goals Patient Stated Goal: None stated PT Goal Formulation: With patient/family Time For Goal Achievement: 02/04/21 Potential to Achieve Goals: Good    Frequency Min 5X/week   Barriers to discharge        Co-evaluation               AM-PAC PT "6 Clicks" Mobility  Outcome Measure Help needed turning from your back to your side while in a flat bed without using bedrails?: A Little Help needed moving from lying on your back to sitting on the side of a flat bed without using bedrails?: A Little Help needed moving to and from a bed to a chair (including a wheelchair)?: A Little Help needed standing up from a chair using your arms (e.g., wheelchair or bedside chair)?: A Little Help needed to walk in hospital room?: A Little Help needed climbing 3-5 steps with a railing? : A Lot 6 Click Score: 17    End of Session Equipment Utilized During Treatment: Gait belt;Back brace Activity Tolerance: Patient tolerated treatment well Patient left: Other (comment) (Sitting EOB with family and OT present) Nurse Communication: Mobility status PT Visit Diagnosis: Unsteadiness on feet (R26.81);Pain Pain - part of body:  (back)    Time: [id number] PT Time Calculation (min) (ACUTE ONLY): 35 min   Charges:   PT Evaluation $PT Eval Low Complexity: 1 Low PT Treatments $Gait Training: 8-22 mins        Rolinda Roan, PT, DPT Acute Rehabilitation Services Pager: [phone] Office: [place] 01/28/2021, 10:44 AM

## 2021-01-29 ENCOUNTER — Telehealth: Payer: Self-pay

## 2021-01-29 LAB — TYPE AND SCREEN
ABO/RH(D): A POS
Antibody Screen: POSITIVE
Donor AG Type: NEGATIVE
Donor AG Type: NEGATIVE
PT AG Type: NEGATIVE
Unit division: 0
Unit division: 0

## 2021-01-29 LAB — BPAM RBC
Blood Product Expiration Date: 202210022359
Blood Product Expiration Date: 202210022359
Unit Type and Rh: 6200
Unit Type and Rh: 6200

## 2021-01-29 NOTE — Telephone Encounter (Cosign Needed)
Transition Care Management Unsuccessful Follow-up Telephone Call  Date of discharge and from where:  [facility] 01/27/21  Attempts:  1st Attempt  Reason for unsuccessful TCM follow-up call:  Left voice message

## 2021-02-01 ENCOUNTER — Other Ambulatory Visit: Payer: Self-pay | Admitting: Family Medicine

## 2021-02-03 ENCOUNTER — Encounter (HOSPITAL_COMMUNITY): Payer: Self-pay | Admitting: Neurological Surgery

## 2021-02-03 DIAGNOSIS — M6281 Muscle weakness (generalized): Secondary | ICD-10-CM | POA: Diagnosis not present

## 2021-02-03 DIAGNOSIS — R2681 Unsteadiness on feet: Secondary | ICD-10-CM | POA: Diagnosis not present

## 2021-02-03 DIAGNOSIS — Z4789 Encounter for other orthopedic aftercare: Secondary | ICD-10-CM | POA: Diagnosis not present

## 2021-02-03 DIAGNOSIS — M5459 Other low back pain: Secondary | ICD-10-CM | POA: Diagnosis not present

## 2021-02-03 DIAGNOSIS — N3941 Urge incontinence: Secondary | ICD-10-CM | POA: Diagnosis not present

## 2021-02-03 NOTE — Progress Notes (Signed)
02/03/21: TC call from [facility].  Fax from last week did not go through, still needs DC summary, PT/OT note in order to provide home services at [facility].  Information faxed. Lurline Idol, MSW, LCSW 9/7/20223:38 PM

## 2021-02-04 DIAGNOSIS — M6281 Muscle weakness (generalized): Secondary | ICD-10-CM | POA: Diagnosis not present

## 2021-02-04 DIAGNOSIS — M5459 Other low back pain: Secondary | ICD-10-CM | POA: Diagnosis not present

## 2021-02-04 DIAGNOSIS — R2681 Unsteadiness on feet: Secondary | ICD-10-CM | POA: Diagnosis not present

## 2021-02-04 DIAGNOSIS — Z4789 Encounter for other orthopedic aftercare: Secondary | ICD-10-CM | POA: Diagnosis not present

## 2021-02-04 DIAGNOSIS — N3941 Urge incontinence: Secondary | ICD-10-CM | POA: Diagnosis not present

## 2021-02-06 ENCOUNTER — Other Ambulatory Visit: Payer: Self-pay | Admitting: Family Medicine

## 2021-02-08 DIAGNOSIS — Z4789 Encounter for other orthopedic aftercare: Secondary | ICD-10-CM | POA: Diagnosis not present

## 2021-02-08 DIAGNOSIS — R2681 Unsteadiness on feet: Secondary | ICD-10-CM | POA: Diagnosis not present

## 2021-02-08 DIAGNOSIS — N3941 Urge incontinence: Secondary | ICD-10-CM | POA: Diagnosis not present

## 2021-02-08 DIAGNOSIS — M6281 Muscle weakness (generalized): Secondary | ICD-10-CM | POA: Diagnosis not present

## 2021-02-08 DIAGNOSIS — M5459 Other low back pain: Secondary | ICD-10-CM | POA: Diagnosis not present

## 2021-02-09 DIAGNOSIS — M5459 Other low back pain: Secondary | ICD-10-CM | POA: Diagnosis not present

## 2021-02-09 DIAGNOSIS — N3941 Urge incontinence: Secondary | ICD-10-CM | POA: Diagnosis not present

## 2021-02-09 DIAGNOSIS — R2681 Unsteadiness on feet: Secondary | ICD-10-CM | POA: Diagnosis not present

## 2021-02-09 DIAGNOSIS — Z4789 Encounter for other orthopedic aftercare: Secondary | ICD-10-CM | POA: Diagnosis not present

## 2021-02-09 DIAGNOSIS — M6281 Muscle weakness (generalized): Secondary | ICD-10-CM | POA: Diagnosis not present

## 2021-02-10 ENCOUNTER — Ambulatory Visit: Payer: Medicare Other | Admitting: Gastroenterology

## 2021-02-10 DIAGNOSIS — Z20828 Contact with and (suspected) exposure to other viral communicable diseases: Secondary | ICD-10-CM | POA: Diagnosis not present

## 2021-02-11 ENCOUNTER — Encounter: Payer: Self-pay | Admitting: Family Medicine

## 2021-02-11 ENCOUNTER — Telehealth: Payer: Self-pay

## 2021-02-11 DIAGNOSIS — M5459 Other low back pain: Secondary | ICD-10-CM | POA: Diagnosis not present

## 2021-02-11 DIAGNOSIS — R2681 Unsteadiness on feet: Secondary | ICD-10-CM | POA: Diagnosis not present

## 2021-02-11 DIAGNOSIS — Z4789 Encounter for other orthopedic aftercare: Secondary | ICD-10-CM | POA: Diagnosis not present

## 2021-02-11 DIAGNOSIS — M6281 Muscle weakness (generalized): Secondary | ICD-10-CM | POA: Diagnosis not present

## 2021-02-11 DIAGNOSIS — N3941 Urge incontinence: Secondary | ICD-10-CM | POA: Diagnosis not present

## 2021-02-11 NOTE — Telephone Encounter (Signed)
Daughter called stating that she would like Rehab to have blood work done to check for diabetes. She stated she stays thirsty. Sara Medina would like to know if a nurse can go out to her mother at 75 to draw the blood instead of her coming in the office. Please Advise.

## 2021-02-12 ENCOUNTER — Other Ambulatory Visit: Payer: Self-pay

## 2021-02-12 ENCOUNTER — Encounter (HOSPITAL_BASED_OUTPATIENT_CLINIC_OR_DEPARTMENT_OTHER): Payer: Self-pay | Admitting: *Deleted

## 2021-02-12 ENCOUNTER — Emergency Department (HOSPITAL_BASED_OUTPATIENT_CLINIC_OR_DEPARTMENT_OTHER): Payer: Medicare Other

## 2021-02-12 ENCOUNTER — Emergency Department (HOSPITAL_BASED_OUTPATIENT_CLINIC_OR_DEPARTMENT_OTHER): Payer: Medicare Other | Admitting: Radiology

## 2021-02-12 ENCOUNTER — Emergency Department (HOSPITAL_BASED_OUTPATIENT_CLINIC_OR_DEPARTMENT_OTHER)
Admission: EM | Admit: 2021-02-12 | Discharge: 2021-02-12 | Disposition: A | Discharge status: Home / Self Care | Payer: Medicare Other | Attending: Emergency Medicine | Admitting: Emergency Medicine

## 2021-02-12 ENCOUNTER — Telehealth: Payer: Medicare Other | Admitting: Physician Assistant

## 2021-02-12 DIAGNOSIS — E039 Hypothyroidism, unspecified: Secondary | ICD-10-CM | POA: Insufficient documentation

## 2021-02-12 DIAGNOSIS — Z79899 Other long term (current) drug therapy: Secondary | ICD-10-CM | POA: Insufficient documentation

## 2021-02-12 DIAGNOSIS — N390 Urinary tract infection, site not specified: Secondary | ICD-10-CM | POA: Diagnosis not present

## 2021-02-12 DIAGNOSIS — G319 Degenerative disease of nervous system, unspecified: Secondary | ICD-10-CM | POA: Diagnosis not present

## 2021-02-12 DIAGNOSIS — R410 Disorientation, unspecified: Secondary | ICD-10-CM | POA: Diagnosis not present

## 2021-02-12 DIAGNOSIS — R531 Weakness: Secondary | ICD-10-CM | POA: Diagnosis not present

## 2021-02-12 DIAGNOSIS — D72829 Elevated white blood cell count, unspecified: Secondary | ICD-10-CM | POA: Insufficient documentation

## 2021-02-12 DIAGNOSIS — I10 Essential (primary) hypertension: Secondary | ICD-10-CM | POA: Diagnosis not present

## 2021-02-12 DIAGNOSIS — R4182 Altered mental status, unspecified: Secondary | ICD-10-CM | POA: Diagnosis present

## 2021-02-12 DIAGNOSIS — Z91010 Allergy to peanuts: Secondary | ICD-10-CM | POA: Insufficient documentation

## 2021-02-12 DIAGNOSIS — J45909 Unspecified asthma, uncomplicated: Secondary | ICD-10-CM | POA: Diagnosis not present

## 2021-02-12 DIAGNOSIS — E871 Hypo-osmolality and hyponatremia: Secondary | ICD-10-CM | POA: Insufficient documentation

## 2021-02-12 DIAGNOSIS — I6789 Other cerebrovascular disease: Secondary | ICD-10-CM | POA: Diagnosis not present

## 2021-02-12 DIAGNOSIS — Z20822 Contact with and (suspected) exposure to covid-19: Secondary | ICD-10-CM | POA: Diagnosis not present

## 2021-02-12 LAB — COMPREHENSIVE METABOLIC PANEL
ALT: 23 U/L (ref 0–44)
AST: 15 U/L (ref 15–41)
Albumin: 3.6 g/dL (ref 3.5–5.0)
Alkaline Phosphatase: 117 U/L (ref 38–126)
Anion gap: 12 (ref 5–15)
BUN: 17 mg/dL (ref 8–23)
CO2: 21 mmol/L — ABNORMAL LOW (ref 22–32)
Calcium: 9.2 mg/dL (ref 8.9–10.3)
Chloride: 97 mmol/L — ABNORMAL LOW (ref 98–111)
Creatinine, Ser: 1.17 mg/dL — ABNORMAL HIGH (ref 0.44–1.00)
GFR, Estimated: 45 mL/min — ABNORMAL LOW (ref >60)
Glucose, Bld: 129 mg/dL — ABNORMAL HIGH (ref 70–99)
Potassium: 3.8 mmol/L (ref 3.5–5.1)
Sodium: 130 mmol/L — ABNORMAL LOW (ref 135–145)
Total Bilirubin: 0.4 mg/dL (ref 0.3–1.2)
Total Protein: 6.9 g/dL (ref 6.5–8.1)

## 2021-02-12 LAB — URINALYSIS, ROUTINE W REFLEX MICROSCOPIC
Bilirubin Urine: NEGATIVE
Glucose, UA: NEGATIVE mg/dL
Ketones, ur: NEGATIVE mg/dL
Nitrite: NEGATIVE
Protein, ur: NEGATIVE mg/dL
Specific Gravity, Urine: <1.005 — ABNORMAL LOW (ref 1.005–1.030)
pH: 6 (ref 5.0–8.0)

## 2021-02-12 LAB — CBC WITH DIFFERENTIAL/PLATELET
Abs Immature Granulocytes: 0.14 10*3/uL — ABNORMAL HIGH (ref 0.00–0.07)
Basophils Absolute: 0 10*3/uL (ref 0.0–0.1)
Basophils Relative: 0 %
Eosinophils Absolute: 0.1 10*3/uL (ref 0.0–0.5)
Eosinophils Relative: 0 %
HCT: 32.9 % — ABNORMAL LOW (ref 36.0–46.0)
Hemoglobin: 11.2 g/dL — ABNORMAL LOW (ref 12.0–15.0)
Immature Granulocytes: 1 %
Lymphocytes Relative: 7 %
Lymphs Abs: 1 10*3/uL (ref 0.7–4.0)
MCH: 30.8 pg (ref 26.0–34.0)
MCHC: 34 g/dL (ref 30.0–36.0)
MCV: 90.4 fL (ref 80.0–100.0)
Monocytes Absolute: 1.1 10*3/uL — ABNORMAL HIGH (ref 0.1–1.0)
Monocytes Relative: 8 %
Neutro Abs: 11.5 10*3/uL — ABNORMAL HIGH (ref 1.7–7.7)
Neutrophils Relative %: 84 %
Platelets: 430 10*3/uL — ABNORMAL HIGH (ref 150–400)
RBC: 3.64 MIL/uL — ABNORMAL LOW (ref 3.87–5.11)
RDW: 13.4 % (ref 11.5–15.5)
WBC: 13.9 10*3/uL — ABNORMAL HIGH (ref 4.0–10.5)
nRBC: 0 % (ref 0.0–0.2)

## 2021-02-12 LAB — HEMOGLOBIN A1C
Hgb A1c MFr Bld: 5.7 % — ABNORMAL HIGH (ref 4.8–5.6)
Mean Plasma Glucose: 116.89 mg/dL

## 2021-02-12 LAB — CBG MONITORING, ED: Glucose-Capillary: 115 mg/dL — ABNORMAL HIGH (ref 70–99)

## 2021-02-12 LAB — TSH: TSH: 2.051 u[IU]/mL (ref 0.350–4.500)

## 2021-02-12 LAB — RESP PANEL BY RT-PCR (FLU A&B, COVID) ARPGX2
Influenza A by PCR: NEGATIVE
Influenza B by PCR: NEGATIVE
SARS Coronavirus 2 by RT PCR: NEGATIVE

## 2021-02-12 MED ORDER — NITROFURANTOIN MONOHYD MACRO 100 MG PO CAPS: 100.0000 mg | ORAL_CAPSULE | Freq: Two times a day (BID) | ORAL | 0 refills | Status: AC

## 2021-02-12 MED ORDER — SODIUM CHLORIDE 0.9 % IV BOLUS
500.0000 mL | Freq: Once | INTRAVENOUS | Status: AC
  Administered 2021-02-12: 500 mL via INTRAVENOUS

## 2021-02-12 NOTE — ED Provider Notes (Signed)
[facility] EMERGENCY DEPT Provider Note   CSN: [id number] Arrival date & time: 02/12/21  [id number]     History Chief Complaint  Patient presents with   Weakness   Altered Mental Status    Sara Medina is a 87 y.o. female.  Presenting to ER with multiple different complaints.  History obtained from patient as well as from daughters at bedside.  Patient has suffered from excessive thirst over the past few months.  Also has had increased urinary frequency and urgency.  No pain with urination, no abdominal pain, no nausea or vomiting.  No fevers.  Patient has had episodes of confusion, seem to occur at night and in the mornings.  Had bad confusion episode while she was hospitalized for recent back surgery.  Since hospitalization her confusion seems to have overall improved.  Does not presently feel confused.  Does not have associated speech or vision change.  No numbness or weakness.  Patient also has been suffering from generalized weakness over the past many months.  No focal weakness, no significant change today.  Has appointment with primary care doctor on Monday.  Has appointment with a neurologist for new consult in October.  HPI     Past Medical History:  Diagnosis Date   ALLERGIC RHINITIS 05/04/2010   Qualifier: Diagnosis of  By: Jimmye Norman, LPN, Winfield Cunas    Allergy    Arthritis    Asthma, exercise induced 08/18/2010    She uses the Proventil inhaler on rare occasions not on a daily basis    Chest tightness or pressure 05/13/2013   COLONIC POLYPS 05/04/2010   Qualifier: Diagnosis of  By: Jimmye Norman, LPN, Winfield Cunas    Cystocele 10/02/2014   Depression    mood disorder listed after off HRT   DIVERTICULITIS, HX OF 05/04/2010   Qualifier: Diagnosis of  By: Jimmye Norman LPN, Winfield Cunas    Essential hypertension 05/04/2010   Qualifier: Diagnosis of  By: Jimmye Norman, LPN, Winfield Cunas    GERD 05/04/2010   Qualifier: Diagnosis of  By: Jimmye Norman, LPN, Winfield Cunas    History of  diverticulitis of colon    HYPERLIPIDEMIA 05/04/2010   Qualifier: Diagnosis of  By: Jimmye Norman, LPN, Stevie Kern M    Hypertension    Hypothyroidism 05/04/2010   Qualifier: Diagnosis of  By: Jimmye Norman, LPN, Winfield Cunas    Menopausal disorder 08/18/2010   When she went off the HRT she had severe mood disorder    Mitral valve prolapse    Neuropathy    Osteoarthritis 05/04/2010   Qualifier: Diagnosis of  By: Jimmye Norman, LPN, Stevie Kern M    Palpitations 06/30/2014   Pneumonia    Spinal stenosis    Thyroid disease     Patient Active Problem List   Diagnosis Date Noted   S/P lumbar fusion 01/27/2021   Chronic diarrhea 12/30/2020   Burning sensation of feet 06/21/2020   Cystocele 10/02/2014   Palpitations 06/30/2014   Asthma, exercise induced 08/18/2010   COLONIC POLYPS 05/04/2010   Hypothyroidism 05/04/2010   Hyperlipidemia, unspecified 05/04/2010   Essential hypertension 05/04/2010   ALLERGIC RHINITIS 05/04/2010   GERD 05/04/2010   Osteoarthritis 05/04/2010   DIVERTICULITIS, HX OF 05/04/2010    Past Surgical History:  Procedure Laterality Date   ABDOMINAL HYSTERECTOMY     BACK SURGERY     CATARACT EXTRACTION Bilateral    cataract surgery     COLONOSCOPY     EYE SURGERY     LAMINECTOMY WITH POSTERIOR LATERAL ARTHRODESIS LEVEL 1 N/A  01/27/2021   Procedure: Laminectomy and Foraminotomy - Lumbar Three-Four/Lumbar Four-Five, posterior fusion with fixation Lumbar Four-Five;  Surgeon: Eustace Moore, MD;  Location: [facility];  Service: Neurosurgery;  Laterality: N/A;   SHOULDER SURGERY Right    TONSILLECTOMY     trigger thumb Right    TUBAL LIGATION     WISDOM TOOTH EXTRACTION     x4     OB History   No obstetric history on file.     Family History  Problem Relation Age of Onset   Ovarian cancer Mother        in 23s   Heart disease Father        died at 64   Stroke Father    Allergies Father    Arthritis Father    Cancer Sister        brain. 29 died   Healthy Sister    Breast  cancer Maternal Aunt    Other Brother        died at pneumonia young   Colon cancer Neg Hx     Social History   Tobacco Use   Smoking status: Never   Smokeless tobacco: Never  Vaping Use   Vaping Use: Never used  Substance Use Topics   Alcohol use: Yes    Comment: 1-2 per night. consider eliminating with memory issues.   Drug use: No    Home Medications Prior to Admission medications   Medication Sig Start Date End Date Taking? Authorizing Provider  albuterol (VENTOLIN HFA) 108 (90 Base) MCG/ACT inhaler Inhale 2 puffs into the lungs every 6 (six) hours as needed for wheezing or shortness of breath. 01/08/21   Marin Olp, MD  amLODipine (NORVASC) 5 MG tablet TAKE ONE TABLET BY MOUTH DAILY; MAKE DOCTOR BURCHETTE'S APPOINTMENT FOR REFILLS PLEASE ! 02/08/21   Eulas Post, MD  azelastine (ASTELIN) 0.1 % nasal spray Place 1 spray into both nostrils 2 (two) times daily as needed for rhinitis. Use in each nostril as directed    [provider]  CALCIUM PO Take 1 capsule by mouth daily.    [provider]  Cholecalciferol (VITAMIN D-3) 25 MCG (1000 UT) CAPS Take 1,000 Units by mouth daily.    [provider]  EPINEPHrine 0.3 mg/0.3 mL IJ SOAJ injection Inject 0.3 mg into the muscle as needed for anaphylaxis.     [provider]  gabapentin (NEURONTIN) 100 MG capsule Take 200 mg by mouth at bedtime.    [provider]  levothyroxine (SYNTHROID) 25 MCG tablet TAKE ONE TABLET BY MOUTH DAILY BEFORE BREAKFAST; MAKE DOCTOR BURCHETTE'S APPOINTMENT FOR FURTHER REFILLS ! 02/08/21   Burchette, Alinda Sierras, MD  metoprolol tartrate (LOPRESSOR) 25 MG tablet TAKE TWO TABLETS BY MOUTH EVERY MORNING AND 1 TABLET EVERY EVENING; MAKE DOCTOR'S APPOINTMENT FOR FURTHER REFILLS PLEASE ! 02/08/21   Eulas Post, MD  OVER THE COUNTER MEDICATION Apply 1 application topically at bedtime. Topricin foot cream    [provider]  pantoprazole (PROTONIX) 40  MG tablet TAKE ONE TABLET BY MOUTH DAILY Patient taking differently: Take 20 mg by mouth daily. 10/28/20   Fay Records, MD  Probiotic Product (ALIGN) 4 MG CAPS Take 4 mg by mouth daily.    [provider]  traMADol (ULTRAM) 50 MG tablet Take 1 tablet (50 mg total) by mouth every 6 (six) hours as needed for moderate pain. 01/28/21   Eustace Moore, MD  vitamin B-12 (CYANOCOBALAMIN) 500 MCG tablet  Take 500 mcg by mouth 2 (two) times daily.    [provider]    Allergies    Other, Peanut-containing drug products, Pecan nut (diagnostic), and Penicillins  Review of Systems   Review of Systems  Constitutional:  Negative for chills and fever.  HENT:  Negative for ear pain and sore throat.   Eyes:  Negative for pain and visual disturbance.  Respiratory:  Negative for cough and shortness of breath.   Cardiovascular:  Negative for chest pain and palpitations.  Gastrointestinal:  Negative for abdominal pain and vomiting.  Genitourinary:  Positive for frequency. Negative for dysuria and hematuria.  Musculoskeletal:  Negative for arthralgias and back pain.  Skin:  Negative for color change and rash.  Neurological:  Positive for weakness. Negative for seizures and syncope.  Psychiatric/Behavioral:  Positive for confusion.   All other systems reviewed and are negative.  Physical Exam Updated Vital Signs BP (!) 137/59   Pulse 86   Temp 98.4 F (36.9 C)   Resp (!) 25   Ht '5\' 4"'$  (1.626 m)   Wt 70.3 kg   SpO2 97%   BMI 26.61 kg/m   Physical Exam Vitals and nursing note reviewed.  Constitutional:      General: She is not in acute distress.    Appearance: She is well-developed.  HENT:     Head: Normocephalic and atraumatic.  Eyes:     Conjunctiva/sclera: Conjunctivae normal.  Cardiovascular:     Rate and Rhythm: Normal rate and regular rhythm.     Heart sounds: No murmur heard. Pulmonary:     Effort: Pulmonary effort is normal. No respiratory distress.     Breath  sounds: Normal breath sounds.  Abdominal:     Palpations: Abdomen is soft.     Tenderness: There is no abdominal tenderness.  Musculoskeletal:        General: No deformity or signs of injury.     Cervical back: Neck supple.  Skin:    General: Skin is warm and dry.  Neurological:     Mental Status: She is alert.     Comments: AAOx3 CN 2-12 intact, speech clear visual fields intact 5/5 strength in b/l UE and LE Sensation to light touch intact in b/l UE and LE Normal FNF  Psychiatric:        Mood and Affect: Mood normal.    ED Results / Procedures / Treatments   Labs (all labs ordered are listed, but only abnormal results are displayed) Labs Reviewed  CBC WITH DIFFERENTIAL/PLATELET - Abnormal; Notable for the following components:      Result Value   WBC 13.9 (*)    RBC 3.64 (*)    Hemoglobin 11.2 (*)    HCT 32.9 (*)    Platelets 430 (*)    Neutro Abs 11.5 (*)    Monocytes Absolute 1.1 (*)    Abs Immature Granulocytes 0.14 (*)    All other components within normal limits  COMPREHENSIVE METABOLIC PANEL - Abnormal; Notable for the following components:   Sodium 130 (*)    Chloride 97 (*)    CO2 21 (*)    Glucose, Bld 129 (*)    Creatinine, Ser 1.17 (*)    GFR, Estimated 45 (*)    All other components within normal limits  URINALYSIS, ROUTINE W REFLEX MICROSCOPIC - Abnormal; Notable for the following components:   APPearance HAZY (*)    Specific Gravity, Urine <1.005 (*)    Hgb urine dipstick TRACE (*)  Leukocytes,Ua LARGE (*)    Bacteria, UA MANY (*)    All other components within normal limits  CBG MONITORING, ED - Abnormal; Notable for the following components:   Glucose-Capillary 115 (*)    All other components within normal limits  RESP PANEL BY RT-PCR (FLU A&B, COVID) ARPGX2  TSH  HEMOGLOBIN A1C    EKG EKG Interpretation  Date/Time:  Friday February 12 2021 08:50:55 EDT Ventricular Rate:  93 PR Interval:  232 QRS Duration: 80 QT Interval:  356 QTC  Calculation: 442 R Axis:   6 Text Interpretation: Sinus rhythm with sinus arrhythmia with 1st degree A-V block Anterior infarct , age undetermined Abnormal ECG Confirmed by Madalyn Rob [phone]) on 02/12/2021 11:02:39 AM  Radiology DG Chest 2 View  Result Date: 02/12/2021 CLINICAL DATA:  Weakness, leukocytosis, concern for pneumonia EXAM: CHEST - 2 VIEW COMPARISON:  12/18/2020 FINDINGS: No confluent airspace opacities or effusions. Heart is normal size. No acute bony abnormality. IMPRESSION: No active cardiopulmonary disease. Electronically Signed   By: Rolm Baptise M.D.   On: 02/12/2021 10:36   CT HEAD WO CONTRAST (5MM)  Result Date: 02/12/2021 CLINICAL DATA:  [facility] EXAM: CT HEAD WITHOUT CONTRAST TECHNIQUE: Contiguous axial images were obtained from the base of the skull through the vertex without intravenous contrast. COMPARISON:  03/25/2020 FINDINGS: Brain: There is atrophy and chronic small vessel disease changes. No acute intracranial abnormality. Specifically, no hemorrhage, hydrocephalus, mass lesion, acute infarction, or significant intracranial injury. Vascular: No hyperdense vessel or unexpected calcification. Skull: No acute calvarial abnormality. Sinuses/Orbits: No acute findings Other: None IMPRESSION: Atrophy, chronic microvascular disease. No acute intracranial abnormality. Electronically Signed   By: Rolm Baptise M.D.   On: 02/12/2021 10:41    Procedures Procedures   Medications Ordered in ED Medications  sodium chloride 0.9 % bolus 500 mL (500 mLs Intravenous New Bag/Given 02/12/21 1008)    ED Course  I have reviewed the triage vital signs and the nursing notes.  Pertinent labs & imaging results that were available during my care of the patient were reviewed by me and considered in my medical decision making (see chart for details).    MDM Rules/Calculators/A&P                           87 year old lady presented to ER with multiple concerns including excessive  thirst, generalized weakness, episodes of confusion.  Currently patient appears well in no distress with stable vital signs.  Nonfocal neurologic exam.  CT head negative for acute process.  Given normal neuro exam and negative head CT and chronicity of findings, low suspicion for acute stroke.  Basic labs noted for mild leukocytosis.  Also noted mild hyponatremia.  Chest x-ray negative for pneumonia.  UA with likely UTI.  Will treat for UTI.  Recommend patient follow-up with her primary doctor and neurology as previously scheduled.  Recommended repeat labs next week with PCP.'    After the discussed management above, the patient was determined to be safe for discharge.  The patient was in agreement with this plan and all questions regarding their care were answered.  ED return precautions were discussed and the patient will return to the ED with any significant worsening of condition.    Final Clinical Impression(s) / ED Diagnoses Final diagnoses:  None    Rx / DC Orders ED Discharge Orders     None        Lucrezia Starch, MD 02/12/21  1211  

## 2021-02-12 NOTE — Discharge Instructions (Signed)
Follow-up with your primary care doctor and neurology as discussed.  Recommend getting repeat labs next week to monitor your sodium level.  Recommend taking antibiotic for suspected urinary tract infection.  If you develop worsening confusion, fever, or other new concerning symptom, come back to ER for reassessment.

## 2021-02-12 NOTE — ED Notes (Signed)
Patient transported to CT 

## 2021-02-12 NOTE — ED Triage Notes (Signed)
Pt presents with weakness, confusion and extreme thirst for about a month. Pt had a near syncopal episode last night. Wakes at night with extreme thirst.

## 2021-02-12 NOTE — ED Notes (Signed)
20g placed in the [facility] and labs obtained and sent to lab. No complications with line placement.

## 2021-02-12 NOTE — Telephone Encounter (Signed)
I spoke with the pt's daughter to address concerns. She states that they are currently at the ED, and they have drawn labs on her. She no longer needs any labwork done. She also has an appointment scheduled for 02/15/2021 with Alyssa.

## 2021-02-15 ENCOUNTER — Ambulatory Visit: Payer: Medicare Other | Admitting: Physician Assistant

## 2021-02-15 DIAGNOSIS — M5459 Other low back pain: Secondary | ICD-10-CM | POA: Diagnosis not present

## 2021-02-15 DIAGNOSIS — M6281 Muscle weakness (generalized): Secondary | ICD-10-CM | POA: Diagnosis not present

## 2021-02-15 DIAGNOSIS — R2681 Unsteadiness on feet: Secondary | ICD-10-CM | POA: Diagnosis not present

## 2021-02-15 DIAGNOSIS — Z4789 Encounter for other orthopedic aftercare: Secondary | ICD-10-CM | POA: Diagnosis not present

## 2021-02-15 DIAGNOSIS — N3941 Urge incontinence: Secondary | ICD-10-CM | POA: Diagnosis not present

## 2021-02-16 DIAGNOSIS — N3941 Urge incontinence: Secondary | ICD-10-CM | POA: Diagnosis not present

## 2021-02-16 DIAGNOSIS — R2681 Unsteadiness on feet: Secondary | ICD-10-CM | POA: Diagnosis not present

## 2021-02-16 DIAGNOSIS — M6281 Muscle weakness (generalized): Secondary | ICD-10-CM | POA: Diagnosis not present

## 2021-02-16 DIAGNOSIS — Z4789 Encounter for other orthopedic aftercare: Secondary | ICD-10-CM | POA: Diagnosis not present

## 2021-02-16 DIAGNOSIS — M5459 Other low back pain: Secondary | ICD-10-CM | POA: Diagnosis not present

## 2021-02-17 DIAGNOSIS — R2681 Unsteadiness on feet: Secondary | ICD-10-CM | POA: Diagnosis not present

## 2021-02-17 DIAGNOSIS — M6281 Muscle weakness (generalized): Secondary | ICD-10-CM | POA: Diagnosis not present

## 2021-02-17 DIAGNOSIS — Z20828 Contact with and (suspected) exposure to other viral communicable diseases: Secondary | ICD-10-CM | POA: Diagnosis not present

## 2021-02-17 DIAGNOSIS — Z4789 Encounter for other orthopedic aftercare: Secondary | ICD-10-CM | POA: Diagnosis not present

## 2021-02-17 DIAGNOSIS — M5459 Other low back pain: Secondary | ICD-10-CM | POA: Diagnosis not present

## 2021-02-17 DIAGNOSIS — N3941 Urge incontinence: Secondary | ICD-10-CM | POA: Diagnosis not present

## 2021-02-18 ENCOUNTER — Other Ambulatory Visit: Payer: Self-pay | Admitting: Internal Medicine

## 2021-02-18 ENCOUNTER — Other Ambulatory Visit: Payer: Self-pay

## 2021-02-18 ENCOUNTER — Other Ambulatory Visit: Payer: Self-pay | Admitting: Family Medicine

## 2021-02-18 ENCOUNTER — Ambulatory Visit (INDEPENDENT_AMBULATORY_CARE_PROVIDER_SITE_OTHER): Payer: Medicare Other | Admitting: Physician Assistant

## 2021-02-18 VITALS — BP 133/69 | HR 85 | Temp 98.0°F | Ht 64.0 in | Wt 132.2 lb

## 2021-02-18 DIAGNOSIS — E871 Hypo-osmolality and hyponatremia: Secondary | ICD-10-CM | POA: Diagnosis not present

## 2021-02-18 DIAGNOSIS — M5459 Other low back pain: Secondary | ICD-10-CM | POA: Diagnosis not present

## 2021-02-18 DIAGNOSIS — R2681 Unsteadiness on feet: Secondary | ICD-10-CM | POA: Diagnosis not present

## 2021-02-18 DIAGNOSIS — N3000 Acute cystitis without hematuria: Secondary | ICD-10-CM

## 2021-02-18 DIAGNOSIS — G3184 Mild cognitive impairment, so stated: Secondary | ICD-10-CM

## 2021-02-18 DIAGNOSIS — N3941 Urge incontinence: Secondary | ICD-10-CM | POA: Diagnosis not present

## 2021-02-18 DIAGNOSIS — D649 Anemia, unspecified: Secondary | ICD-10-CM | POA: Diagnosis not present

## 2021-02-18 DIAGNOSIS — M6281 Muscle weakness (generalized): Secondary | ICD-10-CM | POA: Diagnosis not present

## 2021-02-18 DIAGNOSIS — Z4789 Encounter for other orthopedic aftercare: Secondary | ICD-10-CM | POA: Diagnosis not present

## 2021-02-18 LAB — CBC WITH DIFFERENTIAL/PLATELET
Basophils Absolute: 0 10*3/uL (ref 0.0–0.1)
Basophils Relative: 0.5 % (ref 0.0–3.0)
Eosinophils Absolute: 0.2 10*3/uL (ref 0.0–0.7)
Eosinophils Relative: 2.8 % (ref 0.0–5.0)
HCT: 33.9 % — ABNORMAL LOW (ref 36.0–46.0)
Hemoglobin: 11.5 g/dL — ABNORMAL LOW (ref 12.0–15.0)
Lymphocytes Relative: 19.3 % (ref 12.0–46.0)
Lymphs Abs: 1.7 10*3/uL (ref 0.7–4.0)
MCHC: 34 g/dL (ref 30.0–36.0)
MCV: 91.9 fl (ref 78.0–100.0)
Monocytes Absolute: 0.6 10*3/uL (ref 0.1–1.0)
Monocytes Relative: 6.7 % (ref 3.0–12.0)
Neutro Abs: 6.1 10*3/uL (ref 1.4–7.7)
Neutrophils Relative %: 70.7 % (ref 43.0–77.0)
Platelets: 551 10*3/uL — ABNORMAL HIGH (ref 150.0–400.0)
RBC: 3.68 Mil/uL — ABNORMAL LOW (ref 3.87–5.11)
RDW: 13.8 % (ref 11.5–15.5)
WBC: 8.7 10*3/uL (ref 4.0–10.5)

## 2021-02-18 LAB — COMPREHENSIVE METABOLIC PANEL
ALT: 24 U/L (ref 0–35)
AST: 20 U/L (ref 0–37)
Albumin: 3.5 g/dL (ref 3.5–5.2)
Alkaline Phosphatase: 82 U/L (ref 39–117)
BUN: 24 mg/dL — ABNORMAL HIGH (ref 6–23)
CO2: 24 mEq/L (ref 19–32)
Calcium: 9.3 mg/dL (ref 8.4–10.5)
Chloride: 104 mEq/L (ref 96–112)
Creatinine, Ser: 1.2 mg/dL (ref 0.40–1.20)
GFR: 40.26 mL/min — ABNORMAL LOW (ref >60.00)
Glucose, Bld: 105 mg/dL — ABNORMAL HIGH (ref 70–99)
Potassium: 4.1 mEq/L (ref 3.5–5.1)
Sodium: 136 mEq/L (ref 135–145)
Total Bilirubin: 0.2 mg/dL (ref 0.2–1.2)
Total Protein: 6.8 g/dL (ref 6.0–8.3)

## 2021-02-18 NOTE — Patient Instructions (Signed)
Good to meet you today! Please go to the lab for blood work and I will send results through [facility]. I'm glad you are feeling better. Please call if any other concerns come up.

## 2021-02-18 NOTE — Progress Notes (Signed)
Subjective:    Patient ID: Sara Medina, female    DOB: 04/27/1930, 87 y.o.   MRN: [id number]  Chief Complaint  Patient presents with   Follow-up    HPI Patient is in today for ED f/up from 02/12/21. She is here with her daughter, Juliann Pulse.  She was having excessive thirst and some confusion, which prompted family to take her to [facility] ED. She had had back surgery 3 weeks prior. Diagnosed with UTI. Finished antibiotics yesterday. Confusion has cleared and they feel like she is back to baseline. Thirst is back to normal. Denies any pain. States her back feels great since surgery and she wants to play pickleball. No other symptoms or concerns today.   Past Medical History:  Diagnosis Date   ALLERGIC RHINITIS 05/04/2010   Qualifier: Diagnosis of  By: Jimmye Norman, LPN, Winfield Cunas    Allergy    Arthritis    Asthma, exercise induced 08/18/2010    She uses the Proventil inhaler on rare occasions not on a daily basis    Chest tightness or pressure 05/13/2013   COLONIC POLYPS 05/04/2010   Qualifier: Diagnosis of  By: Jimmye Norman, LPN, Winfield Cunas    Cystocele 10/02/2014   Depression    mood disorder listed after off HRT   DIVERTICULITIS, HX OF 05/04/2010   Qualifier: Diagnosis of  By: Jimmye Norman LPN, Winfield Cunas    Essential hypertension 05/04/2010   Qualifier: Diagnosis of  By: Jimmye Norman, LPN, Winfield Cunas    GERD 05/04/2010   Qualifier: Diagnosis of  By: Jimmye Norman LPN, Winfield Cunas    History of diverticulitis of colon    HYPERLIPIDEMIA 05/04/2010   Qualifier: Diagnosis of  By: Jimmye Norman, LPN, Winfield Cunas    Hypertension    Hypothyroidism 05/04/2010   Qualifier: Diagnosis of  By: Jimmye Norman, LPN, Winfield Cunas    Menopausal disorder 08/18/2010   When she went off the HRT she had severe mood disorder    Mitral valve prolapse    Neuropathy    Osteoarthritis 05/04/2010   Qualifier: Diagnosis of  By: Jimmye Norman, LPN, Winfield Cunas    Palpitations 06/30/2014   Pneumonia    Spinal stenosis    Thyroid disease      Past Surgical History:  Procedure Laterality Date   ABDOMINAL HYSTERECTOMY     BACK SURGERY     CATARACT EXTRACTION Bilateral    cataract surgery     COLONOSCOPY     EYE SURGERY     LAMINECTOMY WITH POSTERIOR LATERAL ARTHRODESIS LEVEL 1 N/A 01/27/2021   Procedure: Laminectomy and Foraminotomy - Lumbar Three-Four/Lumbar Four-Five, posterior fusion with fixation Lumbar Four-Five;  Surgeon: Eustace Moore, MD;  Location: [facility];  Service: Neurosurgery;  Laterality: N/A;   SHOULDER SURGERY Right    TONSILLECTOMY     trigger thumb Right    TUBAL LIGATION     WISDOM TOOTH EXTRACTION     x4    Family History  Problem Relation Age of Onset   Ovarian cancer Mother        in 39s   Heart disease Father        died at 69   Stroke Father    Allergies Father    Arthritis Father    Cancer Sister        brain. 62 died   Healthy Sister    Breast cancer Maternal Aunt    Other Brother        died at pneumonia young   Colon cancer  Neg Hx     Social History   Tobacco Use   Smoking status: Never   Smokeless tobacco: Never  Vaping Use   Vaping Use: Never used  Substance Use Topics   Alcohol use: Yes    Comment: 1-2 per night. consider eliminating with memory issues.   Drug use: No     Allergies  Allergen Reactions   Other Anaphylaxis, Shortness Of Breath, Swelling and Rash    ALL NUTS!!!!   Peanut-Containing Drug Products Anaphylaxis, Shortness Of Breath, Swelling and Rash   Pecan Nut (Diagnostic) Anaphylaxis, Shortness Of Breath and Rash   Penicillins Swelling    Full facial swelling Did it involve swelling of the face/tongue/throat, SOB, or low BP? Yes Did it involve sudden or severe rash/hives, skin peeling, or any reaction on the inside of your mouth or nose? Yes Did you need to seek medical attention at a hospital or doctor's office? No When did it last happen? "many years ago" If all above answers are "NO", may proceed with cephalosporin use.     Review of  Systems REFER TO HPI FOR PERTINENT POSITIVES AND NEGATIVES      Objective:     BP 133/69   Pulse 85   Temp 98 F (36.7 C)   Ht 5\' 4"  (1.626 m)   Wt 132 lb 3.2 oz (60 kg)   SpO2 99%   BMI 22.69 kg/m   Wt Readings from Last 3 Encounters:  02/18/21 132 lb 3.2 oz (60 kg)  02/12/21 155 lb (70.3 kg)  01/27/21 142 lb (64.4 kg)    BP Readings from Last 3 Encounters:  02/18/21 133/69  02/12/21 126/60  01/28/21 (!) 152/62     Physical Exam Vitals and nursing note reviewed.  Constitutional:      General: She is not in acute distress.    Appearance: Normal appearance. She is not ill-appearing.  HENT:     Head: Normocephalic and atraumatic.  Cardiovascular:     Rate and Rhythm: Normal rate and regular rhythm.     Pulses: Normal pulses.     Heart sounds: Normal heart sounds.  Pulmonary:     Effort: Pulmonary effort is normal.     Breath sounds: Normal breath sounds.  Abdominal:     General: Abdomen is flat. Bowel sounds are normal.     Palpations: Abdomen is soft.     Tenderness: There is no right CVA tenderness or left CVA tenderness.  Skin:    General: Skin is warm and dry.  Neurological:     General: No focal deficit present.     Mental Status: She is alert.  Psychiatric:        Mood and Affect: Mood normal.       Assessment & Plan:   Problem List Items Addressed This Visit   None   1. Acute cystitis without hematuria 2. MCI (mild cognitive impairment) with memory loss 3. Low hemoglobin 4. Hyponatremia I personally reviewed emergency department report from 02/12/2021 and labs and imaging results with patient and her daughter today.  CT of her head was negative for any acute changes and chest x-ray was also negative.  Her hemoglobin A1c was 5.7.  TSH was normal.  CMP showed a sodium level of 130, glucose level of 129, estimated GFR 45.  CBC showed white blood cell count of 13.9, which is consistent with her diagnosis of urinary tract infection.  Her hemoglobin  was also low at 11.2.  Plan is to  repeat CBC, CMP, UA and UC today and call with results.  Overall she is doing better and encouraged her to continue high-protein, low sugar nutrition and good water intake, but not excessive.  They know to return to the emergency department should any other acute changes present.  They will follow-up with PCP Dr. Yong Channel as scheduled.  This note was prepared with assistance of Systems analyst. Occasional wrong-word or sound-a-like substitutions may have occurred due to the inherent limitations of voice recognition software.  Time Spent: 32 minutes of total time was spent on the date of the encounter performing the following actions: chart review prior to seeing the patient, obtaining history, performing a medically necessary exam, counseling on the treatment plan, placing orders, and documenting in our EHR.    Nisreen Guise M Shaina Gullatt, PA-C

## 2021-02-19 ENCOUNTER — Other Ambulatory Visit: Payer: Self-pay

## 2021-02-19 ENCOUNTER — Encounter: Payer: Self-pay | Admitting: Family Medicine

## 2021-02-19 ENCOUNTER — Other Ambulatory Visit: Payer: Self-pay | Admitting: Family Medicine

## 2021-02-19 ENCOUNTER — Encounter: Payer: Self-pay | Admitting: Physician Assistant

## 2021-02-19 DIAGNOSIS — R2681 Unsteadiness on feet: Secondary | ICD-10-CM | POA: Diagnosis not present

## 2021-02-19 DIAGNOSIS — Z4789 Encounter for other orthopedic aftercare: Secondary | ICD-10-CM | POA: Diagnosis not present

## 2021-02-19 DIAGNOSIS — M5459 Other low back pain: Secondary | ICD-10-CM | POA: Diagnosis not present

## 2021-02-19 DIAGNOSIS — N3941 Urge incontinence: Secondary | ICD-10-CM | POA: Diagnosis not present

## 2021-02-19 DIAGNOSIS — M6281 Muscle weakness (generalized): Secondary | ICD-10-CM | POA: Diagnosis not present

## 2021-02-19 MED ORDER — AMLODIPINE BESYLATE 5 MG PO TABS: 5.0000 mg | ORAL_TABLET | Freq: Two times a day (BID) | ORAL | 2 refills | Status: DC

## 2021-02-20 ENCOUNTER — Other Ambulatory Visit: Payer: Self-pay | Admitting: Family Medicine

## 2021-02-22 DIAGNOSIS — Z4789 Encounter for other orthopedic aftercare: Secondary | ICD-10-CM | POA: Diagnosis not present

## 2021-02-22 DIAGNOSIS — M5459 Other low back pain: Secondary | ICD-10-CM | POA: Diagnosis not present

## 2021-02-22 DIAGNOSIS — M6281 Muscle weakness (generalized): Secondary | ICD-10-CM | POA: Diagnosis not present

## 2021-02-22 DIAGNOSIS — R2681 Unsteadiness on feet: Secondary | ICD-10-CM | POA: Diagnosis not present

## 2021-02-22 DIAGNOSIS — N3941 Urge incontinence: Secondary | ICD-10-CM | POA: Diagnosis not present

## 2021-02-22 NOTE — Telephone Encounter (Signed)
May refill per protocol-please take off notes about needing office visit and can do 90 day supply with 3 refills

## 2021-02-23 ENCOUNTER — Other Ambulatory Visit: Payer: Self-pay

## 2021-02-23 DIAGNOSIS — I10 Essential (primary) hypertension: Secondary | ICD-10-CM

## 2021-02-23 DIAGNOSIS — M5459 Other low back pain: Secondary | ICD-10-CM | POA: Diagnosis not present

## 2021-02-23 DIAGNOSIS — R2681 Unsteadiness on feet: Secondary | ICD-10-CM | POA: Diagnosis not present

## 2021-02-23 DIAGNOSIS — N3941 Urge incontinence: Secondary | ICD-10-CM | POA: Diagnosis not present

## 2021-02-23 DIAGNOSIS — Z4789 Encounter for other orthopedic aftercare: Secondary | ICD-10-CM | POA: Diagnosis not present

## 2021-02-23 DIAGNOSIS — M6281 Muscle weakness (generalized): Secondary | ICD-10-CM | POA: Diagnosis not present

## 2021-02-23 MED ORDER — LEVOTHYROXINE SODIUM 25 MCG PO TABS: ORAL_TABLET | ORAL | 3 refills | Status: DC

## 2021-02-23 MED ORDER — AMLODIPINE BESYLATE 5 MG PO TABS: ORAL_TABLET | ORAL | 3 refills | Status: DC

## 2021-02-23 MED ORDER — METOPROLOL TARTRATE 25 MG PO TABS: ORAL_TABLET | ORAL | 3 refills | Status: DC

## 2021-02-24 DIAGNOSIS — Z4789 Encounter for other orthopedic aftercare: Secondary | ICD-10-CM | POA: Diagnosis not present

## 2021-02-24 DIAGNOSIS — M5459 Other low back pain: Secondary | ICD-10-CM | POA: Diagnosis not present

## 2021-02-24 DIAGNOSIS — N3941 Urge incontinence: Secondary | ICD-10-CM | POA: Diagnosis not present

## 2021-02-24 DIAGNOSIS — R2681 Unsteadiness on feet: Secondary | ICD-10-CM | POA: Diagnosis not present

## 2021-02-24 DIAGNOSIS — Z20828 Contact with and (suspected) exposure to other viral communicable diseases: Secondary | ICD-10-CM | POA: Diagnosis not present

## 2021-02-24 DIAGNOSIS — M6281 Muscle weakness (generalized): Secondary | ICD-10-CM | POA: Diagnosis not present

## 2021-02-25 DIAGNOSIS — M6281 Muscle weakness (generalized): Secondary | ICD-10-CM | POA: Diagnosis not present

## 2021-02-25 DIAGNOSIS — N3941 Urge incontinence: Secondary | ICD-10-CM | POA: Diagnosis not present

## 2021-02-25 DIAGNOSIS — Z4789 Encounter for other orthopedic aftercare: Secondary | ICD-10-CM | POA: Diagnosis not present

## 2021-02-25 DIAGNOSIS — R2681 Unsteadiness on feet: Secondary | ICD-10-CM | POA: Diagnosis not present

## 2021-02-25 DIAGNOSIS — M5459 Other low back pain: Secondary | ICD-10-CM | POA: Diagnosis not present

## 2021-03-03 DIAGNOSIS — Z8616 Personal history of COVID-19: Secondary | ICD-10-CM | POA: Diagnosis not present

## 2021-03-03 DIAGNOSIS — N3941 Urge incontinence: Secondary | ICD-10-CM | POA: Diagnosis not present

## 2021-03-03 DIAGNOSIS — M6281 Muscle weakness (generalized): Secondary | ICD-10-CM | POA: Diagnosis not present

## 2021-03-03 DIAGNOSIS — M5459 Other low back pain: Secondary | ICD-10-CM | POA: Diagnosis not present

## 2021-03-03 DIAGNOSIS — R2681 Unsteadiness on feet: Secondary | ICD-10-CM | POA: Diagnosis not present

## 2021-03-03 DIAGNOSIS — Z4789 Encounter for other orthopedic aftercare: Secondary | ICD-10-CM | POA: Diagnosis not present

## 2021-03-04 DIAGNOSIS — Z23 Encounter for immunization: Secondary | ICD-10-CM | POA: Diagnosis not present

## 2021-03-05 DIAGNOSIS — M5459 Other low back pain: Secondary | ICD-10-CM | POA: Diagnosis not present

## 2021-03-05 DIAGNOSIS — M6281 Muscle weakness (generalized): Secondary | ICD-10-CM | POA: Diagnosis not present

## 2021-03-05 DIAGNOSIS — Z4789 Encounter for other orthopedic aftercare: Secondary | ICD-10-CM | POA: Diagnosis not present

## 2021-03-05 DIAGNOSIS — N3941 Urge incontinence: Secondary | ICD-10-CM | POA: Diagnosis not present

## 2021-03-05 DIAGNOSIS — R2681 Unsteadiness on feet: Secondary | ICD-10-CM | POA: Diagnosis not present

## 2021-03-08 ENCOUNTER — Institutional Professional Consult (permissible substitution): Payer: Medicare Other | Admitting: Neurology

## 2021-03-08 ENCOUNTER — Encounter: Payer: Self-pay | Admitting: *Deleted

## 2021-03-09 ENCOUNTER — Encounter: Payer: Self-pay | Admitting: *Deleted

## 2021-03-09 ENCOUNTER — Other Ambulatory Visit: Payer: Medicare Other

## 2021-03-09 DIAGNOSIS — Z4789 Encounter for other orthopedic aftercare: Secondary | ICD-10-CM | POA: Diagnosis not present

## 2021-03-09 DIAGNOSIS — M6281 Muscle weakness (generalized): Secondary | ICD-10-CM | POA: Diagnosis not present

## 2021-03-09 DIAGNOSIS — M5459 Other low back pain: Secondary | ICD-10-CM | POA: Diagnosis not present

## 2021-03-09 DIAGNOSIS — N3941 Urge incontinence: Secondary | ICD-10-CM | POA: Diagnosis not present

## 2021-03-09 DIAGNOSIS — R2681 Unsteadiness on feet: Secondary | ICD-10-CM | POA: Diagnosis not present

## 2021-03-10 ENCOUNTER — Encounter: Payer: Self-pay | Admitting: Neurology

## 2021-03-10 ENCOUNTER — Other Ambulatory Visit: Payer: Self-pay

## 2021-03-10 ENCOUNTER — Ambulatory Visit (INDEPENDENT_AMBULATORY_CARE_PROVIDER_SITE_OTHER): Payer: Medicare Other | Admitting: Neurology

## 2021-03-10 VITALS — BP 141/69 | HR 75 | Ht 63.0 in | Wt 139.6 lb

## 2021-03-10 DIAGNOSIS — Z20828 Contact with and (suspected) exposure to other viral communicable diseases: Secondary | ICD-10-CM | POA: Diagnosis not present

## 2021-03-10 DIAGNOSIS — F0390 Unspecified dementia without behavioral disturbance: Secondary | ICD-10-CM

## 2021-03-10 DIAGNOSIS — R413 Other amnesia: Secondary | ICD-10-CM | POA: Diagnosis not present

## 2021-03-10 DIAGNOSIS — G309 Alzheimer's disease, unspecified: Secondary | ICD-10-CM | POA: Diagnosis not present

## 2021-03-10 DIAGNOSIS — G6289 Other specified polyneuropathies: Secondary | ICD-10-CM | POA: Diagnosis not present

## 2021-03-10 MED ORDER — DONEPEZIL HCL 5 MG PO TABS: 5.0000 mg | ORAL_TABLET | Freq: Every day | ORAL | 6 refills | Status: DC

## 2021-03-10 MED ORDER — GABAPENTIN 100 MG PO CAPS: 400.0000 mg | ORAL_CAPSULE | Freq: Every day | ORAL | 11 refills | Status: DC

## 2021-03-10 NOTE — Progress Notes (Signed)
[facility]    Provider:  Dr Jaynee Eagles Requesting Provider: Suella Broad, MD Primary Care Provider:  Marin Olp, MD  CC:  memory loss, neuropathy  5 years ago she felt like she had a problem with playing cards and keeping score. She played it once a week. Here with daughter who provides much information. First thing she noticed, still to this day struggles with dates and times are hard for her to keep straight. She feeds the cat every morning, 3 months ago she got up and couldn't remember how to feed the cat. She has to concentrate to do things that involve multiple steps. Dauhter notices quite a few things, she is forgetting things. She has been having back issues and had minor invasive back surgery. She had problems with the anesthesia for 3 weeks. Then she had a UTI. She is repeating things. She is feeling better. She has a monitor in case she falls. She is having trouble using the phone, she wanted to go to happy hour and wanted to bring the landline phone with her. She got her cell and her land line mixed up but that was in the UTI phase.  Had an extended visit today with patient, discussed dementia, different kinds of dementia, versus mild cognitive impairment also the role that depression and anxiety or lifestyle and genetics, medication, management, microvascular ischemia and vascular dementia and answered all questions. No other focal neurologic deficits, associated symptoms, inciting events or modifiable factors.  CT head 02/12/2021: personally reviewed images and with patient and daughter  IMPRESSION: Atrophy, chronic microvascular disease.   No acute intracranial abnormality.    HPI:  Sara Medina is a 87 y.o. female here as requested by  Suella Broad, MD for numbness of lower limb.  Past medical history severe spinal stenosis L3-L4 and L4-L5, spondylolisthesis L4-L5, bilateral burning in the feet possibly peripheral neuropathy for which she has been requested  to be seen here in neurology.  She also has a history of cardiac arrhythmia, hypertension, asthma, GERD, hypothyroidism, osteoarthritis, hyperlipidemia.  She is here with her daughter who also provides much information. 25 years ago she noticed the symptoms slowly the symptoms have crept up, within the last 2-3 months in bed she is gettng cramps and feet feel on fire. They burn. Slowly progressively worsening. The Gabapentin helps a lot. Symmetrical, it hurts severely, has cramps, Gabapentin helps, Topricin helped. 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 Cramps in the toes. Burning on the bottom on the feet from the toes to the heels. No weakness. No gaot abnormalities. No other focal neurologic deficits, associated symptoms, inciting events or modifiable factors.She is extremely organized and she takes her own medications, daughters help. We discussed cameras and she doesn't love that, they all call and text. She has a calendar or she would forget about appointments. She doesn't use a scooter anymore. She is having trouble with numbers, she doesn't cook, she doesn't bake. When she was on the anesthesias she had elaborate hallucinations which she remembers in detail today. Father had dementia in his 19s, after he had strokes. Mother was sharp until her 42s.   MRI of the brain    I reviewed  Suella Broad, MD's notes: She has a past medical history of degeneration of the lumbar intervertebral discs, spinal stenosis, postoperative care, pain in the right hip joint, bilateral carpal tunnel, low back pain.  She saw Dr. Herma Mering for bilateral leg weakness and  burning in the bilateral feet the feet issue is the worst, she was started on gabapentin, and its helping, still with some low back pain, she has more than 1 problem including a crunching sensation in her neck but denies any radicular symptoms into her arms, 2% over-the-counter cream helped for about a month and it stopped working,  gabapentin is giving her relief, she injured her head and went to the emergency room not too long ago they did not diagnose her with any because of that, occurred prior to taking the gabapentin, she is complaining of burning sensation in her feet, she is very active but the pain in her feet is not helping her ability to do things, she would rather walk in her carpet at home rather than walking out in the community on a hard concrete, her daughter bought her a walker, she has not had any more recent falls, she did get a bruise over her right orbital region since she fell, negative straight leg raise on the right, negative cross her leg raise on the left, she has pretty good cervical range of motion, strength is 5 out of 5, she does have severe spinal stenosis L3-L4 and L4-L5 and spondylolisthesis L4-L5, here for evaluation of peripheral neuropathy.  She also had an L5-S1 epidural steroid injection, she had good relief for 1 day, pain is in the back of her legs but also in the front of her legs and groin.  It appears she has had 3 L5-S1 epidural steroid injections.  Reviewed notes, labs and imaging from outside physicians, which showed:  MRI of the lumbar spine December 17, 2018 shows severe L3-L4 spinal canal stenosis, moderate to severe narrowing, moderate L4-L5 spinal canal stenosis and moderate to severe narrowing, moderate to severe right and mild left L5-S1 neural foraminal narrowing with mass-effect upon the right L5 nerve root, severe spinal stenosis at L3-L4 and L4-L5 with spondylosis at L4-L5. Reviewed images.  Review of Systems: Patient complains of symptoms per HPI as well as the following symptoms: memory loss . Pertinent negatives and positives per HPI. All others negative     Social History   Socioeconomic History   Marital status: Widowed    Spouse name: Not on file   Number of children: 3   Years of education: Not on file   Highest education level: Not on file  Occupational History    Occupation: retired    Fish farm manager: RETIRED  Tobacco Use   Smoking status: Never   Smokeless tobacco: Never  Vaping Use   Vaping Use: Never used  Substance and Sexual Activity   Alcohol use: Yes    Alcohol/week: 5.0 standard drinks    Types: 5 Glasses of wine per week    Comment: 1-2 per night. consider eliminating with memory issues.   Drug use: No   Sexual activity: Not Currently  Other Topics Concern   Not on file  Social History Narrative   Widowed around 2013. Lives alone with cat at [place]. Has caregiver in the morning.    3 children- 2 daughters live close. 5 grandkids. 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 Was great athlete.       Hobbies: enjoys tv, wine         Social Determinants of Radio broadcast assistant Strain: Not on file  Food Insecurity: Not on file  Transportation Needs: Not on file  Physical Activity: Not on file  Stress: Not on file  Social Connections: Not on file  Intimate Partner Violence: Not on file    Family History  Problem Relation Age of Onset   Ovarian cancer Mother        in 65s   Heart disease Father        died at 30   Stroke Father    Allergies Father    Arthritis Father    Dementia Father    Cancer Sister        brain. 16 died   Healthy Sister    Other Brother        died at pneumonia young   Breast cancer Maternal Aunt    Colon cancer Neg Hx     Past Medical History:  Diagnosis Date   ALLERGIC RHINITIS 05/04/2010   Qualifier: Diagnosis of  By: Jimmye Norman, LPN, [place] Cunas    Allergy    Arthritis    Asthma, exercise induced 08/18/2010    She uses the Proventil inhaler on rare occasions not on a daily basis    Chest tightness or pressure 05/13/2013   COLONIC POLYPS 05/04/2010   Qualifier: Diagnosis of  By: Jimmye Norman, LPN, [place] Cunas    Cystocele 10/02/2014   Depression    mood disorder listed after off HRT   DIVERTICULITIS, HX OF 05/04/2010   Qualifier:  Diagnosis of  By: Jimmye Norman LPN, [place] Cunas    Essential hypertension 05/04/2010   Qualifier: Diagnosis of  By: Jimmye Norman, LPN, [place] Cunas    GERD 05/04/2010   Qualifier: Diagnosis of  By: Jimmye Norman, LPN, [place] Cunas    History of diverticulitis of colon    HYPERLIPIDEMIA 05/04/2010   Qualifier: Diagnosis of  By: Jimmye Norman, LPN, Stevie Kern M    Hypertension    Hypothyroidism 05/04/2010   Qualifier: Diagnosis of  By: Jimmye Norman, LPN, [place] Cunas    Menopausal disorder 08/18/2010   When she went off the HRT she had severe mood disorder    Mitral valve prolapse    Neuropathy    Osteoarthritis 05/04/2010   Qualifier: Diagnosis of  By: Jimmye Norman, LPN, Stevie Kern M    Palpitations 06/30/2014   Pneumonia    Spinal stenosis    Thyroid disease     Patient Active Problem List   Diagnosis Date Noted   S/P lumbar fusion 01/27/2021   Chronic diarrhea 12/30/2020   Burning sensation of feet 06/21/2020   Cystocele 10/02/2014   Palpitations 06/30/2014   Asthma, exercise induced 08/18/2010   COLONIC POLYPS 05/04/2010   Hypothyroidism 05/04/2010   Hyperlipidemia, unspecified 05/04/2010   Essential hypertension 05/04/2010   ALLERGIC RHINITIS 05/04/2010   GERD 05/04/2010   Osteoarthritis 05/04/2010   DIVERTICULITIS, HX OF 05/04/2010    Past Surgical History:  Procedure Laterality Date   ABDOMINAL HYSTERECTOMY     BACK SURGERY     CATARACT EXTRACTION Bilateral    cataract surgery     COLONOSCOPY     EYE SURGERY     LAMINECTOMY WITH POSTERIOR LATERAL ARTHRODESIS LEVEL 1 N/A 01/27/2021   Procedure: Laminectomy and Foraminotomy - Lumbar Three-Four/Lumbar Four-Five, posterior fusion with fixation Lumbar Four-Five;  Surgeon: Eustace Moore, MD;  Location: [place];  Service: Neurosurgery;  Laterality: N/A;   SHOULDER SURGERY Right    TONSILLECTOMY     trigger thumb Right    TUBAL LIGATION     WISDOM TOOTH EXTRACTION     x4    Current Outpatient Medications  Medication Sig Dispense Refill   amLODipine  (NORVASC)  5 MG tablet TAKE ONE TABLET BY MOUTH DAILY 90 tablet 3   CALCIUM PO Take 1 capsule by mouth daily.     Cholecalciferol (VITAMIN D-3) 25 MCG (1000 UT) CAPS Take 1,000 Units by mouth daily.     donepezil (ARICEPT) 5 MG tablet Take 1 tablet (5 mg total) by mouth at bedtime. 30 tablet 6   EPINEPHrine 0.3 mg/0.3 mL IJ SOAJ injection Inject 0.3 mg into the muscle as needed for anaphylaxis.      levothyroxine (SYNTHROID) 25 MCG tablet TAKE ONE TABLET BY MOUTH DAILY BEFORE BREAKFAST 90 tablet 3   metoprolol tartrate (LOPRESSOR) 25 MG tablet TAKE TWO TABLETS BY MOUTH EVERY MORNING AND TAKE ONE TABLET BY MOUTH EVERY EVENING 270 tablet 3   OVER THE COUNTER MEDICATION Apply 1 application topically at bedtime. Topricin foot cream     Probiotic Product (ALIGN) 4 MG CAPS Take 4 mg by mouth daily.     vitamin B-12 (CYANOCOBALAMIN) 500 MCG tablet Take 500 mcg by mouth 2 (two) times daily.     gabapentin (NEURONTIN) 100 MG capsule Take 4 capsules (400 mg total) by mouth at bedtime. 360 capsule 11   Current Facility-Administered Medications  Medication Dose Route Frequency Provider Last Rate Last Admin   cloNIDine (CATAPRES) tablet 0.1 mg  0.1 mg Oral Once Dorothyann Peng, NP        Allergies as of 03/10/2021 - Review Complete 03/10/2021  Allergen Reaction Noted   Other Anaphylaxis, Shortness Of Breath, Swelling, and Rash 08/19/2019   Peanut-containing drug products Anaphylaxis, Shortness Of Breath, Swelling, and Rash 06/21/2011   Pecan nut (diagnostic) Anaphylaxis, Shortness Of Breath, and Rash 08/19/2019   Penicillins Swelling 05/04/2010    Vitals: BP (!) 141/69   Pulse 75   Ht 5\' 3"  (1.6 m)   Wt 139 lb 9.6 oz (63.3 kg)   BMI 24.73 kg/m  Last Weight:  Wt Readings from Last 1 Encounters:  03/10/21 139 lb 9.6 oz (63.3 kg)   Last Height:   Ht Readings from Last 1 Encounters:  03/10/21 5\' 3"  (1.6 m)  Exam: NAD, pleasant                  Speech:    Speech is normal; fluent and  spontaneous with normal comprehension.  Cognition:    MMSE - Mini Mental State Exam 03/10/2021 05/12/2017  Not completed: - (No Data)  Orientation to time 5 -  Orientation to Place 5 -  Registration 3 -  Attention/ Calculation 1 -  Recall 3 -  Language- name 2 objects 2 -  Language- repeat 0 -  Language- follow 3 step command 3 -  Language- read & follow direction 1 -  Write a sentence 1 -  Copy design 0 -  Total score 24 -      Cranial Nerves:    The pupils are equal, round, and reactive to light.Trigeminal sensation is intact and the muscles of mastication are normal. The face is symmetric. The palate elevates in the midline. Hearing intact. Voice is normal. Shoulder shrug is normal. The tongue has normal motion without fasciculations.   Coordination:  No dysmetria  Motor Observation:    No asymmetry, no atrophy, and no involuntary movements noted. Tone:    Normal muscle tone.     Strength:    Strength is V/V in the upper and lower limbs.      Sensation: intact to LT         Sensation: stable  decreased pin prick and temperature distally in the toes      Reflex Exam:  DTR's:    Trace AJs. Deep tendon reflexes in the upper and lower extremities are symmetrical bilaterally.   Toes:    The toes are equiv bilaterally.   Clonus:    Clonus is absent.    Assessment/Plan:   87 y.o. female here for memory loss In the past seen as requested by  Suella Broad, MD for numbness of lower limb now here for evaluation of memory loss from Dr. Yong Channel.  Past medical history severe spinal stenosis L3-L4 and L4-L5, spondylolisthesis L4-L5, peripheral neuropathy.MMSE 24/30. Had an extended visit today with patient, discussed dementia, different kinds of dementia, versus mild cognitive impairment also the role that depression and anxiety or lifestyle and genetics, medication, management, microvascular ischemia and vascular dementia and answered all questions.   MRI of the brain w/wo  contrast for reversible causes of dementia - will call Formal neurocognitive testing - Dr. Nicole Kindred, will call Healthy Brain study at [facility] - Consider joining The XX Brain by Eden Emms - Great book, recommend reading Start Aricept(Donepezil) 5mg  (other medication is Namenda/Memantine) for slowing down memory loss, discussed MIND diet from [employer] - Diet to help prevent Alzheimer's disease, discussed and recommended FDG PET Scan help diagnose Alzheimer's - consider in furture if needed We will call to schedule a follow up with [place] after we see when all the appointments above are scheduled Up to 4 Gabapentin at bedtime for cramping  Meds ordered this encounter  Medications   gabapentin (NEURONTIN) 100 MG capsule    Sig: Take 4 capsules (400 mg total) by mouth at bedtime.    Dispense:  360 capsule    Refill:  11   donepezil (ARICEPT) 5 MG tablet    Sig: Take 1 tablet (5 mg total) by mouth at bedtime.    Dispense:  30 tablet    Refill:  6   Orders Placed This Encounter  Procedures   MR BRAIN W WO CONTRAST   Ambulatory referral to Neuropsychology      Prior Assessment and plan 06/19/2020:  Discussed the causes of peripheral neuropathy, the most common being diabetes which patient does not report having. About 20 million people in the [place] states have some form of peripheral neuropathy. This is a condition that develops as a result of damage to the peripheral nervous system. Given symptoms and exam suspect a symmetric length dependent neuropathy probably small fiber axonal.. There are multiple causes eluding metabolic, toxic, infectious and endocrine disorders, small vessel disease, autoimmune diseases, and others. We'll perform extensive serum neuropathy screening. Continue Gabapentin which is helping tremendously. May also consider podiatry, heel pain may be plantar fasciitis or other concomitant structural problem contributing to symptoms.    Cc: Marin Olp, MD,   Marin Olp, MD,  Suella Broad, MD  Sarina Ill, MD  [facility] [address] [place], [place] [address]  Phone [phone] Fax [phone]  I spent over 85 minutes of face-to-face and non-face-to-face time with patient on the  1. Memory loss   2. Other polyneuropathy   3. Evaluate for Alzheimer's disease, unspecified (CODE) ([place])   4. Major neurocognitive disorder ([place])     diagnosis.  This included previsit chart review, lab review, study review, order entry, electronic health record documentation, patient education on the different diagnostic and therapeutic options, counseling and coordination of care, risks and benefits of management, compliance, or risk  factor reduction

## 2021-03-10 NOTE — Patient Instructions (Addendum)
MRI of the brain w/wo contrast - will call Formal neurocognitive testing - Dr. Nicole Kindred, will call Healthy Brain study at [facility] - Consider joining The XX Brain by Eden Emms - Great book Start Aricept(Donepezil) 5mg  (other medication is Namenda/Memantine) for slowing down memory loss MIND diet from [employer] - Diet to help prevent Alzheimer's disease FDG PET Scan help diagnose Alzheimer's - consider in furture if needed We will call to schedule a follow up with [place] after we see when all the appointments above are scheduled Up to 4 Gabapentin at bedtime for cramping     Recommendations to prevent or slow progression of cognitive decline:   Exercise You should increase exercise 30 to 45 minutes per day at least 3 days a week although 5 to 7 would be preferred. Any type of exercise (including walking) is acceptable although a recumbent bicycle may be best if you are unsteady. Disease related apathy can be a significant roadblock to exercise and the only way to overcome this is to make it a daily routine and perhaps have a reward at the end (something your loved one loves to eat or drink perhaps) or a personal trainer coming to the home can also be very useful. In general a structured, repetitive schedule is best.   Cardiovascular Health: You should optimize all cardiovascular risk factors (blood pressure, sugar, cholesterol) as vascular disease such as strokes and heart attacks can make memory problems much worse.   Diet: Eating a heart healthy (Mediterranean) diet is also a good idea; fish and poultry instead of red meat, nuts (mostly non-peanuts), vegetables, fruits, olive oil or canola oil (instead of butter), minimal salt (use other spices to flavor foods), whole grain rice, bread, cereal and pasta and wine in moderation.  General Health: Any diseases which effect your body will effect your brain such as a pneumonia, urinary infection, blood clot, heart attack or stroke. Keep  contact with your primary care doctor for regular follow ups.  Sleep. A good nights sleep is healthy for the brain. Seven hours is recommended. If you have insomnia or poor sleep habits see the recommendations below  Tips: Structured and consistent daytime and nighttime routine, including regular wake times, bedtimes, and mealtimes, will be important for the patient to avoid confusion. Keeping frequently used items in designated places will help reduce stress from searching. If there are worries about getting lost do not let the patient leave home unaccompanied. They might benefit from wearing an identification bracelet that will help others assist in finding home if they become lost. Information about nationwide safe return services and other helpful resources may be obtained through the Alzheimer's Association helpline at [phone].  Finances, Power of Producer, television/film/video Directives: You should consider putting legal safeguards in place with regard to financial and medical decision making. While the spouse always has power of attorney for medical and financial issues in the absence of any form, you should consider what you want in case the spouse / caregiver is no longer around or capable of making decisions.   [place] : http://www.welch.com/.pdf  Or Google "[place]" AND "An Forensic scientist for [employer]  Other States: ApartmentMom.com.ee  The signature on these forms should be notarized.   DRIVING:   Driving only during the day Drive only to familiar Locations Avoid driving during bad weather  If you would like to be tested to see if you are driving safely, [facility] has a Clinical Driving Evaluation. To schedule an appointment call  [phone].                RESOURCES:  Memory Loss: Improve your short term memory By Silvio Pate  The Alzheimer's Reading Room http://www.alzheimersreadingroom.com/   The Alzheimer's Compendium http://www.alzcompend.info/  [employer] www.dukefamilysupport.UJW ([phone]  Recommended resources for caregivers (All can be purchased on [employer]):  1) A Caregiver's Guide to Dementia: Using Activities and Other Strategies to Prevent, Reduce and Manage Behavioral Symptoms by Osie Bond. Tyler Aas and [employer]   2) A Caregiver's Guide to [employer] Dementia by Caleen Essex MS BSN and Gaston Islam   3) What If It's Not Alzheimer's?: A Caregiver's Guide to Dementia by Koren Shiver (Author), Octaviano Batty (Editor)  3) The 36 hour day by Rabins and Mace  4) Understanding Difficult Behaviors by Merita Norton and White  Online course for helping caregivers reduce stress, guilt and frustration called the Caregivers Helpbook. The website is www.powerfultoolsforcaregivers.org  As a caregiver you are a Art gallery manager. Problems you face as a caregiver are usually unique to your situation and the way your loved-one's disease manifests itself. The best way to use these books is to look at the Table of Contents and read any chapters of interest or that apply to challenges you are having as a caregiver.  NATIONAL RESOURCES: For more information on neurological disorders or research programs funded by the [employer] of Neurological Disorders and Stroke, contact the Institute's Agricultural consultant (BRAIN) at: [place] [address] [phone] (toll-free) MasterBoxes.it  Information on dementia is also available from the following organizations: Alzheimer's Disease Education and Referral ([place]) [place] on Aging [address] [place] [address] [phone] (toll-free) DVDEnthusiasts.nl  Alzheimer's Association [address], [place]  [address] [phone] (toll-free, 24-hour helpline) [phone] (TDD) CapitalMile.co.nz  Alzheimer's Foundation of America [address], [place] [address] [phone] (toll-free) www.alzfdn.org  [facility] [address], [place] [address] [phone] www.alzdiscovery.org  Association for [place] [address], [place] [address] [phone] (toll-free) www.[place] [address] (604)680-4880 (toll-free) www.brightfocus.org/alzheimers  Doran Stabler [facility] Alzheimer's Foundation [address] [place] [address] [phone] www.https://lambert-jackson.net/  Lewy Body Dementia Association [address] [place] [address] [phone] (toll-free LBD Caregiver Link) www.lbda.[place], [place], [place] [phone] (toll-free) [phone] [facility]) https://carter.com/  National Organization for Rare Disorders [address] [place] [address] 6-734-193-XTKW [phone]) (toll-free) www.rarediseases.org  The Dementias: Hope Through Research was jointly produced by the [employer] of Neurological Disorders and Stroke (NINDS) and the [employer] on Aging (NIA), both part of the [employer], the [employer] research agency--supporting scientific studies that turn discovery into health. NINDS is the nation's leading funder of research on the brain and nervous system. The NINDS mission is to reduce the burden of neurological disease. For more information and resources, visit MasterBoxes.it [1] or call 254-293-8720. NIA leads the federal government effort conducting and supporting research on aging and the health and well-being of  older people. NIA's Alzheimer's Disease Education and Referral (ADEAR) Center offers information and publications on dementia and caregiving for families, caregivers, and professionals. For more information, visit DVDEnthusiasts.nl [2] or call [phone]. Also available from NIA are publications and information  about Alzheimer's disease as well as the booklets Frontotemporal Disorders: Information for Patients, Families, and Caregivers and Lewy Body Dementia: Information for Patients, Families, and Professionals. Source URL: SocialSpecialists.co.nz

## 2021-03-11 ENCOUNTER — Other Ambulatory Visit: Payer: Medicare Other

## 2021-03-11 DIAGNOSIS — M4316 Spondylolisthesis, lumbar region: Secondary | ICD-10-CM | POA: Diagnosis not present

## 2021-03-11 DIAGNOSIS — M48062 Spinal stenosis, lumbar region with neurogenic claudication: Secondary | ICD-10-CM | POA: Diagnosis not present

## 2021-03-12 DIAGNOSIS — Z4789 Encounter for other orthopedic aftercare: Secondary | ICD-10-CM | POA: Diagnosis not present

## 2021-03-12 DIAGNOSIS — M6281 Muscle weakness (generalized): Secondary | ICD-10-CM | POA: Diagnosis not present

## 2021-03-12 DIAGNOSIS — M5459 Other low back pain: Secondary | ICD-10-CM | POA: Diagnosis not present

## 2021-03-12 DIAGNOSIS — N3941 Urge incontinence: Secondary | ICD-10-CM | POA: Diagnosis not present

## 2021-03-12 DIAGNOSIS — R2681 Unsteadiness on feet: Secondary | ICD-10-CM | POA: Diagnosis not present

## 2021-03-15 ENCOUNTER — Other Ambulatory Visit: Payer: Self-pay

## 2021-03-15 ENCOUNTER — Other Ambulatory Visit (INDEPENDENT_AMBULATORY_CARE_PROVIDER_SITE_OTHER): Payer: Medicare Other

## 2021-03-15 ENCOUNTER — Encounter: Payer: Self-pay | Admitting: Psychology

## 2021-03-15 DIAGNOSIS — I10 Essential (primary) hypertension: Secondary | ICD-10-CM | POA: Diagnosis not present

## 2021-03-15 DIAGNOSIS — M6281 Muscle weakness (generalized): Secondary | ICD-10-CM | POA: Diagnosis not present

## 2021-03-15 DIAGNOSIS — M5459 Other low back pain: Secondary | ICD-10-CM | POA: Diagnosis not present

## 2021-03-15 DIAGNOSIS — N3941 Urge incontinence: Secondary | ICD-10-CM | POA: Diagnosis not present

## 2021-03-15 DIAGNOSIS — Z4789 Encounter for other orthopedic aftercare: Secondary | ICD-10-CM | POA: Diagnosis not present

## 2021-03-15 DIAGNOSIS — R2681 Unsteadiness on feet: Secondary | ICD-10-CM | POA: Diagnosis not present

## 2021-03-15 LAB — CBC WITH DIFFERENTIAL/PLATELET
Basophils Absolute: 0 10*3/uL (ref 0.0–0.1)
Basophils Relative: 0.6 % (ref 0.0–3.0)
Eosinophils Absolute: 0.3 10*3/uL (ref 0.0–0.7)
Eosinophils Relative: 4.1 % (ref 0.0–5.0)
HCT: 35.4 % — ABNORMAL LOW (ref 36.0–46.0)
Hemoglobin: 11.7 g/dL — ABNORMAL LOW (ref 12.0–15.0)
Lymphocytes Relative: 22.2 % (ref 12.0–46.0)
Lymphs Abs: 1.5 10*3/uL (ref 0.7–4.0)
MCHC: 32.9 g/dL (ref 30.0–36.0)
MCV: 92.6 fl (ref 78.0–100.0)
Monocytes Absolute: 0.6 10*3/uL (ref 0.1–1.0)
Monocytes Relative: 9.3 % (ref 3.0–12.0)
Neutro Abs: 4.4 10*3/uL (ref 1.4–7.7)
Neutrophils Relative %: 63.8 % (ref 43.0–77.0)
Platelets: 292 10*3/uL (ref 150.0–400.0)
RBC: 3.82 Mil/uL — ABNORMAL LOW (ref 3.87–5.11)
RDW: 14.2 % (ref 11.5–15.5)
WBC: 6.9 10*3/uL (ref 4.0–10.5)

## 2021-03-15 LAB — COMPREHENSIVE METABOLIC PANEL
ALT: 14 U/L (ref 0–35)
AST: 16 U/L (ref 0–37)
Albumin: 3.9 g/dL (ref 3.5–5.2)
Alkaline Phosphatase: 63 U/L (ref 39–117)
BUN: 20 mg/dL (ref 6–23)
CO2: 24 mEq/L (ref 19–32)
Calcium: 9.4 mg/dL (ref 8.4–10.5)
Chloride: 104 mEq/L (ref 96–112)
Creatinine, Ser: 1.14 mg/dL (ref 0.40–1.20)
GFR: 42.79 mL/min — ABNORMAL LOW (ref >60.00)
Glucose, Bld: 102 mg/dL — ABNORMAL HIGH (ref 70–99)
Potassium: 4.2 mEq/L (ref 3.5–5.1)
Sodium: 136 mEq/L (ref 135–145)
Total Bilirubin: 0.4 mg/dL (ref 0.2–1.2)
Total Protein: 6.2 g/dL (ref 6.0–8.3)

## 2021-03-17 DIAGNOSIS — Z20828 Contact with and (suspected) exposure to other viral communicable diseases: Secondary | ICD-10-CM | POA: Diagnosis not present

## 2021-03-19 DIAGNOSIS — R2681 Unsteadiness on feet: Secondary | ICD-10-CM | POA: Diagnosis not present

## 2021-03-19 DIAGNOSIS — M6281 Muscle weakness (generalized): Secondary | ICD-10-CM | POA: Diagnosis not present

## 2021-03-19 DIAGNOSIS — M5459 Other low back pain: Secondary | ICD-10-CM | POA: Diagnosis not present

## 2021-03-19 DIAGNOSIS — Z4789 Encounter for other orthopedic aftercare: Secondary | ICD-10-CM | POA: Diagnosis not present

## 2021-03-19 DIAGNOSIS — N3941 Urge incontinence: Secondary | ICD-10-CM | POA: Diagnosis not present

## 2021-03-22 ENCOUNTER — Telehealth: Payer: Self-pay | Admitting: Neurology

## 2021-03-22 DIAGNOSIS — M6281 Muscle weakness (generalized): Secondary | ICD-10-CM | POA: Diagnosis not present

## 2021-03-22 DIAGNOSIS — N3941 Urge incontinence: Secondary | ICD-10-CM | POA: Diagnosis not present

## 2021-03-22 DIAGNOSIS — M5459 Other low back pain: Secondary | ICD-10-CM | POA: Diagnosis not present

## 2021-03-22 DIAGNOSIS — R2681 Unsteadiness on feet: Secondary | ICD-10-CM | POA: Diagnosis not present

## 2021-03-22 DIAGNOSIS — Z4789 Encounter for other orthopedic aftercare: Secondary | ICD-10-CM | POA: Diagnosis not present

## 2021-03-22 NOTE — Telephone Encounter (Signed)
Medicare/[place] order sent to [facility], they will reach out to the patient to schedule.

## 2021-03-25 DIAGNOSIS — M5459 Other low back pain: Secondary | ICD-10-CM | POA: Diagnosis not present

## 2021-03-25 DIAGNOSIS — Z4789 Encounter for other orthopedic aftercare: Secondary | ICD-10-CM | POA: Diagnosis not present

## 2021-03-25 DIAGNOSIS — R2681 Unsteadiness on feet: Secondary | ICD-10-CM | POA: Diagnosis not present

## 2021-03-25 DIAGNOSIS — N3941 Urge incontinence: Secondary | ICD-10-CM | POA: Diagnosis not present

## 2021-03-25 DIAGNOSIS — M6281 Muscle weakness (generalized): Secondary | ICD-10-CM | POA: Diagnosis not present

## 2021-03-29 ENCOUNTER — Ambulatory Visit: Payer: Medicare Other | Admitting: Family Medicine

## 2021-03-29 DIAGNOSIS — Z4789 Encounter for other orthopedic aftercare: Secondary | ICD-10-CM | POA: Diagnosis not present

## 2021-03-29 DIAGNOSIS — R2681 Unsteadiness on feet: Secondary | ICD-10-CM | POA: Diagnosis not present

## 2021-03-29 DIAGNOSIS — N3941 Urge incontinence: Secondary | ICD-10-CM | POA: Diagnosis not present

## 2021-03-29 DIAGNOSIS — M5459 Other low back pain: Secondary | ICD-10-CM | POA: Diagnosis not present

## 2021-03-29 DIAGNOSIS — M6281 Muscle weakness (generalized): Secondary | ICD-10-CM | POA: Diagnosis not present

## 2021-03-31 DIAGNOSIS — R2681 Unsteadiness on feet: Secondary | ICD-10-CM | POA: Diagnosis not present

## 2021-03-31 DIAGNOSIS — M6281 Muscle weakness (generalized): Secondary | ICD-10-CM | POA: Diagnosis not present

## 2021-03-31 DIAGNOSIS — M5459 Other low back pain: Secondary | ICD-10-CM | POA: Diagnosis not present

## 2021-03-31 DIAGNOSIS — Z4789 Encounter for other orthopedic aftercare: Secondary | ICD-10-CM | POA: Diagnosis not present

## 2021-04-01 ENCOUNTER — Ambulatory Visit
Admission: RE | Admit: 2021-04-01 | Discharge: 2021-04-01 | Disposition: A | Discharge status: Home / Self Care | Payer: Medicare Other | Source: Ambulatory Visit | Attending: Neurology | Admitting: Neurology

## 2021-04-01 DIAGNOSIS — G309 Alzheimer's disease, unspecified: Secondary | ICD-10-CM

## 2021-04-01 DIAGNOSIS — R413 Other amnesia: Secondary | ICD-10-CM | POA: Diagnosis not present

## 2021-04-01 DIAGNOSIS — F0390 Unspecified dementia without behavioral disturbance: Secondary | ICD-10-CM

## 2021-04-01 MED ORDER — GADOBENATE DIMEGLUMINE 529 MG/ML IV SOLN
13.0000 mL | Freq: Once | INTRAVENOUS | Status: AC | PRN
  Administered 2021-04-01: 13 mL via INTRAVENOUS

## 2021-04-06 DIAGNOSIS — M5459 Other low back pain: Secondary | ICD-10-CM | POA: Diagnosis not present

## 2021-04-06 DIAGNOSIS — R2681 Unsteadiness on feet: Secondary | ICD-10-CM | POA: Diagnosis not present

## 2021-04-06 DIAGNOSIS — Z4789 Encounter for other orthopedic aftercare: Secondary | ICD-10-CM | POA: Diagnosis not present

## 2021-04-06 DIAGNOSIS — M6281 Muscle weakness (generalized): Secondary | ICD-10-CM | POA: Diagnosis not present

## 2021-04-07 DIAGNOSIS — R2681 Unsteadiness on feet: Secondary | ICD-10-CM | POA: Diagnosis not present

## 2021-04-07 DIAGNOSIS — Z20828 Contact with and (suspected) exposure to other viral communicable diseases: Secondary | ICD-10-CM | POA: Diagnosis not present

## 2021-04-07 DIAGNOSIS — M5459 Other low back pain: Secondary | ICD-10-CM | POA: Diagnosis not present

## 2021-04-07 DIAGNOSIS — Z4789 Encounter for other orthopedic aftercare: Secondary | ICD-10-CM | POA: Diagnosis not present

## 2021-04-07 DIAGNOSIS — M6281 Muscle weakness (generalized): Secondary | ICD-10-CM | POA: Diagnosis not present

## 2021-04-12 DIAGNOSIS — M5459 Other low back pain: Secondary | ICD-10-CM | POA: Diagnosis not present

## 2021-04-12 DIAGNOSIS — M6281 Muscle weakness (generalized): Secondary | ICD-10-CM | POA: Diagnosis not present

## 2021-04-12 DIAGNOSIS — Z4789 Encounter for other orthopedic aftercare: Secondary | ICD-10-CM | POA: Diagnosis not present

## 2021-04-12 DIAGNOSIS — R2681 Unsteadiness on feet: Secondary | ICD-10-CM | POA: Diagnosis not present

## 2021-04-14 ENCOUNTER — Telehealth: Payer: Self-pay | Admitting: *Deleted

## 2021-04-14 DIAGNOSIS — R2681 Unsteadiness on feet: Secondary | ICD-10-CM | POA: Diagnosis not present

## 2021-04-14 DIAGNOSIS — Z4789 Encounter for other orthopedic aftercare: Secondary | ICD-10-CM | POA: Diagnosis not present

## 2021-04-14 DIAGNOSIS — M6281 Muscle weakness (generalized): Secondary | ICD-10-CM | POA: Diagnosis not present

## 2021-04-14 DIAGNOSIS — Z20828 Contact with and (suspected) exposure to other viral communicable diseases: Secondary | ICD-10-CM | POA: Diagnosis not present

## 2021-04-14 DIAGNOSIS — M5459 Other low back pain: Secondary | ICD-10-CM | POA: Diagnosis not present

## 2021-04-14 NOTE — Telephone Encounter (Signed)
Spoke to patients Daughter Izora Gala Soloman(DPR CHECKED) . Gave daughter results of MRI. Daughter asked if she can go on mychart to see results I stated yes.Daughter thanked me for calling .

## 2021-04-20 DIAGNOSIS — Z4789 Encounter for other orthopedic aftercare: Secondary | ICD-10-CM | POA: Diagnosis not present

## 2021-04-20 DIAGNOSIS — M5459 Other low back pain: Secondary | ICD-10-CM | POA: Diagnosis not present

## 2021-04-20 DIAGNOSIS — M6281 Muscle weakness (generalized): Secondary | ICD-10-CM | POA: Diagnosis not present

## 2021-04-20 DIAGNOSIS — R2681 Unsteadiness on feet: Secondary | ICD-10-CM | POA: Diagnosis not present

## 2021-04-21 DIAGNOSIS — Z20828 Contact with and (suspected) exposure to other viral communicable diseases: Secondary | ICD-10-CM | POA: Diagnosis not present

## 2021-04-26 DIAGNOSIS — Z20828 Contact with and (suspected) exposure to other viral communicable diseases: Secondary | ICD-10-CM | POA: Diagnosis not present

## 2021-04-26 DIAGNOSIS — M5459 Other low back pain: Secondary | ICD-10-CM | POA: Diagnosis not present

## 2021-04-26 DIAGNOSIS — M6281 Muscle weakness (generalized): Secondary | ICD-10-CM | POA: Diagnosis not present

## 2021-04-26 DIAGNOSIS — Z4789 Encounter for other orthopedic aftercare: Secondary | ICD-10-CM | POA: Diagnosis not present

## 2021-04-26 DIAGNOSIS — Z1159 Encounter for screening for other viral diseases: Secondary | ICD-10-CM | POA: Diagnosis not present

## 2021-04-26 DIAGNOSIS — R2681 Unsteadiness on feet: Secondary | ICD-10-CM | POA: Diagnosis not present

## 2021-04-28 DIAGNOSIS — R2681 Unsteadiness on feet: Secondary | ICD-10-CM | POA: Diagnosis not present

## 2021-04-28 DIAGNOSIS — Z4789 Encounter for other orthopedic aftercare: Secondary | ICD-10-CM | POA: Diagnosis not present

## 2021-04-28 DIAGNOSIS — M5459 Other low back pain: Secondary | ICD-10-CM | POA: Diagnosis not present

## 2021-04-28 DIAGNOSIS — M6281 Muscle weakness (generalized): Secondary | ICD-10-CM | POA: Diagnosis not present

## 2021-04-29 ENCOUNTER — Telehealth: Payer: Self-pay | Admitting: Neurology

## 2021-04-29 ENCOUNTER — Ambulatory Visit (INDEPENDENT_AMBULATORY_CARE_PROVIDER_SITE_OTHER): Payer: Medicare Other

## 2021-04-29 ENCOUNTER — Other Ambulatory Visit: Payer: Self-pay

## 2021-04-29 ENCOUNTER — Encounter: Payer: Self-pay | Admitting: Family Medicine

## 2021-04-29 DIAGNOSIS — Z0000 Encounter for general adult medical examination without abnormal findings: Secondary | ICD-10-CM

## 2021-04-29 NOTE — Progress Notes (Signed)
Virtual Visit via Telephone Note  I connected with  Sara Medina on 04/29/21 at  2:30 PM EST by telephone and verified that I am speaking with the correct person using two identifiers.  Medicare Annual Wellness visit completed telephonically due to Covid-19 pandemic.   Persons participating in this call: This Health Coach and this patient. And her daughter Jeani Hawking  Location: Patient: Home  Provider: Office   I discussed the limitations, risks, security and privacy concerns of performing an evaluation and management service by telephone and the availability of in person appointments. The patient expressed understanding and agreed to proceed.  Unable to perform video visit due to video visit attempted and failed and/or patient does not have video capability.   Some vital signs may be absent or patient reported.   Willette Brace, LPN   Subjective:   Sara Medina is a 87 y.o. female who presents for Medicare Annual (Subsequent) preventive examination.  Review of Systems     Cardiac Risk Factors include: advanced age (>88men, >4 women);hypertension;dyslipidemia     Objective:    There were no vitals filed for this visit. There is no height or weight on file to calculate BMI.  Advanced Directives 04/29/2021 02/12/2021 01/21/2021 02/06/2020 05/12/2017 05/13/2013  Does Patient Have a Medical Advance Directive? Yes Yes No Yes Yes Patient does not have advance directive  Type of Paramedic of [place];Living will - [place];Living will - -  Does patient want to make changes to medical advance directive? - - - No - Patient declined - -  Copy of [place] in Chart? No - copy requested - - No - copy requested - -  Would patient like information on creating a medical advance directive? - - Yes (MAU/Ambulatory/Procedural Areas - Information given) - - -  Pre-existing out of facility DNR order (yellow form  or pink MOST form) - - - - - No    Current Medications (verified) Outpatient Encounter Medications as of 04/29/2021  Medication Sig   amLODipine (NORVASC) 5 MG tablet TAKE ONE TABLET BY MOUTH DAILY   CALCIUM PO Take 1 capsule by mouth daily.   Cholecalciferol (VITAMIN D-3) 25 MCG (1000 UT) CAPS Take 1,000 Units by mouth daily.   donepezil (ARICEPT) 5 MG tablet Take 1 tablet (5 mg total) by mouth at bedtime.   gabapentin (NEURONTIN) 100 MG capsule Take 4 capsules (400 mg total) by mouth at bedtime.   levothyroxine (SYNTHROID) 25 MCG tablet TAKE ONE TABLET BY MOUTH DAILY BEFORE BREAKFAST   metoprolol tartrate (LOPRESSOR) 25 MG tablet TAKE TWO TABLETS BY MOUTH EVERY MORNING AND TAKE ONE TABLET BY MOUTH EVERY EVENING   Probiotic Product (ALIGN) 4 MG CAPS Take 4 mg by mouth daily.   vitamin B-12 (CYANOCOBALAMIN) 500 MCG tablet Take 500 mcg by mouth 2 (two) times daily.   EPINEPHrine 0.3 mg/0.3 mL IJ SOAJ injection Inject 0.3 mg into the muscle as needed for anaphylaxis.    OVER THE COUNTER MEDICATION Apply 1 application topically at bedtime. Topricin foot cream (Patient not taking: Reported on 04/29/2021)   Facility-Administered Encounter Medications as of 04/29/2021  Medication   cloNIDine (CATAPRES) tablet 0.1 mg    Allergies (verified) Other, Peanut-containing drug products, Pecan nut (diagnostic), and Penicillins   History: Past Medical History:  Diagnosis Date   ALLERGIC RHINITIS 05/04/2010   Qualifier: Diagnosis of  By: Jimmye Norman LPN, Winfield Cunas    Allergy  Arthritis    Asthma, exercise induced 08/18/2010    She uses the Proventil inhaler on rare occasions not on a daily basis    Chest tightness or pressure 05/13/2013   COLONIC POLYPS 05/04/2010   Qualifier: Diagnosis of  By: Jimmye Norman, LPN, Winfield Cunas    Cystocele 10/02/2014   Depression    mood disorder listed after off HRT   DIVERTICULITIS, HX OF 05/04/2010   Qualifier: Diagnosis of  By: Jimmye Norman LPN, Winfield Cunas    Essential  hypertension 05/04/2010   Qualifier: Diagnosis of  By: Jimmye Norman, LPN, Winfield Cunas    GERD 05/04/2010   Qualifier: Diagnosis of  By: Jimmye Norman LPN, Winfield Cunas    History of diverticulitis of colon    HYPERLIPIDEMIA 05/04/2010   Qualifier: Diagnosis of  By: Jimmye Norman, LPN, Winfield Cunas    Hypertension    Hypothyroidism 05/04/2010   Qualifier: Diagnosis of  By: Jimmye Norman, LPN, Winfield Cunas    Menopausal disorder 08/18/2010   When she went off the HRT she had severe mood disorder    Mitral valve prolapse    Neuropathy    Osteoarthritis 05/04/2010   Qualifier: Diagnosis of  By: Jimmye Norman, LPN, Winfield Cunas    Palpitations 06/30/2014   Pneumonia    Spinal stenosis    Thyroid disease    Past Surgical History:  Procedure Laterality Date   ABDOMINAL HYSTERECTOMY     BACK SURGERY     CATARACT EXTRACTION Bilateral    cataract surgery     COLONOSCOPY     EYE SURGERY     LAMINECTOMY WITH POSTERIOR LATERAL ARTHRODESIS LEVEL 1 N/A 01/27/2021   Procedure: Laminectomy and Foraminotomy - Lumbar Three-Four/Lumbar Four-Five, posterior fusion with fixation Lumbar Four-Five;  Surgeon: Eustace Moore, MD;  Location: [place];  Service: Neurosurgery;  Laterality: N/A;   SHOULDER SURGERY Right    TONSILLECTOMY     trigger thumb Right    TUBAL LIGATION     WISDOM TOOTH EXTRACTION     x4   Family History  Problem Relation Age of Onset   Ovarian cancer Mother        in 61s   Heart disease Father        died at 74   Stroke Father    Allergies Father    Arthritis Father    Dementia Father    Cancer Sister        brain. 18 died   Healthy Sister    Other Brother        died at pneumonia young   Breast cancer Maternal Aunt    Colon cancer Neg Hx    Social History   Socioeconomic History   Marital status: Widowed    Spouse name: Not on file   Number of children: 3   Years of education: Not on file   Highest education level: Not on file  Occupational History   Occupation: retired    Fish farm manager: RETIRED   Tobacco Use   Smoking status: Never   Smokeless tobacco: Never  Vaping Use   Vaping Use: Never used  Substance and Sexual Activity   Alcohol use: Yes    Alcohol/week: 5.0 standard drinks    Types: 5 Glasses of wine per week    Comment: 1-2 per night. consider eliminating with memory issues.   Drug use: No   Sexual activity: Not Currently  Other Topics Concern   Not on file  Social History Narrative   Widowed around 2013. Lives alone with  cat at [employer]. Has caregiver in the morning.    3 children- 2 daughters live close. 5 grandkids. 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 Was great athlete.       Hobbies: enjoys tv, wine         Social Determinants of Radio broadcast assistant Strain: Low Risk    Difficulty of Paying Living Expenses: Not hard at all  Food Insecurity: No Food Insecurity   Worried About Charity fundraiser in the Last Year: Never true   Arboriculturist in the Last Year: Never true  Transportation Needs: No Transportation Needs   Lack of Transportation (Medical): No   Lack of Transportation (Non-Medical): No  Physical Activity: Insufficiently Active   Days of Exercise per Week: 5 days   Minutes of Exercise per Session: 20 min  Stress: No Stress Concern Present   Feeling of Stress : Not at all  Social Connections: Moderately Isolated   Frequency of Communication with Friends and Family: Three times a week   Frequency of Social Gatherings with Friends and Family: More than three times a week   Attends Religious Services: Never   Marine scientist or Organizations: Yes   Attends Archivist Meetings: 1 to 4 times per year   Marital Status: Widowed    Tobacco Counseling Counseling given: Not Answered   Clinical Intake:  Pre-visit preparation completed: Yes  Pain : No/denies pain     BMI - recorded: 24.74 Nutritional Status: BMI of 19-24  Normal Nutritional Risks:  None Diabetes: No  How often do you need to have someone help you when you read instructions, pamphlets, or other written materials from your doctor or pharmacy?: 1 - Never  Diabetic?No  Interpreter Needed?: No  Information entered by :: Charlott Rakes, LPN   Activities of Daily Living In your present state of health, do you have any difficulty performing the following activities: 04/29/2021 01/21/2021  Hearing? Y Y  Comment - hearing aids bilaterally  Vision? N N  Difficulty concentrating or making decisions? N Y  Walking or climbing stairs? N Y  Dressing or bathing? N N  Doing errands, shopping? N N  Preparing Food and eating ? N -  Using the Toilet? N -  In the past six months, have you accidently leaked urine? N -  Do you have problems with loss of bowel control? Y -  Comment wears a pad -  Managing your Medications? N -  Managing your Finances? N -  Housekeeping or managing your Housekeeping? N -  Some recent data might be hidden    Patient Care Team: Marin Olp, MD as PCP - General (Family Medicine) Fay Records, MD as PCP - Cardiology (Cardiology)  Indicate any recent Medical Services you may have received from other than [facility] providers in the past year (date may be approximate).     Assessment:   This is a routine wellness examination for [place].  Hearing/Vision screen Hearing Screening - Comments:: Pt wears hearing aids  Vision Screening - Comments:: Pt follows up with different providers   Dietary issues and exercise activities discussed: Current Exercise Habits: Home exercise routine, Type of exercise: walking, Time (Minutes): 30, Frequency (Times/Week): 5, Weekly Exercise (Minutes/Week): 150   Goals Addressed             This Visit's Progress    Patient Stated  Lose weight        Depression Screen PHQ 2/9 Scores 04/29/2021 02/06/2020 11/04/2019 06/25/2018 05/12/2017 08/23/2016 11/05/2015  PHQ - 2 Score 0 0 0 0 0 0 0  PHQ- 9 Score - 0 - 0 -  - -    Fall Risk Fall Risk  04/29/2021 02/06/2020 11/04/2019 06/25/2018 05/12/2017  Falls in the past year? 0 0 0 0 Yes  Number falls in past yr: 0 0 0 - 1  Injury with Fall? 0 0 0 - No  Risk for fall due to : Impaired vision;Impaired balance/gait;Impaired mobility Impaired balance/gait;Medication side effect - - -  Risk for fall due to: Comment at times uses a walker - - - -  Follow up Falls prevention discussed Falls evaluation completed;Falls prevention discussed - - Education provided    FALL RISK PREVENTION PERTAINING TO THE HOME:  Any stairs in or around the home? Yes  If so, are there any without handrails? No  Home free of loose throw rugs in walkways, pet beds, electrical cords, etc? Yes  Adequate lighting in your home to reduce risk of falls? Yes   ASSISTIVE DEVICES UTILIZED TO PREVENT FALLS:  Life alert? Yes  Use of a cane, walker or w/c? Yes  Grab bars in the bathroom? Yes  Shower chair or bench in shower? Yes  Elevated toilet seat or a handicapped toilet? Yes   TIMED UP AND GO:  Was the test performed? No .   Cognitive Function: MMSE - Mini Mental State Exam 03/10/2021 05/12/2017  Not completed: - (No Data)  Orientation to time 5 -  Orientation to Place 5 -  Registration 3 -  Attention/ Calculation 1 -  Recall 3 -  Language- name 2 objects 2 -  Language- repeat 0 -  Language- follow 3 step command 3 -  Language- read & follow direction 1 -  Write a sentence 1 -  Copy design 0 -  Total score 24 -     6CIT Screen 04/29/2021 02/06/2020  What Year? 0 points 4 points  What month? 0 points 0 points  What time? 0 points 0 points  Count back from 20 0 points 0 points  Months in reverse 0 points 2 points  Repeat phrase 0 points 0 points  Total Score 0 6    Immunizations Immunization History  Administered Date(s) Administered   Fluad Quad(high Dose 65+) 03/02/2020   Influenza Split 02/18/2011, 02/27/2012   Influenza Whole 02/23/2010   Influenza, High Dose  Seasonal PF 02/24/2014, 03/09/2015, 02/11/2016, 02/13/2017, 02/07/2019   Influenza,inj,Quad PF,6+ Mos 02/12/2013   Influenza-Unspecified 02/26/2021   Moderna Sars-Covid-2 Vaccination 06/20/2019, 07/18/2019, 03/26/2020, 09/24/2020   Pneumococcal Polysaccharide-23 03/04/2003   Td 03/10/2002   Tdap 02/27/2012   Zoster Recombinat (Shingrix) 10/21/2017, 12/25/2017, 12/25/2017   Zoster, Live 02/02/2005    TDAP status: Up to date  Flu Vaccine status: Up to date    Covid-19 vaccine status: Completed vaccines  Qualifies for Shingles Vaccine? Yes   Zostavax completed Yes   Shingrix Completed?: Yes  Screening Tests Health Maintenance  Topic Date Due   Pneumonia Vaccine 59+ Years old (2 - PCV) 03/03/2004   COVID-19 Vaccine (5 - Booster for Moderna series) 11/19/2020   TETANUS/TDAP  02/26/2022   INFLUENZA VACCINE  Completed   DEXA SCAN  Completed   Zoster Vaccines- Shingrix  Completed   HPV VACCINES  Aged Out    Health Maintenance  Health Maintenance Due  Topic Date Due   Pneumonia Vaccine  20+ Years old (2 - PCV) 03/03/2004   COVID-19 Vaccine (5 - Booster for Moderna series) 11/19/2020    Colorectal cancer screening: No longer required.   Mammogram status: Completed 12/27/19. Repeat every year  Bone Density status: Completed 1/31//20. Results reflect: Bone density results: OSTEOPENIA. Repeat every 2 years.  Additional Screening:  Vision Screening: Recommended annual ophthalmology exams for early detection of glaucoma and other disorders of the eye. Is the patient up to date with their annual eye exam?  Yes  Who is the provider or what is the name of the office in which the patient attends annual eye exams? Various providers   If pt is not established with a provider, would they like to be referred to a provider to establish care? No .   Dental Screening: Recommended annual dental exams for proper oral hygiene  Community Resource Referral / Chronic Care Management: CRR  required this visit?  No   CCM required this visit?  No      Plan:     I have personally reviewed and noted the following in the patient's chart:   Medical and social history Use of alcohol, tobacco or illicit drugs  Current medications and supplements including opioid prescriptions.  Functional ability and status Nutritional status Physical activity Advanced directives List of other physicians Hospitalizations, surgeries, and ER visits in previous 12 months Vitals Screenings to include cognitive, depression, and falls Referrals and appointments  In addition, I have reviewed and discussed with patient certain preventive protocols, quality metrics, and best practice recommendations. A written personalized care plan for preventive services as well as general preventive health recommendations were provided to patient.     Willette Brace, LPN   [id number]   Nurse Notes: None

## 2021-04-29 NOTE — Telephone Encounter (Signed)
I called the daughter of pt.  LMVM for her that per Dr. Jaynee Eagles can increase the gabapentin to 600mg  po qhs  (take 6 of the 100mg  capsules at bedtime and see if this helps.  May increase to 900mg  po qh as well.  See how she does and let us know.  Contact on Monday for questions.

## 2021-04-29 NOTE — Telephone Encounter (Signed)
I called daughter of pt.  She is having increasing cramping pain in BLE (thighs, calves and toes).  This is at night.  Taking gabapentin 400mg  po qhs which from last 03-10-21 ofv was ok, but now has stopped working. [facility].  No new medications per daughter.  Tolerating the donepezil.  Will call her back when Dr. Jaynee Eagles addresses if not before 5p, then check mychart.  She verbalized understanding.

## 2021-04-29 NOTE — Patient Instructions (Signed)
Sara Medina , Thank you for taking time to come for your Medicare Wellness Visit. I appreciate your ongoing commitment to your health goals. Please review the following plan we discussed and let me know if I can assist you in the future.   Screening recommendations/referrals: Colonoscopy: No longer required  Mammogram: Done 12/27/19 Bone Density: Done 06/29/18 repeat every 2 years  Recommended yearly ophthalmology/optometry visit for glaucoma screening and checkup Recommended yearly dental visit for hygiene and checkup  Vaccinations: Influenza vaccine: Done 02/26/21 repeat every year  Pneumococcal vaccine: Discuss with provider  Tdap vaccine: Done 02/27/12 Shingles vaccine: Completed 10/21/17 & 12/25/17   Covid-19:Completed 1/21, 2/18, 03/26/20 & 09/24/20  Advanced directives: Please bring a copy of your health care power of attorney and living will to the office at your convenience.  Conditions/risks identified: lose weight   Next appointment: Follow up in one year for your annual wellness visit    Preventive Care 65 Years and Older, Female Preventive care refers to lifestyle choices and visits with your health care provider that can promote health and wellness. What does preventive care include? A yearly physical exam. This is also called an annual well check. Dental exams once or twice a year. Routine eye exams. Ask your health care provider how often you should have your eyes checked. Personal lifestyle choices, including: Daily care of your teeth and gums. Regular physical activity. Eating a healthy diet. Avoiding tobacco and drug use. Limiting alcohol use. Practicing safe sex. Taking low-dose aspirin every day. Taking vitamin and mineral supplements as recommended by your health care provider. What happens during an annual well check? The services and screenings done by your health care provider during your annual well check will depend on your age, overall health, lifestyle risk  factors, and family history of disease. Counseling  Your health care provider may ask you questions about your: Alcohol use. Tobacco use. Drug use. Emotional well-being. Home and relationship well-being. Sexual activity. Eating habits. History of falls. Memory and ability to understand (cognition). Work and work Statistician. Reproductive health. Screening  You may have the following tests or measurements: Height, weight, and BMI. Blood pressure. Lipid and cholesterol levels. These may be checked every 5 years, or more frequently if you are over 80 years old. Skin check. Lung cancer screening. You may have this screening every year starting at age 85 if you have a 30-pack-year history of smoking and currently smoke or have quit within the past 15 years. Fecal occult blood test (FOBT) of the stool. You may have this test every year starting at age 85. Flexible sigmoidoscopy or colonoscopy. You may have a sigmoidoscopy every 5 years or a colonoscopy every 10 years starting at age 85. Hepatitis C blood test. Hepatitis B blood test. Sexually transmitted disease (STD) testing. Diabetes screening. This is done by checking your blood sugar (glucose) after you have not eaten for a while (fasting). You may have this done every 1-3 years. Bone density scan. This is done to screen for osteoporosis. You may have this done starting at age 85. Mammogram. This may be done every 1-2 years. Talk to your health care provider about how often you should have regular mammograms. Talk with your health care provider about your test results, treatment options, and if necessary, the need for more tests. Vaccines  Your health care provider may recommend certain vaccines, such as: Influenza vaccine. This is recommended every year. Tetanus, diphtheria, and acellular pertussis (Tdap, Td) vaccine. You may need a Td  booster every 10 years. Zoster vaccine. You may need this after age 28. Pneumococcal 13-valent  conjugate (PCV13) vaccine. One dose is recommended after age 85. Pneumococcal polysaccharide (PPSV23) vaccine. One dose is recommended after age 85. Talk to your health care provider about which screenings and vaccines you need and how often you need them. This information is not intended to replace advice given to you by your health care provider. Make sure you discuss any questions you have with your health care provider. Document Released: 06/12/2015 Document Revised: 02/03/2016 Document Reviewed: 03/17/2015 Elsevier Interactive Patient Education  2017 [employer] Prevention in the Home Falls can cause injuries. They can happen to people of all ages. There are many things you can do to make your home safe and to help prevent falls. What can I do on the outside of my home? Regularly fix the edges of walkways and driveways and fix any cracks. Remove anything that might make you trip as you walk through a door, such as a raised step or threshold. Trim any bushes or trees on the path to your home. Use bright outdoor lighting. Clear any walking paths of anything that might make someone trip, such as rocks or tools. Regularly check to see if handrails are loose or broken. Make sure that both sides of any steps have handrails. Any raised decks and porches should have guardrails on the edges. Have any leaves, snow, or ice cleared regularly. Use sand or salt on walking paths during winter. Clean up any spills in your garage right away. This includes oil or grease spills. What can I do in the bathroom? Use night lights. Install grab bars by the toilet and in the tub and shower. Do not use towel bars as grab bars. Use non-skid mats or decals in the tub or shower. If you need to sit down in the shower, use a plastic, non-slip stool. Keep the floor dry. Clean up any water that spills on the floor as soon as it happens. Remove soap buildup in the tub or shower regularly. Attach bath mats  securely with double-sided non-slip rug tape. Do not have throw rugs and other things on the floor that can make you trip. What can I do in the bedroom? Use night lights. Make sure that you have a light by your bed that is easy to reach. Do not use any sheets or blankets that are too big for your bed. They should not hang down onto the floor. Have a firm chair that has side arms. You can use this for support while you get dressed. Do not have throw rugs and other things on the floor that can make you trip. What can I do in the kitchen? Clean up any spills right away. Avoid walking on wet floors. Keep items that you use a lot in easy-to-reach places. If you need to reach something above you, use a strong step stool that has a grab bar. Keep electrical cords out of the way. Do not use floor polish or wax that makes floors slippery. If you must use wax, use non-skid floor wax. Do not have throw rugs and other things on the floor that can make you trip. What can I do with my stairs? Do not leave any items on the stairs. Make sure that there are handrails on both sides of the stairs and use them. Fix handrails that are broken or loose. Make sure that handrails are as long as the stairways. Check any carpeting  to make sure that it is firmly attached to the stairs. Fix any carpet that is loose or worn. Avoid having throw rugs at the top or bottom of the stairs. If you do have throw rugs, attach them to the floor with carpet tape. Make sure that you have a light switch at the top of the stairs and the bottom of the stairs. If you do not have them, ask someone to add them for you. What else can I do to help prevent falls? Wear shoes that: Do not have high heels. Have rubber bottoms. Are comfortable and fit you well. Are closed at the toe. Do not wear sandals. If you use a stepladder: Make sure that it is fully opened. Do not climb a closed stepladder. Make sure that both sides of the stepladder  are locked into place. Ask someone to hold it for you, if possible. Clearly mark and make sure that you can see: Any grab bars or handrails. First and last steps. Where the edge of each step is. Use tools that help you move around (mobility aids) if they are needed. These include: Canes. Walkers. Scooters. Crutches. Turn on the lights when you go into a dark area. Replace any light bulbs as soon as they burn out. Set up your furniture so you have a clear path. Avoid moving your furniture around. If any of your floors are uneven, fix them. If there are any pets around you, be aware of where they are. Review your medicines with your doctor. Some medicines can make you feel dizzy. This can increase your chance of falling. Ask your doctor what other things that you can do to help prevent falls. This information is not intended to replace advice given to you by your health care provider. Make sure you discuss any questions you have with your health care provider. Document Released: 03/12/2009 Document Revised: 10/22/2015 Document Reviewed: 06/20/2014 Elsevier Interactive Patient Education  2017 [employer].

## 2021-04-29 NOTE — Telephone Encounter (Signed)
Pt's daughter Izora Gala called has some questions about the gabapentin (NEURONTIN) 100 MG capsule. States her mothers leg pain is coming back in the night wanting to know what are the other options. Izora Gala requesting a call back.

## 2021-05-03 DIAGNOSIS — R2681 Unsteadiness on feet: Secondary | ICD-10-CM | POA: Diagnosis not present

## 2021-05-03 DIAGNOSIS — M5459 Other low back pain: Secondary | ICD-10-CM | POA: Diagnosis not present

## 2021-05-03 DIAGNOSIS — Z1159 Encounter for screening for other viral diseases: Secondary | ICD-10-CM | POA: Diagnosis not present

## 2021-05-03 DIAGNOSIS — M6281 Muscle weakness (generalized): Secondary | ICD-10-CM | POA: Diagnosis not present

## 2021-05-03 DIAGNOSIS — Z20828 Contact with and (suspected) exposure to other viral communicable diseases: Secondary | ICD-10-CM | POA: Diagnosis not present

## 2021-05-03 DIAGNOSIS — Z4789 Encounter for other orthopedic aftercare: Secondary | ICD-10-CM | POA: Diagnosis not present

## 2021-05-10 DIAGNOSIS — Z1159 Encounter for screening for other viral diseases: Secondary | ICD-10-CM | POA: Diagnosis not present

## 2021-05-10 DIAGNOSIS — Z20828 Contact with and (suspected) exposure to other viral communicable diseases: Secondary | ICD-10-CM | POA: Diagnosis not present

## 2021-05-14 DIAGNOSIS — Z1159 Encounter for screening for other viral diseases: Secondary | ICD-10-CM | POA: Diagnosis not present

## 2021-05-14 DIAGNOSIS — Z20828 Contact with and (suspected) exposure to other viral communicable diseases: Secondary | ICD-10-CM | POA: Diagnosis not present

## 2021-05-17 ENCOUNTER — Telehealth (INDEPENDENT_AMBULATORY_CARE_PROVIDER_SITE_OTHER): Payer: Medicare Other | Admitting: Family Medicine

## 2021-05-17 ENCOUNTER — Other Ambulatory Visit: Payer: Self-pay

## 2021-05-17 ENCOUNTER — Encounter: Payer: Self-pay | Admitting: Family Medicine

## 2021-05-17 VITALS — Ht 64.0 in | Wt 139.6 lb

## 2021-05-17 DIAGNOSIS — R051 Acute cough: Secondary | ICD-10-CM | POA: Diagnosis not present

## 2021-05-17 DIAGNOSIS — U071 COVID-19: Secondary | ICD-10-CM | POA: Diagnosis not present

## 2021-05-17 DIAGNOSIS — Z20828 Contact with and (suspected) exposure to other viral communicable diseases: Secondary | ICD-10-CM | POA: Diagnosis not present

## 2021-05-17 DIAGNOSIS — Z1159 Encounter for screening for other viral diseases: Secondary | ICD-10-CM | POA: Diagnosis not present

## 2021-05-17 LAB — POC COVID19 BINAXNOW: SARS Coronavirus 2 Ag: POSITIVE — AB

## 2021-05-17 MED ORDER — ALBUTEROL SULFATE HFA 108 (90 BASE) MCG/ACT IN AERS: 2.0000 | INHALATION_SPRAY | Freq: Four times a day (QID) | RESPIRATORY_TRACT | 0 refills | Status: DC | PRN

## 2021-05-17 MED ORDER — MOLNUPIRAVIR EUA 200MG CAPSULE: 4.0000 | ORAL_CAPSULE | Freq: Two times a day (BID) | ORAL | 0 refills | Status: AC

## 2021-05-17 NOTE — Patient Instructions (Signed)
Person Under Monitoring Name: Sara Medina  Location: [place] [address]   Infection Prevention Recommendations for Individuals Confirmed to have, or Being Evaluated for, 2019 Novel Coronavirus (COVID-19) Infection Who Receive Care at Home  Individuals who are confirmed to have, or are being evaluated for, COVID-19 should follow the prevention steps below until a healthcare provider or local or state health department says they can return to normal activities.  Stay home except to get medical care You should restrict activities outside your home, except for getting medical care. Do not go to work, school, or public areas, and do not use public transportation or taxis.  Call ahead before visiting your doctor Before your medical appointment, call the healthcare provider and tell them that you have, or are being evaluated for, COVID-19 infection. This will help the healthcare providers office take steps to keep other people from getting infected. Ask your healthcare provider to call the local or state health department.  Monitor your symptoms Seek prompt medical attention if your illness is worsening (e.g., difficulty breathing). Before going to your medical appointment, call the healthcare provider and tell them that you have, or are being evaluated for, COVID-19 infection. Ask your healthcare provider to call the local or state health department.  Wear a facemask You should wear a facemask that covers your nose and mouth when you are in the same room with other people and when you visit a healthcare provider. People who live with or visit you should also wear a facemask while they are in the same room with you.  Separate yourself from other people in your home As much as possible, you should stay in a different room from other people in your home. Also, you should use a separate bathroom, if available.  Avoid sharing household items You should not share  dishes, drinking glasses, cups, eating utensils, towels, bedding, or other items with other people in your home. After using these items, you should wash them thoroughly with soap and water.  Cover your coughs and sneezes Cover your mouth and nose with a tissue when you cough or sneeze, or you can cough or sneeze into your sleeve. Throw used tissues in a lined trash can, and immediately wash your hands with soap and water for at least 20 seconds or use an alcohol-based hand rub.  Wash your [facility] your hands often and thoroughly with soap and water for at least 20 seconds. You can use an alcohol-based hand sanitizer if soap and water are not available and if your hands are not visibly dirty. Avoid touching your eyes, nose, and mouth with unwashed hands.   Prevention Steps for Caregivers and Household Members of Individuals Confirmed to have, or Being Evaluated for, COVID-19 Infection Being Cared for in the Home  If you live with, or provide care at home for, a person confirmed to have, or being evaluated for, COVID-19 infection please follow these guidelines to prevent infection:  Follow healthcare providers instructions Make sure that you understand and can help the patient follow any healthcare provider instructions for all care.  Provide for the patients basic needs You should help the patient with basic needs in the home and provide support for getting groceries, prescriptions, and other personal needs.  Monitor the patients symptoms If they are getting sicker, call his or her medical provider and tell them that the patient has, or is being evaluated for, COVID-19 infection. This will help the healthcare providers office  take steps to keep other people from getting infected. °Ask the healthcare provider to call the local or state health department. ° °Limit the number of people who have contact with the patient °If possible, have only one caregiver for the patient. °Other  household members should stay in another home or place of residence. If this is not possible, they should stay °in another room, or be separated from the patient as much as possible. Use a separate bathroom, if available. °Restrict visitors who do not have an essential need to be in the home. ° °Keep older adults, very young children, and other sick people away from the patient °Keep older adults, very young children, and those who have compromised immune systems or chronic health conditions away from the patient. This includes people with chronic heart, lung, or kidney conditions, diabetes, and cancer. ° °Ensure good ventilation °Make sure that shared spaces in the home have good air flow, such as from an air conditioner or an opened window, °weather permitting. ° °Wash your hands often °Wash your hands often and thoroughly with soap and water for at least 20 seconds. You can use an alcohol based hand sanitizer if soap and water are not available and if your hands are not visibly dirty. °Avoid touching your eyes, nose, and mouth with unwashed hands. °Use disposable paper towels to dry your hands. If not available, use dedicated cloth towels and replace them when they become wet. ° °Wear a facemask and gloves °Wear a disposable facemask at all times in the room and gloves when you touch or have contact with the patient’s blood, body fluids, and/or secretions or excretions, such as sweat, saliva, sputum, nasal mucus, vomit, urine, or feces.  Ensure the mask fits over your nose and mouth tightly, and do not touch it during use. °Throw out disposable facemasks and gloves after using them. Do not reuse. °Wash your hands immediately after removing your facemask and gloves. °If your personal clothing becomes contaminated, carefully remove clothing and launder. Wash your hands after handling contaminated clothing. °Place all used disposable facemasks, gloves, and other waste in a lined container before disposing them with  other household waste. °Remove gloves and wash your hands immediately after handling these items. ° °Do not share dishes, glasses, or other household items with the patient °Avoid sharing household items. You should not share dishes, drinking glasses, cups, eating utensils, towels, bedding, or other items with a patient who is confirmed to have, or being evaluated for, COVID-19 infection. °After the person uses these items, you should wash them thoroughly with soap and water. ° °Wash laundry thoroughly °Immediately remove and wash clothes or bedding that have blood, body fluids, and/or secretions or excretions, such as sweat, saliva, sputum, nasal mucus, vomit, urine, or feces, on them. °Wear gloves when handling laundry from the patient. °Read and follow directions on labels of laundry or clothing items and detergent. In general, wash and dry with the warmest temperatures recommended on the label. ° °Clean all areas the individual has used often °Clean all touchable surfaces, such as counters, tabletops, doorknobs, bathroom fixtures, toilets, phones, keyboards, tablets, and bedside tables, every day. Also, clean any surfaces that may have blood, body fluids, and/or secretions or excretions on them. °Wear gloves when cleaning surfaces the patient has come in contact with. °Use a diluted bleach solution (e.g., dilute bleach with 1 part bleach and 10 parts water) or a household disinfectant with a label that says EPA-registered for coronaviruses. To make a bleach   solution at home, add 1 tablespoon of bleach to 1 quart (4 cups) of water. For a larger supply, add  cup of bleach to 1 gallon (16 cups) of water. Read labels of cleaning products and follow recommendations provided on product labels. Labels contain instructions for safe and effective use of the cleaning product including precautions you should take when applying the product, such as wearing gloves or eye protection and making sure you have good ventilation  during use of the product. Remove gloves and wash hands immediately after cleaning.  Monitor yourself for signs and symptoms of illness Caregivers and household members are considered close contacts, should monitor their health, and will be asked to limit movement outside of the home to the extent possible. Follow the monitoring steps for close contacts listed on the symptom monitoring form.   ? If you have additional questions, contact your local health department or call the epidemiologist on call at [phone] (available 24/7). ? This guidance is subject to change. For the most up-to-date guidance from [facility], please refer to their website: [email or web address]

## 2021-05-17 NOTE — Progress Notes (Signed)
MyChart Video Visit    Virtual Visit via Video Note   This visit type was conducted due to national recommendations for restrictions regarding the COVID-19 Pandemic (e.g. social distancing) in an effort to limit this patient's exposure and mitigate transmission in our community. This patient is at least at moderate risk for complications without adequate follow up. This format is felt to be most appropriate for this patient at this time. Physical exam was limited by quality of the video and audio technology used for the visit. CMA was able to get the patient set up on a video visit.  Patient location: Home. Patient and provider in visit Provider location: Office  I discussed the limitations of evaluation and management by telemedicine and the availability of in person appointments. The patient expressed understanding and agreed to proceed.  Visit Date: 05/17/2021  Today's healthcare provider: Wellington Hampshire., MD     Subjective:    Patient ID: Sara Medina, female    DOB: Apr 10, 1930, 87 y.o.   MRN: [id number]  Chief Complaint  Patient presents with   Cough    Productive cough Chest hurts when coughing Started yesterday    Hoarse    Started 4 days ago    Sore Throat    Negative covid test 3 days ago, was tested again [facility] at [facility] living [facility]     HPI-Daughter tested pt for covid 3 days ago was patient had a little bit of hoarseness.  The test was negative.  She then developed a sore throat.  Yesterday, started with a productive cough, chest hurting when coughing, a little tightness in the chest but not really short of breath.  She has used an inhaler in the past, but is not sure where it is.  No fevers or chills, but just tired.  Throat hurts a lot. Routinely, she is tested weekly at the facility she lives at.  This was done this morning, but they do not know the results as of yet.  Daughter does not have any more home COVID test to be able to test her and get a quicker  result.  She is willing to bring her to the facility to be tested on her part.  COVID test turned out positive.  We discussed the pros and cons of medications.  Given her age, history of asthma, other comorbid conditions, we decided to treat.  She does have a chronic history of intermittent diarrhea which is unchanged.  Past Medical History:  Diagnosis Date   ALLERGIC RHINITIS 05/04/2010   Qualifier: Diagnosis of  By: Jimmye Norman, LPN, [place]    Allergy    Arthritis    Asthma, exercise induced 08/18/2010    She uses the Proventil inhaler on rare occasions not on a daily basis    Chest tightness or pressure 05/13/2013   COLONIC POLYPS 05/04/2010   Qualifier: Diagnosis of  By: Jimmye Norman, LPN, [place] Cunas    Cystocele 10/02/2014   Depression    mood disorder listed after off HRT   DIVERTICULITIS, HX OF 05/04/2010   Qualifier: Diagnosis of  By: Jimmye Norman LPN, [place] Cunas    Essential hypertension 05/04/2010   Qualifier: Diagnosis of  By: Jimmye Norman LPN, [place] Cunas    GERD 05/04/2010   Qualifier: Diagnosis of  By: Jimmye Norman LPN, [place] Cunas    History of diverticulitis of colon    HYPERLIPIDEMIA 05/04/2010   Qualifier: Diagnosis of  By: Jimmye Norman, LPN, [place] Cunas    Hypertension    Hypothyroidism  05/04/2010   Qualifier: Diagnosis of  By: Jimmye Norman, LPN, [place] Cunas    Menopausal disorder 08/18/2010   When she went off the HRT she had severe mood disorder    Mitral valve prolapse    Neuropathy    Osteoarthritis 05/04/2010   Qualifier: Diagnosis of  By: Jimmye Norman, LPN, [place] Cunas    Palpitations 06/30/2014   Pneumonia    Spinal stenosis    Thyroid disease     Past Surgical History:  Procedure Laterality Date   ABDOMINAL HYSTERECTOMY     BACK SURGERY     CATARACT EXTRACTION Bilateral    cataract surgery     COLONOSCOPY     EYE SURGERY     LAMINECTOMY WITH POSTERIOR LATERAL ARTHRODESIS LEVEL 1 N/A 01/27/2021   Procedure: Laminectomy and Foraminotomy - Lumbar Three-Four/Lumbar Four-Five, posterior  fusion with fixation Lumbar Four-Five;  Surgeon: Eustace Moore, MD;  Location: [place];  Service: Neurosurgery;  Laterality: N/A;   SHOULDER SURGERY Right    TONSILLECTOMY     trigger thumb Right    TUBAL LIGATION     WISDOM TOOTH EXTRACTION     x4    Outpatient Medications Prior to Visit  Medication Sig Dispense Refill   amLODipine (NORVASC) 5 MG tablet TAKE ONE TABLET BY MOUTH DAILY 90 tablet 3   CALCIUM PO Take 1 capsule by mouth daily.     Cholecalciferol (VITAMIN D-3) 25 MCG (1000 UT) CAPS Take 1,000 Units by mouth daily.     donepezil (ARICEPT) 5 MG tablet Take 1 tablet (5 mg total) by mouth at bedtime. 30 tablet 6   EPINEPHrine 0.3 mg/0.3 mL IJ SOAJ injection Inject 0.3 mg into the muscle as needed for anaphylaxis.      gabapentin (NEURONTIN) 100 MG capsule Take 4 capsules (400 mg total) by mouth at bedtime. 360 capsule 11   levothyroxine (SYNTHROID) 25 MCG tablet TAKE ONE TABLET BY MOUTH DAILY BEFORE BREAKFAST 90 tablet 3   metoprolol tartrate (LOPRESSOR) 25 MG tablet TAKE TWO TABLETS BY MOUTH EVERY MORNING AND TAKE ONE TABLET BY MOUTH EVERY EVENING 270 tablet 3   OVER THE COUNTER MEDICATION Apply 1 application topically at bedtime. Topricin foot cream     Probiotic Product (ALIGN) 4 MG CAPS Take 4 mg by mouth daily.     vitamin B-12 (CYANOCOBALAMIN) 500 MCG tablet Take 500 mcg by mouth 2 (two) times daily.     Facility-Administered Medications Prior to Visit  Medication Dose Route Frequency Provider Last Rate Last Admin   cloNIDine (CATAPRES) tablet 0.1 mg  0.1 mg Oral Once Nafziger, Tommi Rumps, NP        Allergies  Allergen Reactions   Other Anaphylaxis, Shortness Of Breath, Swelling and Rash    ALL NUTS!!!!   Peanut-Containing Drug Products Anaphylaxis, Shortness Of Breath, Swelling and Rash   Pecan Nut (Diagnostic) Anaphylaxis, Shortness Of Breath and Rash   Penicillins Swelling    Full facial swelling Did it involve swelling of the face/tongue/throat, SOB, or low BP?  Yes Did it involve sudden or severe rash/hives, skin peeling, or any reaction on the inside of your mouth or nose? Yes Did you need to seek medical attention at a hospital or doctor's office? No When did it last happen? "many years ago" If all above answers are "NO", may proceed with cephalosporin use.         Objective:     Physical Exam Vitals and nursing note reviewed.  Constitutional:  General: She is not in acute distress.  But mildly ill and congested.    Appearance: Normal appearance.  HENT:     Head: Normocephalic.  Pulmonary:     Effort: No respiratory distress.  Skin:    General: Skin is dry.     Coloration: Skin is not pale.  Neurological:     Mental Status: She is alert and oriented to person, place, and time.  Psychiatric:        Mood and Affect: Mood normal.   Ht 5\' 4"  (1.626 m)    Wt 139 lb 9.6 oz (63.3 kg)    BMI 23.96 kg/m   Wt Readings from Last 3 Encounters:  05/17/21 139 lb 9.6 oz (63.3 kg)  03/10/21 139 lb 9.6 oz (63.3 kg)  02/18/21 132 lb 3.2 oz (60 kg)       Results for orders placed or performed in visit on 05/17/21  POC COVID-19  Result Value Ref Range   SARS Coronavirus 2 Ag Positive (A) Negative    Assessment & Plan:   Problem List Items Addressed This Visit   None Visit Diagnoses     Acute cough    -  Primary   Relevant Orders   POC COVID-19 (Completed)   COVID-19       Relevant Medications   molnupiravir EUA (LAGEVRIO) 200 mg CAPS capsule     1.  COVID-due to age and comorbidities, we decided to treat with molnupiravir.  Side effects, risk versus benefit were discussed.  We will also renew her albuterol.  She does have Mucinex and other cough remedies available at home.  Discussed quarantine etc. if a lot worse, trouble breathing then go to the emergency room  Meds ordered this encounter  Medications   albuterol (VENTOLIN HFA) 108 (90 Base) MCG/ACT inhaler    Sig: Inhale 2 puffs into the lungs every 6 (six) hours as  needed for wheezing or shortness of breath.    Dispense:  8 g    Refill:  0   molnupiravir EUA (LAGEVRIO) 200 mg CAPS capsule    Sig: Take 4 capsules (800 mg total) by mouth 2 (two) times daily for 5 days.    Dispense:  40 capsule    Refill:  0    I discussed the assessment and treatment plan with the patient. The patient was provided an opportunity to ask questions and all were answered. The patient agreed with the plan and demonstrated an understanding of the instructions.   The patient was advised to call back or seek an in-person evaluation if the symptoms worsen or if the condition fails to improve as anticipated.  I provided 20 minutes of face-to-face time during this encounter.   Wellington Hampshire., MD [place] [phone] (phone) [phone] (fax)  [place]

## 2021-05-31 DIAGNOSIS — Z20822 Contact with and (suspected) exposure to covid-19: Secondary | ICD-10-CM | POA: Diagnosis not present

## 2021-06-07 DIAGNOSIS — Z20822 Contact with and (suspected) exposure to covid-19: Secondary | ICD-10-CM | POA: Diagnosis not present

## 2021-06-24 ENCOUNTER — Other Ambulatory Visit: Payer: Self-pay

## 2021-06-24 ENCOUNTER — Encounter: Payer: Self-pay | Admitting: Family Medicine

## 2021-06-24 ENCOUNTER — Ambulatory Visit (INDEPENDENT_AMBULATORY_CARE_PROVIDER_SITE_OTHER): Payer: Medicare Other | Admitting: Family Medicine

## 2021-06-24 VITALS — BP 138/78 | HR 68 | Temp 97.5°F | Wt 143.3 lb

## 2021-06-24 DIAGNOSIS — Z1283 Encounter for screening for malignant neoplasm of skin: Secondary | ICD-10-CM | POA: Diagnosis not present

## 2021-06-24 DIAGNOSIS — R232 Flushing: Secondary | ICD-10-CM

## 2021-06-24 MED ORDER — CITALOPRAM HYDROBROMIDE 10 MG PO TABS: 10.0000 mg | ORAL_TABLET | Freq: Every day | ORAL | 3 refills | Status: DC

## 2021-06-24 NOTE — Progress Notes (Signed)
Phone [phone] In person visit   Subjective:   Sara Medina is a 88 y.o. year old very pleasant female patient who presents for/with See problem oriented charting Chief Complaint  Patient presents with   Hot Flashes    And insomnia since stopping estrogen a couple years ago   This visit occurred during the SARS-CoV-2 public health emergency.  Safety protocols were in place, including screening questions prior to the visit, additional usage of staff PPE, and extensive cleaning of exam room while observing appropriate contact time as indicated for disinfecting solutions.   Past Medical History-  Patient Active Problem List   Diagnosis Date Noted   Chronic diarrhea 12/30/2020    Priority: High   Burning sensation of feet 06/21/2020    Priority: Medium    Palpitations 06/30/2014    Priority: Medium    Asthma, exercise induced 08/18/2010    Priority: Medium    Hypothyroidism 05/04/2010    Priority: Medium    Hyperlipidemia, unspecified 05/04/2010    Priority: Medium    Essential hypertension 05/04/2010    Priority: Medium    GERD 05/04/2010    Priority: Medium    Cystocele 10/02/2014    Priority: Low   COLONIC POLYPS 05/04/2010    Priority: Low   ALLERGIC RHINITIS 05/04/2010    Priority: Low   Osteoarthritis 05/04/2010    Priority: Low   DIVERTICULITIS, HX OF 05/04/2010    Priority: Low   S/P lumbar fusion 01/27/2021    Medications- reviewed and updated Current Outpatient Medications  Medication Sig Dispense Refill   albuterol (VENTOLIN HFA) 108 (90 Base) MCG/ACT inhaler Inhale 2 puffs into the lungs every 6 (six) hours as needed for wheezing or shortness of breath. 8 g 0   amLODipine (NORVASC) 5 MG tablet TAKE ONE TABLET BY MOUTH DAILY 90 tablet 3   CALCIUM PO Take 1 capsule by mouth daily.     Cholecalciferol (VITAMIN D-3) 25 MCG (1000 UT) CAPS Take 1,000 Units by mouth daily.     citalopram (CELEXA) 10 MG tablet Take 1 tablet (10 mg total) by mouth daily. 30  tablet 3   donepezil (ARICEPT) 5 MG tablet Take 1 tablet (5 mg total) by mouth at bedtime. 30 tablet 6   gabapentin (NEURONTIN) 100 MG capsule Take 4 capsules (400 mg total) by mouth at bedtime. 360 capsule 11   levothyroxine (SYNTHROID) 25 MCG tablet TAKE ONE TABLET BY MOUTH DAILY BEFORE BREAKFAST 90 tablet 3   metoprolol tartrate (LOPRESSOR) 25 MG tablet TAKE TWO TABLETS BY MOUTH EVERY MORNING AND TAKE ONE TABLET BY MOUTH EVERY EVENING 270 tablet 3   Probiotic Product (ALIGN) 4 MG CAPS Take 4 mg by mouth daily.     vitamin B-12 (CYANOCOBALAMIN) 500 MCG tablet Take 500 mcg by mouth 2 (two) times daily.     EPINEPHrine 0.3 mg/0.3 mL IJ SOAJ injection Inject 0.3 mg into the muscle as needed for anaphylaxis.      OVER THE COUNTER MEDICATION Apply 1 application topically at bedtime. Topricin foot cream (Patient not taking: Reported on 06/24/2021)     Current Facility-Administered Medications  Medication Dose Route Frequency Provider Last Rate Last Admin   cloNIDine (CATAPRES) tablet 0.1 mg  0.1 mg Oral Once Nafziger, Tommi Rumps, NP         Objective:  BP 138/78 (BP Location: Left Arm, Patient Position: Sitting)    Pulse 68    Temp (!) 97.5 F (36.4 C) (Tympanic)    Wt 143 lb  4.8 oz (65 kg)    SpO2 97%    BMI 24.60 kg/m  Gen: NAD, resting comfortably     Assessment and Plan   #Hot flashes S: Patient stopped estrogen several years ago- she had been on this for many years and physician thought she should stop to reduce risk.  Unfortunately she has redeveloped hot flashes and insomnia since that time. Worse at night. She thinks the hot flashes are disrupting her sleep. does watch tv up to bedtime - hot flashes are so severe has cussed at times -History-patient without history of heart disease or breast cancer or history of DVT or PE -Has hysterectomy so not at increased risk for uterine cancer A/P: Patient with ongoing significant hot flashes since estradiol 0.5 mg daily was stopped years ago.  We  discussed potential risks particular heart disease at her age (no uterus so does not increase uterine cancer risk).  I discussed I thought it was with good reason that she had been stopped on medication previously.  Quality of life is very important to her and even with CV risk she very strongly considered restarting estradiol - Discussed nonhormonal therapies such as Paxil or Celexa as well.  After reviewing these 2 options she was concerned about anticholinergic risks with Paxil and prefer to stop Celexa (does not have prolonged QT interval).  Reviewed potential side effects and if she is not tolerating the can try Paxil 7.5 mg before bed prior to follow-up in a few weeks.  She still seems to be strongly considering estradiol balancing risks with potential increased quality of life but was willing to give nonhormonal therapy to try  #History of COVID-19-recovered from covid over christmas-denied lingering symptoms  #Screening for skin cancer-patient has a few skin lesions she would like to have looked at by dermatology-none seemed acutely concerning-referral was placed to dermatology    Recommended follow up: Return for next already scheduled visit. Future Appointments  Date Time Provider [place]  07/13/2021 11:20 AM Marin Olp, MD [facility]  09/07/2021  1:00 PM Hazle Coca, PhD [facility] None  09/07/2021  2:00 PM [facility] NEUROPSYCH TECH [facility] None  09/14/2021  2:30 PM Hazle Coca, PhD [facility] None  05/05/2022  2:30 PM [facility] HEALTH COACH [facility]    Lab/Order associations:   ICD-10-CM   1. Hot flashes  R23.2     2. Skin cancer screening  Z12.83 Ambulatory referral to Dermatology    CANCELED: Ambulatory referral to Dermatology     Meds ordered this encounter  Medications   citalopram (CELEXA) 10 MG tablet    Sig: Take 1 tablet (10 mg total) by mouth daily.    Dispense:  30 tablet    Refill:  3    Time Spent: 41 minutes of total time (1:28- 2:03 PM, 8:02  PM-8:08 PM) was spent on the date of the encounter performing the following actions: chart review prior to seeing the patient, obtaining history, performing a medically necessary exam, counseling on the treatment plan, placing orders, and documenting in our EHR.   Return precautions advised.  Garret Reddish, MD

## 2021-06-24 NOTE — Patient Instructions (Addendum)
Trial low dose citalopram 10 mg to see if helps with hot flashes Side effects (stomach upset, some increased anxiety) may happen before you notice a benefit.  These side effects typically go away over time. Most people will notice an improvement within two weeks but the full effect of the medication can take up to 4-6 weeks If you start having thoughts of hurting yourself or others after starting this medicine, call our office immediately at [phone] or seek care through 911.    We will call you within two weeks about your referral to dermatology. If you do not hear within 2 weeks, give [place] a call.    Recommended follow up: Return for next already scheduled visit.

## 2021-07-07 NOTE — Progress Notes (Signed)
Phone [phone] In person visit   Subjective:   Sara Medina is a 88 y.o. year old very pleasant female patient who presents for/with See problem oriented charting Chief Complaint  Patient presents with   Follow-up    Would like do discus Donepezil b/c it is causing black stool.   Hypertension   Hypothyroidism   Hyperlipidemia   Back Pain    Would like  pt referral instead of medications.   This visit occurred during the SARS-CoV-2 public health emergency.  Safety protocols were in place, including screening questions prior to the visit, additional usage of staff PPE, and extensive cleaning of exam room while observing appropriate contact time as indicated for disinfecting solutions.   Past Medical History-  Patient Active Problem List   Diagnosis Date Noted   Chronic diarrhea 12/30/2020    Priority: High   Burning sensation of feet 06/21/2020    Priority: Medium    Palpitations 06/30/2014    Priority: Medium    Asthma, exercise induced 08/18/2010    Priority: Medium    Hypothyroidism 05/04/2010    Priority: Medium    Hyperlipidemia, unspecified 05/04/2010    Priority: Medium    Essential hypertension 05/04/2010    Priority: Medium    GERD 05/04/2010    Priority: Medium    Cystocele 10/02/2014    Priority: Low   COLONIC POLYPS 05/04/2010    Priority: Low   ALLERGIC RHINITIS 05/04/2010    Priority: Low   Osteoarthritis 05/04/2010    Priority: Low   DIVERTICULITIS, HX OF 05/04/2010    Priority: Low   S/P lumbar fusion 01/27/2021    Medications- reviewed and updated Current Outpatient Medications  Medication Sig Dispense Refill   albuterol (VENTOLIN HFA) 108 (90 Base) MCG/ACT inhaler Inhale 2 puffs into the lungs every 6 (six) hours as needed for wheezing or shortness of breath. 8 g 0   amLODipine (NORVASC) 5 MG tablet TAKE ONE TABLET BY MOUTH DAILY 90 tablet 3   CALCIUM PO Take 1 capsule by mouth daily.     Cholecalciferol (VITAMIN D-3) 25 MCG (1000 UT)  CAPS Take 1,000 Units by mouth daily.     citalopram (CELEXA) 10 MG tablet Take 1 tablet (10 mg total) by mouth daily. 30 tablet 3   EPINEPHrine 0.3 mg/0.3 mL IJ SOAJ injection Inject 0.3 mg into the muscle as needed for anaphylaxis.      gabapentin (NEURONTIN) 100 MG capsule Take 4 capsules (400 mg total) by mouth at bedtime. 360 capsule 11   levothyroxine (SYNTHROID) 25 MCG tablet TAKE ONE TABLET BY MOUTH DAILY BEFORE BREAKFAST 90 tablet 3   metoprolol tartrate (LOPRESSOR) 25 MG tablet TAKE TWO TABLETS BY MOUTH EVERY MORNING AND TAKE ONE TABLET BY MOUTH EVERY EVENING 270 tablet 3   OVER THE COUNTER MEDICATION Apply 1 application topically at bedtime. Topricin foot cream     Probiotic Product (ALIGN) 4 MG CAPS Take 4 mg by mouth daily.     vitamin B-12 (CYANOCOBALAMIN) 500 MCG tablet Take 500 mcg by mouth 2 (two) times daily.     No current facility-administered medications for this visit.     Objective:  BP 122/70    Pulse (!) 56    Temp (!) 97.1 F (36.2 C)    Ht 5\' 4"  (1.626 m)    Wt 142 lb 9.6 oz (64.7 kg)    SpO2 96%    BMI 24.48 kg/m  Gen: NAD, resting comfortably CV: RRR no murmurs  rubs or gallops as not bradycardic on my exam Lungs: CTAB no crackles, wheeze, rhonchi Ext: Minimal edema Skin: warm, dry Neuro: Hard of hearing    Assessment and Plan   #Hot flashes S: From last assessment and plan "Patient with ongoing significant hot flashes since estradiol 0.5 mg daily was stopped years ago.  We discussed potential risks particular heart disease at her age (no uterus so does not increase uterine cancer risk).  I discussed I thought it was with good reason that she had been stopped on medication previously.  Quality of life is very important to her and even with CV risk she very strongly considered restarting estradiol - Discussed nonhormonal therapies such as Paxil or Celexa as well.  After reviewing these 2 options she was concerned about anticholinergic risks with Paxil and  prefer to stop Celexa (does not have prolonged QT interval).  Reviewed potential side effects and if she is not tolerating the can try Paxil 7.5 mg before bed prior to follow-up in a few weeks.  She still seems to be strongly considering estradiol balancing risks with potential increased quality of life but was willing to give nonhormonal therapy to try"  Today she reports -improvement with the citalopram 10 mg- no known side effects A/P:  improved control with citalopram 10 mg- continue current medications. Still having sleep issues  # insomnia S:she is having trouble going to sleep about every other night. Days she doesn't sleep well she is exhausted and by the end of the day can fall asleep. Issues started within a few months- used to be an excellent sleeper.  Unclear trigger.  -possible triggers- not exercising due to back pain and donepezil -has limited screen time on a few nights but not consistently yet A/P: Very poor sleep typically every other night but with some poor sleep hygiene  From AVS:  " Avoid screen time 30 mins before bed. Need a consistent committed bedtime.  When you stop watching the screen- take around 1 mg of melatonin (can cut 5 mg in fourths or 3 mg in half) Update me in 1 week if sleep has improved with this.   If this doesn't work may try trazodone- reach out "  #dark stools-patient reports dark black stools as well as diarrhea. This was started in October and had issues since 2-3 weeks ago. Stopped 3 days ago and symptoms resolved.  Lab Results  Component Value Date   WBC 6.9 03/15/2021   HGB 11.7 (L) 03/15/2021   HCT 35.4 (L) 03/15/2021   MCV 92.6 03/15/2021   PLT 292.0 03/15/2021  -We discussed the importance of making sure she does not have progressive anemia as well as making sure there is no blood in stool- she agrees   # Back Pain S: reports ongoing issues with back pain. Had been worsening. Had been given some pain killers after prior surgery- she  tried old pain medicines-tramadol- only took one dose and had improvement. Pain was up to 7/10 and down to 0/10 with medicine. Did PT previously and wants to restart with worsening of pain- did through [facility]  ROS-No saddle anesthesia, bladder incontinence, fecal incontinence, weakness in extremity, numbness or tingling in extremity. History negative for trauma, , pain worse at night or while supine.   A/P: history of low back pain but with recnet worsening- will refer back for PT at [facility]- I would be open to refilling tramadol for sparing use if needed in future.   #hypertension S: medication:  amlodipine 5 mg daily, and metoprolol tartrate 25 mg twice daily BP Readings from Last 3 Encounters:  07/13/21 122/70  06/24/21 138/78  03/10/21 (!) 141/69  A/P: Controlled. Continue current medications.   #hypothyroidism S: compliant On thyroid medication- levothyroxine 25 mcg daily   Lab Results  Component Value Date   TSH 2.051 02/12/2021   A/P:Reasonable control on last check-continue current medication  #hyperlipidemia S: Medication:None  Lab Results  Component Value Date   CHOL 227 (H) 08/22/2019   HDL 68 08/22/2019   LDLCALC 116 (H) 08/22/2019   LDLDIRECT 162.3 03/18/2013   TRIG 251 (H) 08/22/2019   CHOLHDL 3.3 08/22/2019   A/P: Over age 31 typically would not start statin for primary prevention-continue to monitor without medicine- considered updating today bu tnot fasting- will do in future with trglycerides high  Recommended follow up: No follow-ups on file. Future Appointments  Date Time Provider [place]  09/07/2021  1:00 PM Hazle Coca, PhD [facility] None  09/07/2021  2:00 PM [facility]- NEUROPSYCH TECH [facility] None  09/14/2021  2:30 PM Hazle Coca, PhD [facility] None  05/05/2022  2:30 PM [facility] HEALTH COACH [facility]    Lab/Order associations: not fasting- had boost   ICD-10-CM   1. Hot flashes  R23.2     2. Essential hypertension  I10     3.  Hyperlipidemia, unspecified hyperlipidemia type  E78.5     4. Hypothyroidism, unspecified type  E03.9     5. Melena  K92.1 CBC with Differential/Platelet    Fecal occult blood, imunochemical    6. Chronic bilateral low back pain without sciatica  M54.50    G89.29       No orders of the defined types were placed in this encounter.  I,Jada Bradford,acting as a scribe for Garret Reddish, MD.,have documented all relevant documentation on the behalf of Garret Reddish, MD,as directed by  Garret Reddish, MD while in the presence of Garret Reddish, MD.  I, Garret Reddish, MD, have reviewed all documentation for this visit. The documentation on 07/13/21 for the exam, diagnosis, procedures, and orders are all accurate and complete.   Return precautions advised.  Garret Reddish, MD

## 2021-07-13 ENCOUNTER — Encounter: Payer: Self-pay | Admitting: Family Medicine

## 2021-07-13 ENCOUNTER — Other Ambulatory Visit: Payer: Self-pay

## 2021-07-13 ENCOUNTER — Ambulatory Visit (INDEPENDENT_AMBULATORY_CARE_PROVIDER_SITE_OTHER): Payer: Medicare Other | Admitting: Family Medicine

## 2021-07-13 VITALS — BP 122/70 | HR 56 | Temp 97.1°F | Ht 64.0 in | Wt 142.6 lb

## 2021-07-13 DIAGNOSIS — K921 Melena: Secondary | ICD-10-CM | POA: Diagnosis not present

## 2021-07-13 DIAGNOSIS — I10 Essential (primary) hypertension: Secondary | ICD-10-CM

## 2021-07-13 DIAGNOSIS — R232 Flushing: Secondary | ICD-10-CM

## 2021-07-13 DIAGNOSIS — M545 Low back pain, unspecified: Secondary | ICD-10-CM | POA: Diagnosis not present

## 2021-07-13 DIAGNOSIS — G8929 Other chronic pain: Secondary | ICD-10-CM

## 2021-07-13 DIAGNOSIS — E039 Hypothyroidism, unspecified: Secondary | ICD-10-CM | POA: Diagnosis not present

## 2021-07-13 DIAGNOSIS — E785 Hyperlipidemia, unspecified: Secondary | ICD-10-CM | POA: Diagnosis not present

## 2021-07-13 LAB — CBC WITH DIFFERENTIAL/PLATELET
Basophils Absolute: 0 10*3/uL (ref 0.0–0.1)
Basophils Relative: 0.5 % (ref 0.0–3.0)
Eosinophils Absolute: 0.1 10*3/uL (ref 0.0–0.7)
Eosinophils Relative: 1.1 % (ref 0.0–5.0)
HCT: 37.3 % (ref 36.0–46.0)
Hemoglobin: 12.3 g/dL (ref 12.0–15.0)
Lymphocytes Relative: 15.1 % (ref 12.0–46.0)
Lymphs Abs: 1.2 10*3/uL (ref 0.7–4.0)
MCHC: 33.1 g/dL (ref 30.0–36.0)
MCV: 93.9 fl (ref 78.0–100.0)
Monocytes Absolute: 0.5 10*3/uL (ref 0.1–1.0)
Monocytes Relative: 6.3 % (ref 3.0–12.0)
Neutro Abs: 6 10*3/uL (ref 1.4–7.7)
Neutrophils Relative %: 77 % (ref 43.0–77.0)
Platelets: 300 10*3/uL (ref 150.0–400.0)
RBC: 3.97 Mil/uL (ref 3.87–5.11)
RDW: 13.9 % (ref 11.5–15.5)
WBC: 7.8 10*3/uL (ref 4.0–10.5)

## 2021-07-13 LAB — TSH: TSH: 4.35 u[IU]/mL (ref 0.35–5.50)

## 2021-07-13 NOTE — Patient Instructions (Addendum)
Bring back stool cards when able  Please stop by lab before you go If you have mychart- we will send your results within 3 business days of [place] receiving them.  If you do not have mychart- we will call you about results within 5 business days of [place] receiving them.  *please also note that you will see labs on mychart as soon as they post. I will later go in and write notes on them- will say "notes from Dr. Yong Channel"   Avoid screen time 30 mins before bed. Need a consistent committed bedtime.  When you stop watching the screen- take around 1 mg of melatonin (can cut 5 mg in fourths or 3 mg in half) Update me in 1 week if sleep has improved with this.   If this doesn't work may try trazodone- reach out  Recommended follow up: Return in about 2 months (around 09/10/2021).

## 2021-07-14 ENCOUNTER — Encounter: Payer: Self-pay | Admitting: Family Medicine

## 2021-07-14 ENCOUNTER — Other Ambulatory Visit: Payer: Self-pay

## 2021-07-14 DIAGNOSIS — M545 Low back pain, unspecified: Secondary | ICD-10-CM

## 2021-07-14 DIAGNOSIS — G8929 Other chronic pain: Secondary | ICD-10-CM

## 2021-07-14 LAB — COMPREHENSIVE METABOLIC PANEL
ALT: 13 U/L (ref 0–35)
AST: 15 U/L (ref 0–37)
Albumin: 4.2 g/dL (ref 3.5–5.2)
Alkaline Phosphatase: 59 U/L (ref 39–117)
BUN: 22 mg/dL (ref 6–23)
CO2: 24 mEq/L (ref 19–32)
Calcium: 9.5 mg/dL (ref 8.4–10.5)
Chloride: 101 mEq/L (ref 96–112)
Creatinine, Ser: 1.15 mg/dL (ref 0.40–1.20)
GFR: 42.25 mL/min — ABNORMAL LOW (ref >60.00)
Glucose, Bld: 109 mg/dL — ABNORMAL HIGH (ref 70–99)
Potassium: 4.4 mEq/L (ref 3.5–5.1)
Sodium: 134 mEq/L — ABNORMAL LOW (ref 135–145)
Total Bilirubin: 0.4 mg/dL (ref 0.2–1.2)
Total Protein: 6.6 g/dL (ref 6.0–8.3)

## 2021-07-15 ENCOUNTER — Ambulatory Visit: Payer: Medicare Other | Admitting: Family Medicine

## 2021-07-16 ENCOUNTER — Other Ambulatory Visit (INDEPENDENT_AMBULATORY_CARE_PROVIDER_SITE_OTHER): Payer: Medicare Other

## 2021-07-16 DIAGNOSIS — K921 Melena: Secondary | ICD-10-CM | POA: Diagnosis not present

## 2021-07-16 LAB — FECAL OCCULT BLOOD, IMMUNOCHEMICAL: Fecal Occult Bld: NEGATIVE

## 2021-07-20 ENCOUNTER — Other Ambulatory Visit: Payer: Self-pay | Admitting: Neurology

## 2021-07-20 ENCOUNTER — Telehealth: Payer: Self-pay | Admitting: Family Medicine

## 2021-07-20 MED ORDER — GABAPENTIN 300 MG PO CAPS: 900.0000 mg | ORAL_CAPSULE | Freq: Every day | ORAL | 11 refills | Status: DC

## 2021-07-20 NOTE — Telephone Encounter (Signed)
We received a refill request from Sara Medina for Gabapentin 100 mg for 9 per day per pt request. I called the pt's daughter, Sara Medina, and she said the patient increased to 800 mg at night (gradually) right after the last conversation in December. She states the patient has been doing well on this dose. They requested 9 tablets per day (just in case) but she normally takes 8 at night. Will discuss with Dr Jaynee Eagles.

## 2021-07-20 NOTE — Telephone Encounter (Addendum)
I called pts daughter , Sara Medina, and I relayed that Dr. Jaynee Eagles ok to increase dose, to 900mg  po qhs  but would like to use 300mg  tabs or caps.  This will decrease # of tabs she would take at a time.  Daughter verbalized understanding. Krist[facility] Oppenheim at [facility]. This was done by Dr. Jaynee Eagles.

## 2021-07-20 NOTE — Telephone Encounter (Signed)
It appears there is a phone call out to Dr. Rickard Rhymes she is the prescribing provider I would recommend following through with that pathway

## 2021-07-20 NOTE — Telephone Encounter (Signed)
Pt's daughter Jeani Hawking called stating that she needs Dr. Yong Channel to send in a refill/increase of Gabapentin for the pt. Jeani Hawking states that Dr. Yong Channel increased the amount Pt can take to up to 8 per day, but pt will run out of med on tomorrow. Jeani Hawking wants refill sent today. Please call Jeani Hawking at the number on file.

## 2021-07-20 NOTE — Telephone Encounter (Signed)
See below, this was also sent to Dr. Jaynee Eagles today as it looks like he made the increase not you.

## 2021-07-22 ENCOUNTER — Other Ambulatory Visit: Payer: Self-pay

## 2021-07-22 MED ORDER — CITALOPRAM HYDROBROMIDE 10 MG PO TABS: 10.0000 mg | ORAL_TABLET | Freq: Every day | ORAL | 3 refills | Status: DC

## 2021-07-23 DIAGNOSIS — M5459 Other low back pain: Secondary | ICD-10-CM | POA: Diagnosis not present

## 2021-07-23 DIAGNOSIS — M6281 Muscle weakness (generalized): Secondary | ICD-10-CM | POA: Diagnosis not present

## 2021-07-28 DIAGNOSIS — M6281 Muscle weakness (generalized): Secondary | ICD-10-CM | POA: Diagnosis not present

## 2021-07-28 DIAGNOSIS — M5459 Other low back pain: Secondary | ICD-10-CM | POA: Diagnosis not present

## 2021-07-30 DIAGNOSIS — M6281 Muscle weakness (generalized): Secondary | ICD-10-CM | POA: Diagnosis not present

## 2021-07-30 DIAGNOSIS — M5459 Other low back pain: Secondary | ICD-10-CM | POA: Diagnosis not present

## 2021-08-04 DIAGNOSIS — M5459 Other low back pain: Secondary | ICD-10-CM | POA: Diagnosis not present

## 2021-08-04 DIAGNOSIS — M6281 Muscle weakness (generalized): Secondary | ICD-10-CM | POA: Diagnosis not present

## 2021-08-06 DIAGNOSIS — M6281 Muscle weakness (generalized): Secondary | ICD-10-CM | POA: Diagnosis not present

## 2021-08-06 DIAGNOSIS — M5459 Other low back pain: Secondary | ICD-10-CM | POA: Diagnosis not present

## 2021-08-09 DIAGNOSIS — M5459 Other low back pain: Secondary | ICD-10-CM | POA: Diagnosis not present

## 2021-08-09 DIAGNOSIS — M6281 Muscle weakness (generalized): Secondary | ICD-10-CM | POA: Diagnosis not present

## 2021-08-09 NOTE — Progress Notes (Incomplete)
Phone [phone] In person visit   Subjective:   Sara Medina is a 88 y.o. year old very pleasant female patient who presents for/with See problem oriented charting No chief complaint on file.   This visit occurred during the SARS-CoV-2 public health emergency.  Safety protocols were in place, including screening questions prior to the visit, additional usage of staff PPE, and extensive cleaning of exam room while observing appropriate contact time as indicated for disinfecting solutions.   Past Medical History-  Patient Active Problem List   Diagnosis Date Noted   S/P lumbar fusion 01/27/2021   Chronic diarrhea 12/30/2020   Burning sensation of feet 06/21/2020   Cystocele 10/02/2014   Palpitations 06/30/2014   Asthma, exercise induced 08/18/2010   COLONIC POLYPS 05/04/2010   Hypothyroidism 05/04/2010   Hyperlipidemia, unspecified 05/04/2010   Essential hypertension 05/04/2010   ALLERGIC RHINITIS 05/04/2010   GERD 05/04/2010   Osteoarthritis 05/04/2010   DIVERTICULITIS, HX OF 05/04/2010    Medications- reviewed and updated Current Outpatient Medications  Medication Sig Dispense Refill   albuterol (VENTOLIN HFA) 108 (90 Base) MCG/ACT inhaler Inhale 2 puffs into the lungs every 6 (six) hours as needed for wheezing or shortness of breath. 8 g 0   amLODipine (NORVASC) 5 MG tablet TAKE ONE TABLET BY MOUTH DAILY 90 tablet 3   CALCIUM PO Take 1 capsule by mouth daily.     Cholecalciferol (VITAMIN D-3) 25 MCG (1000 UT) CAPS Take 1,000 Units by mouth daily.     citalopram (CELEXA) 10 MG tablet Take 1 tablet (10 mg total) by mouth daily. 30 tablet 3   EPINEPHrine 0.3 mg/0.3 mL IJ SOAJ injection Inject 0.3 mg into the muscle as needed for anaphylaxis.      gabapentin (NEURONTIN) 300 MG capsule Take 3 capsules (900 mg total) by mouth at bedtime. 90 capsule 11   levothyroxine (SYNTHROID) 25 MCG tablet TAKE ONE TABLET BY MOUTH DAILY BEFORE BREAKFAST 90 tablet 3   metoprolol  tartrate (LOPRESSOR) 25 MG tablet TAKE TWO TABLETS BY MOUTH EVERY MORNING AND TAKE ONE TABLET BY MOUTH EVERY EVENING 270 tablet 3   OVER THE COUNTER MEDICATION Apply 1 application topically at bedtime. Topricin foot cream     Probiotic Product (ALIGN) 4 MG CAPS Take 4 mg by mouth daily.     vitamin B-12 (CYANOCOBALAMIN) 500 MCG tablet Take 500 mcg by mouth 2 (two) times daily.     No current facility-administered medications for this visit.     Objective:  There were no vitals taken for this visit. Gen: NAD, resting comfortably CV: RRR no murmurs rubs or gallops Lungs: CTAB no crackles, wheeze, rhonchi Abdomen: soft/nontender/nondistended/normal bowel sounds. No rebound or guarding.  Ext: no edema Skin: warm, dry Neuro: grossly normal, moves all extremities  ***    Assessment and Plan   #Hot flashes S: From last assessment and plan "Patient with ongoing significant hot flashes since estradiol 0.5 mg daily was stopped years ago.  We discussed potential risks particular heart disease at her age (no uterus so does not increase uterine cancer risk).  I discussed I thought it was with good reason that she had been stopped on medication previously.  Quality of life is very important to her and even with CV risk she very strongly considered restarting estradiol - Discussed nonhormonal therapies such as Paxil or Celexa as well.  After reviewing these 2 options she was concerned about anticholinergic risks with Paxil and prefer to stop Celexa (does  not have prolonged QT interval).  Reviewed potential side effects and if she is not tolerating the can try Paxil 7.5 mg before bed prior to follow-up in a few weeks.  She still seemed to be strongly considering estradiol balancing risks with potential increased quality of life but was willing to give nonhormonal therapy to try"   She reported 06/2021 visit-improvement with the citalopram 10 mg- no known side effects  A/P:***   # insomnia S:she was  having trouble going to sleep about every other night. Days she doesn't sleep well she is exhausted and by the end of the day can fall asleep. Issues started within a few months- used to be an excellent sleeper.  Unclear trigger.  -possible triggers- not exercising due to back pain and donepezil -had limited screen time on a few nights but not consistently yet A/P: ***   #dark stools-patient reported dark black stools as well as diarrhea. This was started in October 2022.Symptoms resolved.  --We discussed the importance of making sure she does not have progressive anemia as well as making sure there is no blood in stool- she agreed    # Back Pain S: Pt reported ongoing issues with back pain. Had been worsened. been given some pain killers after prior surgery- she tried old pain medicines-tramadol- only took one dose and had improvement. Pain was up to 7/10 and down to 0/10 with medicine. Did PT previously and wanted to restart with worsened of pain- did through [facility]   ROS-No saddle anesthesia, bladder incontinence, fecal incontinence, weakness in extremity, numbness or tingling in extremity. History negative for trauma, , pain worse at night or while supine.   A/P: ***   #hypertension S: medication: amlodipine 5 mg daily, and metoprolol tartrate 25 mg twice daily Home readings #s: *** BP Readings from Last 3 Encounters:  07/13/21 122/70  06/24/21 138/78  03/10/21 (!) 141/69  A/P: ***  #hypothyroidism S: compliant On thyroid medication- levothyroxine 25 mcg daily   Lab Results  Component Value Date   TSH 4.35 07/13/2021    A/P:***    #hyperlipidemia S: Medication:None  Lab Results  Component Value Date   CHOL 227 (H) 08/22/2019   HDL 68 08/22/2019   LDLCALC 116 (H) 08/22/2019   LDLDIRECT 162.3 03/18/2013   TRIG 251 (H) 08/22/2019   CHOLHDL 3.3 08/22/2019   A/P: ***  There are no preventive care reminders to display for this patient.  Recommended follow up: No  follow-ups on file. Future Appointments  Date Time Provider [place]  09/07/2021  1:00 PM Hazle Coca, PhD [facility] None  09/07/2021  2:00 PM [facility] TE[facility] None  09/14/2021  2:30 PM Hazle Coca, PhD [facility] None  09/27/2021 11:20 AM Marin Olp, MD [facility]  05/05/2022  2:30 PM [facility] HEALTH COACH [facility]    Lab/Order associations: No diagnosis found.  No orders of the defined types were placed in this encounter.   Return precautions advised.  Burnett Corrente

## 2021-08-11 DIAGNOSIS — M5459 Other low back pain: Secondary | ICD-10-CM | POA: Diagnosis not present

## 2021-08-11 DIAGNOSIS — M6281 Muscle weakness (generalized): Secondary | ICD-10-CM | POA: Diagnosis not present

## 2021-08-16 ENCOUNTER — Telehealth: Payer: Self-pay | Admitting: *Deleted

## 2021-08-16 NOTE — Telephone Encounter (Signed)
Sara Medina received a refill request from [facility] for Donepezil 5 mg tablet 90 day supply. Last refill noted to be on 08/06/21. Upon review of chart, this medication was discontinued on 07/13/21 by Dr Yong Channel. Insomnia noted on chart. Will contact MD to see if pt should remain off this.  ? ?

## 2021-08-16 NOTE — Telephone Encounter (Signed)
Sara Medina team-can you call and check on this.  I believe I may have erroneously removed the donepezil-please check and see with her and family if she thinks this was causing sleep issues-if not can certainly be refilled by Dr. Britt Boozer apologies if this was removed in error ?

## 2021-08-17 DIAGNOSIS — M5459 Other low back pain: Secondary | ICD-10-CM | POA: Diagnosis not present

## 2021-08-17 DIAGNOSIS — M6281 Muscle weakness (generalized): Secondary | ICD-10-CM | POA: Diagnosis not present

## 2021-08-17 NOTE — Telephone Encounter (Signed)
Pt called back for keba -  ?

## 2021-08-17 NOTE — Telephone Encounter (Signed)
Called and lm for pt tcb. 

## 2021-08-17 NOTE — Telephone Encounter (Signed)
Pt daughter Sara Medina called back and states that pt is not supposed to be taking this any longer due to it causing issues with her stool so they have stopped giving this to her and it no longer needs to be refilled.  ?

## 2021-08-18 NOTE — Telephone Encounter (Signed)
Ok thank you for the update.

## 2021-08-20 DIAGNOSIS — M5459 Other low back pain: Secondary | ICD-10-CM | POA: Diagnosis not present

## 2021-08-20 DIAGNOSIS — M6281 Muscle weakness (generalized): Secondary | ICD-10-CM | POA: Diagnosis not present

## 2021-08-27 DIAGNOSIS — M6281 Muscle weakness (generalized): Secondary | ICD-10-CM | POA: Diagnosis not present

## 2021-08-27 DIAGNOSIS — M5459 Other low back pain: Secondary | ICD-10-CM | POA: Diagnosis not present

## 2021-08-30 DIAGNOSIS — M5459 Other low back pain: Secondary | ICD-10-CM | POA: Diagnosis not present

## 2021-08-30 DIAGNOSIS — N3946 Mixed incontinence: Secondary | ICD-10-CM | POA: Diagnosis not present

## 2021-08-30 DIAGNOSIS — R278 Other lack of coordination: Secondary | ICD-10-CM | POA: Diagnosis not present

## 2021-08-30 DIAGNOSIS — R41841 Cognitive communication deficit: Secondary | ICD-10-CM | POA: Diagnosis not present

## 2021-08-30 DIAGNOSIS — M6281 Muscle weakness (generalized): Secondary | ICD-10-CM | POA: Diagnosis not present

## 2021-09-02 DIAGNOSIS — M5459 Other low back pain: Secondary | ICD-10-CM | POA: Diagnosis not present

## 2021-09-02 DIAGNOSIS — N3946 Mixed incontinence: Secondary | ICD-10-CM | POA: Diagnosis not present

## 2021-09-02 DIAGNOSIS — R278 Other lack of coordination: Secondary | ICD-10-CM | POA: Diagnosis not present

## 2021-09-02 DIAGNOSIS — M6281 Muscle weakness (generalized): Secondary | ICD-10-CM | POA: Diagnosis not present

## 2021-09-02 DIAGNOSIS — R41841 Cognitive communication deficit: Secondary | ICD-10-CM | POA: Diagnosis not present

## 2021-09-06 ENCOUNTER — Encounter: Payer: Self-pay | Admitting: Psychology

## 2021-09-06 DIAGNOSIS — I341 Nonrheumatic mitral (valve) prolapse: Secondary | ICD-10-CM | POA: Insufficient documentation

## 2021-09-06 DIAGNOSIS — G629 Polyneuropathy, unspecified: Secondary | ICD-10-CM | POA: Insufficient documentation

## 2021-09-06 NOTE — Progress Notes (Addendum)
? ?NEUROPSYCHOLOGICAL EVALUATION ?[place] ?[place] Department of Neurology ? ?Date of Evaluation: September 07, 2021 ? ?Reason for Referral:  ? ?Sara Medina is a 88 y.o. right-handed Caucasian female referred by  Sarina Ill, M.D. , to characterize her current cognitive functioning and assist with diagnostic clarity and treatment planning in the context of subjective cognitive decline and concern for the presence of a neurodegenerative illness.  ? ?Assessment and Plan:  ? ?Clinical Impression(s): ?[name]'s pattern of performance is suggestive of severe and fairly diffuse cognitive impairment relative to age-matched peers. Performances across basic attention and on a task used to assess safety and judgment scored adequately. She also performed adequately across yes/no recognition trials across memory tasks. However, significant impairment was seen across all other assessed cognitive domains. This included processing speed, cognitive flexibility, expressive language, visuospatial abilities, and both encoding (i.e., learning) and delayed retrieval aspects of memory. Regarding ADLs, both her daughters report that them taking over financial and medication management responsibilities was out of necessity and I do not believe that [name] has adequate insight into her true cognitive and functional limitations. Given ongoing cognitive and functional impairment, she certainly meets diagnostic criteria for a Major Neurocognitive Disorder ("dementia") at the present time. ? ?The etiology of her dementia presentation is difficult to discern given the fairly diffuse pattern of impairment with minimal patterns of performance to analyze. Given the extent of impairment and her family's report of progressive decline over time, the presence of a neurodegenerative illness is highly likely. Given population base rates, Alzheimer's disease represents the most common neurodegenerative illness. [name] does  exhibit evidence for rapid forgetting, along with impairments in confrontation naming, semantic fluency, and visuospatial abilities. Her family's report of memory dysfunction being the primary observed symptom over time, as well as her diminished insight into true impairment, also aligns with this illness quite well. Curiously, she was able to demonstrate some memory storage abilities across yes/no recognition tasks. While this could be due to happenstance guessing, this would have to be demonstrated across three separate tasks, which seems unlikely. This would not suggest a strong memory storage deficit at the present time. While this finding would not rule out Alzheimer's disease as the primary cause, it would suggest a somewhat atypical presentation at the present time. ? ?Outside of Alzheimer's disease, I cannot rule out a Lewy body dementia presentation. [name] did exhibit pronounced visuospatial deficits, as well as notable difficulties with assessed executive functioning (i.e., cognitive flexibility). Memory impairment but with some intact recognition performances would also align with this illness, as would impairments with processing speed and expressive language. With that being said, she does not demonstrate strong parkinsonian features (she did report subtle to mild tremors in her hands while at rest; however, these were not observed during the current evaluation) or exhibit REM sleep behaviors. While there was some report of potential fully-formed visual hallucinations, these were said to be infrequent. Despite this, not all individuals with Lewy body dementia will exhibit behavioral features commonly seen in this illness, making this plausible. Lewy body disease and Alzheimer's disease also co-occur fairly often, raising the potential for a mixed dementia presentation.  ? ?[name] does not demonstrate behavioral changes consistent with frontotemporal dementia. Recent neuroimaging did not suggest  vascular changes sufficient to warrant consideration for a vascular dementia presentation. ? ?Overall, [name] clearly meets diagnostic criteria for dementia. The underlying etiology most likely represents either an atypical presentation of Alzheimer's disease, a  presentation of Lewy body dementia without common behavioral symptoms, or a combination of said etiologies. Continued medical monitoring will be important moving forward. ? ?Recommendations: ?Ms. Leisure could speak with Dr. Jaynee Eagles regarding medication aimed to address memory loss and concerns for an underlying neurodegenerative illness. It is important to highlight that these medications have been shown to slow functional decline in some individuals. There is no current treatment which can stop or reverse cognitive decline when caused by a neurodegenerative illness. ? ?Should there be a desire to maintain her current (albeit new) living situation, I would recommend that [name]'s daughters look through her various health insurance benefits to see what could be covered/allowed as far as at-home health aides and ongoing dementia-level care. ? ?It is my opinion that, given ongoing impairment, [name] would certainly benefit from placement within an assisted living facility or memory care unit. This will undoubtedly be a difficult decision for her and her family to make. However, this would ensure that she is consistently afforded a level of care and monitoring to promote ongoing safety and medical management. Should [name]'s daughters feel that they are unable to provide a level of care to ensure safety, that feeling/concern is one of the better indicators that increased care would be best. I hope that this documentation will assist her and her family in this regard.  ? ?It will be important for [name] to have another person with her when in situations where she may need to process information, weigh the pros and cons of different options, and make  decisions, in order to ensure that she fully understands and recalls all information to be considered. ? ?If not already done, Ms. Jaworowski and her family may want to discuss her wishes regarding durable power of attorney and medical decision making, so that she can have input into these choices. ? ?Ms. Blunck is encouraged to attend to lifestyle factors for brain health (e.g., regular physical exercise, good nutrition habits, regular participation in cognitively-stimulating activities, and general stress management techniques), which are likely to have benefits for both emotional adjustment and cognition. Optimal control of vascular risk factors (including safe cardiovascular exercise and adherence to dietary recommendations) is encouraged. Continued participation in activities which provide mental stimulation and social interaction is also recommended.  ? ?I would also encourage her and her family to more closely examine ongoing alcohol intake. The CDC describes "heavy drinking" as 8 more more alcoholic beverages (which means 8 or more standard glasses of wine) in a week for women. Chronic heavy drinking will not only negatively impact her overall health, but can also negatively impact cognitive abilities.  ? ?Important information should be provided to Ms. Coates in written format in all instances. This information should be placed in a highly frequented and easily visible location within her home to promote recall. External strategies such as written notes in a consistently used memory journal, visual and nonverbal auditory cues such as a calendar on the refrigerator or appointments with alarm, such as on a cell phone, can also help maximize recall. ? ?Review of Records:  ? ?Ms. Kowaleski was seen by [facility] Sarina Ill, M.D.) on 03/10/2021 for an evaluation of memory loss. Approximately five years prior, Ms. Goins described some trouble keeping up while playing cards. Her daughter noted Ms.  Mabe struggling to keep dates and times straight in her mind. She noted trouble concentrating and performing multi-step actions. She also described a instance about three months prior to this  appointment

## 2021-09-07 ENCOUNTER — Ambulatory Visit: Payer: Medicare Other | Admitting: Psychology

## 2021-09-07 ENCOUNTER — Encounter: Payer: Self-pay | Admitting: Psychology

## 2021-09-07 ENCOUNTER — Ambulatory Visit (INDEPENDENT_AMBULATORY_CARE_PROVIDER_SITE_OTHER): Payer: Medicare Other | Admitting: Psychology

## 2021-09-07 DIAGNOSIS — F03B0 Unspecified dementia, moderate, without behavioral disturbance, psychotic disturbance, mood disturbance, and anxiety: Secondary | ICD-10-CM

## 2021-09-07 DIAGNOSIS — R4189 Other symptoms and signs involving cognitive functions and awareness: Secondary | ICD-10-CM

## 2021-09-07 HISTORY — DX: Unspecified dementia, moderate, without behavioral disturbance, psychotic disturbance, mood disturbance, and anxiety: F03.B0

## 2021-09-07 NOTE — Progress Notes (Signed)
? ?  Psychometrician Note ?  ?Cognitive testing was administered to Sara Medina by Milana Kidney, B.S. (psychometrist) under the supervision of Dr. Christia Reading, Ph.D., licensed psychologist on 09/07/2021. Sara Medina did not appear overtly distressed by the testing session per behavioral observation or responses across self-report questionnaires. Rest breaks were offered.  ?  ?The battery of tests administered was selected by Dr. Christia Reading, Ph.D. with consideration to Sara Medina's current level of functioning, the nature of her symptoms, emotional and behavioral responses during interview, level of literacy, observed level of motivation/effort, and the nature of the referral question. This battery was communicated to the psychometrist. Communication between Dr. Christia Reading, Ph.D. and the psychometrist was ongoing throughout the evaluation and Dr. Christia Reading, Ph.D. was immediately accessible at all times. Dr. Christia Reading, Ph.D. provided supervision to the psychometrist on the date of this service to the extent necessary to assure the quality of all services provided.  ?  ?Sara Medina will return within approximately 1-2 weeks for an interactive feedback session with Dr. Melvyn Novas at which time her test performances, clinical impressions, and treatment recommendations will be reviewed in detail. Sara Medina understands she can contact our office should she require our assistance before this time. ? ?A total of 110 minutes of billable time were spent face-to-face with Sara Medina by the psychometrist. This includes both test administration and scoring time. Billing for these services is reflected in the clinical report generated by Dr. Christia Reading, Ph.D. ? ?This note reflects time spent with the psychometrician and does not include test scores or any clinical interpretations made by Dr. Melvyn Novas. The full report will follow in a separate note.  ?

## 2021-09-08 ENCOUNTER — Ambulatory Visit (INDEPENDENT_AMBULATORY_CARE_PROVIDER_SITE_OTHER): Payer: Medicare Other | Admitting: Family Medicine

## 2021-09-08 ENCOUNTER — Encounter: Payer: Self-pay | Admitting: Family Medicine

## 2021-09-08 ENCOUNTER — Encounter: Payer: Self-pay | Admitting: Psychology

## 2021-09-08 VITALS — BP 128/64 | HR 95 | Temp 98.1°F | Ht 64.0 in | Wt 142.6 lb

## 2021-09-08 DIAGNOSIS — R058 Other specified cough: Secondary | ICD-10-CM | POA: Diagnosis not present

## 2021-09-08 DIAGNOSIS — R82998 Other abnormal findings in urine: Secondary | ICD-10-CM | POA: Diagnosis not present

## 2021-09-08 LAB — POC URINALSYSI DIPSTICK (AUTOMATED)
Bilirubin, UA: NEGATIVE
Blood, UA: NEGATIVE
Glucose, UA: NEGATIVE
Ketones, UA: NEGATIVE
Leukocytes, UA: NEGATIVE
Nitrite, UA: NEGATIVE
Protein, UA: NEGATIVE
Spec Grav, UA: 1.01 (ref 1.010–1.025)
Urobilinogen, UA: 0.2 E.U./dL
pH, UA: 6 (ref 5.0–8.0)

## 2021-09-08 MED ORDER — DOXYCYCLINE HYCLATE 100 MG PO TABS: 100.0000 mg | ORAL_TABLET | Freq: Two times a day (BID) | ORAL | 0 refills | Status: DC

## 2021-09-08 MED ORDER — NITROFURANTOIN MONOHYD MACRO 100 MG PO CAPS: 100.0000 mg | ORAL_CAPSULE | Freq: Two times a day (BID) | ORAL | 0 refills | Status: DC

## 2021-09-08 NOTE — Progress Notes (Signed)
? ?Subjective:  ? ? ? Patient ID: Sara Medina, female    DOB: 1930/04/12, 88 y.o.   MRN: [id number] ? ?Chief Complaint  ?Patient presents with  ? dark urine  ?  Pt c/o dark urine x1 week but drinks a lot of water and denies pain with urination. She does have nausea and decreased appetire with "thick spit"  ? ? ?HPI-here w/dau Jeani Hawking.  Lives in independent living w/occ caregiver ?Dark urine for 1-2 wk. Drinks water all the time.  Very thirsty esp first am. No dysuria.  Dec appetite. ?Bad taste in mouth and thick spit-clearing throat and brings it up.  More sob than usu. No f/c.    More confused than usual. ?+ nausea.  No vomiting. No const/diarrhea.  Last bm this am.  ? ?Does take pantaprazole daily-but not on list.   ? ?There are no preventive care reminders to display for this patient. ? ?Past Medical History:  ?Diagnosis Date  ? Allergic rhinitis 05/04/2010  ? Asthma, exercise induced 08/18/2010  ?  She uses the Proventil inhaler on rare occasions not on a daily basis   ? Benign neoplasm of colon 05/04/2010  ? Burning sensation of feet 06/21/2020  ? Chest tightness or pressure 05/13/2013  ? Chronic diarrhea 12/30/2020  ? Cystocele 10/02/2014  ? Essential hypertension 05/04/2010  ? GERD (gastroesophageal reflux disease) 05/04/2010  ? Pantoprazole 40- down to 20 for diarrhea   ? History of diverticulitis 05/04/2010  ? Hyperlipidemia 05/04/2010  ? Hypothyroidism 05/04/2010  ? Major depressive disorder   ? Menopausal disorder 08/18/2010  ? When she went off the HRT she had severe mood disorder   ? Mitral valve prolapse   ? Moderate dementia, without behavioral disturbance 09/07/2021  ? Neuropathy   ? Osteoarthritis 05/04/2010  ? Palpitations 06/30/2014  ? Pneumonia   ? S/P lumbar fusion 01/27/2021  ? Spinal stenosis   ? ? ?Past Surgical History:  ?Procedure Laterality Date  ? ABDOMINAL HYSTERECTOMY    ? BACK SURGERY    ? CATARACT EXTRACTION Bilateral   ? cataract surgery    ? COLONOSCOPY    ? EYE SURGERY    ? LAMINECTOMY  WITH POSTERIOR LATERAL ARTHRODESIS LEVEL 1 N/A 01/27/2021  ? Procedure: Laminectomy and Foraminotomy - Lumbar Three-Four/Lumbar Four-Five, posterior fusion with fixation Lumbar Four-Five;  Surgeon: Eustace Moore, MD;  Location: [facility];  Service: Neurosurgery;  Laterality: N/A;  ? SHOULDER SURGERY Right   ? TONSILLECTOMY    ? trigger thumb Right   ? TUBAL LIGATION    ? WISDOM TOOTH EXTRACTION    ? x4  ? ? ?Outpatient Medications Prior to Visit  ?Medication Sig Dispense Refill  ? albuterol (VENTOLIN HFA) 108 (90 Base) MCG/ACT inhaler Inhale 2 puffs into the lungs every 6 (six) hours as needed for wheezing or shortness of breath. 8 g 0  ? amLODipine (NORVASC) 5 MG tablet TAKE ONE TABLET BY MOUTH DAILY 90 tablet 3  ? CALCIUM PO Take 1 capsule by mouth daily.    ? Cholecalciferol (VITAMIN D-3) 25 MCG (1000 UT) CAPS Take 1,000 Units by mouth daily.    ? citalopram (CELEXA) 10 MG tablet Take 1 tablet (10 mg total) by mouth daily. 30 tablet 3  ? EPINEPHrine 0.3 mg/0.3 mL IJ SOAJ injection Inject 0.3 mg into the muscle as needed for anaphylaxis.     ? gabapentin (NEURONTIN) 300 MG capsule Take 3 capsules (900 mg total) by mouth at bedtime. 90 capsule 11  ?  levothyroxine (SYNTHROID) 25 MCG tablet TAKE ONE TABLET BY MOUTH DAILY BEFORE BREAKFAST 90 tablet 3  ? metoprolol tartrate (LOPRESSOR) 25 MG tablet TAKE TWO TABLETS BY MOUTH EVERY MORNING AND TAKE ONE TABLET BY MOUTH EVERY EVENING 270 tablet 3  ? OVER THE COUNTER MEDICATION Apply 1 application topically at bedtime. Topricin foot cream    ? pantoprazole (PROTONIX) 40 MG tablet Take 40 mg by mouth daily.    ? Probiotic Product (ALIGN) 4 MG CAPS Take 4 mg by mouth daily.    ? vitamin B-12 (CYANOCOBALAMIN) 500 MCG tablet Take 500 mcg by mouth 2 (two) times daily.    ? omeprazole (PRILOSEC) 20 MG capsule Take 20 mg by mouth daily.    ? ?No facility-administered medications prior to visit.  ? ? ?Allergies  ?Allergen Reactions  ? Other Anaphylaxis, Shortness Of Breath, Swelling  and Rash  ?  ALL NUTS!!!!  ? Peanut-Containing Drug Products Anaphylaxis, Shortness Of Breath, Swelling and Rash  ? Pecan Nut (Diagnostic) Anaphylaxis, Shortness Of Breath and Rash  ? Penicillins Swelling  ?  Full facial swelling ?Did it involve swelling of the face/tongue/throat, SOB, or low BP? Yes ?Did it involve sudden or severe rash/hives, skin peeling, or any reaction on the inside of your mouth or nose? Yes ?Did you need to seek medical attention at a hospital or doctor's office? No ?When did it last happen? "many years ago" ?If all above answers are "NO", may proceed with cephalosporin use. ?  ? ?ROS neg/noncontributory except as noted HPI/below ? ? ?   ?Objective:  ?  ? ?BP 128/64   Pulse 95   Temp 98.1 ?F (36.7 ?C)   Ht '5\' 4"'$  (1.626 m)   Wt 142 lb 9.6 oz (64.7 kg)   SpO2 99%   BMI 24.48 kg/m?  ?Wt Readings from Last 3 Encounters:  ?09/08/21 142 lb 9.6 oz (64.7 kg)  ?07/13/21 142 lb 9.6 oz (64.7 kg)  ?06/24/21 143 lb 4.8 oz (65 kg)  ? ? ?Physical Exam  ? ?Gen: WDWN NAD elderly female ?HEENT: NCAT, conjunctiva not injected, sclera nonicteric ?TM WNL B, OP moist, no exudates    Hearing aids.  Clearing throat occ ?NECK:  supple, no thyromegaly, no nodes, no carotid bruits ?CARDIAC: RRR, S1S2+, 1/6 murmur.  ?LUNGS: CTAB. No wheezes ?ABDOMEN:  BS+, soft, sl tender suprapubically.  No HSM, no masses. No CVAT ?EXT:  no edema ?MSK: no gross abnormalities.  ?NEURO: A&O .  CN II-XII intact.  ?PSYCH: normal mood. Good eye contact ? ?Reviewed labs from 07/13/21 ? ?Results for orders placed or performed in visit on 09/08/21  ?POCT Urinalysis Dipstick (Automated)  ?Result Value Ref Range  ? Color, UA yellow   ? Clarity, UA clear   ? Glucose, UA Negative Negative  ? Bilirubin, UA neg   ? Ketones, UA neg   ? Spec Grav, UA 1.010 1.010 - 1.025  ? Blood, UA neg   ? pH, UA 6.0 5.0 - 8.0  ? Protein, UA Negative Negative  ? Urobilinogen, UA 0.2 0.2 or 1.0 E.U./dL  ? Nitrite, UA neg   ? Leukocytes, UA Negative Negative  ?  ?    ?Assessment & Plan:  ? ?Problem List Items Addressed This Visit   ?None ?Visit Diagnoses   ? ? Dark urine    -  Primary  ? Relevant Orders  ? POCT Urinalysis Dipstick (Automated)  ? Urine Culture  ? Other cough      ? ?  ?  Dark urine, more confusion-?UTI, other.  Pt is drinking plenty of water.  Will do macrobid(I was concerned w/keflex d/t rxn w/pcn).  Await cx-  called daughter [name]-will not do the macrobid.   ?Cough-clearing throat-? If from aging, GERD uncontrolled.  Inc protonix to BID.  F/u if cont-has appt 5/1.  The lungs may or may not be an issue-may all be gerd, but as not feeling well, will do doxy ? ?Spent 35 min w/pt as harder to get hx, set answer,discussing plan.  ? ?Meds ordered this encounter  ?Medications  ? nitrofurantoin, macrocrystal-monohydrate, (MACROBID) 100 MG capsule  ?  Sig: Take 1 capsule (100 mg total) by mouth 2 (two) times daily.  ?  Dispense:  14 capsule  ?  Refill:  0  ? ? ?Wellington Hampshire, MD ? ?

## 2021-09-08 NOTE — Patient Instructions (Signed)
It was very nice to see you today!  Increase pantaprazole to twice daily   PLEASE NOTE:  If you had any lab tests please let us know if you have not heard back within a few days. You may see your results on MyChart before we have a chance to review them but we will give you a call once they are reviewed by us. If we ordered any referrals today, please let us know if you have not heard from their office within the next week.   Please try these tips to maintain a healthy lifestyle:  Eat most of your calories during the day when you are active. Eliminate processed foods including packaged sweets (pies, cakes, cookies), reduce intake of potatoes, white bread, white pasta, and white rice. Look for whole grain options, oat flour or almond flour.  Each meal should contain half fruits/vegetables, one quarter protein, and one quarter carbs (no bigger than a computer mouse).  Cut down on sweet beverages. This includes juice, soda, and sweet tea. Also watch fruit intake, though this is a healthier sweet option, it still contains natural sugar! Limit to 3 servings daily.  Drink at least 1 glass of water with each meal and aim for at least 8 glasses per day  Exercise at least 150 minutes every week.   

## 2021-09-09 ENCOUNTER — Telehealth: Payer: Self-pay | Admitting: Family Medicine

## 2021-09-09 DIAGNOSIS — M6281 Muscle weakness (generalized): Secondary | ICD-10-CM | POA: Diagnosis not present

## 2021-09-09 DIAGNOSIS — M5459 Other low back pain: Secondary | ICD-10-CM | POA: Diagnosis not present

## 2021-09-09 DIAGNOSIS — R41841 Cognitive communication deficit: Secondary | ICD-10-CM | POA: Diagnosis not present

## 2021-09-09 DIAGNOSIS — N3946 Mixed incontinence: Secondary | ICD-10-CM | POA: Diagnosis not present

## 2021-09-09 DIAGNOSIS — R278 Other lack of coordination: Secondary | ICD-10-CM | POA: Diagnosis not present

## 2021-09-09 NOTE — Telephone Encounter (Signed)
Returned the call to [place] and VO was give for OT,  PT and ST. Will await fax and resend back to her. ?

## 2021-09-09 NOTE — Telephone Encounter (Signed)
Kelsie with [employer] stated there was an error on the order. They are needing a call with the VO and then the signed orders to be faxed back over. Please call [phone]. I have placed the fax with the updated order in the provider's box ?

## 2021-09-10 ENCOUNTER — Other Ambulatory Visit: Payer: Self-pay

## 2021-09-10 DIAGNOSIS — M5459 Other low back pain: Secondary | ICD-10-CM | POA: Diagnosis not present

## 2021-09-10 DIAGNOSIS — N3946 Mixed incontinence: Secondary | ICD-10-CM | POA: Diagnosis not present

## 2021-09-10 DIAGNOSIS — R41841 Cognitive communication deficit: Secondary | ICD-10-CM | POA: Diagnosis not present

## 2021-09-10 DIAGNOSIS — M6281 Muscle weakness (generalized): Secondary | ICD-10-CM | POA: Diagnosis not present

## 2021-09-10 DIAGNOSIS — R278 Other lack of coordination: Secondary | ICD-10-CM | POA: Diagnosis not present

## 2021-09-10 LAB — URINE CULTURE
MICRO NUMBER:: 13253898
SPECIMEN QUALITY:: ADEQUATE

## 2021-09-10 MED ORDER — NITROFURANTOIN MONOHYD MACRO 100 MG PO CAPS: 100.0000 mg | ORAL_CAPSULE | Freq: Two times a day (BID) | ORAL | 0 refills | Status: DC

## 2021-09-12 ENCOUNTER — Other Ambulatory Visit: Payer: Self-pay | Admitting: Family Medicine

## 2021-09-12 MED ORDER — CIPROFLOXACIN HCL 500 MG PO TABS: 500.0000 mg | ORAL_TABLET | Freq: Two times a day (BID) | ORAL | 0 refills | Status: DC

## 2021-09-13 ENCOUNTER — Telehealth: Payer: Self-pay | Admitting: Family Medicine

## 2021-09-13 NOTE — Telephone Encounter (Signed)
Spoke with Sara Medina, went over lab results and recommendations. Sara Medina verbalized understanding.  ?

## 2021-09-13 NOTE — Telephone Encounter (Signed)
Jeani Hawking, daughter of pt, authorized by [facility].  ? ?Called to ask questions about Rx on antibiotic and lab results. ? ?Please call back, asap. ?

## 2021-09-14 ENCOUNTER — Ambulatory Visit (INDEPENDENT_AMBULATORY_CARE_PROVIDER_SITE_OTHER): Payer: Medicare Other | Admitting: Psychology

## 2021-09-14 DIAGNOSIS — F03B0 Unspecified dementia, moderate, without behavioral disturbance, psychotic disturbance, mood disturbance, and anxiety: Secondary | ICD-10-CM | POA: Diagnosis not present

## 2021-09-14 DIAGNOSIS — N3946 Mixed incontinence: Secondary | ICD-10-CM | POA: Diagnosis not present

## 2021-09-14 DIAGNOSIS — M5459 Other low back pain: Secondary | ICD-10-CM | POA: Diagnosis not present

## 2021-09-14 DIAGNOSIS — M6281 Muscle weakness (generalized): Secondary | ICD-10-CM | POA: Diagnosis not present

## 2021-09-14 DIAGNOSIS — R41841 Cognitive communication deficit: Secondary | ICD-10-CM | POA: Diagnosis not present

## 2021-09-14 DIAGNOSIS — R278 Other lack of coordination: Secondary | ICD-10-CM | POA: Diagnosis not present

## 2021-09-14 NOTE — Progress Notes (Signed)
? ?  Neuropsychology Feedback Session ?[facility] ?[place] Department of Neurology ? ?Reason for Referral:  ? ?Sara Medina is a 88 y.o. right-handed Caucasian female referred by  Sarina Ill, M.D. , to characterize her current cognitive functioning and assist with diagnostic clarity and treatment planning in the context of subjective cognitive decline and concern for the presence of a neurodegenerative illness.  ? ?Feedback:  ? ?[name] completed a comprehensive neuropsychological evaluation on 09/07/2021. Please refer to that encounter for the full report and recommendations. Briefly, results suggested severe and fairly diffuse cognitive impairment relative to age-matched peers. Performances across basic attention and on a task used to assess safety and judgment scored adequately. She also performed adequately across yes/no recognition trials across memory tasks. However, significant impairment was seen across all other assessed cognitive domains. Overall, [name] clearly meets diagnostic criteria for dementia. The underlying etiology most likely represents either an atypical presentation of Alzheimer's disease, a presentation of Lewy body dementia without common behavioral symptoms, or a combination of said etiologies. Continued medical monitoring will be important moving forward. ? ?[name] was accompanied by her two daughters during the current feedback session. Content of the current session focused on the results of her neuropsychological evaluation. [name] was given the opportunity to ask questions and her questions were answered. She was encouraged to reach out should additional questions arise. A copy of her report was provided at the conclusion of the visit.  ? ?  ? ?30 minutes were spent conducting the current feedback session with Ms. Nurse, billed as one unit [phone].  ?

## 2021-09-16 DIAGNOSIS — M5459 Other low back pain: Secondary | ICD-10-CM | POA: Diagnosis not present

## 2021-09-16 DIAGNOSIS — R278 Other lack of coordination: Secondary | ICD-10-CM | POA: Diagnosis not present

## 2021-09-16 DIAGNOSIS — N3946 Mixed incontinence: Secondary | ICD-10-CM | POA: Diagnosis not present

## 2021-09-16 DIAGNOSIS — M6281 Muscle weakness (generalized): Secondary | ICD-10-CM | POA: Diagnosis not present

## 2021-09-16 DIAGNOSIS — R41841 Cognitive communication deficit: Secondary | ICD-10-CM | POA: Diagnosis not present

## 2021-09-17 ENCOUNTER — Telehealth: Payer: Self-pay | Admitting: Family Medicine

## 2021-09-17 DIAGNOSIS — R41841 Cognitive communication deficit: Secondary | ICD-10-CM | POA: Diagnosis not present

## 2021-09-17 DIAGNOSIS — M5459 Other low back pain: Secondary | ICD-10-CM | POA: Diagnosis not present

## 2021-09-17 DIAGNOSIS — N3946 Mixed incontinence: Secondary | ICD-10-CM | POA: Diagnosis not present

## 2021-09-17 DIAGNOSIS — R278 Other lack of coordination: Secondary | ICD-10-CM | POA: Diagnosis not present

## 2021-09-17 DIAGNOSIS — M6281 Muscle weakness (generalized): Secondary | ICD-10-CM | POA: Diagnosis not present

## 2021-09-17 NOTE — Telephone Encounter (Signed)
Daughter called again requesting feedback on medication options.  ? ?Stated a message in [place] is acceptable.  ?Stated she really wants a response by tonight. ?

## 2021-09-17 NOTE — Telephone Encounter (Signed)
Pt daughter states pt has been on CIPRO since Sunday and is experiencing nausea and confusing/losing balance. She states she does seem much better today but she just wants to know if she can stop taking the CIPRO since it has been 5 days and if needed she can leave another urine to have it tested to make sure it is cleared up. She just did not want to have pt stop taking it without getting permission first. ?

## 2021-09-17 NOTE — Telephone Encounter (Signed)
UTI symptoms have cleared but with side effects from medicine and already having 5 days discussed with daughter and will stop medication- return if new or worsening symptoms ?

## 2021-09-17 NOTE — Telephone Encounter (Signed)
Daughter states pt has been on Cipro (04/16) for resistant UTI and seems better, but wanted to chat about side effects and their longevity. Can she stop taking the Cipro , or some other alternatives. ?

## 2021-09-20 ENCOUNTER — Encounter: Payer: Self-pay | Admitting: Family Medicine

## 2021-09-20 ENCOUNTER — Encounter: Payer: Self-pay | Admitting: Neurology

## 2021-09-20 ENCOUNTER — Telehealth: Payer: Self-pay | Admitting: Psychology

## 2021-09-20 NOTE — Telephone Encounter (Signed)
Sara Medina, Pernell's daughter called, she would like to know if Melvyn Novas would call Dr.Ahearn and let her know about the DX. She wants to go ahead and get this medicine for her mother. Ahearn doesn't have ana appt till 4-5 weeks and she doesn't want to wait that long.  ?

## 2021-09-21 DIAGNOSIS — R278 Other lack of coordination: Secondary | ICD-10-CM | POA: Diagnosis not present

## 2021-09-21 DIAGNOSIS — M6281 Muscle weakness (generalized): Secondary | ICD-10-CM | POA: Diagnosis not present

## 2021-09-21 DIAGNOSIS — R41841 Cognitive communication deficit: Secondary | ICD-10-CM | POA: Diagnosis not present

## 2021-09-21 DIAGNOSIS — M5459 Other low back pain: Secondary | ICD-10-CM | POA: Diagnosis not present

## 2021-09-21 DIAGNOSIS — N3946 Mixed incontinence: Secondary | ICD-10-CM | POA: Diagnosis not present

## 2021-09-22 DIAGNOSIS — M5459 Other low back pain: Secondary | ICD-10-CM | POA: Diagnosis not present

## 2021-09-22 DIAGNOSIS — R41841 Cognitive communication deficit: Secondary | ICD-10-CM | POA: Diagnosis not present

## 2021-09-22 DIAGNOSIS — R278 Other lack of coordination: Secondary | ICD-10-CM | POA: Diagnosis not present

## 2021-09-22 DIAGNOSIS — N3946 Mixed incontinence: Secondary | ICD-10-CM | POA: Diagnosis not present

## 2021-09-22 DIAGNOSIS — M6281 Muscle weakness (generalized): Secondary | ICD-10-CM | POA: Diagnosis not present

## 2021-09-23 MED ORDER — MEMANTINE HCL 5 MG PO TABS: 5.0000 mg | ORAL_TABLET | Freq: Two times a day (BID) | ORAL | 2 refills | Status: DC

## 2021-09-24 DIAGNOSIS — M5459 Other low back pain: Secondary | ICD-10-CM | POA: Diagnosis not present

## 2021-09-24 DIAGNOSIS — M6281 Muscle weakness (generalized): Secondary | ICD-10-CM | POA: Diagnosis not present

## 2021-09-24 DIAGNOSIS — R278 Other lack of coordination: Secondary | ICD-10-CM | POA: Diagnosis not present

## 2021-09-24 DIAGNOSIS — R41841 Cognitive communication deficit: Secondary | ICD-10-CM | POA: Diagnosis not present

## 2021-09-24 DIAGNOSIS — N3946 Mixed incontinence: Secondary | ICD-10-CM | POA: Diagnosis not present

## 2021-09-27 ENCOUNTER — Encounter: Payer: Self-pay | Admitting: Family Medicine

## 2021-09-27 ENCOUNTER — Ambulatory Visit (INDEPENDENT_AMBULATORY_CARE_PROVIDER_SITE_OTHER): Payer: Medicare Other | Admitting: Family Medicine

## 2021-09-27 VITALS — BP 138/76 | HR 56 | Temp 98.0°F | Ht 64.0 in | Wt 139.6 lb

## 2021-09-27 DIAGNOSIS — E039 Hypothyroidism, unspecified: Secondary | ICD-10-CM | POA: Diagnosis not present

## 2021-09-27 DIAGNOSIS — I10 Essential (primary) hypertension: Secondary | ICD-10-CM

## 2021-09-27 DIAGNOSIS — G8929 Other chronic pain: Secondary | ICD-10-CM

## 2021-09-27 DIAGNOSIS — E785 Hyperlipidemia, unspecified: Secondary | ICD-10-CM | POA: Diagnosis not present

## 2021-09-27 DIAGNOSIS — M545 Low back pain, unspecified: Secondary | ICD-10-CM

## 2021-09-27 MED ORDER — CITALOPRAM HYDROBROMIDE 10 MG PO TABS: 10.0000 mg | ORAL_TABLET | Freq: Every day | ORAL | 3 refills | Status: DC

## 2021-09-27 MED ORDER — BUSPIRONE HCL 5 MG PO TABS: 5.0000 mg | ORAL_TABLET | Freq: Two times a day (BID) | ORAL | 5 refills | Status: DC | PRN

## 2021-09-27 MED ORDER — TRAMADOL HCL 50 MG PO TABS: 50.0000 mg | ORAL_TABLET | Freq: Three times a day (TID) | ORAL | 1 refills | Status: DC | PRN

## 2021-09-27 NOTE — Patient Instructions (Addendum)
-   we will continue citalopram as baseline to also help with hot flashes ?-also add buspirone 5 mg twice daily as needed- for now at least next month schedule daily in the morning and can take another dose at least 6 hours later IF needed  ? ?I am happy to extend PT if reaches end of sessions and physical therapist still suggests more sessions .  ? ?Recommended follow up: Return in about 4 months (around 01/28/2022) for followup or sooner if needed.Schedule b4 you leave. ?

## 2021-09-28 DIAGNOSIS — Z20822 Contact with and (suspected) exposure to covid-19: Secondary | ICD-10-CM | POA: Diagnosis not present

## 2021-09-29 DIAGNOSIS — M6281 Muscle weakness (generalized): Secondary | ICD-10-CM | POA: Diagnosis not present

## 2021-09-29 DIAGNOSIS — R278 Other lack of coordination: Secondary | ICD-10-CM | POA: Diagnosis not present

## 2021-09-29 DIAGNOSIS — N3946 Mixed incontinence: Secondary | ICD-10-CM | POA: Diagnosis not present

## 2021-09-29 DIAGNOSIS — R41841 Cognitive communication deficit: Secondary | ICD-10-CM | POA: Diagnosis not present

## 2021-09-29 DIAGNOSIS — F02A18 Dementia in other diseases classified elsewhere, mild, with other behavioral disturbance: Secondary | ICD-10-CM | POA: Diagnosis not present

## 2021-09-30 ENCOUNTER — Telehealth: Payer: Self-pay | Admitting: Family Medicine

## 2021-09-30 DIAGNOSIS — R41841 Cognitive communication deficit: Secondary | ICD-10-CM | POA: Diagnosis not present

## 2021-09-30 DIAGNOSIS — N3946 Mixed incontinence: Secondary | ICD-10-CM | POA: Diagnosis not present

## 2021-09-30 DIAGNOSIS — Z822 Family history of deafness and hearing loss: Secondary | ICD-10-CM | POA: Diagnosis not present

## 2021-09-30 DIAGNOSIS — H903 Sensorineural hearing loss, bilateral: Secondary | ICD-10-CM | POA: Diagnosis not present

## 2021-09-30 DIAGNOSIS — Z570 Occupational exposure to noise: Secondary | ICD-10-CM | POA: Diagnosis not present

## 2021-09-30 DIAGNOSIS — R278 Other lack of coordination: Secondary | ICD-10-CM | POA: Diagnosis not present

## 2021-09-30 DIAGNOSIS — M6281 Muscle weakness (generalized): Secondary | ICD-10-CM | POA: Diagnosis not present

## 2021-09-30 DIAGNOSIS — F02A18 Dementia in other diseases classified elsewhere, mild, with other behavioral disturbance: Secondary | ICD-10-CM | POA: Diagnosis not present

## 2021-09-30 NOTE — Telephone Encounter (Signed)
Pt's daughter states they need a referral for a hearing test so medicare will cover. Test was administered on 05/05. ? ?Please send referral to [facility] ?[phone] ?

## 2021-09-30 NOTE — Telephone Encounter (Signed)
Referral has been placed. 

## 2021-10-01 DIAGNOSIS — R41841 Cognitive communication deficit: Secondary | ICD-10-CM | POA: Diagnosis not present

## 2021-10-01 DIAGNOSIS — M6281 Muscle weakness (generalized): Secondary | ICD-10-CM | POA: Diagnosis not present

## 2021-10-01 DIAGNOSIS — N3946 Mixed incontinence: Secondary | ICD-10-CM | POA: Diagnosis not present

## 2021-10-01 DIAGNOSIS — R278 Other lack of coordination: Secondary | ICD-10-CM | POA: Diagnosis not present

## 2021-10-01 DIAGNOSIS — F02A18 Dementia in other diseases classified elsewhere, mild, with other behavioral disturbance: Secondary | ICD-10-CM | POA: Diagnosis not present

## 2021-10-04 ENCOUNTER — Other Ambulatory Visit: Payer: Self-pay | Admitting: Neurology

## 2021-10-04 DIAGNOSIS — F02A18 Dementia in other diseases classified elsewhere, mild, with other behavioral disturbance: Secondary | ICD-10-CM | POA: Diagnosis not present

## 2021-10-04 DIAGNOSIS — R278 Other lack of coordination: Secondary | ICD-10-CM | POA: Diagnosis not present

## 2021-10-04 DIAGNOSIS — N3946 Mixed incontinence: Secondary | ICD-10-CM | POA: Diagnosis not present

## 2021-10-04 DIAGNOSIS — M6281 Muscle weakness (generalized): Secondary | ICD-10-CM | POA: Diagnosis not present

## 2021-10-04 DIAGNOSIS — R41841 Cognitive communication deficit: Secondary | ICD-10-CM | POA: Diagnosis not present

## 2021-10-05 DIAGNOSIS — M6281 Muscle weakness (generalized): Secondary | ICD-10-CM | POA: Diagnosis not present

## 2021-10-05 DIAGNOSIS — N3946 Mixed incontinence: Secondary | ICD-10-CM | POA: Diagnosis not present

## 2021-10-05 DIAGNOSIS — R41841 Cognitive communication deficit: Secondary | ICD-10-CM | POA: Diagnosis not present

## 2021-10-05 DIAGNOSIS — R278 Other lack of coordination: Secondary | ICD-10-CM | POA: Diagnosis not present

## 2021-10-05 DIAGNOSIS — F02A18 Dementia in other diseases classified elsewhere, mild, with other behavioral disturbance: Secondary | ICD-10-CM | POA: Diagnosis not present

## 2021-10-06 DIAGNOSIS — F02A18 Dementia in other diseases classified elsewhere, mild, with other behavioral disturbance: Secondary | ICD-10-CM | POA: Diagnosis not present

## 2021-10-06 DIAGNOSIS — N3946 Mixed incontinence: Secondary | ICD-10-CM | POA: Diagnosis not present

## 2021-10-06 DIAGNOSIS — R278 Other lack of coordination: Secondary | ICD-10-CM | POA: Diagnosis not present

## 2021-10-06 DIAGNOSIS — M6281 Muscle weakness (generalized): Secondary | ICD-10-CM | POA: Diagnosis not present

## 2021-10-06 DIAGNOSIS — R41841 Cognitive communication deficit: Secondary | ICD-10-CM | POA: Diagnosis not present

## 2021-10-11 DIAGNOSIS — M6281 Muscle weakness (generalized): Secondary | ICD-10-CM | POA: Diagnosis not present

## 2021-10-11 DIAGNOSIS — R41841 Cognitive communication deficit: Secondary | ICD-10-CM | POA: Diagnosis not present

## 2021-10-11 DIAGNOSIS — R278 Other lack of coordination: Secondary | ICD-10-CM | POA: Diagnosis not present

## 2021-10-11 DIAGNOSIS — F02A18 Dementia in other diseases classified elsewhere, mild, with other behavioral disturbance: Secondary | ICD-10-CM | POA: Diagnosis not present

## 2021-10-11 DIAGNOSIS — N3946 Mixed incontinence: Secondary | ICD-10-CM | POA: Diagnosis not present

## 2021-10-12 DIAGNOSIS — M6281 Muscle weakness (generalized): Secondary | ICD-10-CM | POA: Diagnosis not present

## 2021-10-12 DIAGNOSIS — F02A18 Dementia in other diseases classified elsewhere, mild, with other behavioral disturbance: Secondary | ICD-10-CM | POA: Diagnosis not present

## 2021-10-12 DIAGNOSIS — N3946 Mixed incontinence: Secondary | ICD-10-CM | POA: Diagnosis not present

## 2021-10-12 DIAGNOSIS — R41841 Cognitive communication deficit: Secondary | ICD-10-CM | POA: Diagnosis not present

## 2021-10-12 DIAGNOSIS — R278 Other lack of coordination: Secondary | ICD-10-CM | POA: Diagnosis not present

## 2021-10-13 DIAGNOSIS — R41841 Cognitive communication deficit: Secondary | ICD-10-CM | POA: Diagnosis not present

## 2021-10-13 DIAGNOSIS — F02A18 Dementia in other diseases classified elsewhere, mild, with other behavioral disturbance: Secondary | ICD-10-CM | POA: Diagnosis not present

## 2021-10-13 DIAGNOSIS — M6281 Muscle weakness (generalized): Secondary | ICD-10-CM | POA: Diagnosis not present

## 2021-10-13 DIAGNOSIS — R278 Other lack of coordination: Secondary | ICD-10-CM | POA: Diagnosis not present

## 2021-10-13 DIAGNOSIS — N3946 Mixed incontinence: Secondary | ICD-10-CM | POA: Diagnosis not present

## 2021-10-14 DIAGNOSIS — N3946 Mixed incontinence: Secondary | ICD-10-CM | POA: Diagnosis not present

## 2021-10-14 DIAGNOSIS — R41841 Cognitive communication deficit: Secondary | ICD-10-CM | POA: Diagnosis not present

## 2021-10-14 DIAGNOSIS — R278 Other lack of coordination: Secondary | ICD-10-CM | POA: Diagnosis not present

## 2021-10-14 DIAGNOSIS — F02A18 Dementia in other diseases classified elsewhere, mild, with other behavioral disturbance: Secondary | ICD-10-CM | POA: Diagnosis not present

## 2021-10-14 DIAGNOSIS — M6281 Muscle weakness (generalized): Secondary | ICD-10-CM | POA: Diagnosis not present

## 2021-10-15 DIAGNOSIS — Z23 Encounter for immunization: Secondary | ICD-10-CM | POA: Diagnosis not present

## 2021-10-18 DIAGNOSIS — M6281 Muscle weakness (generalized): Secondary | ICD-10-CM | POA: Diagnosis not present

## 2021-10-18 DIAGNOSIS — F02A18 Dementia in other diseases classified elsewhere, mild, with other behavioral disturbance: Secondary | ICD-10-CM | POA: Diagnosis not present

## 2021-10-18 DIAGNOSIS — R278 Other lack of coordination: Secondary | ICD-10-CM | POA: Diagnosis not present

## 2021-10-18 DIAGNOSIS — R41841 Cognitive communication deficit: Secondary | ICD-10-CM | POA: Diagnosis not present

## 2021-10-18 DIAGNOSIS — N3946 Mixed incontinence: Secondary | ICD-10-CM | POA: Diagnosis not present

## 2021-10-19 DIAGNOSIS — N3946 Mixed incontinence: Secondary | ICD-10-CM | POA: Diagnosis not present

## 2021-10-19 DIAGNOSIS — M6281 Muscle weakness (generalized): Secondary | ICD-10-CM | POA: Diagnosis not present

## 2021-10-19 DIAGNOSIS — F02A18 Dementia in other diseases classified elsewhere, mild, with other behavioral disturbance: Secondary | ICD-10-CM | POA: Diagnosis not present

## 2021-10-19 DIAGNOSIS — R278 Other lack of coordination: Secondary | ICD-10-CM | POA: Diagnosis not present

## 2021-10-19 DIAGNOSIS — R41841 Cognitive communication deficit: Secondary | ICD-10-CM | POA: Diagnosis not present

## 2021-10-20 DIAGNOSIS — N3946 Mixed incontinence: Secondary | ICD-10-CM | POA: Diagnosis not present

## 2021-10-20 DIAGNOSIS — F02A18 Dementia in other diseases classified elsewhere, mild, with other behavioral disturbance: Secondary | ICD-10-CM | POA: Diagnosis not present

## 2021-10-20 DIAGNOSIS — M6281 Muscle weakness (generalized): Secondary | ICD-10-CM | POA: Diagnosis not present

## 2021-10-20 DIAGNOSIS — R41841 Cognitive communication deficit: Secondary | ICD-10-CM | POA: Diagnosis not present

## 2021-10-20 DIAGNOSIS — R278 Other lack of coordination: Secondary | ICD-10-CM | POA: Diagnosis not present

## 2021-10-27 DIAGNOSIS — F02A18 Dementia in other diseases classified elsewhere, mild, with other behavioral disturbance: Secondary | ICD-10-CM | POA: Diagnosis not present

## 2021-10-27 DIAGNOSIS — M6281 Muscle weakness (generalized): Secondary | ICD-10-CM | POA: Diagnosis not present

## 2021-10-27 DIAGNOSIS — R41841 Cognitive communication deficit: Secondary | ICD-10-CM | POA: Diagnosis not present

## 2021-10-27 DIAGNOSIS — R278 Other lack of coordination: Secondary | ICD-10-CM | POA: Diagnosis not present

## 2021-10-27 DIAGNOSIS — N3946 Mixed incontinence: Secondary | ICD-10-CM | POA: Diagnosis not present

## 2021-10-28 ENCOUNTER — Encounter: Payer: Self-pay | Admitting: Neurology

## 2021-10-28 ENCOUNTER — Ambulatory Visit (INDEPENDENT_AMBULATORY_CARE_PROVIDER_SITE_OTHER): Payer: Medicare Other | Admitting: Neurology

## 2021-10-28 VITALS — BP 140/68 | HR 66 | Ht 63.5 in | Wt 143.0 lb

## 2021-10-28 DIAGNOSIS — M79642 Pain in left hand: Secondary | ICD-10-CM | POA: Diagnosis not present

## 2021-10-28 DIAGNOSIS — G309 Alzheimer's disease, unspecified: Secondary | ICD-10-CM

## 2021-10-28 DIAGNOSIS — N3946 Mixed incontinence: Secondary | ICD-10-CM | POA: Diagnosis not present

## 2021-10-28 DIAGNOSIS — F02A18 Dementia in other diseases classified elsewhere, mild, with other behavioral disturbance: Secondary | ICD-10-CM | POA: Diagnosis not present

## 2021-10-28 DIAGNOSIS — M6281 Muscle weakness (generalized): Secondary | ICD-10-CM | POA: Diagnosis not present

## 2021-10-28 DIAGNOSIS — F0280 Dementia in other diseases classified elsewhere without behavioral disturbance: Secondary | ICD-10-CM | POA: Diagnosis not present

## 2021-10-28 DIAGNOSIS — R41841 Cognitive communication deficit: Secondary | ICD-10-CM | POA: Diagnosis not present

## 2021-10-28 DIAGNOSIS — R278 Other lack of coordination: Secondary | ICD-10-CM | POA: Diagnosis not present

## 2021-10-28 MED ORDER — MEMANTINE HCL 10 MG PO TABS: 10.0000 mg | ORAL_TABLET | Freq: Two times a day (BID) | ORAL | 11 refills | Status: DC

## 2021-10-28 MED ORDER — ADLARITY 5 MG/DAY TD PTWK: 5.0000 mg | MEDICATED_PATCH | TRANSDERMAL | 11 refills | Status: DC

## 2021-10-28 NOTE — Patient Instructions (Addendum)
Increase namenda/memantine to '10mg'$  twice daily for 2 wks Then can try the Donepezil patch '5mg'$ (if no side effects mychart me and we can increase to '10mg'$ )  Donepezil Patches What is this medication? DONEPEZIL (doe NEP e zil) treats memory loss and confusion (dementia) in people who have Alzheimer disease. It works by improving attention, memory, and the ability to engage in daily activities. It is not a cure for dementia or Alzheimer disease. This medicine may be used for other purposes; ask your health care provider or pharmacist if you have questions. COMMON BRAND NAME(S): ADLARITY What should I tell my care team before I take this medication? They need to know if you have any of these conditions: Heart disease Irregular heartbeat or rhythm Liver disease Lung or breathing disease (asthma, COPD) Seizures Stomach ulcers, other stomach or intestinal problems Trouble passing urine An unusual or allergic reaction to donepezil, other medications, foods, dyes, or preservatives Pregnant or trying to get pregnant Breast-feeding How should I use this medication? This medication is for external use only. Use it as directed on the prescription label. Apply the patch to your skin. Select a clean, dry area of skin on your back, but avoid the spine. The upper buttocks or upper, outer thigh may also be used. Do not apply the patch to broken, burned, cut, or irritated skin. Apply to an area of skin that does not have much hair. Do not shave the area of skin. Do not cut or trim the patch. Do not wear more than 1 patch at a time. Remove the old patch before using a new patch. Use a different site each time to prevent skin irritation. Keep using it unless your care team tells you to stop. Take one pouch from the refrigerator and allow it to reach room temperature before opening the pouch. Take the patch out of the pouch. Do not use the patch if the packaging or patch is damaged. Take off the protective strip from  the back of the patch. Press the sticky surface to your skin with the palm of your hand. Press the patch to the skin for 30 seconds. Wash your hands with soap and water. Take off the old patch before putting on a new patch. Apply each new patch to a different area of skin. Do not use the exact same area of skin for 14 days. If a patch comes off, apply a new patch to a different skin area. Then replace the patch 7 days later. This medication comes with INSTRUCTIONS FOR USE. Ask your pharmacist for directions on how to use this medication. Read the information carefully. Talk to your pharmacist or care team if you have questions. A patient package insert for the product will be given with each prescription and refill. Be sure to read this information carefully each time. Talk to your care team about the use of this medication in children. Special care may be needed. Overdosage: If you think you have taken too much of this medicine contact a poison control center or emergency room at once. NOTE: This medicine is only for you. Do not share this medicine with others. What if I miss a dose? If you miss a dose, apply it as soon as you can. If it is almost time for your next dose, apply only that dose. Do not apply double or extra doses. What may interact with this medication? Do not take this medication with any of the following: Cisapride Dextromethorphan; quinidine Dronedarone Pimozide Quinidine Thioridazine This  medication may also interact with the following: Antihistamines for allergy, cough and cold Atropine Bethanechol Carbamazepine Certain medications for bladder problems like oxybutynin, tolterodine Certain medications for fungal infections like itraconazole, fluconazole, posaconazole, and voriconazole Certain medications for Parkinson's disease like benztropine, trihexyphenidyl Certain medications for stomach problems like dicyclomine, hyoscyamine Certain medications for travel sickness  like scopolamine Dexamethasone Dofetilide Ipratropium NSAIDs, medications for pain and inflammation, like ibuprofen or naproxen Other medications for Alzheimer's disease Other medications that cause heart rhythm changes Phenobarbital Phenytoin Rifampin, rifabutin or rifapentine Ziprasidone This list may not describe all possible interactions. Give your health care provider a list of all the medicines, herbs, non-prescription drugs, or dietary supplements you use. Also tell them if you smoke, drink alcohol, or use illegal drugs. Some items may interact with your medicine. What should I watch for while using this medication? Visit your care team for regular checks on your progress. Check with your care team if your symptoms do not get better or if they get worse. This medication may affect your coordination, reaction time, or judgment. Do not drive or operate machinery until you know how this medication affects you. Sit up or stand slowly to reduce the risk of dizzy or fainting spells. Drinking alcohol with this medication can increase the risk of these side effects. This patch is sensitive to body heat changes. If your skin gets too hot, more medication will come out of the patch and can cause a deadly overdose. Call your care team if you get a fever. Do not take hot baths. Do not sunbath. Do not use hot tubs, saunas, hairdryers, heating pads, electric blankets, heated waterbeds, or tanning lamps. If you get black, tarry stools or vomit up what looks like coffee grounds, call your care team right away. You may have a bleeding ulcer. What side effects may I notice from receiving this medication? Side effects that you should report to your care team as soon as possible: Allergic reactions--skin rash, itching, hives, swelling of the face, lips, tongue, or throat Peptic ulcer--burning stomach pain, loss of appetite, bloating, burping, heartburn, nausea, vomiting Seizures Slow heartbeat--dizziness,  feeling faint or lightheaded, confusion, trouble breathing, unusual weakness or fatigue Stomach bleeding--bloody or black, tar-like stools, vomiting blood or brown material that looks like coffee grounds Trouble passing urine Side effects that usually do not require medical attention (report these to your care team if they continue or are bothersome): Diarrhea Fatigue Irritation at application site Loss of appetite Muscle pain or cramps Nausea Trouble sleeping Vomiting This list may not describe all possible side effects. Call your doctor for medical advice about side effects. You may report side effects to FDA at 1-800-FDA-1088. Where should I keep my medication? Keep out of the reach of children and pets. Store in the refrigerator. Do not freeze. Keep it in the original carton until you are ready to use it. Remove one pouch from the carton and allow it to reach room temperature before opening the pouch. Use it within 24 hours of removing it from the refrigerator. Get rid of any unused medication after the expiration date. To get rid of medications that are no longer needed or expired: Take the medication to a medication take-back program. Check with your pharmacy or law enforcement to find a location. If you cannot return the medication, ask your pharmacist or care team how to get rid of this medication safely. NOTE: This sheet is a summary. It may not cover all possible information. If you have questions  about this medicine, talk to your doctor, pharmacist, or health care provider.  2023 [employer] Standard (2021-01-07 00:00:00)  Memantine Tablets What is this medication? MEMANTINE (MEM an teen) treats memory loss and confusion (dementia) in people who have Alzheimer disease. It works by improving attention, memory, and the ability to engage in daily activities. It is not a cure for dementia or Alzheimer disease. This medicine may be used for other purposes; ask your health care  provider or pharmacist if you have questions. COMMON BRAND NAME(S): Namenda What should I tell my care team before I take this medication? They need to know if you have any of these conditions: Kidney disease Liver disease Seizures Trouble passing urine An unusual or allergic reaction to memantine, other medications, foods, dyes, or preservatives Pregnant or trying to get pregnant Breast-feeding How should I use this medication? Take this medication by mouth with water. Follow the directions on the prescription label. You may take this medication with or without food. Take your doses at regular intervals. Do not take your medication more often than directed. Continue to take your medication even if you feel better. Do not stop taking except on the advice of your care team. Talk to your care team about the use of this medication in children. Special care may be needed Overdosage: If you think you have taken too much of this medicine contact a poison control center or emergency room at once. NOTE: This medicine is only for you. Do not share this medicine with others. What if I miss a dose? If you miss a dose, take it as soon as you can. If it is almost time for your next dose, take only that dose. Do not take double or extra doses. If you do not take your medication for several days, contact your care team. Your dose may need to be changed. What may interact with this medication? Acetazolamide Amantadine Cimetidine Dextromethorphan Dofetilide Hydrochlorothiazide Ketamine Metformin Methazolamide Quinidine Ranitidine Sodium bicarbonate Triamterene This list may not describe all possible interactions. Give your health care provider a list of all the medicines, herbs, non-prescription drugs, or dietary supplements you use. Also tell them if you smoke, drink alcohol, or use illegal drugs. Some items may interact with your medicine. What should I watch for while using this medication? Visit  your care team for regular checks on your progress. Check with your care team if there is no improvement in your symptoms or if they get worse. This medication may affect your coordination, reaction time, or judgment. Do not drive or operate machinery until you know how this medication affects you. Sit up or stand slowly to reduce the risk of dizzy or fainting spells. Drinking alcohol with this medication can increase the risk of these side effects. What side effects may I notice from receiving this medication? Side effects that you should report to your care team as soon as possible: Allergic reactions--skin rash, itching, hives, swelling of the face, lips, tongue, or throat Side effects that usually do not require medical attention (report to your care team if they continue or are bothersome): Confusion Constipation Diarrhea Dizziness Headache This list may not describe all possible side effects. Call your doctor for medical advice about side effects. You may report side effects to FDA at 1-800-FDA-1088. Where should I keep my medication? Keep out of the reach of children. Store at room temperature between 15 degrees and 30 degrees C (59 degrees and 86 degrees F). Throw away any unused medication after the  expiration date. NOTE: This sheet is a summary. It may not cover all possible information. If you have questions about this medicine, talk to your doctor, pharmacist, or health care provider.  2023 [employer] Standard (2021-06-08 00:00:00)

## 2021-10-28 NOTE — Progress Notes (Signed)
[id number]    Provider:  Dr Jaynee Eagles Requesting Provider: Suella Broad, MD Primary Care Provider:  Marin Olp, MD  CC:  Alzheimer's disease, neuropathy  10/28/2021: This is an 88 year old patient who is here for follow-up after formal memory testing.  She was tested with Dr. Melvyn Novas and per his results patient meets diagnostic criteria for dementia, the underlying etiology most likely represents either an atypical Alzheimer's, presentation of Lewy body dementia without, behavioral symptoms are combination of said etiologies.  He met with daughters and patient to review all his results.  Review of chart also showed thyroid in February 2023 was normal.  Of the brain in November 2022 was unremarkable.  She was extensively tested in the past for neuropathy which included B12 which was not deficient.  I did talk with her daughters last time about the healthy brain study, and gave dementia literature, discussed "the XX brain" by Eden Emms which is a good book for them to read about decreasing risks for dementia.  The mind diet and in the future getting an FDG PET scan if needed.  Step causes problems with patient's stool so it was stopped but Namenda was started. She had a bad UTI at the time. Taking a Tai Chi class and exercising and now more socializing and exercising. No sleep apnea symptoms. Her daughter was hit by a car and at the CT to check her out they saw a lung nodule that tuned out to be malignancy was removed, stage 1, cured. amazing story. Patient has some left calf pain, helps to stretch likely muscular due but if swelling can check for dvt, duscussed hydration.  MRI brain 04/01/2021: IMPRESSION:    MRI brain (with and without) demonstrating: - Mild atrophy and mild chronic small vessel ischemic disease. - No acute findings.  03/10/2021: 5 years ago she felt like she had a problem with playing cards and keeping score. She played it once a week. Here with daughter who  provides much information. First thing she noticed, still to this day struggles with dates and times are hard for her to keep straight. She feeds the cat every morning, 3 months ago she got up and couldn't remember how to feed the cat. She has to concentrate to do things that involve multiple steps. Dauhter notices quite a few things, she is forgetting things. She has been having back issues and had minor invasive back surgery. She had problems with the anesthesia for 3 weeks. Then she had a UTI. She is repeating things. She is feeling better. She has a monitor in case she falls. She is having trouble using the phone, she wanted to go to happy hour and wanted to bring the landline phone with her. She got her cell and her land line mixed up but that was in the UTI phase.  Had an extended visit today with patient, discussed dementia, different kinds of dementia, versus mild cognitive impairment also the role that depression and anxiety or lifestyle and genetics, medication, management, microvascular ischemia and vascular dementia and answered all questions. No other focal neurologic deficits, associated symptoms, inciting events or modifiable factors.  CT head 02/12/2021: personally reviewed images and with patient and daughter  IMPRESSION: Atrophy, chronic microvascular disease.   No acute intracranial abnormality.    HPI:  Sara Medina is a 88 y.o. female here as requested by  Suella Broad, MD for numbness of lower limb.  Past medical history severe spinal stenosis L3-L4 and L4-L5, spondylolisthesis L4-L5,  bilateral burning in the feet possibly peripheral neuropathy for which she has been requested to be seen here in neurology.  She also has a history of cardiac arrhythmia, hypertension, asthma, GERD, hypothyroidism, osteoarthritis, hyperlipidemia.  She is here with her daughter who also provides much information. 25 years ago she noticed the symptoms slowly the symptoms have crept up, within the last  2-3 months in bed she is gettng cramps and feet feel on fire. They burn. Slowly progressively worsening. The Gabapentin helps a lot. Symmetrical, it hurts severely, has cramps, Gabapentin helps, Topricin helped. 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 Cramps in the toes. Burning on the bottom on the feet from the toes to the heels. No weakness. No gaot abnormalities. No other focal neurologic deficits, associated symptoms, inciting events or modifiable factors.She is extremely organized and she takes her own medications, daughters help. We discussed cameras and she doesn't love that, they all call and text. She has a calendar or she would forget about appointments. She doesn't use a scooter anymore. She is having trouble with numbers, she doesn't cook, she doesn't bake. When she was on the anesthesias she had elaborate hallucinations which she remembers in detail today. Father had dementia in his 48s, after he had strokes. Mother was sharp until her 3s.   MRI of the brain    I reviewed  Suella Broad, MD's notes: She has a past medical history of degeneration of the lumbar intervertebral discs, spinal stenosis, postoperative care, pain in the right hip joint, bilateral carpal tunnel, low back pain.  She saw Dr. Herma Mering for bilateral leg weakness and burning in the bilateral feet the feet issue is the worst, she was started on gabapentin, and its helping, still with some low back pain, she has more than 1 problem including a crunching sensation in her neck but denies any radicular symptoms into her arms, 2% over-the-counter cream helped for about a month and it stopped working, gabapentin is giving her relief, she injured her head and went to the emergency room not too long ago they did not diagnose her with any because of that, occurred prior to taking the gabapentin, she is complaining of burning sensation in her feet, she is very active but the pain in her feet is not helping her  ability to do things, she would rather walk in her carpet at home rather than walking out in the community on a hard concrete, her daughter bought her a walker, she has not had any more recent falls, she did get a bruise over her right orbital region since she fell, negative straight leg raise on the right, negative cross her leg raise on the left, she has pretty good cervical range of motion, strength is 5 out of 5, she does have severe spinal stenosis L3-L4 and L4-L5 and spondylolisthesis L4-L5, here for evaluation of peripheral neuropathy.  She also had an L5-S1 epidural steroid injection, she had good relief for 1 day, pain is in the back of her legs but also in the front of her legs and groin.  It appears she has had 3 L5-S1 epidural steroid injections.  Reviewed notes, labs and imaging from outside physicians, which showed:  MRI of the lumbar spine December 17, 2018 shows severe L3-L4 spinal canal stenosis, moderate to severe narrowing, moderate L4-L5 spinal canal stenosis and moderate to severe narrowing, moderate to severe right and mild left L5-S1 neural foraminal narrowing with mass-effect upon the right  L5 nerve root, severe spinal stenosis at L3-L4 and L4-L5 with spondylosis at L4-L5. Reviewed images.  Review of Systems: Patient complains of symptoms per HPI as well as the following symptoms: memory loss . Pertinent negatives and positives per HPI. All others negative     Social History   Socioeconomic History   Marital status: Widowed    Spouse name: Not on file   Number of children: 3   Years of education: 62   Highest education level: Master's degree (e.g., MA, MS, MEng, MEd, MSW, MBA)  Occupational History   Occupation: Retired    Comment: PE Teacher  Tobacco Use   Smoking status: Never   Smokeless tobacco: Never  Vaping Use   Vaping Use: Never used  Substance and Sexual Activity   Alcohol use: Yes    Alcohol/week: 2.0 - 4.0 standard drinks    Types: 2 - 4 Glasses of wine  per week   Drug use: No   Sexual activity: Not Currently  Other Topics Concern   Not on file  Social History Narrative   Widowed around 2013. Lives alone with cat at [place]. Has caregiver in the mornings and evenings     3 children- 2 daughters live close. 5 grandkids. 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 Was great athlete.       Hobbies: enjoys tv, wine         Social Determinants of Radio broadcast assistant Strain: Low Risk    Difficulty of Paying Living Expenses: Not hard at all  Food Insecurity: No Food Insecurity   Worried About Charity fundraiser in the Last Year: Never true   Arboriculturist in the Last Year: Never true  Transportation Needs: No Transportation Needs   Lack of Transportation (Medical): No   Lack of Transportation (Non-Medical): No  Physical Activity: Insufficiently Active   Days of Exercise per Week: 5 days   Minutes of Exercise per Session: 20 min  Stress: No Stress Concern Present   Feeling of Stress : Not at all  Social Connections: Moderately Isolated   Frequency of Communication with Friends and Family: Three times a week   Frequency of Social Gatherings with Friends and Family: More than three times a week   Attends Religious Services: Never   Marine scientist or Organizations: Yes   Attends Archivist Meetings: 1 to 4 times per year   Marital Status: Widowed  Human resources officer Violence: Not At Risk   Fear of Current or Ex-Partner: No   Emotionally Abused: No   Physically Abused: No   Sexually Abused: No    Family History  Problem Relation Age of Onset   Ovarian cancer Mother        in 27s   Heart disease Father        died at 91   Stroke Father    Allergies Father    Arthritis Father    Dementia Father    Cancer Sister        brain. 45 died   Healthy Sister    Other Brother        died at pneumonia young   Breast cancer Maternal Aunt    Lung  cancer Daughter    Colon cancer Neg Hx     Past Medical History:  Diagnosis Date   Allergic rhinitis 05/04/2010   Asthma, exercise induced 08/18/2010  She uses the Proventil inhaler on rare occasions not on a daily basis    Benign neoplasm of colon 05/04/2010   Burning sensation of feet 06/21/2020   Chest tightness or pressure 05/13/2013   Chronic diarrhea 12/30/2020   Cystocele 10/02/2014   Essential hypertension 05/04/2010   GERD (gastroesophageal reflux disease) 05/04/2010   Pantoprazole 40- down to 20 for diarrhea    History of diverticulitis 05/04/2010   Hyperlipidemia 05/04/2010   Hypothyroidism 05/04/2010   Major depressive disorder    Menopausal disorder 08/18/2010   When she went off the HRT she had severe mood disorder    Mitral valve prolapse    Moderate dementia, without behavioral disturbance 09/07/2021   Neuropathy    Osteoarthritis 05/04/2010   Palpitations 06/30/2014   Pneumonia    S/P lumbar fusion 01/27/2021   Spinal stenosis     Patient Active Problem List   Diagnosis Date Noted   Moderate dementia, without behavioral disturbance 09/07/2021   Mitral valve prolapse 09/06/2021   Neuropathy 09/06/2021   S/P lumbar fusion 01/27/2021   Chronic diarrhea 12/30/2020   Burning sensation of feet 06/21/2020   Cystocele 10/02/2014   Palpitations 06/30/2014   Asthma, exercise induced 08/18/2010   Benign neoplasm of colon 05/04/2010   Hypothyroidism 05/04/2010   Hyperlipidemia 05/04/2010   Essential hypertension 05/04/2010   Allergic rhinitis 05/04/2010   GERD (gastroesophageal reflux disease) 05/04/2010   Osteoarthritis 05/04/2010   History of diverticulitis 05/04/2010    Past Surgical History:  Procedure Laterality Date   ABDOMINAL HYSTERECTOMY     BACK SURGERY     CATARACT EXTRACTION Bilateral    cataract surgery     COLONOSCOPY     EYE SURGERY     LAMINECTOMY WITH POSTERIOR LATERAL ARTHRODESIS LEVEL 1 N/A 01/27/2021   Procedure: Laminectomy and  Foraminotomy - Lumbar Three-Four/Lumbar Four-Five, posterior fusion with fixation Lumbar Four-Five;  Surgeon: Eustace Moore, MD;  Location: [facility];  Service: Neurosurgery;  Laterality: N/A;   SHOULDER SURGERY Right    TONSILLECTOMY     trigger thumb Right    TUBAL LIGATION     WISDOM TOOTH EXTRACTION     x4    Current Outpatient Medications  Medication Sig Dispense Refill   albuterol (VENTOLIN HFA) 108 (90 Base) MCG/ACT inhaler Inhale 2 puffs into the lungs every 6 (six) hours as needed for wheezing or shortness of breath. 8 g 0   amLODipine (NORVASC) 5 MG tablet TAKE ONE TABLET BY MOUTH DAILY 90 tablet 3   busPIRone (BUSPAR) 5 MG tablet Take 1 tablet (5 mg total) by mouth 2 (two) times daily as needed. 60 tablet 5   CALCIUM PO Take 1 capsule by mouth daily.     Cholecalciferol (VITAMIN D-3) 25 MCG (1000 UT) CAPS Take 1,000 Units by mouth daily.     citalopram (CELEXA) 10 MG tablet Take 1 tablet (10 mg total) by mouth daily. 90 tablet 3   Donepezil HCl (ADLARITY) 5 MG/DAY PTWK Place 5 mg onto the skin once a week. 4 patch 11   EPINEPHrine 0.3 mg/0.3 mL IJ SOAJ injection Inject 0.3 mg into the muscle as needed for anaphylaxis.      levothyroxine (SYNTHROID) 25 MCG tablet TAKE ONE TABLET BY MOUTH DAILY BEFORE BREAKFAST 90 tablet 3   metoprolol tartrate (LOPRESSOR) 25 MG tablet TAKE TWO TABLETS BY MOUTH EVERY MORNING AND TAKE ONE TABLET BY MOUTH EVERY EVENING 270 tablet 3   OVER THE COUNTER MEDICATION Apply 1 application topically at  bedtime. Topricin foot cream     pantoprazole (PROTONIX) 40 MG tablet Take 40 mg by mouth daily.     Probiotic Product (ALIGN) 4 MG CAPS Take 4 mg by mouth daily.     traMADol (ULTRAM) 50 MG tablet Take 1 tablet (50 mg total) by mouth every 8 (eight) hours as needed for moderate pain or severe pain (of the back). 30 tablet 1   vitamin B-12 (CYANOCOBALAMIN) 500 MCG tablet Take 500 mcg by mouth 2 (two) times daily.     gabapentin (NEURONTIN) 300 MG capsule Take up  to 4 capsules (up to 1230m) at bedtime for cramps. 120 capsule 11   memantine (NAMENDA) 10 MG tablet Take 1 tablet (10 mg total) by mouth 2 (two) times daily. 180 tablet 11   No current facility-administered medications for this visit.    Allergies as of 10/28/2021 - Review Complete 10/28/2021  Allergen Reaction Noted   Other Anaphylaxis, Shortness Of Breath, Swelling, and Rash 08/19/2019   Peanut-containing drug products Anaphylaxis, Shortness Of Breath, Swelling, and Rash 06/21/2011   Pecan nut (diagnostic) Anaphylaxis, Shortness Of Breath, and Rash 08/19/2019   Penicillins Swelling 05/04/2010    Vitals: BP 140/68 (BP Location: Left Arm, Patient Position: Sitting)   Pulse 66   Ht 5' 3.5" (1.613 m)   Wt 143 lb (64.9 kg)   BMI 24.93 kg/m  Last Weight:  Wt Readings from Last 1 Encounters:  10/28/21 143 lb (64.9 kg)   Last Height:   Ht Readings from Last 1 Encounters:  10/28/21 5' 3.5" (1.613 m)  Exam: Stable NAD, pleasant                  Speech:    Speech is normal; fluent and spontaneous with normal comprehension.  Cognition:       03/10/2021    2:17 PM  MMSE - Mini Mental State Exam  Orientation to time 5  Orientation to Place 5  Registration 3  Attention/ Calculation 1  Recall 3  Language- name 2 objects 2  Language- repeat 0  Language- follow 3 step command 3  Language- read & follow direction 1  Write a sentence 1  Copy design 0  Total score 24      Cranial Nerves:    The pupils are equal, round, and reactive to light.Trigeminal sensation is intact and the muscles of mastication are normal. The face is symmetric. The palate elevates in the midline. Hearing intact. Voice is normal. Shoulder shrug is normal. The tongue has normal motion without fasciculations.   Coordination:  No dysmetria  Motor Observation:    No asymmetry, no atrophy, and no involuntary movements noted. Tone:    Normal muscle tone.     Strength:    Strength is V/V in the upper  and lower limbs.      Sensation: intact to LT      Sensation: stable decreased pin prick and temperature distally in the toes      Reflex Exam:  DTR's:    Trace AJs. Deep tendon reflexes in the upper and lower extremities are symmetrical bilaterally.   Toes:    The toes are equiv bilaterally.   Clonus:    Clonus is absent.    Assessment/Plan:   88y.o. female here for memory loss In the past seen as requested by  RSuella Broad MD for numbness of lower limb now here for evaluation of memory loss from Dr. HYong Channel  Past medical history severe spinal stenosis  L3-L4 and L4-L5, spondylolisthesis L4-L5, peripheral neuropathy.MMSE 24/30. Had an extended visit today with patient, discussed dementia, different kinds of dementia, versus mild cognitive impairment also the role that depression and anxiety or lifestyle and genetics, medication, management, microvascular ischemia and vascular dementia and answered all questions.   MRI of the brain w/wo contrast for reversible causes of dementia - will call Formal neurocognitive testing - Diagnosed with Major neurocognitive disorder, likely Alzheimer's Healthy Brain study at [place] joining The XX Brain by Eden Emms - Great book, recommend reading Start Aricept(Donepezil) 62m: had side effects of diarrhea to the pills, was discontinued, will try the patch Started Namenda/Memantine), increase today to 166mtwice daily [facility] Diet to help prevent Alzheimer's disease, discussed and recommended FDG PET Scan help diagnose Alzheimer's - consider in furture if needed Up to 4 Gabapentin at bedtime for cramping in feet  Meds ordered this encounter  Medications   memantine (NAMENDA) 10 MG tablet    Sig: Take 1 tablet (10 mg total) by mouth 2 (two) times daily.    Dispense:  180 tablet    Refill:  11   Donepezil HCl (ADLARITY) 5 MG/DAY PTWK    Sig: Place 5 mg onto the skin once a week.    Dispense:  4 patch     Refill:  11   gabapentin (NEURONTIN) 300 MG capsule    Sig: Take up to 4 capsules (up to 120041mat bedtime for cramps.    Dispense:  120 capsule    Refill:  11     Prior Assessment and plan 06/19/2020:  Discussed the causes of peripheral neuropathy, the most common being diabetes which patient does not report having. About 20 million people in the [place] have some form of peripheral neuropathy. This is a condition that develops as a result of damage to the peripheral nervous system. Given symptoms and exam suspect a symmetric length dependent neuropathy probably small fiber axonal.. There are multiple causes eluding metabolic, toxic, infectious and endocrine disorders, small vessel disease, autoimmune diseases, and others. We'll perform extensive serum neuropathy screening. Continue Gabapentin which is helping tremendously. May also consider podiatry, heel pain may be plantar fasciitis or other concomitant structural problem contributing to symptoms.    Cc: HunMarin OlpD,  HunMarin OlpD,  RicSuella BroadD  AntSarina IllD  [facility] [id number]elleKintaC [id number] [phone] [phone] spent over 30 minutes of face-to-face and non-face-to-face time with patient on the  1. Alzheimer disease (HCCBruno    diagnosis.  This included previsit chart review, lab review, study review, order entry, electronic health record documentation, patient education on the different diagnostic and therapeutic options, counseling and coordination of care, risks and benefits of management, compliance, or risk factor reduction

## 2021-11-01 MED ORDER — GABAPENTIN 300 MG PO CAPS: ORAL_CAPSULE | ORAL | 11 refills | Status: DC

## 2021-11-03 DIAGNOSIS — R41841 Cognitive communication deficit: Secondary | ICD-10-CM | POA: Diagnosis not present

## 2021-11-03 DIAGNOSIS — M79642 Pain in left hand: Secondary | ICD-10-CM | POA: Diagnosis not present

## 2021-11-03 DIAGNOSIS — F02A18 Dementia in other diseases classified elsewhere, mild, with other behavioral disturbance: Secondary | ICD-10-CM | POA: Diagnosis not present

## 2021-11-03 DIAGNOSIS — R278 Other lack of coordination: Secondary | ICD-10-CM | POA: Diagnosis not present

## 2021-11-03 DIAGNOSIS — N3946 Mixed incontinence: Secondary | ICD-10-CM | POA: Diagnosis not present

## 2021-11-03 DIAGNOSIS — M6281 Muscle weakness (generalized): Secondary | ICD-10-CM | POA: Diagnosis not present

## 2021-11-05 DIAGNOSIS — M79642 Pain in left hand: Secondary | ICD-10-CM | POA: Diagnosis not present

## 2021-11-05 DIAGNOSIS — R41841 Cognitive communication deficit: Secondary | ICD-10-CM | POA: Diagnosis not present

## 2021-11-05 DIAGNOSIS — M6281 Muscle weakness (generalized): Secondary | ICD-10-CM | POA: Diagnosis not present

## 2021-11-05 DIAGNOSIS — N3946 Mixed incontinence: Secondary | ICD-10-CM | POA: Diagnosis not present

## 2021-11-05 DIAGNOSIS — R278 Other lack of coordination: Secondary | ICD-10-CM | POA: Diagnosis not present

## 2021-11-05 DIAGNOSIS — F02A18 Dementia in other diseases classified elsewhere, mild, with other behavioral disturbance: Secondary | ICD-10-CM | POA: Diagnosis not present

## 2021-11-08 DIAGNOSIS — M79642 Pain in left hand: Secondary | ICD-10-CM | POA: Diagnosis not present

## 2021-11-08 DIAGNOSIS — R41841 Cognitive communication deficit: Secondary | ICD-10-CM | POA: Diagnosis not present

## 2021-11-08 DIAGNOSIS — N3946 Mixed incontinence: Secondary | ICD-10-CM | POA: Diagnosis not present

## 2021-11-08 DIAGNOSIS — R278 Other lack of coordination: Secondary | ICD-10-CM | POA: Diagnosis not present

## 2021-11-08 DIAGNOSIS — F02A18 Dementia in other diseases classified elsewhere, mild, with other behavioral disturbance: Secondary | ICD-10-CM | POA: Diagnosis not present

## 2021-11-08 DIAGNOSIS — M6281 Muscle weakness (generalized): Secondary | ICD-10-CM | POA: Diagnosis not present

## 2021-11-11 DIAGNOSIS — R41841 Cognitive communication deficit: Secondary | ICD-10-CM | POA: Diagnosis not present

## 2021-11-11 DIAGNOSIS — F02A18 Dementia in other diseases classified elsewhere, mild, with other behavioral disturbance: Secondary | ICD-10-CM | POA: Diagnosis not present

## 2021-11-11 DIAGNOSIS — R278 Other lack of coordination: Secondary | ICD-10-CM | POA: Diagnosis not present

## 2021-11-11 DIAGNOSIS — M79642 Pain in left hand: Secondary | ICD-10-CM | POA: Diagnosis not present

## 2021-11-11 DIAGNOSIS — M6281 Muscle weakness (generalized): Secondary | ICD-10-CM | POA: Diagnosis not present

## 2021-11-11 DIAGNOSIS — N3946 Mixed incontinence: Secondary | ICD-10-CM | POA: Diagnosis not present

## 2021-11-12 DIAGNOSIS — R41841 Cognitive communication deficit: Secondary | ICD-10-CM | POA: Diagnosis not present

## 2021-11-12 DIAGNOSIS — M6281 Muscle weakness (generalized): Secondary | ICD-10-CM | POA: Diagnosis not present

## 2021-11-12 DIAGNOSIS — R278 Other lack of coordination: Secondary | ICD-10-CM | POA: Diagnosis not present

## 2021-11-12 DIAGNOSIS — N3946 Mixed incontinence: Secondary | ICD-10-CM | POA: Diagnosis not present

## 2021-11-12 DIAGNOSIS — F02A18 Dementia in other diseases classified elsewhere, mild, with other behavioral disturbance: Secondary | ICD-10-CM | POA: Diagnosis not present

## 2021-11-12 DIAGNOSIS — M79642 Pain in left hand: Secondary | ICD-10-CM | POA: Diagnosis not present

## 2021-11-13 ENCOUNTER — Emergency Department (HOSPITAL_BASED_OUTPATIENT_CLINIC_OR_DEPARTMENT_OTHER)
Admission: EM | Admit: 2021-11-13 | Discharge: 2021-11-13 | Disposition: A | Discharge status: Home / Self Care | Payer: Medicare Other | Attending: Emergency Medicine | Admitting: Emergency Medicine

## 2021-11-13 ENCOUNTER — Other Ambulatory Visit: Payer: Self-pay

## 2021-11-13 ENCOUNTER — Encounter (HOSPITAL_BASED_OUTPATIENT_CLINIC_OR_DEPARTMENT_OTHER): Payer: Self-pay | Admitting: Emergency Medicine

## 2021-11-13 ENCOUNTER — Emergency Department (HOSPITAL_BASED_OUTPATIENT_CLINIC_OR_DEPARTMENT_OTHER): Payer: Medicare Other | Admitting: Radiology

## 2021-11-13 DIAGNOSIS — I10 Essential (primary) hypertension: Secondary | ICD-10-CM | POA: Diagnosis not present

## 2021-11-13 DIAGNOSIS — F0390 Unspecified dementia without behavioral disturbance: Secondary | ICD-10-CM | POA: Diagnosis not present

## 2021-11-13 DIAGNOSIS — R0602 Shortness of breath: Secondary | ICD-10-CM | POA: Diagnosis not present

## 2021-11-13 DIAGNOSIS — J189 Pneumonia, unspecified organism: Secondary | ICD-10-CM | POA: Diagnosis not present

## 2021-11-13 DIAGNOSIS — Z79899 Other long term (current) drug therapy: Secondary | ICD-10-CM | POA: Insufficient documentation

## 2021-11-13 DIAGNOSIS — J45909 Unspecified asthma, uncomplicated: Secondary | ICD-10-CM | POA: Insufficient documentation

## 2021-11-13 DIAGNOSIS — Z91010 Allergy to peanuts: Secondary | ICD-10-CM | POA: Diagnosis not present

## 2021-11-13 DIAGNOSIS — J181 Lobar pneumonia, unspecified organism: Secondary | ICD-10-CM | POA: Diagnosis not present

## 2021-11-13 LAB — URINALYSIS, ROUTINE W REFLEX MICROSCOPIC
Bilirubin Urine: NEGATIVE
Glucose, UA: NEGATIVE mg/dL
Hgb urine dipstick: NEGATIVE
Ketones, ur: NEGATIVE mg/dL
Leukocytes,Ua: NEGATIVE
Nitrite: NEGATIVE
Protein, ur: NEGATIVE mg/dL
Specific Gravity, Urine: 1.011 (ref 1.005–1.030)
pH: 7 (ref 5.0–8.0)

## 2021-11-13 LAB — CBC
HCT: 38.7 % (ref 36.0–46.0)
Hemoglobin: 13.2 g/dL (ref 12.0–15.0)
MCH: 31.5 pg (ref 26.0–34.0)
MCHC: 34.1 g/dL (ref 30.0–36.0)
MCV: 92.4 fL (ref 80.0–100.0)
Platelets: 303 10*3/uL (ref 150–400)
RBC: 4.19 MIL/uL (ref 3.87–5.11)
RDW: 13.1 % (ref 11.5–15.5)
WBC: 9.4 10*3/uL (ref 4.0–10.5)
nRBC: 0 % (ref 0.0–0.2)

## 2021-11-13 LAB — BASIC METABOLIC PANEL
Anion gap: 10 (ref 5–15)
BUN: 16 mg/dL (ref 8–23)
CO2: 21 mmol/L — ABNORMAL LOW (ref 22–32)
Calcium: 9.8 mg/dL (ref 8.9–10.3)
Chloride: 100 mmol/L (ref 98–111)
Creatinine, Ser: 0.82 mg/dL (ref 0.44–1.00)
GFR, Estimated: >60 mL/min (ref >60)
Glucose, Bld: 127 mg/dL — ABNORMAL HIGH (ref 70–99)
Potassium: 4.2 mmol/L (ref 3.5–5.1)
Sodium: 131 mmol/L — ABNORMAL LOW (ref 135–145)

## 2021-11-13 MED ORDER — LEVOFLOXACIN IN D5W 750 MG/150ML IV SOLN
750.0000 mg | Freq: Once | INTRAVENOUS | Status: AC
  Administered 2021-11-13: 750 mg via INTRAVENOUS
  Filled 2021-11-13: qty 150

## 2021-11-13 MED ORDER — LEVOFLOXACIN 750 MG PO TABS: 750.0000 mg | ORAL_TABLET | Freq: Every day | ORAL | 0 refills | Status: DC

## 2021-11-13 MED ORDER — SODIUM CHLORIDE 0.9 % IV SOLN: INTRAVENOUS | Status: DC

## 2021-11-13 MED ORDER — SODIUM CHLORIDE 0.9 % IV BOLUS
500.0000 mL | Freq: Once | INTRAVENOUS | Status: AC
  Administered 2021-11-13: 500 mL via INTRAVENOUS

## 2021-11-13 NOTE — Discharge Instructions (Signed)
Take the antibiotic Levaquin as directed for the next 7 days.  Would expect improvement over the next couple days.  Return for any new or worse symptoms.  Make an appointment to follow-up with your doctor.

## 2021-11-13 NOTE — ED Triage Notes (Signed)
Pt via pov from Sara Medina with increasing fatigue over the last several days, and sob x 2 days. Pt tells her family she feels "off." Pt also has a cough intermittently, which is not abnormal for her. Pt has recent diagnosis of early dementia, but is answering questions appropriately during triage. NAD noted.

## 2021-11-13 NOTE — ED Provider Notes (Addendum)
[place] EMERGENCY DEPT Provider Note   CSN: [id number] Arrival date & time: 11/13/21  1239     History  Chief Complaint  Patient presents with   Shortness of Breath    Sara Medina is a 88 y.o. female.  Patient brought in by her daughters.  She stays at [facility].  Patient's been complaining of shortness of breath increasing fatigue sleeping a little bit longer.  Complaining of the shortness of breath for the past 2 days daughter stop the seem to be worse today.  There is intermittent intermittent cough as well patient does have diagnosis of early dementia and does forget things.  But will follow commands.  Oxygen saturations upon arrival here were 96% 94% room air.  No fevers.  Respiratory rate 14-19.  Patient does use an albuterol inhaler and daughter stated that she did use that today and that seemed to help.  Past medical history significant for mitral valve prolapse the dementia hypertension past history of pneumonia gastroesophageal reflux disease asthma more exercise-induced history of diverticulitis but no belly pain today past surgical history significant for abdominal hysterectomy.  Patient is a non-smoker.  Patient currently without any complaints.  Daughters do admit that she feels fine here but she has been waiting for a while.       Home Medications Prior to Admission medications   Medication Sig Start Date End Date Taking? Authorizing Provider  albuterol (VENTOLIN HFA) 108 (90 Base) MCG/ACT inhaler Inhale 2 puffs into the lungs every 6 (six) hours as needed for wheezing or shortness of breath. 05/17/21   Tawnya Crook, MD  amLODipine (NORVASC) 5 MG tablet TAKE ONE TABLET BY MOUTH DAILY 02/23/21   Marin Olp, MD  busPIRone (BUSPAR) 5 MG tablet Take 1 tablet (5 mg total) by mouth 2 (two) times daily as needed. 09/27/21   Marin Olp, MD  CALCIUM PO Take 1 capsule by mouth daily.    [provider]  Cholecalciferol (VITAMIN D-3)  25 MCG (1000 UT) CAPS Take 1,000 Units by mouth daily.    [provider]  citalopram (CELEXA) 10 MG tablet Take 1 tablet (10 mg total) by mouth daily. 09/27/21   Marin Olp, MD  Donepezil HCl (ADLARITY) 5 MG/DAY PTWK Place 5 mg onto the skin once a week. 10/28/21   Melvenia Beam, MD  EPINEPHrine 0.3 mg/0.3 mL IJ SOAJ injection Inject 0.3 mg into the muscle as needed for anaphylaxis.     [provider]  gabapentin (NEURONTIN) 300 MG capsule Take up to 4 capsules (up to '1200mg'$ ) at bedtime for cramps. 11/01/21   Melvenia Beam, MD  levothyroxine (SYNTHROID) 25 MCG tablet TAKE ONE TABLET BY MOUTH DAILY BEFORE BREAKFAST 02/23/21   Marin Olp, MD  memantine (NAMENDA) 10 MG tablet Take 1 tablet (10 mg total) by mouth 2 (two) times daily. 10/28/21   Melvenia Beam, MD  metoprolol tartrate (LOPRESSOR) 25 MG tablet TAKE TWO TABLETS BY MOUTH EVERY MORNING AND TAKE ONE TABLET BY MOUTH EVERY EVENING 02/23/21   Marin Olp, MD  OVER THE COUNTER MEDICATION Apply 1 application topically at bedtime. Topricin foot cream    [provider]  pantoprazole (PROTONIX) 40 MG tablet Take 40 mg by mouth daily.    [provider]  Probiotic Product (ALIGN) 4 MG CAPS Take 4 mg by mouth daily.    [provider]  traMADol (ULTRAM) 50 MG tablet Take 1 tablet (50 mg total)  by mouth every 8 (eight) hours as needed for moderate pain or severe pain (of the back). 09/27/21   Marin Olp, MD  vitamin B-12 (CYANOCOBALAMIN) 500 MCG tablet Take 500 mcg by mouth 2 (two) times daily.    [provider]      Allergies    Other, Peanut-containing drug products, Pecan nut (diagnostic), and Penicillins    Review of Systems   Review of Systems  Unable to perform ROS: Dementia    Physical Exam Updated Vital Signs BP (!) 144/71   Pulse (!) 58   Temp 98.2 F (36.8 C)   Resp 19   Ht 1.613 m (5' 3.5")   Wt 64.9 kg   SpO2 96%   BMI 24.95 kg/m  Physical  Exam Vitals and nursing note reviewed.  Constitutional:      General: She is not in acute distress.    Appearance: Normal appearance. She is well-developed.  HENT:     Head: Normocephalic and atraumatic.     Mouth/Throat:     Mouth: Mucous membranes are moist.  Eyes:     Extraocular Movements: Extraocular movements intact.     Conjunctiva/sclera: Conjunctivae normal.     Pupils: Pupils are equal, round, and reactive to light.  Cardiovascular:     Rate and Rhythm: Normal rate and regular rhythm.     Heart sounds: No murmur heard. Pulmonary:     Effort: Pulmonary effort is normal. No respiratory distress.     Breath sounds: Normal breath sounds. No decreased breath sounds, wheezing, rhonchi or rales.  Chest:     Chest wall: Tenderness present.  Abdominal:     Palpations: Abdomen is soft.     Tenderness: There is no abdominal tenderness.  Musculoskeletal:        General: No swelling.     Cervical back: Neck supple.     Right lower leg: No edema.     Left lower leg: No edema.  Skin:    General: Skin is warm and dry.     Capillary Refill: Capillary refill takes less than 2 seconds.  Neurological:     Mental Status: She is alert. Mental status is at baseline.     Cranial Nerves: No cranial nerve deficit.     Motor: No weakness.  Psychiatric:        Mood and Affect: Mood normal.     ED Results / Procedures / Treatments   Labs (all labs ordered are listed, but only abnormal results are displayed) Labs Reviewed  BASIC METABOLIC PANEL - Abnormal; Notable for the following components:      Result Value   Sodium 131 (*)    CO2 21 (*)    Glucose, Bld 127 (*)    All other components within normal limits  CBC  URINALYSIS, ROUTINE W REFLEX MICROSCOPIC  CBG MONITORING, ED    EKG EKG Interpretation  Date/Time:  Saturday November 13 2021 12:58:43 EDT Ventricular Rate:  70 PR Interval:  278 QRS Duration: 74 QT Interval:  404 QTC Calculation: 436 R Axis:   44 Text  Interpretation: Sinus rhythm with 1st degree A-V block with Premature supraventricular complexes Cannot rule out Anterior infarct (cited on or before 12-Feb-2021) Abnormal ECG When compared with ECG of 12-Feb-2021 08:50, Premature supraventricular complexes are now Present No significant change since last tracing Confirmed by Fredia Sorrow [id number]) on 11/13/2021 4:49:40 PM  Radiology DG Chest 2 View  Result Date: 11/13/2021 CLINICAL DATA:  Shortness of breath cough.  EXAM: CHEST - 2 VIEW COMPARISON:  Two-view chest x-ray 02/12/2021 FINDINGS: Heart size is normal. Atherosclerotic calcifications are present at the aortic arch. Ill-defined lingular airspace opacity is present. Lungs are otherwise clear. Degenerative changes are noted at both shoulders. IMPRESSION: 1. Ill-defined lingular airspace opacity worrisome for pneumonia. 2. Aortic atherosclerosis. Electronically Signed   By: San Morelle M.D.   On: 11/13/2021 17:42    Procedures Procedures    Medications Ordered in ED Medications - No data to display  ED Course/ Medical Decision Making/ A&P                           Medical Decision Making Amount and/or Complexity of Data Reviewed Labs: ordered. Radiology: ordered.  Risk Prescription drug management.   Patient's labs here basic metabolic panel sodium down a little bit at 131 potassium normal at 4.2 glucose 127 BUN and creatinine is normal normal renal function  CBC no leukocytosis hemoglobin 13.2 platelets are normal urinalysis negative for urinary tract infection.  Patient's EKG without any acute changes from previous.  Does show a first-degree AV block.  Does have some artifact but no acute changes.  Saturations at rest are 97% no tachypnea.  Lungs are clear bilaterally.  Chest x-ray will be important ordered PA and lateral chest x-ray.  Clinically patient does appear well enough probably for discharge.  No leg swelling.  No clinical concerns for pulmonary embolus.  May  require follow-up with her primary care doctor.  Patient seemed to be having some congestion.  May be a trial of an antihistamine like Zyrtec or Claritin may be worthwhile.  Chest x-ray suggestive of a lingular pneumonia.  Will treat with a dose of Rocephin here and oral Zithromax and then continue Zithromax at home.  Mostly doing the IV Rocephin here because of the patient's age.  Correction it appears patient has significant penicillin allergy with marked facial swelling.  So we will treat her with 1 dose of IV Levaquin here and then have her take Levaquin orally at home.  Patient also given 500 cc normal saline here for a slight decrease in her sodium since we are giving the IV antibiotics.  Patient will start taking the oral Levaquin tomorrow and will return for any new or worse symptoms or follow-up with her doctors.   Final Clinical Impression(s) / ED Diagnoses Final diagnoses:  SOB (shortness of breath)  Community acquired pneumonia of left lower lobe of lung    Rx / DC Orders ED Discharge Orders     None         Fredia Sorrow, MD 11/13/21 1705    Fredia Sorrow, MD 11/13/21 1755    Fredia Sorrow, MD 11/13/21 Gari Crown, MD 11/13/21 [phone]

## 2021-11-17 ENCOUNTER — Ambulatory Visit (INDEPENDENT_AMBULATORY_CARE_PROVIDER_SITE_OTHER): Payer: Medicare Other | Admitting: Family Medicine

## 2021-11-17 ENCOUNTER — Encounter: Payer: Self-pay | Admitting: Family Medicine

## 2021-11-17 VITALS — BP 140/70 | HR 60 | Temp 98.1°F | Ht 64.0 in | Wt 146.1 lb

## 2021-11-17 DIAGNOSIS — J189 Pneumonia, unspecified organism: Secondary | ICD-10-CM

## 2021-11-17 NOTE — Patient Instructions (Signed)
It was very nice to see you today!  get X-ray/labs at [facility]  [place]  hours 8=M-F 8:30-5.  closed 12:30-1 lunch   Do x-ray in 1 month.    PLEASE NOTE:  If you had any lab tests please let us know if you have not heard back within a few days. You may see your results on MyChart before we have a chance to review them but we will give you a call once they are reviewed by [place]. If we ordered any referrals today, please let us know if you have not heard from their office within the next week.   Please try these tips to maintain a healthy lifestyle:  Eat most of your calories during the day when you are active. Eliminate processed foods including packaged sweets (pies, cakes, cookies), reduce intake of potatoes, white bread, white pasta, and white rice. Look for whole grain options, oat flour or almond flour.  Each meal should contain half fruits/vegetables, one quarter protein, and one quarter carbs (no bigger than a computer mouse).  Cut down on sweet beverages. This includes juice, soda, and sweet tea. Also watch fruit intake, though this is a healthier sweet option, it still contains natural sugar! Limit to 3 servings daily.  Drink at least 1 glass of water with each meal and aim for at least 8 glasses per day  Exercise at least 150 minutes every week.

## 2021-11-17 NOTE — Progress Notes (Signed)
Subjective:     Patient ID: Sara Medina, female    DOB: [birth year], 88 y.o.   MRN: [id number]  Chief Complaint  Patient presents with   Follow-up    ED follow-up from 11/13/2021 Still having trouble breathing     HPI-here w/dau [facility] f/u.  Seen in 11/13/21 for sob.  Pt lives at [facility]. Tried to go for walk and took 20 steps and couldn't breathe and fatigued. C/o sob and worseing fagiue for 2 days pta.  Intermitt cough.  Has dementia so hard to assess at times history.  Using mdi 1-2x/day.  Cxr:  Ill-defined lingular airspace opacity worrisome for pneumonia   labs unremarkable. Got rIV levaquin in er zmax w/rx for levaquin at home.   There are no preventive care reminders to display for this patient.   Past Medical History:  Diagnosis Date   Allergic rhinitis 05/04/2010   Asthma, exercise induced 08/18/2010    She uses the Proventil inhaler on rare occasions not on a daily basis    Benign neoplasm of colon 05/04/2010   Burning sensation of feet 06/21/2020   Chest tightness or pressure 05/13/2013   Chronic diarrhea 12/30/2020   Cystocele 10/02/2014   Essential hypertension 05/04/2010   GERD (gastroesophageal reflux disease) 05/04/2010   Pantoprazole 40- down to 20 for diarrhea    History of diverticulitis 05/04/2010   Hyperlipidemia 05/04/2010   Hypothyroidism 05/04/2010   Major depressive disorder    Menopausal disorder 08/18/2010   When she went off the HRT she had severe mood disorder    Mitral valve prolapse    Moderate dementia, without behavioral disturbance 09/07/2021   Neuropathy    Osteoarthritis 05/04/2010   Palpitations 06/30/2014   Pneumonia    S/P lumbar fusion 01/27/2021   Spinal stenosis     Past Surgical History:  Procedure Laterality Date   ABDOMINAL HYSTERECTOMY     BACK SURGERY     CATARACT EXTRACTION Bilateral    cataract surgery     COLONOSCOPY     EYE SURGERY     LAMINECTOMY WITH POSTERIOR LATERAL ARTHRODESIS LEVEL 1 N/A 01/27/2021    Procedure: Laminectomy and Foraminotomy - Lumbar Three-Four/Lumbar Four-Five, posterior fusion with fixation Lumbar Four-Five;  Surgeon: Eustace Moore, MD;  Location: [facility];  Service: Neurosurgery;  Laterality: N/A;   SHOULDER SURGERY Right    TONSILLECTOMY     trigger thumb Right    TUBAL LIGATION     WISDOM TOOTH EXTRACTION     x4    Outpatient Medications Prior to Visit  Medication Sig Dispense Refill   albuterol (VENTOLIN HFA) 108 (90 Base) MCG/ACT inhaler Inhale 2 puffs into the lungs every 6 (six) hours as needed for wheezing or shortness of breath. 8 g 0   amLODipine (NORVASC) 5 MG tablet TAKE ONE TABLET BY MOUTH DAILY 90 tablet 3   busPIRone (BUSPAR) 5 MG tablet Take 1 tablet (5 mg total) by mouth 2 (two) times daily as needed. 60 tablet 5   CALCIUM PO Take 1 capsule by mouth daily.     Cholecalciferol (VITAMIN D-3) 25 MCG (1000 UT) CAPS Take 1,000 Units by mouth daily.     citalopram (CELEXA) 10 MG tablet Take 1 tablet (10 mg total) by mouth daily. 90 tablet 3   Donepezil HCl (ADLARITY) 5 MG/DAY PTWK Place 5 mg onto the skin once a week. 4 patch 11   EPINEPHrine 0.3 mg/0.3 mL IJ SOAJ injection Inject 0.3 mg into the  muscle as needed for anaphylaxis.      gabapentin (NEURONTIN) 300 MG capsule Take up to 4 capsules (up to '1200mg'$ ) at bedtime for cramps. 120 capsule 11   levofloxacin (LEVAQUIN) 750 MG tablet Take 1 tablet (750 mg total) by mouth daily. 7 tablet 0   levothyroxine (SYNTHROID) 25 MCG tablet TAKE ONE TABLET BY MOUTH DAILY BEFORE BREAKFAST 90 tablet 3   memantine (NAMENDA) 10 MG tablet Take 1 tablet (10 mg total) by mouth 2 (two) times daily. 180 tablet 11   metoprolol tartrate (LOPRESSOR) 25 MG tablet TAKE TWO TABLETS BY MOUTH EVERY MORNING AND TAKE ONE TABLET BY MOUTH EVERY EVENING 270 tablet 3   OVER THE COUNTER MEDICATION Apply 1 application topically at bedtime. Topricin foot cream     pantoprazole (PROTONIX) 40 MG tablet Take 40 mg by mouth daily.     Probiotic  Product (ALIGN) 4 MG CAPS Take 4 mg by mouth daily.     traMADol (ULTRAM) 50 MG tablet Take 1 tablet (50 mg total) by mouth every 8 (eight) hours as needed for moderate pain or severe pain (of the back). 30 tablet 1   vitamin B-12 (CYANOCOBALAMIN) 500 MCG tablet Take 500 mcg by mouth 2 (two) times daily.     No facility-administered medications prior to visit.    Allergies  Allergen Reactions   Other Anaphylaxis, Shortness Of Breath, Swelling and Rash    ALL NUTS!!!!   Peanut-Containing Drug Products Anaphylaxis, Shortness Of Breath, Swelling and Rash   Pecan Nut (Diagnostic) Anaphylaxis, Shortness Of Breath and Rash   Penicillins Swelling    Full facial swelling Did it involve swelling of the face/tongue/throat, SOB, or low BP? Yes Did it involve sudden or severe rash/hives, skin peeling, or any reaction on the inside of your mouth or nose? Yes Did you need to seek medical attention at a hospital or doctor's office? No When did it last happen? "many years ago" If all above answers are "NO", may proceed with cephalosporin use.    ROS neg/noncontributory except as noted HPI/below [place] sore.       Objective:     BP 140/70   Pulse 60   Temp 98.1 F (36.7 C) (Temporal)   Ht '5\' 4"'$  (1.626 m)   Wt 146 lb 2 oz (66.3 kg)   SpO2 98%   BMI 25.08 kg/m  Wt Readings from Last 3 Encounters:  11/17/21 146 lb 2 oz (66.3 kg)  11/13/21 143 lb 1.3 oz (64.9 kg)  10/28/21 143 lb (64.9 kg)    Physical Exam   Gen: WDWN NAD elderly wf HEENT: NCAT, conjunctiva not injected, sclera nonicteric NECK:  supple, no thyromegaly, no nodes, no carotid bruits CARDIAC: RRR, S1S2+, DP 1+B LUNGS: CTAB. No wheezes ABDOMEN:  BS+, soft, NTND, No HSM, no masses EXT:  no edema MSK: no gross abnormalities.  NEURO: A&O x1.  CN II-XII intact.  PSYCH: normal mood. Good eye contact. jovial    Reviewd er records-spent 76mn reviewing records, discussing plan, answering questions.  Assessment & Plan:    Problem List Items Addressed This Visit   None Visit Diagnoses     Lingular pneumonia    -  Primary      Lingular pneumonia-on levaquin.  Cont.  Rest.  Inhaler as needed.  Worse, let [place].  O/w repeat cxr in 1 mo.  Discussed course of dz and recovery.  No orders of the defined types were placed in this encounter.   ACelene Squibb  Cherlynn Kaiser, MD

## 2021-11-18 DIAGNOSIS — M6281 Muscle weakness (generalized): Secondary | ICD-10-CM | POA: Diagnosis not present

## 2021-11-18 DIAGNOSIS — N3946 Mixed incontinence: Secondary | ICD-10-CM | POA: Diagnosis not present

## 2021-11-18 DIAGNOSIS — R278 Other lack of coordination: Secondary | ICD-10-CM | POA: Diagnosis not present

## 2021-11-18 DIAGNOSIS — R41841 Cognitive communication deficit: Secondary | ICD-10-CM | POA: Diagnosis not present

## 2021-11-18 DIAGNOSIS — F02A18 Dementia in other diseases classified elsewhere, mild, with other behavioral disturbance: Secondary | ICD-10-CM | POA: Diagnosis not present

## 2021-11-18 DIAGNOSIS — M79642 Pain in left hand: Secondary | ICD-10-CM | POA: Diagnosis not present

## 2021-11-25 DIAGNOSIS — N3946 Mixed incontinence: Secondary | ICD-10-CM | POA: Diagnosis not present

## 2021-11-25 DIAGNOSIS — R41841 Cognitive communication deficit: Secondary | ICD-10-CM | POA: Diagnosis not present

## 2021-11-25 DIAGNOSIS — R278 Other lack of coordination: Secondary | ICD-10-CM | POA: Diagnosis not present

## 2021-11-25 DIAGNOSIS — M6281 Muscle weakness (generalized): Secondary | ICD-10-CM | POA: Diagnosis not present

## 2021-11-25 DIAGNOSIS — F02A18 Dementia in other diseases classified elsewhere, mild, with other behavioral disturbance: Secondary | ICD-10-CM | POA: Diagnosis not present

## 2021-11-25 DIAGNOSIS — M79642 Pain in left hand: Secondary | ICD-10-CM | POA: Diagnosis not present

## 2021-11-29 DIAGNOSIS — R278 Other lack of coordination: Secondary | ICD-10-CM | POA: Diagnosis not present

## 2021-11-29 DIAGNOSIS — R41841 Cognitive communication deficit: Secondary | ICD-10-CM | POA: Diagnosis not present

## 2021-11-29 DIAGNOSIS — M5459 Other low back pain: Secondary | ICD-10-CM | POA: Diagnosis not present

## 2021-11-29 DIAGNOSIS — F02A18 Dementia in other diseases classified elsewhere, mild, with other behavioral disturbance: Secondary | ICD-10-CM | POA: Diagnosis not present

## 2021-11-29 DIAGNOSIS — M6281 Muscle weakness (generalized): Secondary | ICD-10-CM | POA: Diagnosis not present

## 2021-11-29 DIAGNOSIS — N3946 Mixed incontinence: Secondary | ICD-10-CM | POA: Diagnosis not present

## 2021-11-29 DIAGNOSIS — M79642 Pain in left hand: Secondary | ICD-10-CM | POA: Diagnosis not present

## 2021-11-29 DIAGNOSIS — R2681 Unsteadiness on feet: Secondary | ICD-10-CM | POA: Diagnosis not present

## 2021-12-01 DIAGNOSIS — R41841 Cognitive communication deficit: Secondary | ICD-10-CM | POA: Diagnosis not present

## 2021-12-01 DIAGNOSIS — F02A18 Dementia in other diseases classified elsewhere, mild, with other behavioral disturbance: Secondary | ICD-10-CM | POA: Diagnosis not present

## 2021-12-01 DIAGNOSIS — R278 Other lack of coordination: Secondary | ICD-10-CM | POA: Diagnosis not present

## 2021-12-01 DIAGNOSIS — R2681 Unsteadiness on feet: Secondary | ICD-10-CM | POA: Diagnosis not present

## 2021-12-01 DIAGNOSIS — M5459 Other low back pain: Secondary | ICD-10-CM | POA: Diagnosis not present

## 2021-12-01 DIAGNOSIS — M6281 Muscle weakness (generalized): Secondary | ICD-10-CM | POA: Diagnosis not present

## 2021-12-02 DIAGNOSIS — M6281 Muscle weakness (generalized): Secondary | ICD-10-CM | POA: Diagnosis not present

## 2021-12-02 DIAGNOSIS — R2681 Unsteadiness on feet: Secondary | ICD-10-CM | POA: Diagnosis not present

## 2021-12-02 DIAGNOSIS — R278 Other lack of coordination: Secondary | ICD-10-CM | POA: Diagnosis not present

## 2021-12-02 DIAGNOSIS — F02A18 Dementia in other diseases classified elsewhere, mild, with other behavioral disturbance: Secondary | ICD-10-CM | POA: Diagnosis not present

## 2021-12-02 DIAGNOSIS — R41841 Cognitive communication deficit: Secondary | ICD-10-CM | POA: Diagnosis not present

## 2021-12-02 DIAGNOSIS — M5459 Other low back pain: Secondary | ICD-10-CM | POA: Diagnosis not present

## 2021-12-06 DIAGNOSIS — R41841 Cognitive communication deficit: Secondary | ICD-10-CM | POA: Diagnosis not present

## 2021-12-06 DIAGNOSIS — M5459 Other low back pain: Secondary | ICD-10-CM | POA: Diagnosis not present

## 2021-12-06 DIAGNOSIS — R2681 Unsteadiness on feet: Secondary | ICD-10-CM | POA: Diagnosis not present

## 2021-12-06 DIAGNOSIS — R278 Other lack of coordination: Secondary | ICD-10-CM | POA: Diagnosis not present

## 2021-12-06 DIAGNOSIS — M6281 Muscle weakness (generalized): Secondary | ICD-10-CM | POA: Diagnosis not present

## 2021-12-06 DIAGNOSIS — F02A18 Dementia in other diseases classified elsewhere, mild, with other behavioral disturbance: Secondary | ICD-10-CM | POA: Diagnosis not present

## 2021-12-07 ENCOUNTER — Other Ambulatory Visit: Payer: Self-pay | Admitting: Family Medicine

## 2021-12-07 DIAGNOSIS — M5459 Other low back pain: Secondary | ICD-10-CM | POA: Diagnosis not present

## 2021-12-07 DIAGNOSIS — R278 Other lack of coordination: Secondary | ICD-10-CM | POA: Diagnosis not present

## 2021-12-07 DIAGNOSIS — R41841 Cognitive communication deficit: Secondary | ICD-10-CM | POA: Diagnosis not present

## 2021-12-07 DIAGNOSIS — F02A18 Dementia in other diseases classified elsewhere, mild, with other behavioral disturbance: Secondary | ICD-10-CM | POA: Diagnosis not present

## 2021-12-07 DIAGNOSIS — R2681 Unsteadiness on feet: Secondary | ICD-10-CM | POA: Diagnosis not present

## 2021-12-07 DIAGNOSIS — M6281 Muscle weakness (generalized): Secondary | ICD-10-CM | POA: Diagnosis not present

## 2021-12-08 ENCOUNTER — Other Ambulatory Visit: Payer: Self-pay | Admitting: Family Medicine

## 2021-12-08 ENCOUNTER — Encounter: Payer: Self-pay | Admitting: Family Medicine

## 2021-12-08 DIAGNOSIS — M5459 Other low back pain: Secondary | ICD-10-CM | POA: Diagnosis not present

## 2021-12-08 DIAGNOSIS — R41841 Cognitive communication deficit: Secondary | ICD-10-CM | POA: Diagnosis not present

## 2021-12-08 DIAGNOSIS — R278 Other lack of coordination: Secondary | ICD-10-CM | POA: Diagnosis not present

## 2021-12-08 DIAGNOSIS — F02A18 Dementia in other diseases classified elsewhere, mild, with other behavioral disturbance: Secondary | ICD-10-CM | POA: Diagnosis not present

## 2021-12-08 DIAGNOSIS — M6281 Muscle weakness (generalized): Secondary | ICD-10-CM | POA: Diagnosis not present

## 2021-12-08 DIAGNOSIS — R2681 Unsteadiness on feet: Secondary | ICD-10-CM | POA: Diagnosis not present

## 2021-12-09 ENCOUNTER — Encounter: Payer: Self-pay | Admitting: Family Medicine

## 2021-12-09 ENCOUNTER — Other Ambulatory Visit: Payer: Self-pay

## 2021-12-09 ENCOUNTER — Ambulatory Visit (INDEPENDENT_AMBULATORY_CARE_PROVIDER_SITE_OTHER)
Admission: RE | Admit: 2021-12-09 | Discharge: 2021-12-09 | Disposition: A | Discharge status: Home / Self Care | Payer: Medicare Other | Source: Ambulatory Visit | Attending: Family Medicine | Admitting: Family Medicine

## 2021-12-09 DIAGNOSIS — F02A18 Dementia in other diseases classified elsewhere, mild, with other behavioral disturbance: Secondary | ICD-10-CM | POA: Diagnosis not present

## 2021-12-09 DIAGNOSIS — G8929 Other chronic pain: Secondary | ICD-10-CM

## 2021-12-09 DIAGNOSIS — J189 Pneumonia, unspecified organism: Secondary | ICD-10-CM

## 2021-12-09 DIAGNOSIS — M5459 Other low back pain: Secondary | ICD-10-CM | POA: Diagnosis not present

## 2021-12-09 DIAGNOSIS — M6281 Muscle weakness (generalized): Secondary | ICD-10-CM | POA: Diagnosis not present

## 2021-12-09 DIAGNOSIS — R278 Other lack of coordination: Secondary | ICD-10-CM | POA: Diagnosis not present

## 2021-12-09 DIAGNOSIS — R41841 Cognitive communication deficit: Secondary | ICD-10-CM | POA: Diagnosis not present

## 2021-12-09 DIAGNOSIS — R2681 Unsteadiness on feet: Secondary | ICD-10-CM | POA: Diagnosis not present

## 2021-12-13 DIAGNOSIS — R41841 Cognitive communication deficit: Secondary | ICD-10-CM | POA: Diagnosis not present

## 2021-12-13 DIAGNOSIS — M6281 Muscle weakness (generalized): Secondary | ICD-10-CM | POA: Diagnosis not present

## 2021-12-13 DIAGNOSIS — F02A18 Dementia in other diseases classified elsewhere, mild, with other behavioral disturbance: Secondary | ICD-10-CM | POA: Diagnosis not present

## 2021-12-13 DIAGNOSIS — R2681 Unsteadiness on feet: Secondary | ICD-10-CM | POA: Diagnosis not present

## 2021-12-13 DIAGNOSIS — R278 Other lack of coordination: Secondary | ICD-10-CM | POA: Diagnosis not present

## 2021-12-13 DIAGNOSIS — M5459 Other low back pain: Secondary | ICD-10-CM | POA: Diagnosis not present

## 2021-12-15 DIAGNOSIS — R41841 Cognitive communication deficit: Secondary | ICD-10-CM | POA: Diagnosis not present

## 2021-12-15 DIAGNOSIS — R278 Other lack of coordination: Secondary | ICD-10-CM | POA: Diagnosis not present

## 2021-12-15 DIAGNOSIS — R2681 Unsteadiness on feet: Secondary | ICD-10-CM | POA: Diagnosis not present

## 2021-12-15 DIAGNOSIS — F02A18 Dementia in other diseases classified elsewhere, mild, with other behavioral disturbance: Secondary | ICD-10-CM | POA: Diagnosis not present

## 2021-12-15 DIAGNOSIS — M5459 Other low back pain: Secondary | ICD-10-CM | POA: Diagnosis not present

## 2021-12-15 DIAGNOSIS — M6281 Muscle weakness (generalized): Secondary | ICD-10-CM | POA: Diagnosis not present

## 2021-12-16 DIAGNOSIS — M5459 Other low back pain: Secondary | ICD-10-CM | POA: Diagnosis not present

## 2021-12-16 DIAGNOSIS — M6281 Muscle weakness (generalized): Secondary | ICD-10-CM | POA: Diagnosis not present

## 2021-12-16 DIAGNOSIS — F02A18 Dementia in other diseases classified elsewhere, mild, with other behavioral disturbance: Secondary | ICD-10-CM | POA: Diagnosis not present

## 2021-12-16 DIAGNOSIS — R41841 Cognitive communication deficit: Secondary | ICD-10-CM | POA: Diagnosis not present

## 2021-12-16 DIAGNOSIS — R2681 Unsteadiness on feet: Secondary | ICD-10-CM | POA: Diagnosis not present

## 2021-12-16 DIAGNOSIS — R278 Other lack of coordination: Secondary | ICD-10-CM | POA: Diagnosis not present

## 2021-12-16 NOTE — Telephone Encounter (Signed)
See Referral communication for follow up

## 2021-12-17 ENCOUNTER — Other Ambulatory Visit: Payer: Self-pay | Admitting: Family Medicine

## 2021-12-17 DIAGNOSIS — R41841 Cognitive communication deficit: Secondary | ICD-10-CM | POA: Diagnosis not present

## 2021-12-17 DIAGNOSIS — M5459 Other low back pain: Secondary | ICD-10-CM | POA: Diagnosis not present

## 2021-12-17 DIAGNOSIS — F02A18 Dementia in other diseases classified elsewhere, mild, with other behavioral disturbance: Secondary | ICD-10-CM | POA: Diagnosis not present

## 2021-12-17 DIAGNOSIS — Z1231 Encounter for screening mammogram for malignant neoplasm of breast: Secondary | ICD-10-CM

## 2021-12-17 DIAGNOSIS — R278 Other lack of coordination: Secondary | ICD-10-CM | POA: Diagnosis not present

## 2021-12-17 DIAGNOSIS — M6281 Muscle weakness (generalized): Secondary | ICD-10-CM | POA: Diagnosis not present

## 2021-12-17 DIAGNOSIS — R2681 Unsteadiness on feet: Secondary | ICD-10-CM | POA: Diagnosis not present

## 2021-12-20 DIAGNOSIS — M6281 Muscle weakness (generalized): Secondary | ICD-10-CM | POA: Diagnosis not present

## 2021-12-20 DIAGNOSIS — F02A18 Dementia in other diseases classified elsewhere, mild, with other behavioral disturbance: Secondary | ICD-10-CM | POA: Diagnosis not present

## 2021-12-20 DIAGNOSIS — M5459 Other low back pain: Secondary | ICD-10-CM | POA: Diagnosis not present

## 2021-12-20 DIAGNOSIS — R278 Other lack of coordination: Secondary | ICD-10-CM | POA: Diagnosis not present

## 2021-12-20 DIAGNOSIS — R41841 Cognitive communication deficit: Secondary | ICD-10-CM | POA: Diagnosis not present

## 2021-12-20 DIAGNOSIS — R2681 Unsteadiness on feet: Secondary | ICD-10-CM | POA: Diagnosis not present

## 2021-12-21 DIAGNOSIS — R278 Other lack of coordination: Secondary | ICD-10-CM | POA: Diagnosis not present

## 2021-12-21 DIAGNOSIS — R2681 Unsteadiness on feet: Secondary | ICD-10-CM | POA: Diagnosis not present

## 2021-12-21 DIAGNOSIS — F02A18 Dementia in other diseases classified elsewhere, mild, with other behavioral disturbance: Secondary | ICD-10-CM | POA: Diagnosis not present

## 2021-12-21 DIAGNOSIS — M5459 Other low back pain: Secondary | ICD-10-CM | POA: Diagnosis not present

## 2021-12-21 DIAGNOSIS — M6281 Muscle weakness (generalized): Secondary | ICD-10-CM | POA: Diagnosis not present

## 2021-12-21 DIAGNOSIS — R41841 Cognitive communication deficit: Secondary | ICD-10-CM | POA: Diagnosis not present

## 2021-12-24 DIAGNOSIS — R41841 Cognitive communication deficit: Secondary | ICD-10-CM | POA: Diagnosis not present

## 2021-12-24 DIAGNOSIS — M5459 Other low back pain: Secondary | ICD-10-CM | POA: Diagnosis not present

## 2021-12-24 DIAGNOSIS — R278 Other lack of coordination: Secondary | ICD-10-CM | POA: Diagnosis not present

## 2021-12-24 DIAGNOSIS — F02A18 Dementia in other diseases classified elsewhere, mild, with other behavioral disturbance: Secondary | ICD-10-CM | POA: Diagnosis not present

## 2021-12-24 DIAGNOSIS — M6281 Muscle weakness (generalized): Secondary | ICD-10-CM | POA: Diagnosis not present

## 2021-12-24 DIAGNOSIS — R2681 Unsteadiness on feet: Secondary | ICD-10-CM | POA: Diagnosis not present

## 2021-12-27 DIAGNOSIS — R41841 Cognitive communication deficit: Secondary | ICD-10-CM | POA: Diagnosis not present

## 2021-12-27 DIAGNOSIS — F02A18 Dementia in other diseases classified elsewhere, mild, with other behavioral disturbance: Secondary | ICD-10-CM | POA: Diagnosis not present

## 2021-12-27 DIAGNOSIS — R278 Other lack of coordination: Secondary | ICD-10-CM | POA: Diagnosis not present

## 2021-12-27 DIAGNOSIS — M5459 Other low back pain: Secondary | ICD-10-CM | POA: Diagnosis not present

## 2021-12-27 DIAGNOSIS — M6281 Muscle weakness (generalized): Secondary | ICD-10-CM | POA: Diagnosis not present

## 2021-12-27 DIAGNOSIS — R2681 Unsteadiness on feet: Secondary | ICD-10-CM | POA: Diagnosis not present

## 2021-12-29 ENCOUNTER — Ambulatory Visit
Admission: RE | Admit: 2021-12-29 | Discharge: 2021-12-29 | Disposition: A | Discharge status: Home / Self Care | Payer: Medicare Other | Source: Ambulatory Visit | Attending: Family Medicine | Admitting: Family Medicine

## 2021-12-29 DIAGNOSIS — F02A18 Dementia in other diseases classified elsewhere, mild, with other behavioral disturbance: Secondary | ICD-10-CM | POA: Diagnosis not present

## 2021-12-29 DIAGNOSIS — M79642 Pain in left hand: Secondary | ICD-10-CM | POA: Diagnosis not present

## 2021-12-29 DIAGNOSIS — Z1231 Encounter for screening mammogram for malignant neoplasm of breast: Secondary | ICD-10-CM

## 2021-12-29 DIAGNOSIS — R278 Other lack of coordination: Secondary | ICD-10-CM | POA: Diagnosis not present

## 2021-12-29 DIAGNOSIS — R2681 Unsteadiness on feet: Secondary | ICD-10-CM | POA: Diagnosis not present

## 2021-12-29 DIAGNOSIS — M6281 Muscle weakness (generalized): Secondary | ICD-10-CM | POA: Diagnosis not present

## 2021-12-29 DIAGNOSIS — R41841 Cognitive communication deficit: Secondary | ICD-10-CM | POA: Diagnosis not present

## 2021-12-29 DIAGNOSIS — N3946 Mixed incontinence: Secondary | ICD-10-CM | POA: Diagnosis not present

## 2021-12-29 DIAGNOSIS — M5459 Other low back pain: Secondary | ICD-10-CM | POA: Diagnosis not present

## 2021-12-30 DIAGNOSIS — F02A18 Dementia in other diseases classified elsewhere, mild, with other behavioral disturbance: Secondary | ICD-10-CM | POA: Diagnosis not present

## 2021-12-30 DIAGNOSIS — R278 Other lack of coordination: Secondary | ICD-10-CM | POA: Diagnosis not present

## 2021-12-30 DIAGNOSIS — R2681 Unsteadiness on feet: Secondary | ICD-10-CM | POA: Diagnosis not present

## 2021-12-30 DIAGNOSIS — R41841 Cognitive communication deficit: Secondary | ICD-10-CM | POA: Diagnosis not present

## 2021-12-30 DIAGNOSIS — M6281 Muscle weakness (generalized): Secondary | ICD-10-CM | POA: Diagnosis not present

## 2021-12-30 DIAGNOSIS — M5459 Other low back pain: Secondary | ICD-10-CM | POA: Diagnosis not present

## 2021-12-31 DIAGNOSIS — F02A18 Dementia in other diseases classified elsewhere, mild, with other behavioral disturbance: Secondary | ICD-10-CM | POA: Diagnosis not present

## 2021-12-31 DIAGNOSIS — M5459 Other low back pain: Secondary | ICD-10-CM | POA: Diagnosis not present

## 2021-12-31 DIAGNOSIS — M6281 Muscle weakness (generalized): Secondary | ICD-10-CM | POA: Diagnosis not present

## 2021-12-31 DIAGNOSIS — R278 Other lack of coordination: Secondary | ICD-10-CM | POA: Diagnosis not present

## 2021-12-31 DIAGNOSIS — R41841 Cognitive communication deficit: Secondary | ICD-10-CM | POA: Diagnosis not present

## 2021-12-31 DIAGNOSIS — R2681 Unsteadiness on feet: Secondary | ICD-10-CM | POA: Diagnosis not present

## 2022-01-03 DIAGNOSIS — R278 Other lack of coordination: Secondary | ICD-10-CM | POA: Diagnosis not present

## 2022-01-03 DIAGNOSIS — R41841 Cognitive communication deficit: Secondary | ICD-10-CM | POA: Diagnosis not present

## 2022-01-03 DIAGNOSIS — M6281 Muscle weakness (generalized): Secondary | ICD-10-CM | POA: Diagnosis not present

## 2022-01-03 DIAGNOSIS — R2681 Unsteadiness on feet: Secondary | ICD-10-CM | POA: Diagnosis not present

## 2022-01-03 DIAGNOSIS — F02A18 Dementia in other diseases classified elsewhere, mild, with other behavioral disturbance: Secondary | ICD-10-CM | POA: Diagnosis not present

## 2022-01-03 DIAGNOSIS — M5459 Other low back pain: Secondary | ICD-10-CM | POA: Diagnosis not present

## 2022-01-04 DIAGNOSIS — R41841 Cognitive communication deficit: Secondary | ICD-10-CM | POA: Diagnosis not present

## 2022-01-04 DIAGNOSIS — R2681 Unsteadiness on feet: Secondary | ICD-10-CM | POA: Diagnosis not present

## 2022-01-04 DIAGNOSIS — F02A18 Dementia in other diseases classified elsewhere, mild, with other behavioral disturbance: Secondary | ICD-10-CM | POA: Diagnosis not present

## 2022-01-04 DIAGNOSIS — M6281 Muscle weakness (generalized): Secondary | ICD-10-CM | POA: Diagnosis not present

## 2022-01-04 DIAGNOSIS — M5459 Other low back pain: Secondary | ICD-10-CM | POA: Diagnosis not present

## 2022-01-04 DIAGNOSIS — R278 Other lack of coordination: Secondary | ICD-10-CM | POA: Diagnosis not present

## 2022-01-05 DIAGNOSIS — R278 Other lack of coordination: Secondary | ICD-10-CM | POA: Diagnosis not present

## 2022-01-05 DIAGNOSIS — R2681 Unsteadiness on feet: Secondary | ICD-10-CM | POA: Diagnosis not present

## 2022-01-05 DIAGNOSIS — F02A18 Dementia in other diseases classified elsewhere, mild, with other behavioral disturbance: Secondary | ICD-10-CM | POA: Diagnosis not present

## 2022-01-05 DIAGNOSIS — M6281 Muscle weakness (generalized): Secondary | ICD-10-CM | POA: Diagnosis not present

## 2022-01-05 DIAGNOSIS — R41841 Cognitive communication deficit: Secondary | ICD-10-CM | POA: Diagnosis not present

## 2022-01-05 DIAGNOSIS — M5459 Other low back pain: Secondary | ICD-10-CM | POA: Diagnosis not present

## 2022-01-06 DIAGNOSIS — M6281 Muscle weakness (generalized): Secondary | ICD-10-CM | POA: Diagnosis not present

## 2022-01-06 DIAGNOSIS — R41841 Cognitive communication deficit: Secondary | ICD-10-CM | POA: Diagnosis not present

## 2022-01-06 DIAGNOSIS — F02A18 Dementia in other diseases classified elsewhere, mild, with other behavioral disturbance: Secondary | ICD-10-CM | POA: Diagnosis not present

## 2022-01-06 DIAGNOSIS — R2681 Unsteadiness on feet: Secondary | ICD-10-CM | POA: Diagnosis not present

## 2022-01-06 DIAGNOSIS — M5459 Other low back pain: Secondary | ICD-10-CM | POA: Diagnosis not present

## 2022-01-06 DIAGNOSIS — R278 Other lack of coordination: Secondary | ICD-10-CM | POA: Diagnosis not present

## 2022-01-10 DIAGNOSIS — R2681 Unsteadiness on feet: Secondary | ICD-10-CM | POA: Diagnosis not present

## 2022-01-10 DIAGNOSIS — R41841 Cognitive communication deficit: Secondary | ICD-10-CM | POA: Diagnosis not present

## 2022-01-10 DIAGNOSIS — F02A18 Dementia in other diseases classified elsewhere, mild, with other behavioral disturbance: Secondary | ICD-10-CM | POA: Diagnosis not present

## 2022-01-10 DIAGNOSIS — R278 Other lack of coordination: Secondary | ICD-10-CM | POA: Diagnosis not present

## 2022-01-10 DIAGNOSIS — M6281 Muscle weakness (generalized): Secondary | ICD-10-CM | POA: Diagnosis not present

## 2022-01-10 DIAGNOSIS — M5459 Other low back pain: Secondary | ICD-10-CM | POA: Diagnosis not present

## 2022-01-11 DIAGNOSIS — F02A18 Dementia in other diseases classified elsewhere, mild, with other behavioral disturbance: Secondary | ICD-10-CM | POA: Diagnosis not present

## 2022-01-11 DIAGNOSIS — M5459 Other low back pain: Secondary | ICD-10-CM | POA: Diagnosis not present

## 2022-01-11 DIAGNOSIS — R278 Other lack of coordination: Secondary | ICD-10-CM | POA: Diagnosis not present

## 2022-01-11 DIAGNOSIS — R41841 Cognitive communication deficit: Secondary | ICD-10-CM | POA: Diagnosis not present

## 2022-01-11 DIAGNOSIS — M6281 Muscle weakness (generalized): Secondary | ICD-10-CM | POA: Diagnosis not present

## 2022-01-11 DIAGNOSIS — R2681 Unsteadiness on feet: Secondary | ICD-10-CM | POA: Diagnosis not present

## 2022-01-13 DIAGNOSIS — R278 Other lack of coordination: Secondary | ICD-10-CM | POA: Diagnosis not present

## 2022-01-13 DIAGNOSIS — F02A18 Dementia in other diseases classified elsewhere, mild, with other behavioral disturbance: Secondary | ICD-10-CM | POA: Diagnosis not present

## 2022-01-13 DIAGNOSIS — M6281 Muscle weakness (generalized): Secondary | ICD-10-CM | POA: Diagnosis not present

## 2022-01-13 DIAGNOSIS — R41841 Cognitive communication deficit: Secondary | ICD-10-CM | POA: Diagnosis not present

## 2022-01-13 DIAGNOSIS — R2681 Unsteadiness on feet: Secondary | ICD-10-CM | POA: Diagnosis not present

## 2022-01-13 DIAGNOSIS — M5459 Other low back pain: Secondary | ICD-10-CM | POA: Diagnosis not present

## 2022-01-17 DIAGNOSIS — R278 Other lack of coordination: Secondary | ICD-10-CM | POA: Diagnosis not present

## 2022-01-17 DIAGNOSIS — F02A18 Dementia in other diseases classified elsewhere, mild, with other behavioral disturbance: Secondary | ICD-10-CM | POA: Diagnosis not present

## 2022-01-17 DIAGNOSIS — M5459 Other low back pain: Secondary | ICD-10-CM | POA: Diagnosis not present

## 2022-01-17 DIAGNOSIS — M6281 Muscle weakness (generalized): Secondary | ICD-10-CM | POA: Diagnosis not present

## 2022-01-17 DIAGNOSIS — R2681 Unsteadiness on feet: Secondary | ICD-10-CM | POA: Diagnosis not present

## 2022-01-17 DIAGNOSIS — R41841 Cognitive communication deficit: Secondary | ICD-10-CM | POA: Diagnosis not present

## 2022-01-25 DIAGNOSIS — M5459 Other low back pain: Secondary | ICD-10-CM | POA: Diagnosis not present

## 2022-01-25 DIAGNOSIS — M6281 Muscle weakness (generalized): Secondary | ICD-10-CM | POA: Diagnosis not present

## 2022-01-25 DIAGNOSIS — R2681 Unsteadiness on feet: Secondary | ICD-10-CM | POA: Diagnosis not present

## 2022-01-25 DIAGNOSIS — F02A18 Dementia in other diseases classified elsewhere, mild, with other behavioral disturbance: Secondary | ICD-10-CM | POA: Diagnosis not present

## 2022-01-25 DIAGNOSIS — R278 Other lack of coordination: Secondary | ICD-10-CM | POA: Diagnosis not present

## 2022-01-25 DIAGNOSIS — R41841 Cognitive communication deficit: Secondary | ICD-10-CM | POA: Diagnosis not present

## 2022-01-26 DIAGNOSIS — R2681 Unsteadiness on feet: Secondary | ICD-10-CM | POA: Diagnosis not present

## 2022-01-26 DIAGNOSIS — F02A18 Dementia in other diseases classified elsewhere, mild, with other behavioral disturbance: Secondary | ICD-10-CM | POA: Diagnosis not present

## 2022-01-26 DIAGNOSIS — M5459 Other low back pain: Secondary | ICD-10-CM | POA: Diagnosis not present

## 2022-01-26 DIAGNOSIS — M6281 Muscle weakness (generalized): Secondary | ICD-10-CM | POA: Diagnosis not present

## 2022-01-26 DIAGNOSIS — R41841 Cognitive communication deficit: Secondary | ICD-10-CM | POA: Diagnosis not present

## 2022-01-26 DIAGNOSIS — R278 Other lack of coordination: Secondary | ICD-10-CM | POA: Diagnosis not present

## 2022-01-28 ENCOUNTER — Ambulatory Visit (INDEPENDENT_AMBULATORY_CARE_PROVIDER_SITE_OTHER): Payer: Medicare Other | Admitting: Family Medicine

## 2022-01-28 ENCOUNTER — Encounter: Payer: Self-pay | Admitting: Family Medicine

## 2022-01-28 VITALS — BP 128/60 | HR 71 | Temp 97.7°F | Ht 64.0 in | Wt 159.8 lb

## 2022-01-28 DIAGNOSIS — R2681 Unsteadiness on feet: Secondary | ICD-10-CM | POA: Diagnosis not present

## 2022-01-28 DIAGNOSIS — E039 Hypothyroidism, unspecified: Secondary | ICD-10-CM | POA: Diagnosis not present

## 2022-01-28 DIAGNOSIS — R41841 Cognitive communication deficit: Secondary | ICD-10-CM | POA: Diagnosis not present

## 2022-01-28 DIAGNOSIS — R609 Edema, unspecified: Secondary | ICD-10-CM

## 2022-01-28 DIAGNOSIS — I10 Essential (primary) hypertension: Secondary | ICD-10-CM | POA: Diagnosis not present

## 2022-01-28 DIAGNOSIS — E785 Hyperlipidemia, unspecified: Secondary | ICD-10-CM

## 2022-01-28 DIAGNOSIS — R0609 Other forms of dyspnea: Secondary | ICD-10-CM | POA: Diagnosis not present

## 2022-01-28 DIAGNOSIS — F02A18 Dementia in other diseases classified elsewhere, mild, with other behavioral disturbance: Secondary | ICD-10-CM | POA: Diagnosis not present

## 2022-01-28 DIAGNOSIS — M5459 Other low back pain: Secondary | ICD-10-CM | POA: Diagnosis not present

## 2022-01-28 DIAGNOSIS — M6281 Muscle weakness (generalized): Secondary | ICD-10-CM | POA: Diagnosis not present

## 2022-01-28 LAB — URINALYSIS, ROUTINE W REFLEX MICROSCOPIC
Bilirubin Urine: NEGATIVE
Hgb urine dipstick: NEGATIVE
Ketones, ur: NEGATIVE
Leukocytes,Ua: NEGATIVE
Nitrite: NEGATIVE
RBC / HPF: NONE SEEN (ref 0–?)
Specific Gravity, Urine: 1.01 (ref 1.000–1.030)
Total Protein, Urine: NEGATIVE
Urine Glucose: NEGATIVE
Urobilinogen, UA: 0.2 (ref 0.0–1.0)
pH: 6 (ref 5.0–8.0)

## 2022-01-28 LAB — CBC WITH DIFFERENTIAL/PLATELET
Basophils Absolute: 0.1 10*3/uL (ref 0.0–0.1)
Basophils Relative: 0.8 % (ref 0.0–3.0)
Eosinophils Absolute: 0.2 10*3/uL (ref 0.0–0.7)
Eosinophils Relative: 1.9 % (ref 0.0–5.0)
HCT: 40.1 % (ref 36.0–46.0)
Hemoglobin: 13.9 g/dL (ref 12.0–15.0)
Lymphocytes Relative: 17.8 % (ref 12.0–46.0)
Lymphs Abs: 1.5 10*3/uL (ref 0.7–4.0)
MCHC: 34.5 g/dL (ref 30.0–36.0)
MCV: 97.2 fl (ref 78.0–100.0)
Monocytes Absolute: 0.8 10*3/uL (ref 0.1–1.0)
Monocytes Relative: 9.3 % (ref 3.0–12.0)
Neutro Abs: 5.8 10*3/uL (ref 1.4–7.7)
Neutrophils Relative %: 70.2 % (ref 43.0–77.0)
Platelets: 311 10*3/uL (ref 150.0–400.0)
RBC: 4.13 Mil/uL (ref 3.87–5.11)
RDW: 13.7 % (ref 11.5–15.5)
WBC: 8.2 10*3/uL (ref 4.0–10.5)

## 2022-01-28 LAB — COMPREHENSIVE METABOLIC PANEL
ALT: 26 U/L (ref 0–35)
AST: 21 U/L (ref 0–37)
Albumin: 4.2 g/dL (ref 3.5–5.2)
Alkaline Phosphatase: 58 U/L (ref 39–117)
BUN: 13 mg/dL (ref 6–23)
CO2: 26 mEq/L (ref 19–32)
Calcium: 9.7 mg/dL (ref 8.4–10.5)
Chloride: 98 mEq/L (ref 96–112)
Creatinine, Ser: 0.95 mg/dL (ref 0.40–1.20)
GFR: 52.93 mL/min — ABNORMAL LOW (ref >60.00)
Glucose, Bld: 108 mg/dL — ABNORMAL HIGH (ref 70–99)
Potassium: 4.5 mEq/L (ref 3.5–5.1)
Sodium: 132 mEq/L — ABNORMAL LOW (ref 135–145)
Total Bilirubin: 0.4 mg/dL (ref 0.2–1.2)
Total Protein: 7.2 g/dL (ref 6.0–8.3)

## 2022-01-28 LAB — LIPID PANEL
Cholesterol: 235 mg/dL — ABNORMAL HIGH (ref 0–200)
HDL: 57.3 mg/dL (ref >39.00)
LDL Cholesterol: 140 mg/dL — ABNORMAL HIGH (ref 0–99)
NonHDL: 177.49
Total CHOL/HDL Ratio: 4
Triglycerides: 189 mg/dL — ABNORMAL HIGH (ref 0.0–149.0)
VLDL: 37.8 mg/dL (ref 0.0–40.0)

## 2022-01-28 LAB — BRAIN NATRIURETIC PEPTIDE: Pro B Natriuretic peptide (BNP): 60 pg/mL (ref 0.0–100.0)

## 2022-01-28 LAB — TSH: TSH: 2.58 u[IU]/mL (ref 0.35–5.50)

## 2022-01-28 NOTE — Patient Instructions (Addendum)
Flu shot (high dose) we should have these available within a month or two but please let us know if you get at outside pharmacy  reduce amlodipine to 5 mg once daily (I did not officially change prescription yet but may)  short term and try to cut down on salt intake - updating bloodwork to look for cause -also strongly considering short term fluid pill lasix/furosemide depending on labs and may update echocardiogram as well potentially  Please stop by lab before you go If you have mychart- we will send your results within 3 business days of [place] receiving them.  If you do not have mychart- we will call you about results within 5 business days of [place] receiving them.  *please also note that you will see labs on mychart as soon as they post. I will later go in and write notes on them- will say "notes from Dr. Yong Channel"   Recommended follow up: Return in about 1 month (around 02/27/2022) for followup or sooner if needed.Schedule b4 you leave.  -we may need sooner depending on swelling workup- we will be in touch- if worsening shortness of breath or further swelling let us know asap

## 2022-01-28 NOTE — Progress Notes (Signed)
Phone [phone] In person visit   Subjective:   Sara Medina is a 88 y.o. year old very pleasant female patient who presents for/with See problem oriented charting Chief Complaint  Patient presents with   Follow-up   Hyperlipidemia   Hypertension   Edema    Pt c/o bilateral ankle swelling that she noticed about 3 weeks ago.   lack of appetite    Pt c/o lack of appetite and she only nibbles but is drinking water and boost.   Past Medical History-  Patient Active Problem List   Diagnosis Date Noted   Moderate dementia, without behavioral disturbance 09/07/2021    Priority: High   Chronic diarrhea 12/30/2020    Priority: High   Neuropathy 09/06/2021    Priority: Medium    S/P lumbar fusion 01/27/2021    Priority: Medium    Burning sensation of feet 06/21/2020    Priority: Medium    Palpitations 06/30/2014    Priority: Medium    Asthma, exercise induced 08/18/2010    Priority: Medium    Hypothyroidism 05/04/2010    Priority: Medium    Hyperlipidemia 05/04/2010    Priority: Medium    Essential hypertension 05/04/2010    Priority: Medium    GERD (gastroesophageal reflux disease) 05/04/2010    Priority: Medium    Cystocele 10/02/2014    Priority: Low   Benign neoplasm of colon 05/04/2010    Priority: Low   Allergic rhinitis 05/04/2010    Priority: Low   Osteoarthritis 05/04/2010    Priority: Low   History of diverticulitis 05/04/2010    Priority: Low   Mitral valve prolapse 09/06/2021    Medications- reviewed and updated Current Outpatient Medications  Medication Sig Dispense Refill   albuterol (VENTOLIN HFA) 108 (90 Base) MCG/ACT inhaler Inhale 2 puffs into the lungs every 6 (six) hours as needed for wheezing or shortness of breath. 8 g 0   amLODipine (NORVASC) 5 MG tablet TAKE ONE TABLET BY MOUTH TWICE A DAY 180 tablet 3   busPIRone (BUSPAR) 5 MG tablet Take 1 tablet (5 mg total) by mouth 2 (two) times daily as needed. 60 tablet 5   CALCIUM PO Take 1  capsule by mouth daily.     Cholecalciferol (VITAMIN D-3) 25 MCG (1000 UT) CAPS Take 1,000 Units by mouth daily.     citalopram (CELEXA) 10 MG tablet Take 1 tablet (10 mg total) by mouth daily. 90 tablet 3   EPINEPHrine 0.3 mg/0.3 mL IJ SOAJ injection Inject 0.3 mg into the muscle as needed for anaphylaxis.      gabapentin (NEURONTIN) 300 MG capsule Take up to 4 capsules (up to '1200mg'$ ) at bedtime for cramps. 120 capsule 11   levothyroxine (SYNTHROID) 25 MCG tablet TAKE ONE TABLET BY MOUTH DAILY BEFORE BREAKFAST 90 tablet 3   memantine (NAMENDA) 10 MG tablet Take 1 tablet (10 mg total) by mouth 2 (two) times daily. 180 tablet 11   metoprolol tartrate (LOPRESSOR) 25 MG tablet TAKE TWO TABLETS BY MOUTH EVERY MORNING AND TAKE ONE TABLET BY MOUTH EVERY EVENING 270 tablet 3   OVER THE COUNTER MEDICATION Apply 1 application topically at bedtime. Topricin foot cream     pantoprazole (PROTONIX) 40 MG tablet Take 40 mg by mouth daily.     Probiotic Product (ALIGN) 4 MG CAPS Take 4 mg by mouth daily.     traMADol (ULTRAM) 50 MG tablet Take 1 tablet (50 mg total) by mouth every 8 (eight) hours as needed for  moderate pain or severe pain (of the back). 30 tablet 1   vitamin B-12 (CYANOCOBALAMIN) 500 MCG tablet Take 500 mcg by mouth 2 (two) times daily.     No current facility-administered medications for this visit.     Objective:  BP 128/60   Pulse 71   Temp 97.7 F (36.5 C)   Ht '5\' 4"'$  (1.626 m)   Wt 159 lb 12.8 oz (72.5 kg)   SpO2 94%   BMI 27.43 kg/m  Gen: NAD, resting comfortably CV: RRR no murmurs rubs or gallops Lungs: CTAB other than very faint crackles at lung bases Ext: 1+edema- perhaps mild erythema Skin: warm, dry    Assessment and Plan   #Edema/DOE S: Has noted swelling in both lower legs about 3 weeks ago.  She is on amlodipine 5 mg which could contribute - both she and daughters had noted - possible increased salt- a lot of cheese balls - baseline shortness of breath not  worsening -has seen cardiology and pulmonology in the past for shortness of breath mainly around 2021- on PFTS- DLCO reduction- pulmonology thought pointed more towards heart failure as cause. Prior advair/albuterol- reports helped some- but doesn't use much -daughter thinks may have more SOB in last few weeks. Up 20 lbs since may- but reports a lot of sweets (overdoing it she reports). Only sleeps on one pillow A/P: Edema noted- could simply be caused by heat plus amlodipine but will evaluate with CBC, CMP, TSH, UA to evaluate other causes- has reported low appetite lately perhaps albumin low  -also get BNP and if elevated may do echocardiogram . Does have grade I diastolic dysfunction noted in 2021 along with mild to moderate aortic valve regurgitation and mild to moderate aortic valve sclerosis/calcification without stenosis- may be worth updating echocardiogram if not other cause found on labs regardless. -pulmonology even listed CHF in past though I did not list today as she does not have history of requiring diuresis -I also am strongly considering lasix short term with weight gain and edema- but want to see labs first- may need to get her back into cardiology depending on findings -reduce lasix as below  -considered venous duplex with edema and some calf pain but I think its due to distension  #Sinus drainage on left side- blows nose- not a major inconvenience- will monitor  #Left great toenail- about midway down nail yellow and thickened- hold off on treatment for now- does not want lamisil due to risks   #hypertension S: medication: Amlodipine 5 mg twice daily, metoprolol 25 mg twice daily BP Readings from Last 3 Encounters:  01/28/22 128/60  11/17/21 140/70  11/13/21 (!) 151/67  A/P: Controlled. Continue current medications other than reduce amlodipine to 5 mg once daily  short term and try to cut down on salt intake    #hypothyroidism S: compliant On thyroid medication-levothyroxine  25 mcg Lab Results  Component Value Date   TSH 4.35 07/13/2021   A/P:hopefully stable- update tsh today. Continue current meds for now    # Anxiety S:Medication: Citalopram 10 mg (started for hot flashes but continued for anxiety after dementia diagnosis), buspirone 5 mg twice daily (taking daily) -some irritability no significant improvement- she does not feel particularly anxious A/P: imperfect but reasonable control- continue current meds   #Memory loss/dementia-did not tolerate oral donepezil due to worsening diarrhea- also tried patch - didn't stick well.  Is on memantine.  Following with Dr. Jaynee Eagles -morning fog at times and even more irritable-  caregivers say about an hour- she perceives 15 mins. Encouraged could try food early or cut out wine at night- evening half glass   #hyperlipidemia S: Medication:none  Lab Results  Component Value Date   CHOL 227 (H) 08/22/2019   HDL 68 08/22/2019   LDLCALC 116 (H) 08/22/2019   LDLDIRECT 162.3 03/18/2013   TRIG 251 (H) 08/22/2019   CHOLHDL 3.3 08/22/2019   A/P: update lipids although would not likely start statin  Recommended follow up: Return in about 1 month (around 02/27/2022) for followup or sooner if needed.Schedule b4 you leave. Future Appointments  Date Time Provider [place]  05/04/2022  1:00 PM Melvenia Beam, MD [facility] None  05/05/2022  2:30 PM [facility]   Lab/Order associations:   ICD-10-CM   1. Essential hypertension  I10 CBC with Differential/Platelet    Comprehensive metabolic panel    TSH    Urinalysis, Routine w reflex microscopic    2. Hypothyroidism, unspecified type  E03.9 TSH    3. Hyperlipidemia, unspecified hyperlipidemia type  E78.5 CBC with Differential/Platelet    Comprehensive metabolic panel    TSH    Lipid panel    4. Edema, unspecified type  R60.9 B Nat Peptide    5. DOE (dyspnea on exertion)  R06.09 B Nat Peptide     Return precautions advised.  Garret Reddish, MD

## 2022-02-02 DIAGNOSIS — M5459 Other low back pain: Secondary | ICD-10-CM | POA: Diagnosis not present

## 2022-02-02 DIAGNOSIS — M6281 Muscle weakness (generalized): Secondary | ICD-10-CM | POA: Diagnosis not present

## 2022-02-02 DIAGNOSIS — R2681 Unsteadiness on feet: Secondary | ICD-10-CM | POA: Diagnosis not present

## 2022-02-02 DIAGNOSIS — F02A18 Dementia in other diseases classified elsewhere, mild, with other behavioral disturbance: Secondary | ICD-10-CM | POA: Diagnosis not present

## 2022-02-02 DIAGNOSIS — R41841 Cognitive communication deficit: Secondary | ICD-10-CM | POA: Diagnosis not present

## 2022-02-03 ENCOUNTER — Encounter: Payer: Self-pay | Admitting: Family Medicine

## 2022-02-03 NOTE — Telephone Encounter (Signed)
See below

## 2022-02-04 DIAGNOSIS — R2681 Unsteadiness on feet: Secondary | ICD-10-CM | POA: Diagnosis not present

## 2022-02-04 DIAGNOSIS — M5459 Other low back pain: Secondary | ICD-10-CM | POA: Diagnosis not present

## 2022-02-04 DIAGNOSIS — F02A18 Dementia in other diseases classified elsewhere, mild, with other behavioral disturbance: Secondary | ICD-10-CM | POA: Diagnosis not present

## 2022-02-04 DIAGNOSIS — R41841 Cognitive communication deficit: Secondary | ICD-10-CM | POA: Diagnosis not present

## 2022-02-04 DIAGNOSIS — M6281 Muscle weakness (generalized): Secondary | ICD-10-CM | POA: Diagnosis not present

## 2022-02-08 DIAGNOSIS — M5459 Other low back pain: Secondary | ICD-10-CM | POA: Diagnosis not present

## 2022-02-08 DIAGNOSIS — F02A18 Dementia in other diseases classified elsewhere, mild, with other behavioral disturbance: Secondary | ICD-10-CM | POA: Diagnosis not present

## 2022-02-08 DIAGNOSIS — R2681 Unsteadiness on feet: Secondary | ICD-10-CM | POA: Diagnosis not present

## 2022-02-08 DIAGNOSIS — R41841 Cognitive communication deficit: Secondary | ICD-10-CM | POA: Diagnosis not present

## 2022-02-08 DIAGNOSIS — M6281 Muscle weakness (generalized): Secondary | ICD-10-CM | POA: Diagnosis not present

## 2022-02-10 DIAGNOSIS — M5459 Other low back pain: Secondary | ICD-10-CM | POA: Diagnosis not present

## 2022-02-10 DIAGNOSIS — R41841 Cognitive communication deficit: Secondary | ICD-10-CM | POA: Diagnosis not present

## 2022-02-10 DIAGNOSIS — F02A18 Dementia in other diseases classified elsewhere, mild, with other behavioral disturbance: Secondary | ICD-10-CM | POA: Diagnosis not present

## 2022-02-10 DIAGNOSIS — R2681 Unsteadiness on feet: Secondary | ICD-10-CM | POA: Diagnosis not present

## 2022-02-10 DIAGNOSIS — M6281 Muscle weakness (generalized): Secondary | ICD-10-CM | POA: Diagnosis not present

## 2022-02-14 DIAGNOSIS — F02A18 Dementia in other diseases classified elsewhere, mild, with other behavioral disturbance: Secondary | ICD-10-CM | POA: Diagnosis not present

## 2022-02-14 DIAGNOSIS — M5459 Other low back pain: Secondary | ICD-10-CM | POA: Diagnosis not present

## 2022-02-14 DIAGNOSIS — R2681 Unsteadiness on feet: Secondary | ICD-10-CM | POA: Diagnosis not present

## 2022-02-14 DIAGNOSIS — R41841 Cognitive communication deficit: Secondary | ICD-10-CM | POA: Diagnosis not present

## 2022-02-14 DIAGNOSIS — M6281 Muscle weakness (generalized): Secondary | ICD-10-CM | POA: Diagnosis not present

## 2022-02-15 ENCOUNTER — Encounter: Payer: Self-pay | Admitting: Family Medicine

## 2022-02-15 ENCOUNTER — Other Ambulatory Visit: Payer: Self-pay | Admitting: Internal Medicine

## 2022-02-15 DIAGNOSIS — H43813 Vitreous degeneration, bilateral: Secondary | ICD-10-CM | POA: Diagnosis not present

## 2022-02-16 DIAGNOSIS — R41841 Cognitive communication deficit: Secondary | ICD-10-CM | POA: Diagnosis not present

## 2022-02-16 DIAGNOSIS — M5459 Other low back pain: Secondary | ICD-10-CM | POA: Diagnosis not present

## 2022-02-16 DIAGNOSIS — F02A18 Dementia in other diseases classified elsewhere, mild, with other behavioral disturbance: Secondary | ICD-10-CM | POA: Diagnosis not present

## 2022-02-16 DIAGNOSIS — M6281 Muscle weakness (generalized): Secondary | ICD-10-CM | POA: Diagnosis not present

## 2022-02-16 DIAGNOSIS — R2681 Unsteadiness on feet: Secondary | ICD-10-CM | POA: Diagnosis not present

## 2022-02-17 ENCOUNTER — Other Ambulatory Visit: Payer: Self-pay | Admitting: Family Medicine

## 2022-02-17 DIAGNOSIS — R41841 Cognitive communication deficit: Secondary | ICD-10-CM | POA: Diagnosis not present

## 2022-02-17 DIAGNOSIS — M6281 Muscle weakness (generalized): Secondary | ICD-10-CM | POA: Diagnosis not present

## 2022-02-17 DIAGNOSIS — F02A18 Dementia in other diseases classified elsewhere, mild, with other behavioral disturbance: Secondary | ICD-10-CM | POA: Diagnosis not present

## 2022-02-17 DIAGNOSIS — M5459 Other low back pain: Secondary | ICD-10-CM | POA: Diagnosis not present

## 2022-02-17 DIAGNOSIS — R2681 Unsteadiness on feet: Secondary | ICD-10-CM | POA: Diagnosis not present

## 2022-02-18 ENCOUNTER — Ambulatory Visit (INDEPENDENT_AMBULATORY_CARE_PROVIDER_SITE_OTHER): Payer: Medicare Other | Admitting: Family Medicine

## 2022-02-18 VITALS — BP 152/70 | HR 66 | Temp 98.3°F | Wt 156.8 lb

## 2022-02-18 DIAGNOSIS — K5900 Constipation, unspecified: Secondary | ICD-10-CM

## 2022-02-18 DIAGNOSIS — M5459 Other low back pain: Secondary | ICD-10-CM | POA: Diagnosis not present

## 2022-02-18 DIAGNOSIS — R202 Paresthesia of skin: Secondary | ICD-10-CM

## 2022-02-18 DIAGNOSIS — M79604 Pain in right leg: Secondary | ICD-10-CM | POA: Diagnosis not present

## 2022-02-18 DIAGNOSIS — R41841 Cognitive communication deficit: Secondary | ICD-10-CM | POA: Diagnosis not present

## 2022-02-18 DIAGNOSIS — I10 Essential (primary) hypertension: Secondary | ICD-10-CM

## 2022-02-18 DIAGNOSIS — F02A18 Dementia in other diseases classified elsewhere, mild, with other behavioral disturbance: Secondary | ICD-10-CM | POA: Diagnosis not present

## 2022-02-18 DIAGNOSIS — M6281 Muscle weakness (generalized): Secondary | ICD-10-CM | POA: Diagnosis not present

## 2022-02-18 DIAGNOSIS — M79605 Pain in left leg: Secondary | ICD-10-CM

## 2022-02-18 DIAGNOSIS — R2681 Unsteadiness on feet: Secondary | ICD-10-CM | POA: Diagnosis not present

## 2022-02-18 NOTE — Progress Notes (Signed)
Subjective:    Patient ID: Sara Medina, female    DOB: 04/10/1930, 88 y.o.   MRN: [id number]  Chief Complaint  Patient presents with   Pain    Pain started in toes, started earlier this week. Now it is the foot and up the legs. Stomps feet to try and get it to go away. Happens mostly at night, pain has increased since started, is as intense as labor pains without the baby.  Has a cough, not sure if related and sob  Pt accompanied by her daughter.  HPI Patient 88 year old female with pmh sig for HTN, asthma, allergies, cystocele, HLD, hypothyroidism, depression, moderate dementia, neuropathy, palpitations, spinal stenosis s/p lumbar fusion who Dr. Yong Channel and seen today for ongoing concern.  Pt with pain, paresthesia, and discoloration of b/l LEs at night.  States legs are numb at times and pale.  Patient mentions stopping feet to see if symptoms improve.  Patient recently with constipation.  Took Ex-Lax, prune juice which resolved symptoms.  Patient's daughter is under the impression that patient's leg symptoms are related to the now resolved constipation.  States they brought a stool sample to the visit.    Patient and daughter state patient's BP has been elevated 170/66.  States she was taken off metoprolol at night 2/2 ankle edema.  Patient is not elevating LEs or wearing compression socks.  Past Medical History:  Diagnosis Date   Allergic rhinitis 05/04/2010   Asthma, exercise induced 08/18/2010    She uses the Proventil inhaler on rare occasions not on a daily basis    Benign neoplasm of colon 05/04/2010   Burning sensation of feet 06/21/2020   Chest tightness or pressure 05/13/2013   Chronic diarrhea 12/30/2020   Cystocele 10/02/2014   Essential hypertension 05/04/2010   GERD (gastroesophageal reflux disease) 05/04/2010   Pantoprazole 40- down to 20 for diarrhea    History of diverticulitis 05/04/2010   Hyperlipidemia 05/04/2010   Hypothyroidism 05/04/2010   Major depressive  disorder    Menopausal disorder 08/18/2010   When she went off the HRT she had severe mood disorder    Mitral valve prolapse    Moderate dementia, without behavioral disturbance 09/07/2021   Neuropathy    Osteoarthritis 05/04/2010   Palpitations 06/30/2014   Pneumonia    S/P lumbar fusion 01/27/2021   Spinal stenosis     Allergies  Allergen Reactions   Other Anaphylaxis, Shortness Of Breath, Swelling and Rash    ALL NUTS!!!!   Peanut-Containing Drug Products Anaphylaxis, Shortness Of Breath, Swelling and Rash   Pecan Nut (Diagnostic) Anaphylaxis, Shortness Of Breath and Rash   Penicillins Swelling    Full facial swelling Did it involve swelling of the face/tongue/throat, SOB, or low BP? Yes Did it involve sudden or severe rash/hives, skin peeling, or any reaction on the inside of your mouth or nose? Yes Did you need to seek medical attention at a hospital or doctor's office? No When did it last happen? "many years ago" If all above answers are "NO", may proceed with cephalosporin use.     ROS General: Denies fever, chills, night sweats, changes in weight, changes in appetite HEENT: Denies headaches, ear pain, changes in vision, rhinorrhea, sore throat CV: Denies CP, palpitations, SOB, orthopnea + chronic LE edema Pulm: Denies SOB, cough, wheezing GI: Denies abdominal pain, nausea, vomiting, diarrhea + constipation-now resolved GU: Denies dysuria, hematuria, frequency, vaginal discharge Msk: Denies muscle cramps, joint pains Neuro: Denies weakness, numbness, tingling  + paresthesias in  bilateral LEs Skin: Denies rashes, bruising + pale skin of legs Psych: Denies depression, anxiety, hallucinations     Objective:    Blood pressure (!) 152/70, pulse 66, temperature 98.3 F (36.8 C), temperature source Oral, weight 156 lb 12.8 oz (71.1 kg), SpO2 95 %.  Gen. Pleasant, well-nourished, in no distress, normal affect   HEENT: [place]/AT, face symmetric, conjunctiva clear, no scleral  icterus, PERRLA, EOMI, nares patent without drainage Lungs: no accessory muscle use, CTAB, no wheezes or rales Cardiovascular: RRR, no m/r/g, no peripheral edema Abdomen: BS present, soft, NT/ND Musculoskeletal: No deformities, no cyanosis or clubbing, normal tone Neuro:  A&Ox3, CN II-XII intact, normal gait Skin:  Warm, dry, intact.  Bilateral LEs warm with normal appearing color to skin   Wt Readings from Last 3 Encounters:  01/28/22 159 lb 12.8 oz (72.5 kg)  11/17/21 146 lb 2 oz (66.3 kg)  11/13/21 143 lb 1.3 oz (64.9 kg)    Lab Results  Component Value Date   WBC 8.2 01/28/2022   HGB 13.9 01/28/2022   HCT 40.1 01/28/2022   PLT 311.0 01/28/2022   GLUCOSE 108 (H) 01/28/2022   CHOL 235 (H) 01/28/2022   TRIG 189.0 (H) 01/28/2022   HDL 57.30 01/28/2022   LDLDIRECT 162.3 03/18/2013   LDLCALC 140 (H) 01/28/2022   ALT 26 01/28/2022   AST 21 01/28/2022   NA 132 (L) 01/28/2022   K 4.5 01/28/2022   CL 98 01/28/2022   CREATININE 0.95 01/28/2022   BUN 13 01/28/2022   CO2 26 01/28/2022   TSH 2.58 01/28/2022   INR 0.9 01/21/2021   HGBA1C 5.7 (H) 02/12/2021    Assessment/Plan:  Bilateral leg pain -Discussed possible causes including PVD -Lifestyle modifications encouraged to help control cholesterol -Consider taking a baby aspirin daily -For continued or worsened symptoms consider vascular consult -Given handout  Constipation, unspecified constipation type -Resolved -Discussed diet modifications to help reduce constipation symptoms  Paresthesia of both lower extremities -Left various causes including PVD, vitamin or electrolyte deficiency, radiculopathy, RLS -Consider obtaining vitamin B12 level  Essential hypertension -Elevated -Recheck BP -Patient and daughter advised to contact PCPs office for clarification on BP medication.  Per chart review patient should be taking metoprolol 25 mg twice daily and Norvasc 5 mg. -Advised to restart metoprolol dose in the evening.   Consider holding Norvasc for possible LE edema, though none noted on exam. -Continue monitoring BP at home  F/u with PCP in the next 1-2 weeks  Grier Mitts, MD

## 2022-02-18 NOTE — Patient Instructions (Addendum)
It does not appear that vitamin B12 level was checked at last visit.  Based on your symptoms it seem there are a number of factors that could be contributing.  Peripheral vascular disease can cause intermittent cold feeling, discoloration of skin, and numbness/tingling.  Controlling your cholesterol helps with this.  You can also consider taking a baby asa.  It is recommended that you follow up with your pcp for continued or worsened symptoms.  Of note per review of your PCPs last office note it states that patient should be taking metoprolol 25 mg twice a day (1 pill in the morning and 1 pill at night) along with Norvasc 5 mg for blood pressure.  Feel free to reach out to your PCPs office for further clarification of this.

## 2022-02-21 ENCOUNTER — Encounter: Payer: Self-pay | Admitting: Internal Medicine

## 2022-02-21 ENCOUNTER — Ambulatory Visit (INDEPENDENT_AMBULATORY_CARE_PROVIDER_SITE_OTHER): Payer: Medicare Other | Admitting: Internal Medicine

## 2022-02-21 VITALS — BP 151/67 | HR 67 | Temp 98.2°F | Resp 12 | Ht 64.0 in | Wt 158.0 lb

## 2022-02-21 DIAGNOSIS — M79606 Pain in leg, unspecified: Secondary | ICD-10-CM | POA: Insufficient documentation

## 2022-02-21 DIAGNOSIS — R2681 Unsteadiness on feet: Secondary | ICD-10-CM | POA: Diagnosis not present

## 2022-02-21 DIAGNOSIS — M5459 Other low back pain: Secondary | ICD-10-CM | POA: Diagnosis not present

## 2022-02-21 DIAGNOSIS — M79605 Pain in left leg: Secondary | ICD-10-CM | POA: Diagnosis not present

## 2022-02-21 DIAGNOSIS — M6281 Muscle weakness (generalized): Secondary | ICD-10-CM | POA: Diagnosis not present

## 2022-02-21 DIAGNOSIS — M79604 Pain in right leg: Secondary | ICD-10-CM

## 2022-02-21 DIAGNOSIS — R41841 Cognitive communication deficit: Secondary | ICD-10-CM | POA: Diagnosis not present

## 2022-02-21 DIAGNOSIS — F02A18 Dementia in other diseases classified elsewhere, mild, with other behavioral disturbance: Secondary | ICD-10-CM | POA: Diagnosis not present

## 2022-02-21 NOTE — Assessment & Plan Note (Addendum)
  Offered duplex and emg, but recommended MRI and flex/ext xray Even though I do not think it is peripheral vascular disease as previously suspected (due to abnormla nerve sensation midway up leg, decent pulses, and tendency to flare at rest in bed). Still I'm okay on  continuing on the aspirin for possible pad, and we will go ahead and order the MRI and the x-ray flexion and extensions after shared decision making process.  We discussed she is also allowed to go up to 1200 mg on the gabapentin and I strongly recommend changing sleeping position.

## 2022-02-21 NOTE — Patient Instructions (Addendum)
It was a pleasure seeing you today!  Today the plan is...  Pain in both lower extremities Assessment & Plan:  Offered duplex and emg, but recommended MRI and flex/ext xray Even though I do not think it is peripheral vascular disease as previously suspected (due to abnormla nerve sensation midway up leg, decent pulses, and tendency to flare at rest in bed). Still I'm okay on  continuing on the aspirin for possible pad, and we will go ahead and order the MRI and the x-ray flexion and extensions after shared decision making process.  We discussed she is also allowed to go up to 1200 mg on the gabapentin and I strongly recommend changing sleeping position.      Orders: -     MR LUMBAR SPINE WO CONTRAST; Future -     DG Lumb Spine Flex&Ext Only; Future     Loralee Pacas, MD   Return if symptoms worsen or fail to improve.   - If your condition fails to resolve or you have other questions / concerns: please contact me via phone [phone] or MyChart messaging.  - Please bring all your medicines to your next appointment. This is the best way for me to know exactly what you're taking.  - If your condition begins to worsen or become severe:  go to the ER.   IF you received an x-ray today, you will receive an invoice from [facility]. Please contact [facility] at [phone] with questions or concerns regarding your invoice.    IF you received labwork today, you will receive an invoice from [facility]. Please contact [facility] at [phone] with questions or concerns regarding your invoice.    Our billing staff will not be able to assist you with questions regarding bills from these companies.   --------------------------------------------------------------------------------------------------------------------  You will be contacted with the lab results as soon as they are available. The fastest way to get your results is to activate your My Chart account.  Instructions are located on the last page of this paperwork. If you have not heard from [place] regarding the results in 2 weeks, please contact this office. For any labs or imaging tests, we will call you if the results are significantly abnormal.  Most normal results will be posted to myChart as soon as they are available and I will comment on them there within 2-3 business days.

## 2022-02-21 NOTE — Progress Notes (Signed)
[place] at [employer]:  [phone]   Routine Medical Office Visit  Patient:  Sara Medina      Age: 88 y.o.       Sex:  female  Date:   02/21/2022  PCP:    Marin Olp, MD    [place] Provider: Loralee Pacas, MD  Assessment/Plan:   Lanina was seen today for pain in legs and pain in feet.  Pain in both lower extremities Overview: Suspect due to tweaking of back from recent falls or physical therapy. Wakes up with severe pain in feet relieved by stomping them  Assessment & Plan:  Offered duplex and emg, but recommended MRI and flex/ext xray Even though I do not think it is peripheral vascular disease as previously suspected (due to abnormla nerve sensation midway up leg, decent pulses, and tendency to flare at rest in bed). Still I'm okay on  continuing on the aspirin for possible pad, and we will go ahead and order the MRI and the x-ray flexion and extensions after shared decision making process.  We discussed she is also allowed to go up to 1200 mg on the gabapentin and I strongly recommend changing sleeping position.      Orders: -     MR LUMBAR SPINE WO CONTRAST; Future -     DG Lumb Spine Flex&Ext Only; Future     Subjective:   Sara Medina is a 88 y.o. female with PMH significant for: Past Medical History:  Diagnosis Date   Allergic rhinitis 05/04/2010   Asthma, exercise induced 08/18/2010    She uses the Proventil inhaler on rare occasions not on a daily basis    Benign neoplasm of colon 05/04/2010   Burning sensation of feet 06/21/2020   Chest tightness or pressure 05/13/2013   Chronic diarrhea 12/30/2020   Cystocele 10/02/2014   Essential hypertension 05/04/2010   GERD (gastroesophageal reflux disease) 05/04/2010   Pantoprazole 40- down to 20 for diarrhea    History of diverticulitis 05/04/2010   Hyperlipidemia 05/04/2010   Hypothyroidism 05/04/2010   Major depressive disorder    Menopausal disorder 08/18/2010   When  she went off the HRT she had severe mood disorder    Mitral valve prolapse    Moderate dementia, without behavioral disturbance 09/07/2021   Neuropathy    Osteoarthritis 05/04/2010   Palpitations 06/30/2014   Pneumonia    S/P lumbar fusion 01/27/2021   Spinal stenosis      She main concern for today's visit is: Chief Complaint  Patient presents with   Pain in legs    Unable to sleep due to this.   Pain in feet     Additional physician collected history:    Presents for follow-up and evaluation of intermittent severe leg pain which is relieved by stopping her feet has been evaluated previously and seems like the symptoms are consistent with peripheral vascular disease and she even has some information on that from the last visit.  She has not been evaluated by vascular specialist for this she has not yet to have any ultrasound to evaluate the blood flow but the feet do turn pale sometimes and she has worsening pain when she sleeps.  No diabetes.          Objective:  Physical Exam: BP (!) 151/67 (BP Location: Right Arm, Patient Position: Sitting)   Pulse 67   Temp 98.2 F (36.8 C) (Temporal)   Resp 12   Ht '5\' 4"'$  (  1.626 m)   Wt 158 lb (71.7 kg)   SpO2 95%   BMI 27.12 kg/m   She  is a polite, friendly, and genuine person Constitutional: NAD, AAO, not ill-appearing  Neuro: alert, no focal deficit obvious, articulate speech Psych: normal mood, behavior, thought content   Problem specific physical exam findings:  Good 1+ bilateral pt  pulses, no discoloration.   Severe neuropathy as evidenced from only perhaps a 10% of the entire leg below the knee can detect monofilament bilaterally    Results:  No results found for any visits on 02/21/22.   Recent Results (from the past 2160 hour(s))  CBC with Differential/Platelet     Status: None   Collection Time: 01/28/22 11:49 AM  Result Value Ref Range   WBC 8.2 4.0 - 10.5 K/uL   RBC 4.13 3.87 - 5.11 Mil/uL   Hemoglobin  13.9 12.0 - 15.0 g/dL   HCT 40.1 36.0 - 46.0 %   MCV 97.2 78.0 - 100.0 fl   MCHC 34.5 30.0 - 36.0 g/dL   RDW 13.7 11.5 - 15.5 %   Platelets 311.0 150.0 - 400.0 K/uL   Neutrophils Relative % 70.2 43.0 - 77.0 %   Lymphocytes Relative 17.8 12.0 - 46.0 %   Monocytes Relative 9.3 3.0 - 12.0 %   Eosinophils Relative 1.9 0.0 - 5.0 %   Basophils Relative 0.8 0.0 - 3.0 %   Neutro Abs 5.8 1.4 - 7.7 K/uL   Lymphs Abs 1.5 0.7 - 4.0 K/uL   Monocytes Absolute 0.8 0.1 - 1.0 K/uL   Eosinophils Absolute 0.2 0.0 - 0.7 K/uL   Basophils Absolute 0.1 0.0 - 0.1 K/uL  Comprehensive metabolic panel     Status: Abnormal   Collection Time: 01/28/22 11:49 AM  Result Value Ref Range   Sodium 132 (L) 135 - 145 mEq/L   Potassium 4.5 3.5 - 5.1 mEq/L   Chloride 98 96 - 112 mEq/L   CO2 26 19 - 32 mEq/L   Glucose, Bld 108 (H) 70 - 99 mg/dL   BUN 13 6 - 23 mg/dL   Creatinine, Ser 0.95 0.40 - 1.20 mg/dL   Total Bilirubin 0.4 0.2 - 1.2 mg/dL   Alkaline Phosphatase 58 39 - 117 U/L   AST 21 0 - 37 U/L   ALT 26 0 - 35 U/L   Total Protein 7.2 6.0 - 8.3 g/dL   Albumin 4.2 3.5 - 5.2 g/dL   GFR 52.93 (L) >60.00 mL/min    Comment: Calculated using the CKD-EPI Creatinine Equation (2021)   Calcium 9.7 8.4 - 10.5 mg/dL  TSH     Status: None   Collection Time: 01/28/22 11:49 AM  Result Value Ref Range   TSH 2.58 0.35 - 5.50 uIU/mL  Urinalysis, Routine w reflex microscopic     Status: Abnormal   Collection Time: 01/28/22 11:49 AM  Result Value Ref Range   Color, Urine YELLOW Yellow;Lt. Yellow;Straw;Dark Yellow;Amber;Green;Red;Brown   APPearance CLEAR Clear;Turbid;Slightly Cloudy;Cloudy   Specific Gravity, Urine 1.010 1.000 - 1.030   pH 6.0 5.0 - 8.0   Total Protein, Urine NEGATIVE Negative   Urine Glucose NEGATIVE Negative   Ketones, ur NEGATIVE Negative   Bilirubin Urine NEGATIVE Negative   Hgb urine dipstick NEGATIVE Negative   Urobilinogen, UA 0.2 0.0 - 1.0   Leukocytes,Ua NEGATIVE Negative   Nitrite NEGATIVE  Negative   WBC, UA 0-2/hpf 0-2/hpf   RBC / HPF none seen 0-2/hpf   Squamous Epithelial / LPF Rare(0-4/hpf)  Rare(0-4/hpf)   Bacteria, UA Few(10-50/hpf) (A) None  B Nat Peptide     Status: None   Collection Time: 01/28/22 11:49 AM  Result Value Ref Range   Pro B Natriuretic peptide (BNP) 60.0 0.0 - 100.0 pg/mL  Lipid panel     Status: Abnormal   Collection Time: 01/28/22 11:49 AM  Result Value Ref Range   Cholesterol 235 (H) 0 - 200 mg/dL    Comment: ATP III Classification       Desirable:  < 200 mg/dL               Borderline High:  200 - 239 mg/dL          High:  > = 240 mg/dL   Triglycerides 189.0 (H) 0.0 - 149.0 mg/dL    Comment: Normal:  <150 mg/dLBorderline High:  150 - 199 mg/dL   HDL 57.30 >39.00 mg/dL   VLDL 37.8 0.0 - 40.0 mg/dL   LDL Cholesterol 140 (H) 0 - 99 mg/dL   Total CHOL/HDL Ratio 4     Comment:                Men          Women1/2 Average Risk     3.4          3.3Average Risk          5.0          4.42X Average Risk          9.6          7.13X Average Risk          15.0          11.0                       NonHDL 177.49     Comment: NOTE:  Non-HDL goal should be 30 mg/dL higher than patient's LDL goal (i.e. LDL goal of < 70 mg/dL, would have non-HDL goal of < 100 mg/dL)

## 2022-02-22 ENCOUNTER — Ambulatory Visit (INDEPENDENT_AMBULATORY_CARE_PROVIDER_SITE_OTHER)
Admission: RE | Admit: 2022-02-22 | Discharge: 2022-02-22 | Disposition: A | Discharge status: Home / Self Care | Payer: Medicare Other | Source: Ambulatory Visit | Attending: Internal Medicine | Admitting: Internal Medicine

## 2022-02-22 DIAGNOSIS — M6281 Muscle weakness (generalized): Secondary | ICD-10-CM | POA: Diagnosis not present

## 2022-02-22 DIAGNOSIS — R41841 Cognitive communication deficit: Secondary | ICD-10-CM | POA: Diagnosis not present

## 2022-02-22 DIAGNOSIS — M79605 Pain in left leg: Secondary | ICD-10-CM

## 2022-02-22 DIAGNOSIS — M79604 Pain in right leg: Secondary | ICD-10-CM | POA: Diagnosis not present

## 2022-02-22 DIAGNOSIS — R2681 Unsteadiness on feet: Secondary | ICD-10-CM | POA: Diagnosis not present

## 2022-02-22 DIAGNOSIS — M545 Low back pain, unspecified: Secondary | ICD-10-CM | POA: Diagnosis not present

## 2022-02-22 DIAGNOSIS — F02A18 Dementia in other diseases classified elsewhere, mild, with other behavioral disturbance: Secondary | ICD-10-CM | POA: Diagnosis not present

## 2022-02-22 DIAGNOSIS — M5459 Other low back pain: Secondary | ICD-10-CM | POA: Diagnosis not present

## 2022-02-24 DIAGNOSIS — M5459 Other low back pain: Secondary | ICD-10-CM | POA: Diagnosis not present

## 2022-02-24 DIAGNOSIS — Z23 Encounter for immunization: Secondary | ICD-10-CM | POA: Diagnosis not present

## 2022-02-24 DIAGNOSIS — R2681 Unsteadiness on feet: Secondary | ICD-10-CM | POA: Diagnosis not present

## 2022-02-24 DIAGNOSIS — R41841 Cognitive communication deficit: Secondary | ICD-10-CM | POA: Diagnosis not present

## 2022-02-24 DIAGNOSIS — F02A18 Dementia in other diseases classified elsewhere, mild, with other behavioral disturbance: Secondary | ICD-10-CM | POA: Diagnosis not present

## 2022-02-24 DIAGNOSIS — M6281 Muscle weakness (generalized): Secondary | ICD-10-CM | POA: Diagnosis not present

## 2022-02-25 ENCOUNTER — Other Ambulatory Visit: Payer: Self-pay

## 2022-02-25 DIAGNOSIS — M79604 Pain in right leg: Secondary | ICD-10-CM

## 2022-03-01 ENCOUNTER — Ambulatory Visit (INDEPENDENT_AMBULATORY_CARE_PROVIDER_SITE_OTHER): Payer: Medicare Other | Admitting: Family Medicine

## 2022-03-01 ENCOUNTER — Encounter: Payer: Self-pay | Admitting: Family Medicine

## 2022-03-01 VITALS — BP 134/66 | HR 65 | Temp 98.2°F | Ht 64.0 in | Wt 157.6 lb

## 2022-03-01 DIAGNOSIS — E039 Hypothyroidism, unspecified: Secondary | ICD-10-CM | POA: Diagnosis not present

## 2022-03-01 DIAGNOSIS — R0609 Other forms of dyspnea: Secondary | ICD-10-CM

## 2022-03-01 DIAGNOSIS — R059 Cough, unspecified: Secondary | ICD-10-CM

## 2022-03-01 DIAGNOSIS — I10 Essential (primary) hypertension: Secondary | ICD-10-CM

## 2022-03-01 LAB — POC COVID19 BINAXNOW: SARS Coronavirus 2 Ag: NEGATIVE

## 2022-03-01 NOTE — Patient Instructions (Addendum)
Please go to [facility]  central X-ray  - located [address] across the [address] from [place] - in the basement - Hours: 8:30-5:00 PM M-F (with lunch from 12:30- 1 PM). You do NOT need an appointment.   No changes today  Recommended follow up: Return in about 1 month (around 04/01/2022) for followup or sooner if needed.Schedule b4 you leave. 3-4 months if find cause and improving

## 2022-03-01 NOTE — Progress Notes (Signed)
Phone [phone] In person visit   Subjective:   Sara Medina is a 88 y.o. year old very pleasant female patient who presents for/with See problem oriented charting Chief Complaint  Patient presents with   Follow-up    Pt states had recent fall since saw you last,  c/o cold sweats   Hypertension   Hyperlipidemia   breathing issues    Breathless when walking   Cough    Pt c/o of productive cough with yellow mucus   Past Medical History-  Patient Active Problem List   Diagnosis Date Noted   Moderate dementia, without behavioral disturbance 09/07/2021    Priority: High   Chronic diarrhea 12/30/2020    Priority: High   Neuropathy 09/06/2021    Priority: Medium    S/P lumbar fusion 01/27/2021    Priority: Medium    Burning sensation of feet 06/21/2020    Priority: Medium    Palpitations 06/30/2014    Priority: Medium    Asthma, exercise induced 08/18/2010    Priority: Medium    Hypothyroidism 05/04/2010    Priority: Medium    Hyperlipidemia 05/04/2010    Priority: Medium    Essential hypertension 05/04/2010    Priority: Medium    GERD (gastroesophageal reflux disease) 05/04/2010    Priority: Medium    Cystocele 10/02/2014    Priority: Low   Benign neoplasm of colon 05/04/2010    Priority: Low   Allergic rhinitis 05/04/2010    Priority: Low   Osteoarthritis 05/04/2010    Priority: Low   History of diverticulitis 05/04/2010    Priority: Low   Leg pain 02/21/2022   Mitral valve prolapse 09/06/2021    Medications- reviewed and updated Current Outpatient Medications  Medication Sig Dispense Refill   albuterol (VENTOLIN HFA) 108 (90 Base) MCG/ACT inhaler INHALE TWO PUFFS BY MOUTH INTO LUNGS EVERY 6 HOURS AS NEEDED FOR WHEEZING/SHORTNESS OF BREATH 18 g 4   amLODipine (NORVASC) 5 MG tablet TAKE ONE TABLET BY MOUTH TWICE A DAY 180 tablet 3   busPIRone (BUSPAR) 5 MG tablet Take 1 tablet (5 mg total) by mouth 2 (two) times daily as needed. 60 tablet 5   CALCIUM PO  Take 1 capsule by mouth daily.     Cholecalciferol (VITAMIN D-3) 25 MCG (1000 UT) CAPS Take 1,000 Units by mouth daily.     citalopram (CELEXA) 10 MG tablet Take 1 tablet (10 mg total) by mouth daily. 90 tablet 3   EPINEPHrine 0.3 mg/0.3 mL IJ SOAJ injection Inject 0.3 mg into the muscle as needed for anaphylaxis.      gabapentin (NEURONTIN) 300 MG capsule Take up to 4 capsules (up to '1200mg'$ ) at bedtime for cramps. 120 capsule 11   levothyroxine (SYNTHROID) 25 MCG tablet TAKE ONE TABLET BY MOUTH DAILY BEFORE BREAKFAST 90 tablet 3   memantine (NAMENDA) 10 MG tablet Take 1 tablet (10 mg total) by mouth 2 (two) times daily. 180 tablet 11   metoprolol tartrate (LOPRESSOR) 25 MG tablet TAKE TWO TABLETS BY MOUTH EVERY MORNING AND TAKE ONE TABLET BY MOUTH EVERY EVENING 270 tablet 3   OVER THE COUNTER MEDICATION Apply 1 application topically at bedtime. Topricin foot cream     pantoprazole (PROTONIX) 40 MG tablet Take 1 tablet (40 mg total) by mouth daily. Please call [phone] to schedule an overdue appointment for future refills. Thank you. 1st attempt. 30 tablet 0   polyethylene glycol (MIRALAX / GLYCOLAX) 17 g packet Take 17 g by mouth daily. In  the AM.     Probiotic Product (ALIGN) 4 MG CAPS Take 4 mg by mouth daily.     Pyridoxine HCl (VITAMIN B-6 PO) Take 1 tablet by mouth daily.     traMADol (ULTRAM) 50 MG tablet Take 1 tablet (50 mg total) by mouth every 8 (eight) hours as needed for moderate pain or severe pain (of the back). 30 tablet 1   vitamin B-12 (CYANOCOBALAMIN) 500 MCG tablet Take 500 mcg by mouth 2 (two) times daily.     No current facility-administered medications for this visit.     Objective:  BP 134/66   Pulse 65   Temp 98.2 F (36.8 C)   Ht '5\' 4"'$  (1.626 m)   Wt 157 lb 9.6 oz (71.5 kg)   SpO2 96%   BMI 27.05 kg/m  Gen: NAD, resting comfortably No significant sinus tenderness, oropharynx largely normal CV: RRR no murmurs rubs or gallops Lungs: CTAB no crackles,  wheeze, rhonchi Abdomen: soft/nontender/nondistended/normal bowel sounds. No rebound or guarding.  Ext: trace edema down 1+ last visit Skin: warm, dry  Results for orders placed or performed in visit on 03/01/22 (from the past 24 hour(s))  POC COVID-19     Status: None   Collection Time: 03/01/22 11:45 AM  Result Value Ref Range   SARS Coronavirus 2 Ag Negative Negative       Assessment and Plan   # Fall S: mattress topper was added after pain issues that she saw Dr. Randol Kern for but harder to get into the bed due to height and even with aid had a fall- has a stool with a handle coming.   Prior fall was at an eye doctor- they gave her a chair with wheels and it rolled out from under her- starting prior pain in lower legs that triggered back pain issues- working with physical therapy to improve her back.  A/P: We discussed the importance of fall prevention/avoidance-patient is already well aware and she and daughter are working on prevention strategies at home  # shortness of breath/cough S:patient complains of feeling dyspnea on exertion starting   in mid to early august- unfortunately in the last week has worsened- PT advised her to discuss with PCP  -last visit did cbc, cmp, tsh, ua, BNP as also having edema- largely reassuring other than mild stable hyponatremia. BNP not elevated- pulmonology had listed CHF in past though I did not list as has not had diuresis history- but did consider lasix short term -did reduce amlodipine to 5 mg daily instead of BID- but did not improve swelling and BP went up at 02/21/22 visit so restarted the 5 mg BID- does appear mildly improved - weight down 2 lbs today -not wheezing but albuterol helps with shortness of breath  Has also had productive cough with yellow mucus and nasal drainage- worsened in last few days and feels chest congestion  . Only listed as mild sinus drainage last visit. Some chillds.  A/P: Prior work-up for shortness of breath and  edema was largely unrevealing.  Did have slight nasal congestion last visit which has progressed to more significant drainage as well as chest congestion, cough-get chest x-ray to evaluate for pneumonia.  Also interested to see if any signs of fluid overload and consider Lasix if evidence of this (despite reassuring BNP) - Finally does have baseline asthma and has seen pulmonary before plus has some relief with albuterol-consider medication such as Symbicort -With recent worsening of cough did check COVID and this  was negative  #hypertension S: medication: Amlodipine 5 mg twice daily-blood pressure elevated on 5 mg just once daily, metoprolol 25 mg-takes 2 tablets in the morning and 1 tablet in the evening BP Readings from Last 3 Encounters:  03/01/22 134/66  02/21/22 (!) 151/67  02/18/22 (!) 152/70  A/P: Blood pressure is reasonably well controlled-continue current medication-unfortunately on amlodipine just once a day blood pressure trended up  #hypothyroidism S: compliant On thyroid medication-levothyroxine 25 mcg Lab Results  Component Value Date   TSH 2.58 01/28/2022    A/P: Controlled. Continue current medications.     Recommended follow up: Return in about 1 month (around 04/01/2022) for followup or sooner if needed.Schedule b4 you leave. Future Appointments  Date Time Provider [place]  03/15/2022  2:10 PM [facility] MR 1 [facility]-315MRI [facility] [facility]  03/31/2022  1:20 PM Marin Olp, MD [facility]  05/04/2022  1:00 PM Melvenia Beam, MD [facility] None  05/05/2022  2:30 PM [facility] HEALTH COACH [facility]   Lab/Order associations:   ICD-10-CM   1. Cough, unspecified type  R05.9 POC COVID-19    DG Chest 2 View    2. DOE (dyspnea on exertion)  R06.09 DG Chest 2 View    3. Essential hypertension  I10     4. Hypothyroidism, unspecified type  E03.9      Return precautions advised.  Garret Reddish, MD

## 2022-03-02 DIAGNOSIS — F02A18 Dementia in other diseases classified elsewhere, mild, with other behavioral disturbance: Secondary | ICD-10-CM | POA: Diagnosis not present

## 2022-03-02 DIAGNOSIS — M5459 Other low back pain: Secondary | ICD-10-CM | POA: Diagnosis not present

## 2022-03-02 DIAGNOSIS — M6281 Muscle weakness (generalized): Secondary | ICD-10-CM | POA: Diagnosis not present

## 2022-03-02 DIAGNOSIS — R41841 Cognitive communication deficit: Secondary | ICD-10-CM | POA: Diagnosis not present

## 2022-03-02 DIAGNOSIS — R2681 Unsteadiness on feet: Secondary | ICD-10-CM | POA: Diagnosis not present

## 2022-03-03 ENCOUNTER — Other Ambulatory Visit: Payer: Self-pay | Admitting: Family Medicine

## 2022-03-03 ENCOUNTER — Ambulatory Visit (INDEPENDENT_AMBULATORY_CARE_PROVIDER_SITE_OTHER)
Admission: RE | Admit: 2022-03-03 | Discharge: 2022-03-03 | Disposition: A | Discharge status: Home / Self Care | Payer: Medicare Other | Source: Ambulatory Visit | Attending: Family Medicine | Admitting: Family Medicine

## 2022-03-03 DIAGNOSIS — R059 Cough, unspecified: Secondary | ICD-10-CM

## 2022-03-03 DIAGNOSIS — R0602 Shortness of breath: Secondary | ICD-10-CM | POA: Diagnosis not present

## 2022-03-03 DIAGNOSIS — M6281 Muscle weakness (generalized): Secondary | ICD-10-CM | POA: Diagnosis not present

## 2022-03-03 DIAGNOSIS — F02A18 Dementia in other diseases classified elsewhere, mild, with other behavioral disturbance: Secondary | ICD-10-CM | POA: Diagnosis not present

## 2022-03-03 DIAGNOSIS — R2681 Unsteadiness on feet: Secondary | ICD-10-CM | POA: Diagnosis not present

## 2022-03-03 DIAGNOSIS — R41841 Cognitive communication deficit: Secondary | ICD-10-CM | POA: Diagnosis not present

## 2022-03-03 DIAGNOSIS — R0609 Other forms of dyspnea: Secondary | ICD-10-CM

## 2022-03-03 DIAGNOSIS — M5459 Other low back pain: Secondary | ICD-10-CM | POA: Diagnosis not present

## 2022-03-07 ENCOUNTER — Other Ambulatory Visit: Payer: Self-pay

## 2022-03-07 DIAGNOSIS — F02A18 Dementia in other diseases classified elsewhere, mild, with other behavioral disturbance: Secondary | ICD-10-CM | POA: Diagnosis not present

## 2022-03-07 DIAGNOSIS — M5459 Other low back pain: Secondary | ICD-10-CM | POA: Diagnosis not present

## 2022-03-07 DIAGNOSIS — R0602 Shortness of breath: Secondary | ICD-10-CM

## 2022-03-07 DIAGNOSIS — R41841 Cognitive communication deficit: Secondary | ICD-10-CM | POA: Diagnosis not present

## 2022-03-07 DIAGNOSIS — R2681 Unsteadiness on feet: Secondary | ICD-10-CM | POA: Diagnosis not present

## 2022-03-07 DIAGNOSIS — M6281 Muscle weakness (generalized): Secondary | ICD-10-CM | POA: Diagnosis not present

## 2022-03-09 DIAGNOSIS — R41841 Cognitive communication deficit: Secondary | ICD-10-CM | POA: Diagnosis not present

## 2022-03-09 DIAGNOSIS — M6281 Muscle weakness (generalized): Secondary | ICD-10-CM | POA: Diagnosis not present

## 2022-03-09 DIAGNOSIS — F02A18 Dementia in other diseases classified elsewhere, mild, with other behavioral disturbance: Secondary | ICD-10-CM | POA: Diagnosis not present

## 2022-03-09 DIAGNOSIS — R2681 Unsteadiness on feet: Secondary | ICD-10-CM | POA: Diagnosis not present

## 2022-03-09 DIAGNOSIS — M5459 Other low back pain: Secondary | ICD-10-CM | POA: Diagnosis not present

## 2022-03-11 ENCOUNTER — Encounter: Payer: Self-pay | Admitting: Family Medicine

## 2022-03-11 DIAGNOSIS — R2681 Unsteadiness on feet: Secondary | ICD-10-CM | POA: Diagnosis not present

## 2022-03-11 DIAGNOSIS — M6281 Muscle weakness (generalized): Secondary | ICD-10-CM | POA: Diagnosis not present

## 2022-03-11 DIAGNOSIS — M5459 Other low back pain: Secondary | ICD-10-CM | POA: Diagnosis not present

## 2022-03-11 DIAGNOSIS — F02A18 Dementia in other diseases classified elsewhere, mild, with other behavioral disturbance: Secondary | ICD-10-CM | POA: Diagnosis not present

## 2022-03-11 DIAGNOSIS — R41841 Cognitive communication deficit: Secondary | ICD-10-CM | POA: Diagnosis not present

## 2022-03-15 ENCOUNTER — Ambulatory Visit
Admission: RE | Admit: 2022-03-15 | Discharge: 2022-03-15 | Disposition: A | Discharge status: Home / Self Care | Payer: Medicare Other | Source: Ambulatory Visit | Attending: Internal Medicine | Admitting: Internal Medicine

## 2022-03-15 ENCOUNTER — Other Ambulatory Visit: Payer: Self-pay | Admitting: Internal Medicine

## 2022-03-15 DIAGNOSIS — M79604 Pain in right leg: Secondary | ICD-10-CM

## 2022-03-16 DIAGNOSIS — M5459 Other low back pain: Secondary | ICD-10-CM | POA: Diagnosis not present

## 2022-03-16 DIAGNOSIS — F02A18 Dementia in other diseases classified elsewhere, mild, with other behavioral disturbance: Secondary | ICD-10-CM | POA: Diagnosis not present

## 2022-03-16 DIAGNOSIS — R2681 Unsteadiness on feet: Secondary | ICD-10-CM | POA: Diagnosis not present

## 2022-03-16 DIAGNOSIS — R41841 Cognitive communication deficit: Secondary | ICD-10-CM | POA: Diagnosis not present

## 2022-03-16 DIAGNOSIS — M6281 Muscle weakness (generalized): Secondary | ICD-10-CM | POA: Diagnosis not present

## 2022-03-17 DIAGNOSIS — M6281 Muscle weakness (generalized): Secondary | ICD-10-CM | POA: Diagnosis not present

## 2022-03-17 DIAGNOSIS — F02A18 Dementia in other diseases classified elsewhere, mild, with other behavioral disturbance: Secondary | ICD-10-CM | POA: Diagnosis not present

## 2022-03-17 DIAGNOSIS — M5459 Other low back pain: Secondary | ICD-10-CM | POA: Diagnosis not present

## 2022-03-17 DIAGNOSIS — R2681 Unsteadiness on feet: Secondary | ICD-10-CM | POA: Diagnosis not present

## 2022-03-17 DIAGNOSIS — R41841 Cognitive communication deficit: Secondary | ICD-10-CM | POA: Diagnosis not present

## 2022-03-17 NOTE — Progress Notes (Signed)
I have reviewed your recent results and, in my medical opinion:  We ordered this MRI to evaluate my theory that your leg pain is coming from your back...Marland KitchenMarland KitchenMarland Kitchen the MRI strongly supports that theory..   So did the flex-ext xray. Both show spine alignment problems that get worse in certain spinal positions.  That likely causes pinching of nerves when its out of alignment.  I think the best path is experimenting with sleeping in spine brace and adding memory foam topper and maybe getting a bed that lets you sleep in positions that give better back support.   I am happy to refer to spine and pain clinic (or rather, refer back to, since you've had prior spinal surgery).   I doubt surgery will be offered.  Physical therapy might help by strengthening the spinal muscles and providing more support for the spine. I'm happy to refer there.  Tell me what referrals you like.  Loralee Pacas, MD  03/17/2022 8:36 PM

## 2022-03-18 ENCOUNTER — Encounter: Payer: Self-pay | Admitting: Family Medicine

## 2022-03-23 ENCOUNTER — Other Ambulatory Visit: Payer: Self-pay | Admitting: Family Medicine

## 2022-03-23 ENCOUNTER — Ambulatory Visit (INDEPENDENT_AMBULATORY_CARE_PROVIDER_SITE_OTHER): Payer: Medicare Other | Admitting: Internal Medicine

## 2022-03-23 ENCOUNTER — Encounter: Payer: Self-pay | Admitting: Internal Medicine

## 2022-03-23 VITALS — BP 130/64 | HR 78 | Temp 97.7°F | Ht 63.0 in | Wt 162.8 lb

## 2022-03-23 DIAGNOSIS — R41841 Cognitive communication deficit: Secondary | ICD-10-CM | POA: Diagnosis not present

## 2022-03-23 DIAGNOSIS — M5459 Other low back pain: Secondary | ICD-10-CM | POA: Diagnosis not present

## 2022-03-23 DIAGNOSIS — F02A18 Dementia in other diseases classified elsewhere, mild, with other behavioral disturbance: Secondary | ICD-10-CM | POA: Diagnosis not present

## 2022-03-23 DIAGNOSIS — J4540 Moderate persistent asthma, uncomplicated: Secondary | ICD-10-CM

## 2022-03-23 DIAGNOSIS — J301 Allergic rhinitis due to pollen: Secondary | ICD-10-CM

## 2022-03-23 DIAGNOSIS — M6281 Muscle weakness (generalized): Secondary | ICD-10-CM | POA: Diagnosis not present

## 2022-03-23 DIAGNOSIS — R2681 Unsteadiness on feet: Secondary | ICD-10-CM | POA: Diagnosis not present

## 2022-03-23 MED ORDER — FLUTICASONE PROPIONATE 50 MCG/ACT NA SUSP: 1.0000 | Freq: Every day | NASAL | 3 refills | Status: DC

## 2022-03-23 MED ORDER — FLUTICASONE FUROATE-VILANTEROL 200-25 MCG/ACT IN AEPB: 1.0000 | INHALATION_SPRAY | Freq: Every day | RESPIRATORY_TRACT | 5 refills | Status: DC

## 2022-03-23 NOTE — Progress Notes (Signed)
The patient has been prescribed the inhaler Breo. Inhaler technique was demonstrated to patient. The patient subsequently demonstrated correct technique.

## 2022-03-23 NOTE — Progress Notes (Signed)
B

## 2022-03-23 NOTE — Progress Notes (Signed)
Sara Medina    [id number]    11/17/1929  Primary Care Physician:Hunter, Brayton Mars, MD Date of Appointment: 03/23/2022 Established Patient Visit  Chief complaint:   Chief Complaint  Patient presents with   Consult    SOB with exertion.  Cough with yellow sputum, thick.     HPI: Sara Medina is a 88 y.o. woman who was previously seen by Dr. Carlis Abbott in 2021 for asthma and chronic rhinitis as well as hoarseness.   Interval Updates: She is new to me today and is here for follow up after 2 years. It looks like a repeat referral was placed for shortness of breath. She notes worsening shortness of breath over the last several months with productive cough. She does have an albuterol inhaler that helps. She has a hard time taking this due to a wrist injury and arthritis.   Taking albuterol on average 1-2 times/day with relief.   Not on any maintenance therapy.  Symptoms are usually with exertion. She has chronic nasal drainage and throat clearing and is always walking around with a tissue.  Denies any seasonal variation to this.   I have reviewed the patient's family social and past medical history and updated as appropriate.   Past Medical History:  Diagnosis Date   Allergic rhinitis 05/04/2010   Asthma, exercise induced 08/18/2010    She uses the Proventil inhaler on rare occasions not on a daily basis    Benign neoplasm of colon 05/04/2010   Burning sensation of feet 06/21/2020   Chest tightness or pressure 05/13/2013   Chronic diarrhea 12/30/2020   Cystocele 10/02/2014   Essential hypertension 05/04/2010   GERD (gastroesophageal reflux disease) 05/04/2010   Pantoprazole 40- down to 20 for diarrhea    History of diverticulitis 05/04/2010   Hyperlipidemia 05/04/2010   Hypothyroidism 05/04/2010   Major depressive disorder    Menopausal disorder 08/18/2010   When she went off the HRT she had severe mood disorder    Mitral valve prolapse    Moderate dementia,  without behavioral disturbance 09/07/2021   Neuropathy    Osteoarthritis 05/04/2010   Palpitations 06/30/2014   Pneumonia    S/P lumbar fusion 01/27/2021   Spinal stenosis     Past Surgical History:  Procedure Laterality Date   ABDOMINAL HYSTERECTOMY     BACK SURGERY     CATARACT EXTRACTION Bilateral    cataract surgery     COLONOSCOPY     EYE SURGERY     LAMINECTOMY WITH POSTERIOR LATERAL ARTHRODESIS LEVEL 1 N/A 01/27/2021   Procedure: Laminectomy and Foraminotomy - Lumbar Three-Four/Lumbar Four-Five, posterior fusion with fixation Lumbar Four-Five;  Surgeon: Eustace Moore, MD;  Location: [facility];  Service: Neurosurgery;  Laterality: N/A;   SHOULDER SURGERY Right    TONSILLECTOMY     trigger thumb Right    TUBAL LIGATION     WISDOM TOOTH EXTRACTION     x4    Family History  Problem Relation Age of Onset   Ovarian cancer Mother        in 30s   Heart disease Father        died at 99   Stroke Father    Allergies Father    Arthritis Father    Dementia Father    Cancer Sister        brain. 29 died   Healthy Sister    Other Brother        died  at pneumonia young   Breast cancer Maternal Aunt    Lung cancer Daughter    Colon cancer Neg Hx     Social History   Occupational History   Occupation: Retired    Comment: PE Teacher  Tobacco Use   Smoking status: Never   Smokeless tobacco: Never  Vaping Use   Vaping Use: Never used  Substance and Sexual Activity   Alcohol use: Yes    Alcohol/week: 5.0 standard drinks of alcohol    Types: 5 Glasses of wine per week   Drug use: No   Sexual activity: Not Currently     Physical Exam: Blood pressure 130/64, pulse 78, temperature 97.7 F (36.5 C), temperature source Oral, height '5\' 3"'$  (1.6 m), weight 162 lb 12.8 oz (73.8 kg), SpO2 97 %.  Gen:      No acute distress ENT:  mild nasal debris, mild cobblestoning. no nasal polyps, mucus membranes moist Lungs:    No increased respiratory effort, symmetric chest wall  excursion, clear to auscultation bilaterally, no wheezes or crackles CV:         Regular rate and rhythm; no murmurs, rubs, or gallops.  No pedal edema   Data Reviewed: Imaging: I have personally reviewed the chest xray shows no acute cardiopulmonary abnormalities.   PFTs:     Latest Ref Rng & Units 08/27/2019   11:52 AM  PFT Results  FVC-Pre L 2.95   FVC-Predicted Pre % 128   FVC-Post L 2.85   FVC-Predicted Post % 124   Pre FEV1/FVC % % 74   Post FEV1/FCV % % 77   FEV1-Pre L 2.18   FEV1-Predicted Pre % 128   FEV1-Post L 2.19   DLCO uncorrected ml/min/mmHg 14.57   DLCO UNC% % 79   DLCO corrected ml/min/mmHg 14.57   DLCO COR %Predicted % 79   DLVA Predicted % 81   TLC L 4.60   TLC % Predicted % 90   RV % Predicted % 71    I have personally reviewed the patient's PFTs and normal pulmonary function.   Labs:  Immunization status: Immunization History  Administered Date(s) Administered   Fluad Quad(high Dose 65+) 03/02/2020   Influenza Split 02/18/2011, 02/27/2012   Influenza Whole 02/23/2010   Influenza, High Dose Seasonal PF 02/24/2014, 03/09/2015, 02/11/2016, 02/13/2017, 02/07/2019, 02/23/2022   Influenza,inj,Quad PF,6+ Mos 02/12/2013   Influenza-Unspecified 02/26/2021   Moderna Covid-19 Vaccine Bivalent Booster 9yr & up 03/04/2021   Moderna Sars-Covid-2 Vaccination 06/20/2019, 07/18/2019, 03/26/2020, 09/24/2020   Pneumococcal Polysaccharide-23 03/04/2003   Td 03/10/2002   Tdap 02/27/2012   Zoster Recombinat (Shingrix) 10/21/2017, 12/25/2017, 12/25/2017   Zoster, Live 02/02/2005    External Records Personally Reviewed: cardiology, PCP, pulmonary  Assessment:  Mild persistent asthma, not well controlled Allergic rhinitis, not well controlled.    Plan/Recommendations: Start taking Breo 1 puff once a day, gargle after use with an alcohol based mouthwash to prevent thrush Continue the albuterol inhaler as needed.   For your nasal drainage start  taking: Flonase - 1 spray on each side of your nose daily.  Return to Care: Return in about 2 months (around 05/23/2022).   NLenice Llamas MD [facility]

## 2022-03-23 NOTE — Patient Instructions (Addendum)
Please schedule follow up scheduled with APP in 2 months.  If my schedule is not open yet, we will contact you with a reminder closer to that time. Please call [phone] if you haven't heard from [place] a month before.   I think your symptoms are related to asthma.  Start taking Breo 1 puff once a day, gargle after use with an alcohol based mouthwash. Continue the albuterol inhaler as needed.   For your nasal drainage start taking: Flonase - 1 spray on each side of your nose daily.  Instructions for use: If you also use a saline nasal spray or rinse, use that first. Position the head with the chin slightly tucked. Use the right hand to spray into the left nostril and the right hand to spray into the left nostril.   Point the bottle away from the septum of your nose (cartilage that divides the two sides of your nose).  Hold the nostril closed on the opposite side from where you will spray Spray once and gently sniff to pull the medicine into the higher parts of your nose.  Don't sniff too hard as the medicine will drain down the back of your throat instead. Repeat with a second spray on the same side if prescribed. Repeat on the other side of your nose.

## 2022-03-24 DIAGNOSIS — M5459 Other low back pain: Secondary | ICD-10-CM | POA: Diagnosis not present

## 2022-03-24 DIAGNOSIS — F02A18 Dementia in other diseases classified elsewhere, mild, with other behavioral disturbance: Secondary | ICD-10-CM | POA: Diagnosis not present

## 2022-03-24 DIAGNOSIS — R2681 Unsteadiness on feet: Secondary | ICD-10-CM | POA: Diagnosis not present

## 2022-03-24 DIAGNOSIS — M6281 Muscle weakness (generalized): Secondary | ICD-10-CM | POA: Diagnosis not present

## 2022-03-24 DIAGNOSIS — R41841 Cognitive communication deficit: Secondary | ICD-10-CM | POA: Diagnosis not present

## 2022-03-25 ENCOUNTER — Telehealth: Payer: Self-pay

## 2022-03-25 ENCOUNTER — Other Ambulatory Visit (HOSPITAL_COMMUNITY): Payer: Self-pay

## 2022-03-25 NOTE — Telephone Encounter (Signed)
PA request received through [facility] from [facility] for Fluticasone Furoate-Vilanterol 200-25MCG/ACT aerosol powder.  PA not needed at this time as the [employer] is covered.   Notifying the patients pharmacy to re-process with the covered alternative.   [facility] [id number] - [place], [place]  [address] [place], [place] [address]  Phone:  [phone]  Fax:  [phone]

## 2022-03-28 DIAGNOSIS — Z23 Encounter for immunization: Secondary | ICD-10-CM | POA: Diagnosis not present

## 2022-03-29 ENCOUNTER — Other Ambulatory Visit: Payer: Self-pay

## 2022-03-29 MED ORDER — TRAMADOL HCL 50 MG PO TABS: 50.0000 mg | ORAL_TABLET | Freq: Three times a day (TID) | ORAL | 1 refills | Status: DC | PRN

## 2022-03-29 NOTE — Progress Notes (Signed)
sent 

## 2022-03-30 ENCOUNTER — Other Ambulatory Visit: Payer: Self-pay | Admitting: Internal Medicine

## 2022-03-30 DIAGNOSIS — R41841 Cognitive communication deficit: Secondary | ICD-10-CM | POA: Diagnosis not present

## 2022-03-30 DIAGNOSIS — M6281 Muscle weakness (generalized): Secondary | ICD-10-CM | POA: Diagnosis not present

## 2022-03-30 DIAGNOSIS — M5459 Other low back pain: Secondary | ICD-10-CM | POA: Diagnosis not present

## 2022-03-30 DIAGNOSIS — F02A18 Dementia in other diseases classified elsewhere, mild, with other behavioral disturbance: Secondary | ICD-10-CM | POA: Diagnosis not present

## 2022-03-30 DIAGNOSIS — R2681 Unsteadiness on feet: Secondary | ICD-10-CM | POA: Diagnosis not present

## 2022-03-31 ENCOUNTER — Encounter: Payer: Self-pay | Admitting: Family Medicine

## 2022-03-31 ENCOUNTER — Ambulatory Visit (INDEPENDENT_AMBULATORY_CARE_PROVIDER_SITE_OTHER): Payer: Medicare Other | Admitting: Family Medicine

## 2022-03-31 VITALS — BP 134/86 | HR 61 | Temp 97.5°F | Ht 63.0 in | Wt 162.2 lb

## 2022-03-31 DIAGNOSIS — N3000 Acute cystitis without hematuria: Secondary | ICD-10-CM | POA: Diagnosis not present

## 2022-03-31 DIAGNOSIS — J4540 Moderate persistent asthma, uncomplicated: Secondary | ICD-10-CM

## 2022-03-31 DIAGNOSIS — F02A18 Dementia in other diseases classified elsewhere, mild, with other behavioral disturbance: Secondary | ICD-10-CM | POA: Diagnosis not present

## 2022-03-31 DIAGNOSIS — E039 Hypothyroidism, unspecified: Secondary | ICD-10-CM | POA: Diagnosis not present

## 2022-03-31 DIAGNOSIS — I10 Essential (primary) hypertension: Secondary | ICD-10-CM | POA: Diagnosis not present

## 2022-03-31 DIAGNOSIS — M6281 Muscle weakness (generalized): Secondary | ICD-10-CM | POA: Diagnosis not present

## 2022-03-31 DIAGNOSIS — M5459 Other low back pain: Secondary | ICD-10-CM | POA: Diagnosis not present

## 2022-03-31 DIAGNOSIS — R2681 Unsteadiness on feet: Secondary | ICD-10-CM | POA: Diagnosis not present

## 2022-03-31 DIAGNOSIS — R41841 Cognitive communication deficit: Secondary | ICD-10-CM | POA: Diagnosis not present

## 2022-03-31 MED ORDER — PANTOPRAZOLE SODIUM 20 MG PO TBEC: 20.0000 mg | DELAYED_RELEASE_TABLET | Freq: Every day | ORAL | 3 refills | Status: DC

## 2022-03-31 NOTE — Progress Notes (Signed)
Phone [phone] In person visit   Subjective:   KHAYLA KOPPENHAVER is a 88 y.o. year old very pleasant female patient who presents for/with See problem oriented charting Chief Complaint  Patient presents with   Follow-up    Would like urine checked due to having diarrhea at night.    right wrist pain    Pt states she fell and bookcase fell on her.   bilateral ankle swelling   Past Medical History-  Patient Active Problem List   Diagnosis Date Noted   Moderate dementia, without behavioral disturbance 09/07/2021    Priority: High   Chronic diarrhea 12/30/2020    Priority: High   Mitral valve prolapse 09/06/2021    Priority: Medium    Neuropathy 09/06/2021    Priority: Medium    S/P lumbar fusion 01/27/2021    Priority: Medium    Burning sensation of feet 06/21/2020    Priority: Medium    Palpitations 06/30/2014    Priority: Medium    Moderate persistent asthma 08/18/2010    Priority: Medium    Hypothyroidism 05/04/2010    Priority: Medium    Hyperlipidemia 05/04/2010    Priority: Medium    Essential hypertension 05/04/2010    Priority: Medium    GERD (gastroesophageal reflux disease) 05/04/2010    Priority: Medium    Cystocele 10/02/2014    Priority: Low   Benign neoplasm of colon 05/04/2010    Priority: Low   Allergic rhinitis 05/04/2010    Priority: Low   Osteoarthritis 05/04/2010    Priority: Low   History of diverticulitis 05/04/2010    Priority: Low   Leg pain 02/21/2022    Priority: 1.    Medications- reviewed and updated Current Outpatient Medications  Medication Sig Dispense Refill   albuterol (VENTOLIN HFA) 108 (90 Base) MCG/ACT inhaler INHALE TWO PUFFS BY MOUTH INTO LUNGS EVERY 6 HOURS AS NEEDED FOR WHEEZING/SHORTNESS OF BREATH 18 g 4   amLODipine (NORVASC) 5 MG tablet TAKE ONE TABLET BY MOUTH TWICE A DAY 180 tablet 3   busPIRone (BUSPAR) 5 MG tablet TAKE ONE TABLET BY MOUTH TWICE A DAY AS NEEDED 60 tablet 5   CALCIUM PO Take 1 capsule by mouth  daily.     Cholecalciferol (VITAMIN D-3) 25 MCG (1000 UT) CAPS Take 1,000 Units by mouth daily.     citalopram (CELEXA) 10 MG tablet Take 1 tablet (10 mg total) by mouth daily. 90 tablet 3   EPINEPHrine 0.3 mg/0.3 mL IJ SOAJ injection Inject 0.3 mg into the muscle as needed for anaphylaxis.      fluticasone (FLONASE) 50 MCG/ACT nasal spray Place 1 spray into both nostrils daily. 16 g 3   fluticasone furoate-vilanterol (BREO ELLIPTA) 200-25 MCG/ACT AEPB Inhale 1 puff into the lungs daily. 1 each 5   gabapentin (NEURONTIN) 300 MG capsule Take up to 4 capsules (up to '1200mg'$ ) at bedtime for cramps. 120 capsule 11   levothyroxine (SYNTHROID) 25 MCG tablet TAKE ONE TABLET BY MOUTH DAILY BEFORE BREAKFAST 90 tablet 3   memantine (NAMENDA) 10 MG tablet Take 1 tablet (10 mg total) by mouth 2 (two) times daily. 180 tablet 11   metoprolol tartrate (LOPRESSOR) 25 MG tablet TAKE TWO TABLETS BY MOUTH EVERY MORNING AND TAKE ONE TABLET BY MOUTH EVERY EVENING 270 tablet 3   OVER THE COUNTER MEDICATION Apply 1 application topically at bedtime. Topricin foot cream     pantoprazole (PROTONIX) 20 MG tablet Take 1 tablet (20 mg total) by mouth daily. [place]  tablet 3   polyethylene glycol (MIRALAX / GLYCOLAX) 17 g packet Take 17 g by mouth daily. In the AM.     Probiotic Product (ALIGN) 4 MG CAPS Take 4 mg by mouth daily.     Pyridoxine HCl (VITAMIN B-6 PO) Take 1 tablet by mouth daily.     SPIKEVAX 50 MCG/0.5ML SUSP Inject into the muscle as directed. Done at [facility]     traMADol (ULTRAM) 50 MG tablet Take 1 tablet (50 mg total) by mouth every 8 (eight) hours as needed for moderate pain or severe pain (of the back). 30 tablet 1   vitamin B-12 (CYANOCOBALAMIN) 500 MCG tablet Take 500 mcg by mouth 2 (two) times daily.     No current facility-administered medications for this visit.     Objective:  BP 134/86   Pulse 61   Temp (!) 97.5 F (36.4 C)   Ht '5\' 3"'$  (1.6 m)   Wt 162 lb 3.2 oz (73.6 kg)   SpO2 95%   BMI  28.73 kg/m  Gen: NAD, resting comfortably CV: RRR no murmurs rubs or gallops Lungs: CTAB no crackles, wheeze, rhonchi Ext: Trace edema bilaterally Skin: warm, dry Neuro: grossly normal, moves all extremities MSK: Patient with good range of motion of the wrist without significant pain.  No anatomic snuffbox tenderness.  No reproducible pain consistently.      Assessment and Plan   # Right wrist pain S:was standing at a desk and went to go right- and bumped into bootcase and lighter shelving fell onto her- she fell to ground and shelving landed on right wrist . Happened 3-4 weeks ago and didn't complain about pain until about 2 weeks ago and family got her to use a brace- has noted some improvement in discomfort with the brace.  A/P: Mild pain from injury 4 weeks ago that seems to be improving-she has some comfort wearing brace and can continue this-discussed if symptoms fail to continue to improve or worsen can refer to sports medicine orthopedics-offered x-ray at this time but she wanted to hold off  #Moderate persistent asthma S:has noted some improvement in SOB with recent treatments (Breo was added) from pulmonology but mildly hoarse with this and some throat clearing. Planning to get alcohol mouth wash to switsh and spit afterwards- has not been able to find yet.   Still has leg swelling but no worsening. BNP was not elevated 2 months ago. Echo 2021-normal ejection fraction, mild mitral regurgitation, mild relaxation abnormality/diastolic dysfunction-cardiology at that time recommended pulmonology consult  A/P: Shortness of breath issues seem to have improved with Breo - Edema is stable-continue to monitor-hold off on cardiology referral at this time unless worsens-could also update echocardiogram if needed   # GERD S:Medication:  pantoprazole 40 mg No recent issues- last rx was from Dr. Harrington Challenger A/P: Well-controlled-we opted to decrease to pantoprazole 20 mg-could consider transition  to Pepcid at next visit  #Concern for UTI-patient thinks constipated but aide noted some loose stools.  -family is concerned about splatter and risk of UTI- has had significant issues in the past and want to be proactive- we discussed risk of asymptomatic bacteruria- they would still like to check and treat only if develops symptoms which could be broad- confusion increase, low back pain, dysuria, frequency, urgency, fever, nausea.   #Back pain- tramadol has been helpful- we refilled #30 with 1 refill on 03/29/22 . No confusion with this.  - bought brace per Dr. Aleene Davidson suggestion- has not used consistently.  Also tried memory foam but didn't change. Does not want surgery.  Changes per MRI lumbar 03/17/22 "1. Left subarticular stenosis L2-3 unchanged. 2. Interval laminectomy L3-4. Moderate subarticular and foraminal stenosis bilaterally unchanged. Adequate decompression of the spinal canal. 3. Bilateral laminectomy L4-5. Borderline spinal stenosis. Moderate subarticular stenosis bilaterally. 4. Moderate subarticular stenosis bilaterally L5-S1."  #Covid vaccination- felt very poorly/sleepy/nauseous achy after covid shot. More confusion but improved since then. Also had flu shot  #Constipation- miralax capful once a day along with dulcolax- went to every other night -reduce to half capful miralax daily along with continue every other day dulcolax- if still loose stool can go to every other day miralax half capful. Also perhaps pause for 24-48 hours before starting back on anything  #hypertension S: medication: amlodipine 5 mg, metoprolol 2 tablets in am and 1 tablet in pm BP Readings from Last 3 Encounters:  03/31/22 134/86  03/23/22 130/64  03/01/22 134/66  A/P:  Controlled. Continue current medications.    #hypothyroidism S: compliant On thyroid medication-levothyroxine Lab Results  Component Value Date   TSH 2.58 01/28/2022   A/P: Controlled. Continue current medications.      Recommended follow up: Return in about 6 months (around 09/29/2022) for followup or sooner if needed.Schedule b4 you leave. Future Appointments  Date Time Provider [place]  05/04/2022  1:00 PM Melvenia Beam, MD [facility] None  05/05/2022  2:30 PM [facility] HEALTH COACH [facility]  05/09/2022  2:00 PM Parrett, Fonnie Mu, NP [facility] None  09/29/2022  1:00 PM Yong Channel Brayton Mars, MD [facility]    Lab/Order associations:   ICD-10-CM   1. Acute cystitis without hematuria  N30.00 Urine Culture    2. Moderate persistent asthma without complication  T36.46     3. Hypothyroidism, unspecified type  E03.9     4. Essential hypertension  I10       Meds ordered this encounter  Medications   pantoprazole (PROTONIX) 20 MG tablet    Sig: Take 1 tablet (20 mg total) by mouth daily.    Dispense:  90 tablet    Refill:  3    Return precautions advised.  Garret Reddish, MD

## 2022-03-31 NOTE — Patient Instructions (Addendum)
Team please log date from spikevax- recent covid shot given on site at [facility]- see medication list  -reduce to half capful miralax daily along with continue every other day dulcolax- if still loose stool can go to every other day miralax half capful. Also perhaps pause for 24-48 hours before starting back on anything  -if wrist fails to continue to improve let us know - we can x-ray or send to sports medicine  Please stop by lab before you go If you have mychart- we will send your results within 3 business days of [place] receiving them.  If you do not have mychart- we will call you about results within 5 business days of [place] receiving them.  *please also note that you will see labs on mychart as soon as they post. I will later go in and write notes on them- will say "notes from Dr. Yong Channel"   Recommended follow up: Return in about 6 months (around 09/29/2022) for followup or sooner if needed.Schedule b4 you leave.

## 2022-04-01 DIAGNOSIS — M5459 Other low back pain: Secondary | ICD-10-CM | POA: Diagnosis not present

## 2022-04-01 DIAGNOSIS — M6281 Muscle weakness (generalized): Secondary | ICD-10-CM | POA: Diagnosis not present

## 2022-04-01 DIAGNOSIS — R41841 Cognitive communication deficit: Secondary | ICD-10-CM | POA: Diagnosis not present

## 2022-04-01 DIAGNOSIS — R2681 Unsteadiness on feet: Secondary | ICD-10-CM | POA: Diagnosis not present

## 2022-04-01 DIAGNOSIS — F02A18 Dementia in other diseases classified elsewhere, mild, with other behavioral disturbance: Secondary | ICD-10-CM | POA: Diagnosis not present

## 2022-04-05 DIAGNOSIS — R2681 Unsteadiness on feet: Secondary | ICD-10-CM | POA: Diagnosis not present

## 2022-04-05 DIAGNOSIS — M6281 Muscle weakness (generalized): Secondary | ICD-10-CM | POA: Diagnosis not present

## 2022-04-05 DIAGNOSIS — F02A18 Dementia in other diseases classified elsewhere, mild, with other behavioral disturbance: Secondary | ICD-10-CM | POA: Diagnosis not present

## 2022-04-05 DIAGNOSIS — R41841 Cognitive communication deficit: Secondary | ICD-10-CM | POA: Diagnosis not present

## 2022-04-05 DIAGNOSIS — M5459 Other low back pain: Secondary | ICD-10-CM | POA: Diagnosis not present

## 2022-04-06 DIAGNOSIS — R41841 Cognitive communication deficit: Secondary | ICD-10-CM | POA: Diagnosis not present

## 2022-04-06 DIAGNOSIS — M5459 Other low back pain: Secondary | ICD-10-CM | POA: Diagnosis not present

## 2022-04-06 DIAGNOSIS — M6281 Muscle weakness (generalized): Secondary | ICD-10-CM | POA: Diagnosis not present

## 2022-04-06 DIAGNOSIS — R2681 Unsteadiness on feet: Secondary | ICD-10-CM | POA: Diagnosis not present

## 2022-04-06 DIAGNOSIS — F02A18 Dementia in other diseases classified elsewhere, mild, with other behavioral disturbance: Secondary | ICD-10-CM | POA: Diagnosis not present

## 2022-04-07 ENCOUNTER — Other Ambulatory Visit: Payer: Self-pay

## 2022-04-07 DIAGNOSIS — R41841 Cognitive communication deficit: Secondary | ICD-10-CM | POA: Diagnosis not present

## 2022-04-07 DIAGNOSIS — N3000 Acute cystitis without hematuria: Secondary | ICD-10-CM

## 2022-04-07 DIAGNOSIS — R2681 Unsteadiness on feet: Secondary | ICD-10-CM | POA: Diagnosis not present

## 2022-04-07 DIAGNOSIS — F02A18 Dementia in other diseases classified elsewhere, mild, with other behavioral disturbance: Secondary | ICD-10-CM | POA: Diagnosis not present

## 2022-04-07 DIAGNOSIS — M5459 Other low back pain: Secondary | ICD-10-CM | POA: Diagnosis not present

## 2022-04-07 DIAGNOSIS — M6281 Muscle weakness (generalized): Secondary | ICD-10-CM | POA: Diagnosis not present

## 2022-04-08 ENCOUNTER — Telehealth: Payer: Self-pay | Admitting: Family Medicine

## 2022-04-08 NOTE — Telephone Encounter (Signed)
Thank you :)

## 2022-04-08 NOTE — Telephone Encounter (Signed)
Spoke with pt's daughter. They would rather not come due to it being an ordeal just to get her here. She has not been experiencing any symptoms. She stated it was only ordered out of precaution.

## 2022-04-08 NOTE — Telephone Encounter (Signed)
FYI, regarding overdue urine culture that you sent me.

## 2022-04-12 DIAGNOSIS — M6281 Muscle weakness (generalized): Secondary | ICD-10-CM | POA: Diagnosis not present

## 2022-04-12 DIAGNOSIS — F02A18 Dementia in other diseases classified elsewhere, mild, with other behavioral disturbance: Secondary | ICD-10-CM | POA: Diagnosis not present

## 2022-04-12 DIAGNOSIS — R41841 Cognitive communication deficit: Secondary | ICD-10-CM | POA: Diagnosis not present

## 2022-04-12 DIAGNOSIS — R2681 Unsteadiness on feet: Secondary | ICD-10-CM | POA: Diagnosis not present

## 2022-04-12 DIAGNOSIS — M5459 Other low back pain: Secondary | ICD-10-CM | POA: Diagnosis not present

## 2022-04-15 DIAGNOSIS — F02A18 Dementia in other diseases classified elsewhere, mild, with other behavioral disturbance: Secondary | ICD-10-CM | POA: Diagnosis not present

## 2022-04-15 DIAGNOSIS — R2681 Unsteadiness on feet: Secondary | ICD-10-CM | POA: Diagnosis not present

## 2022-04-15 DIAGNOSIS — M6281 Muscle weakness (generalized): Secondary | ICD-10-CM | POA: Diagnosis not present

## 2022-04-15 DIAGNOSIS — R41841 Cognitive communication deficit: Secondary | ICD-10-CM | POA: Diagnosis not present

## 2022-04-15 DIAGNOSIS — M5459 Other low back pain: Secondary | ICD-10-CM | POA: Diagnosis not present

## 2022-04-19 DIAGNOSIS — R41841 Cognitive communication deficit: Secondary | ICD-10-CM | POA: Diagnosis not present

## 2022-04-19 DIAGNOSIS — M5459 Other low back pain: Secondary | ICD-10-CM | POA: Diagnosis not present

## 2022-04-19 DIAGNOSIS — R2681 Unsteadiness on feet: Secondary | ICD-10-CM | POA: Diagnosis not present

## 2022-04-19 DIAGNOSIS — F02A18 Dementia in other diseases classified elsewhere, mild, with other behavioral disturbance: Secondary | ICD-10-CM | POA: Diagnosis not present

## 2022-04-19 DIAGNOSIS — M6281 Muscle weakness (generalized): Secondary | ICD-10-CM | POA: Diagnosis not present

## 2022-04-27 DIAGNOSIS — F02A18 Dementia in other diseases classified elsewhere, mild, with other behavioral disturbance: Secondary | ICD-10-CM | POA: Diagnosis not present

## 2022-04-27 DIAGNOSIS — M6281 Muscle weakness (generalized): Secondary | ICD-10-CM | POA: Diagnosis not present

## 2022-04-27 DIAGNOSIS — M5459 Other low back pain: Secondary | ICD-10-CM | POA: Diagnosis not present

## 2022-04-27 DIAGNOSIS — R41841 Cognitive communication deficit: Secondary | ICD-10-CM | POA: Diagnosis not present

## 2022-04-27 DIAGNOSIS — R2681 Unsteadiness on feet: Secondary | ICD-10-CM | POA: Diagnosis not present

## 2022-04-28 DIAGNOSIS — R41841 Cognitive communication deficit: Secondary | ICD-10-CM | POA: Diagnosis not present

## 2022-04-28 DIAGNOSIS — R2681 Unsteadiness on feet: Secondary | ICD-10-CM | POA: Diagnosis not present

## 2022-04-28 DIAGNOSIS — M5459 Other low back pain: Secondary | ICD-10-CM | POA: Diagnosis not present

## 2022-04-28 DIAGNOSIS — M6281 Muscle weakness (generalized): Secondary | ICD-10-CM | POA: Diagnosis not present

## 2022-04-28 DIAGNOSIS — F02A18 Dementia in other diseases classified elsewhere, mild, with other behavioral disturbance: Secondary | ICD-10-CM | POA: Diagnosis not present

## 2022-04-29 DIAGNOSIS — R41841 Cognitive communication deficit: Secondary | ICD-10-CM | POA: Diagnosis not present

## 2022-04-29 DIAGNOSIS — F02A18 Dementia in other diseases classified elsewhere, mild, with other behavioral disturbance: Secondary | ICD-10-CM | POA: Diagnosis not present

## 2022-04-30 IMAGING — CT CT HEAD W/O CM
4 series · 17 of 47 positions shown, 19 images · non-contrast
Comparison: 03/25/2020

CLINICAL DATA: [place]

EXAM:
CT HEAD WITHOUT CONTRAST
TECHNIQUE: Contiguous axial images were obtained from the base of the skull
through the vertex without intravenous contrast.

[Series 2: head wo · axial · 0.41mm/px · z∈[-310,-190]mm · 7 of 32 slices shown, 9 images]
[im 4/32  brain]
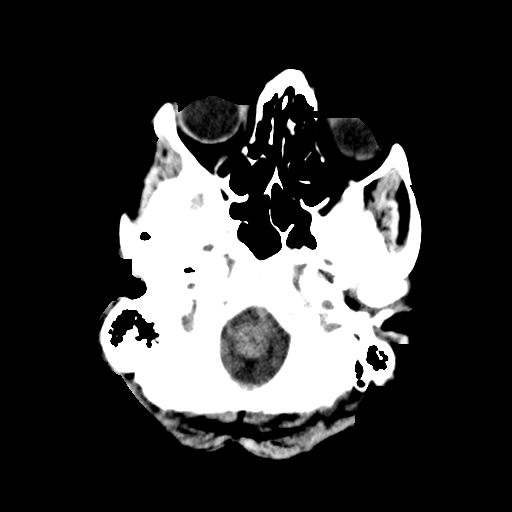
[im 4/32  bone]
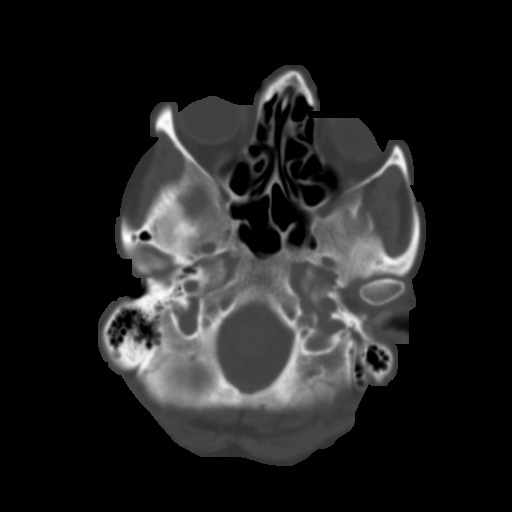
[im 8/32  brain]
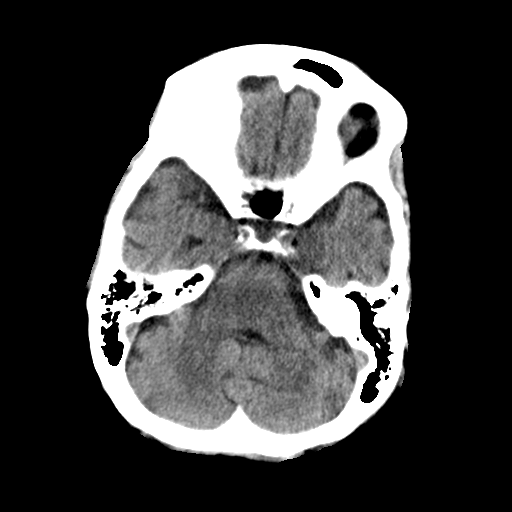
[im 12/32  brain]
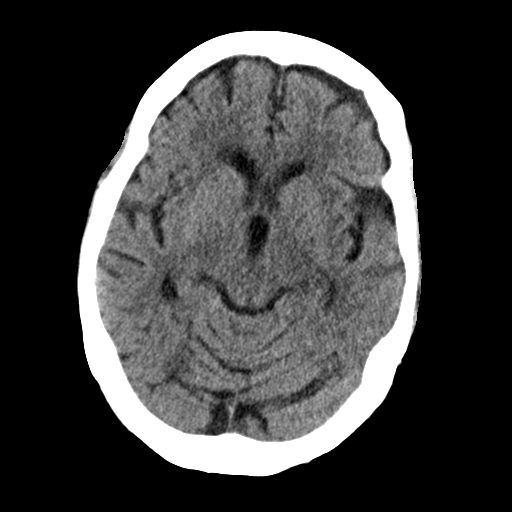
[im 16/32  brain]
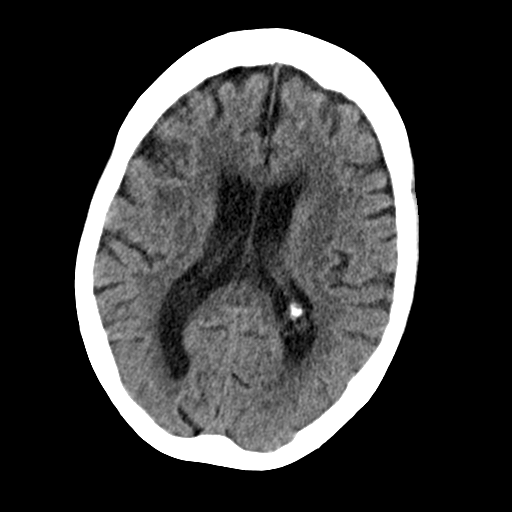
[im 20/32  brain]
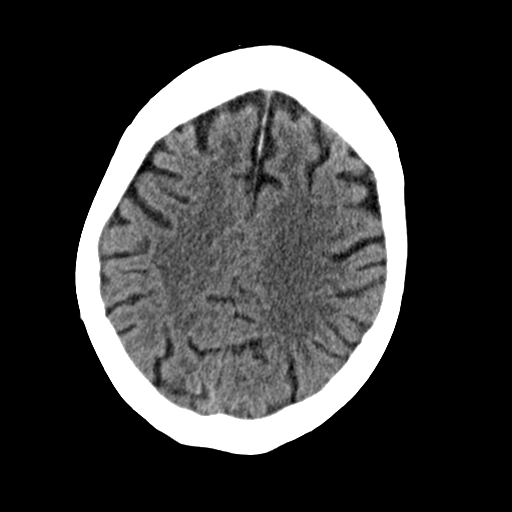
[im 20/32  bone]
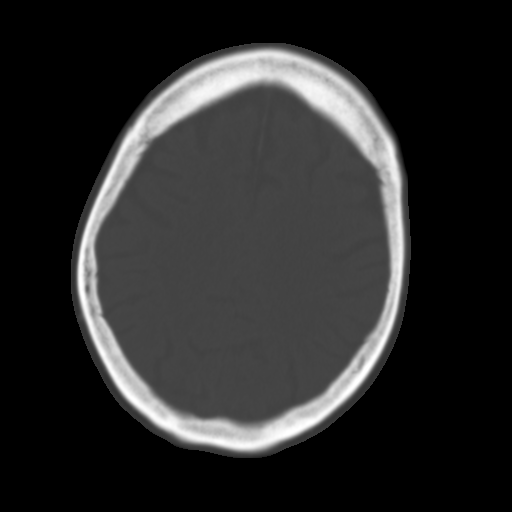
[im 24/32  brain]
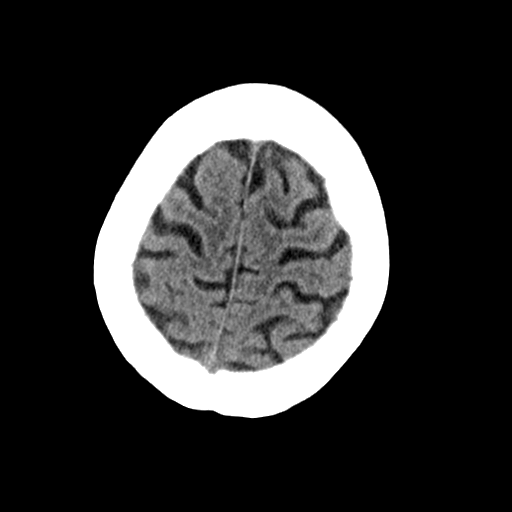
[im 28/32  brain]
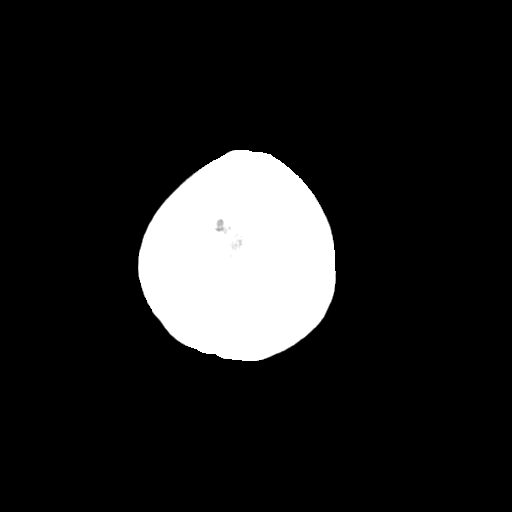

[Series 3: head bone · axial · 0.41mm/px · z∈[-311,-255]mm · 4 of 80 slices shown]
[im 8/80  bone]
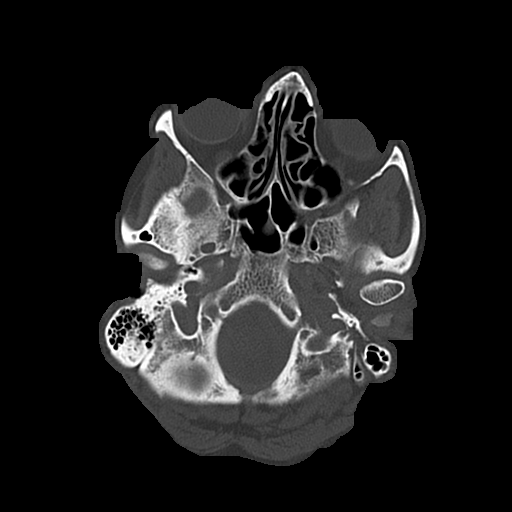
[im 16/80  bone]
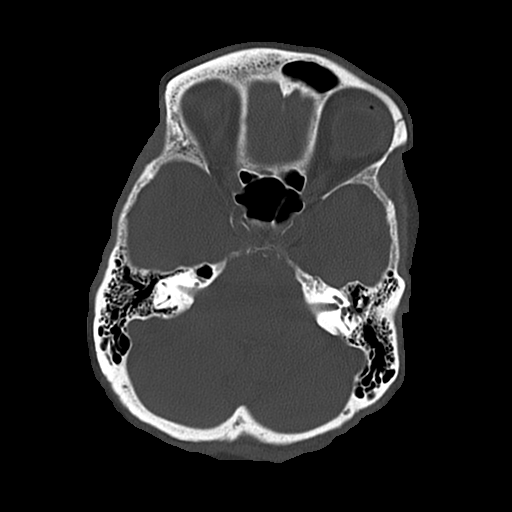
[im 24/80  bone]
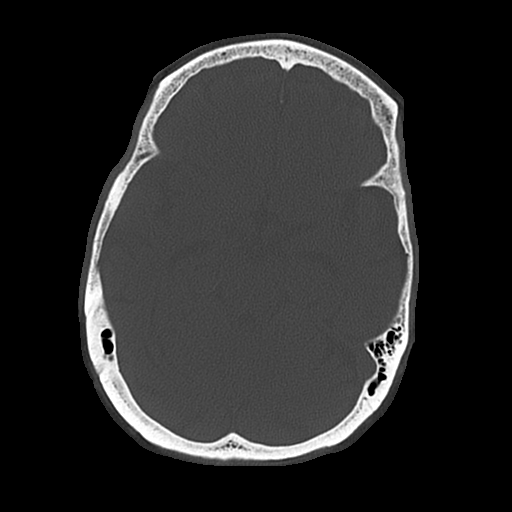
[im 36/80  bone]
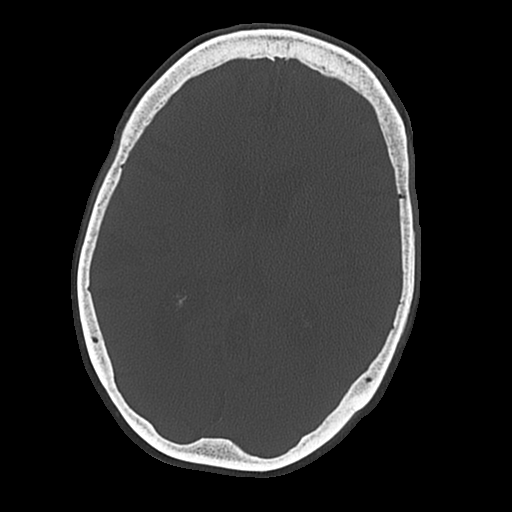

[Series 4: coronal soft · coronal · 0.31mm/px · 3 of 67 slices shown]
[im 23/67  brain]
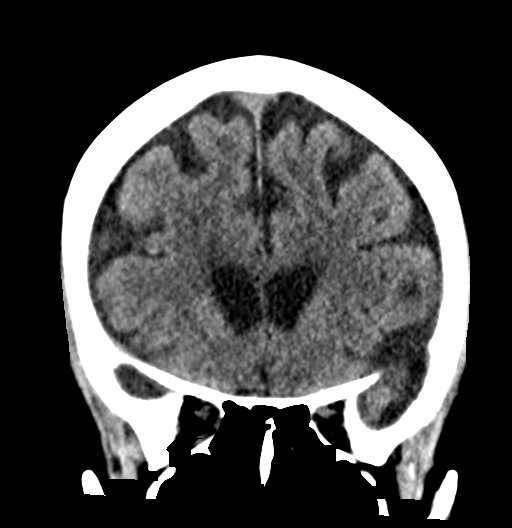
[im 30/67  brain]
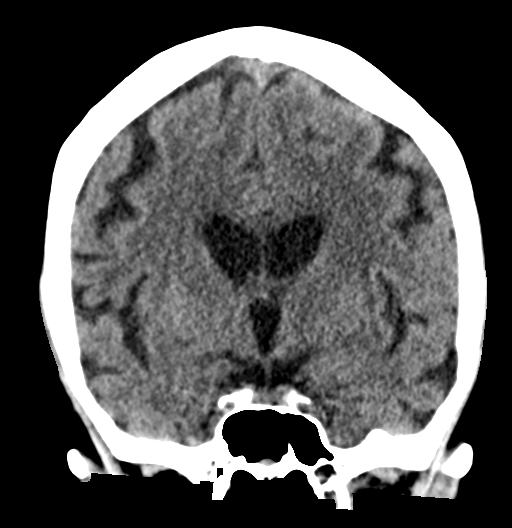
[im 37/67  brain]
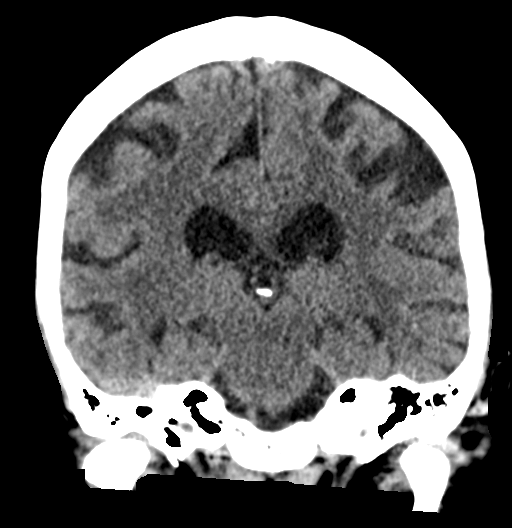

[Series 5: sagittal soft · sagittal · 0.32mm/px · 3 of 49 slices shown]
[im 17/49  brain]
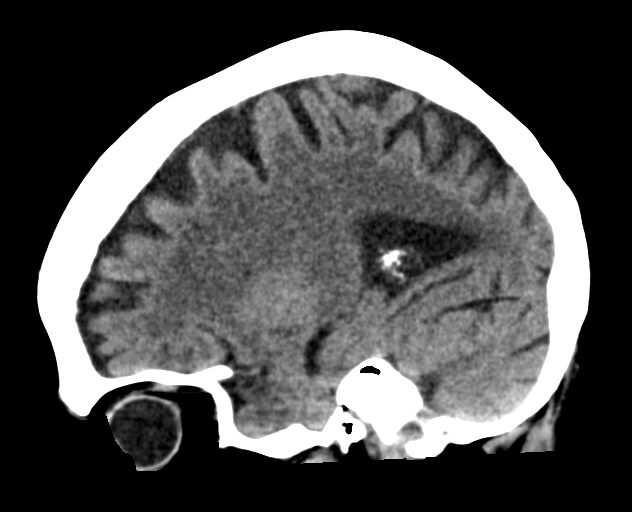
[im 25/49  brain]
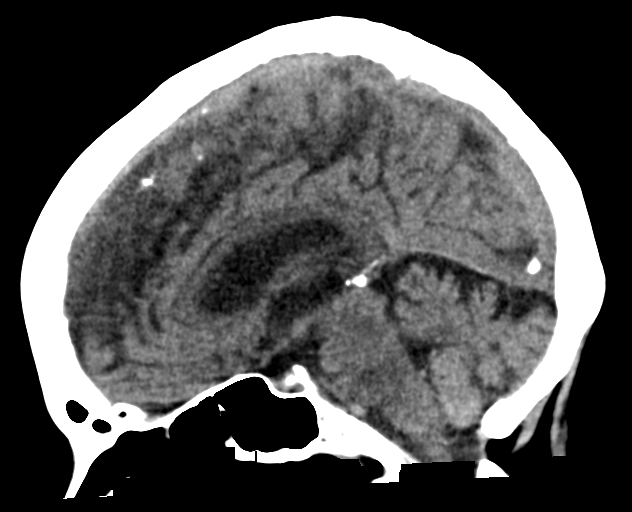
[im 33/49  brain]
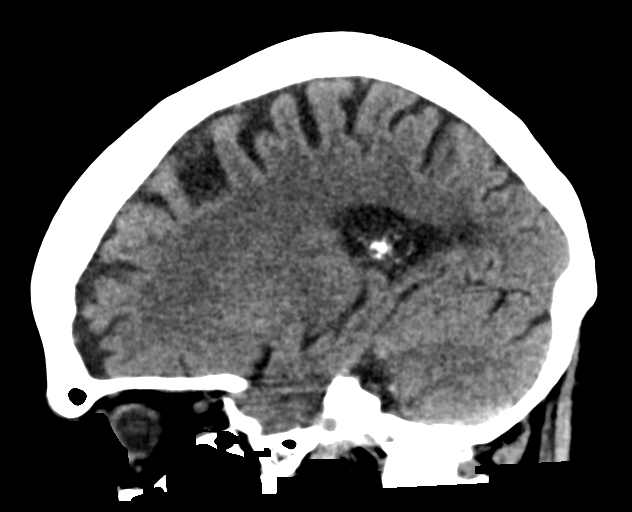

[17 of 47 positions shown; findings below may reference images not displayed]

FINDINGS: Brain: There is atrophy and chronic small vessel disease changes. No
acute intracranial abnormality. Specifically, no hemorrhage,
hydrocephalus, mass lesion, acute infarction, or significant
intracranial injury.

Vascular: No hyperdense vessel or unexpected calcification.

Skull: No acute calvarial abnormality.

Sinuses/Orbits: No acute findings

Other: None
IMPRESSION: Atrophy, chronic microvascular disease.

No acute intracranial abnormality.

## 2022-04-30 IMAGING — DX DG CHEST 2V
2 series · 2 of 2 positions shown · non-contrast
Comparison: 12/18/2020

CLINICAL DATA: Weakness, leukocytosis, concern for pneumonia

EXAM:
CHEST - 2 VIEW

[chest pa]
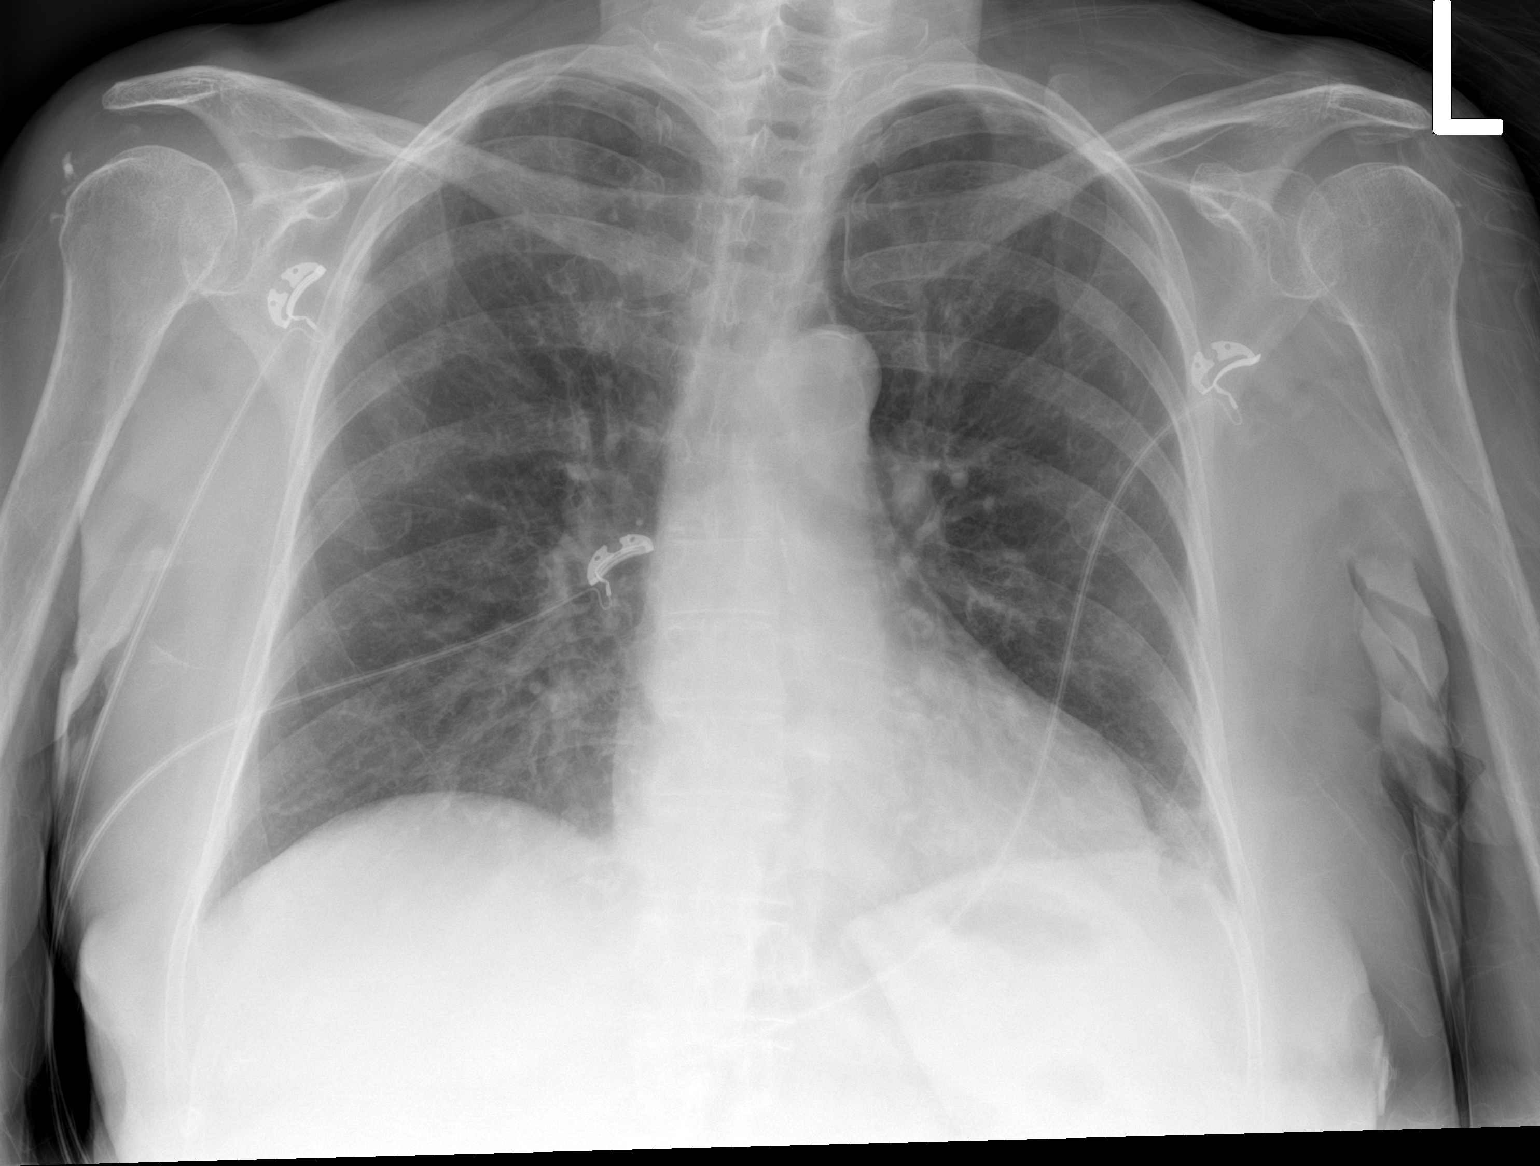

[chest lat]
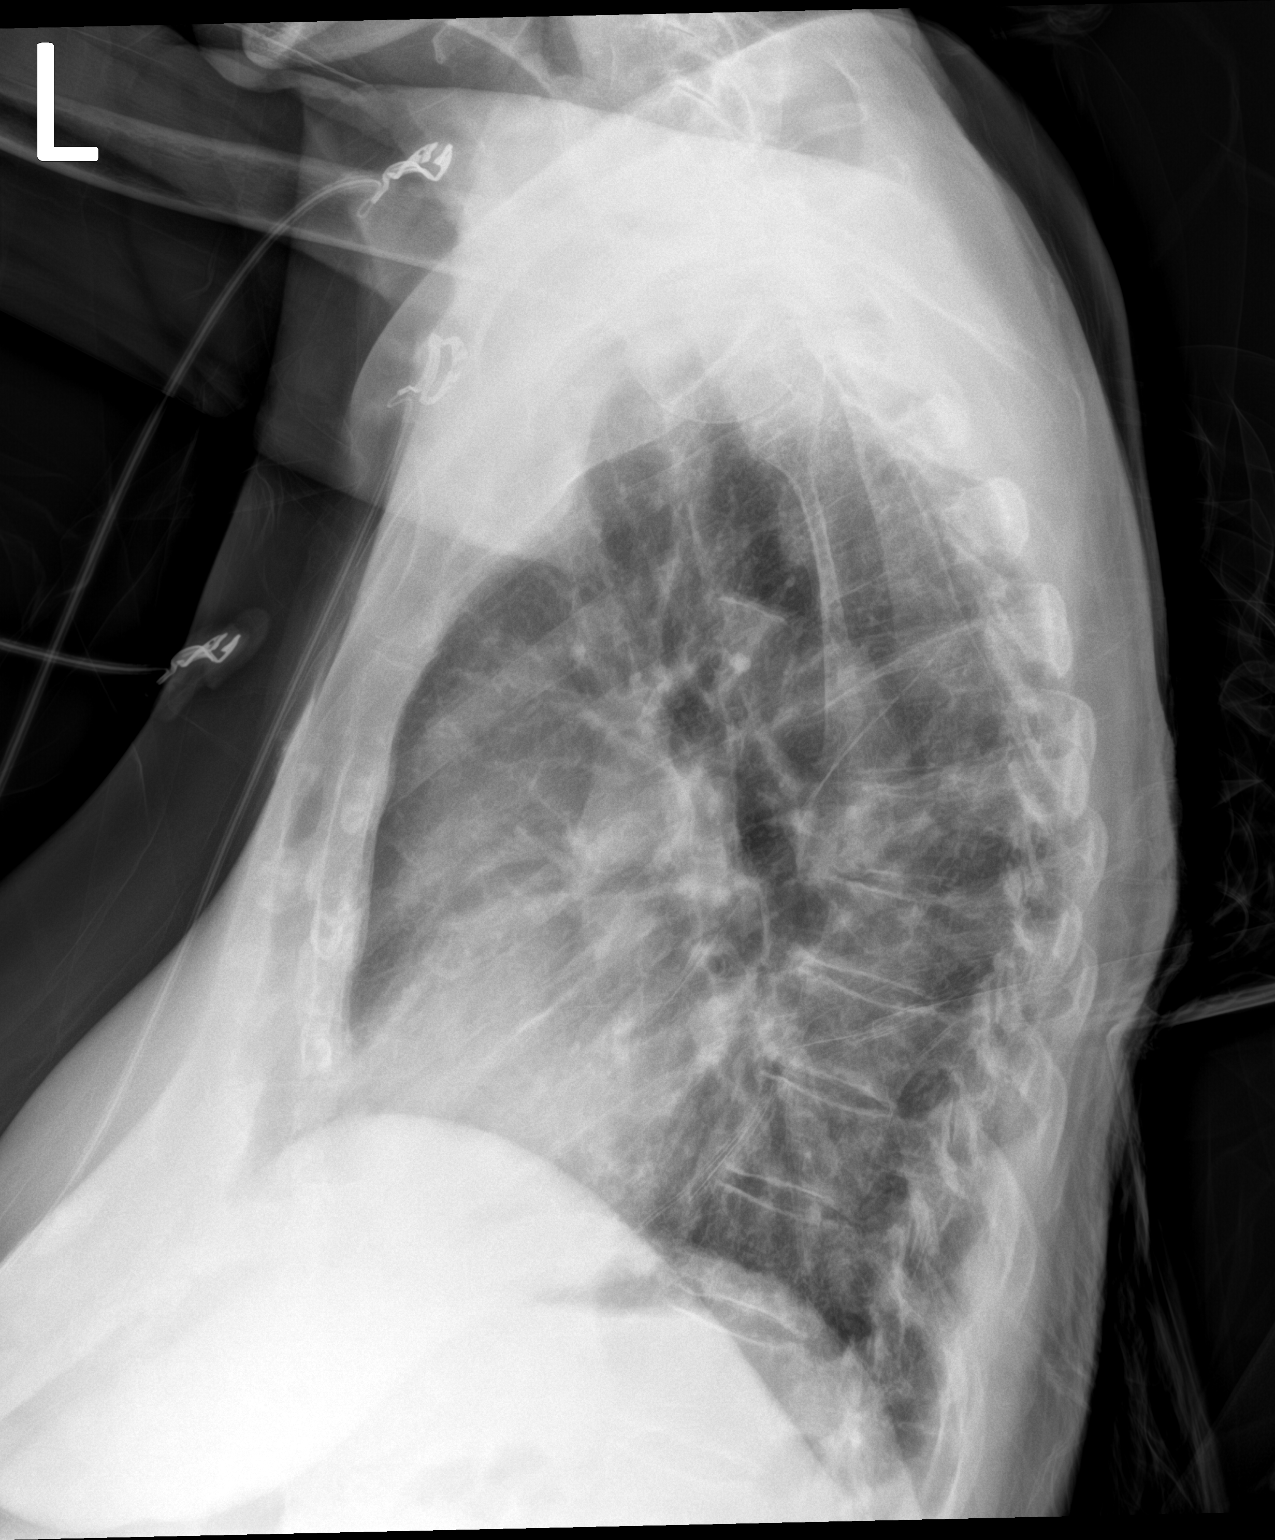

[2 of 2 positions shown; findings below may reference images not displayed]

FINDINGS: No confluent airspace opacities or effusions. Heart is normal size.
No acute bony abnormality.
IMPRESSION: No active cardiopulmonary disease.

## 2022-05-04 ENCOUNTER — Other Ambulatory Visit: Payer: Self-pay | Admitting: Family Medicine

## 2022-05-04 ENCOUNTER — Encounter: Payer: Self-pay | Admitting: Neurology

## 2022-05-04 ENCOUNTER — Ambulatory Visit (INDEPENDENT_AMBULATORY_CARE_PROVIDER_SITE_OTHER): Payer: Medicare Other | Admitting: Neurology

## 2022-05-04 VITALS — BP 148/67 | HR 64 | Ht 63.0 in | Wt 165.4 lb

## 2022-05-04 DIAGNOSIS — G309 Alzheimer's disease, unspecified: Secondary | ICD-10-CM | POA: Diagnosis not present

## 2022-05-04 DIAGNOSIS — G629 Polyneuropathy, unspecified: Secondary | ICD-10-CM | POA: Diagnosis not present

## 2022-05-04 DIAGNOSIS — R252 Cramp and spasm: Secondary | ICD-10-CM

## 2022-05-04 DIAGNOSIS — F0280 Dementia in other diseases classified elsewhere without behavioral disturbance: Secondary | ICD-10-CM | POA: Diagnosis not present

## 2022-05-04 DIAGNOSIS — M25539 Pain in unspecified wrist: Secondary | ICD-10-CM | POA: Diagnosis not present

## 2022-05-04 MED ORDER — RIVASTIGMINE TARTRATE 1.5 MG PO CAPS: ORAL_CAPSULE | ORAL | 3 refills | Status: DC

## 2022-05-04 NOTE — Progress Notes (Signed)
[id number]    Provider:  Dr Jaynee Eagles Requesting Provider: Suella Broad, MD Primary Care Provider:  Marin Olp, MD  CC:  Alzheimer's disease, neuropathy  05/04/2022: Taking memantine. She did not tolerate the aricept oral or patch. MMSE went down from 26 to 19 but she forgot her hearing aids. Daughters have not noticed any significant changes which is good. Still at [facility] and is on the list for assisted care. They have 3 aids and lots of social interaction. Wear hearing aids. Here with her 2 daughters. Discussed dementia, see assessment and plan for details.  Patient complains of symptoms per HPI as well as the following symptoms: dementia . Pertinent negatives and positives per HPI. All others negative   10/28/2021: This is an 88 year old patient who is here for follow-up after formal memory testing.  She was tested with Dr. Melvyn Novas and per his results patient meets diagnostic criteria for dementia, the underlying etiology most likely represents either an atypical Alzheimer's, presentation of Lewy body dementia without, behavioral symptoms are combination of said etiologies.  He met with daughters and patient to review all his results.  Review of chart also showed thyroid in February 2023 was normal.  Of the brain in November 2022 was unremarkable.  She was extensively tested in the past for neuropathy which included B12 which was not deficient.  I did talk with her daughters last time about the healthy brain study, and gave dementia literature, discussed "the XX brain" by Eden Emms which is a good book for them to read about decreasing risks for dementia.  The mind diet and in the future getting an FDG PET scan if needed.  Step causes problems with patient's stool so it was stopped but Namenda was started. She had a bad UTI at the time. Taking a Tai Chi class and exercising and now more socializing and exercising. No sleep apnea symptoms. Her daughter  was hit by a car and at the CT to check her out they saw a lung nodule that tuned out to be malignancy was removed, stage 1, cured. amazing story. Patient has some left calf pain, helps to stretch likely muscular due but if swelling can check for dvt, duscussed hydration.  MRI brain 04/01/2021: IMPRESSION:    MRI brain (with and without) demonstrating: - Mild atrophy and mild chronic small vessel ischemic disease. - No acute findings.  03/10/2021: 5 years ago she felt like she had a problem with playing cards and keeping score. She played it once a week. Here with daughter who provides much information. First thing she noticed, still to this day struggles with dates and times are hard for her to keep straight. She feeds the cat every morning, 3 months ago she got up and couldn't remember how to feed the cat. She has to concentrate to do things that involve multiple steps. Dauhter notices quite a few things, she is forgetting things. She has been having back issues and had minor invasive back surgery. She had problems with the anesthesia for 3 weeks. Then she had a UTI. She is repeating things. She is feeling better. She has a monitor in case she falls. She is having trouble using the phone, she wanted to go to happy hour and wanted to bring the landline phone with her. She got her cell and her land line mixed up but that was in the UTI phase.  Had an extended visit today with patient, discussed dementia, different kinds of dementia, versus  mild cognitive impairment also the role that depression and anxiety or lifestyle and genetics, medication, management, microvascular ischemia and vascular dementia and answered all questions. No other focal neurologic deficits, associated symptoms, inciting events or modifiable factors.  CT head 02/12/2021: personally reviewed images and with patient and daughter  IMPRESSION: Atrophy, chronic microvascular disease.   No acute intracranial abnormality.    HPI:  Sara Medina is a 88 y.o. female here as requested by  Suella Broad, MD for numbness of lower limb.  Past medical history severe spinal stenosis L3-L4 and L4-L5, spondylolisthesis L4-L5, bilateral burning in the feet possibly peripheral neuropathy for which she has been requested to be seen here in neurology.  She also has a history of cardiac arrhythmia, hypertension, asthma, GERD, hypothyroidism, osteoarthritis, hyperlipidemia.  She is here with her daughter who also provides much information. 25 years ago she noticed the symptoms slowly the symptoms have crept up, within the last 2-3 months in bed she is gettng cramps and feet feel on fire. They burn. Slowly progressively worsening. The Gabapentin helps a lot. Symmetrical, it hurts severely, has cramps, Gabapentin helps, Topricin helped. 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 Cramps in the toes. Burning on the bottom on the feet from the toes to the heels. No weakness. No gaot abnormalities. No other focal neurologic deficits, associated symptoms, inciting events or modifiable factors.She is extremely organized and she takes her own medications, daughters help. We discussed cameras and she doesn't love that, they all call and text. She has a calendar or she would forget about appointments. She doesn't use a scooter anymore. She is having trouble with numbers, she doesn't cook, she doesn't bake. When she was on the anesthesias she had elaborate hallucinations which she remembers in detail today. Father had dementia in his 30s, after he had strokes. Mother was sharp until her 55s.   MRI of the brain    I reviewed  Suella Broad, MD's notes: She has a past medical history of degeneration of the lumbar intervertebral discs, spinal stenosis, postoperative care, pain in the right hip joint, bilateral carpal tunnel, low back pain.  She saw Dr. Herma Mering for bilateral leg weakness and burning in the bilateral feet the feet issue is the worst, she  was started on gabapentin, and its helping, still with some low back pain, she has more than 1 problem including a crunching sensation in her neck but denies any radicular symptoms into her arms, 2% over-the-counter cream helped for about a month and it stopped working, gabapentin is giving her relief, she injured her head and went to the emergency room not too long ago they did not diagnose her with any because of that, occurred prior to taking the gabapentin, she is complaining of burning sensation in her feet, she is very active but the pain in her feet is not helping her ability to do things, she would rather walk in her carpet at home rather than walking out in the community on a hard concrete, her daughter bought her a walker, she has not had any more recent falls, she did get a bruise over her right orbital region since she fell, negative straight leg raise on the right, negative cross her leg raise on the left, she has pretty good cervical range of motion, strength is 5 out of 5, she does have severe spinal stenosis L3-L4 and L4-L5 and spondylolisthesis L4-L5, here for evaluation of peripheral neuropathy.  She  also had an L5-S1 epidural steroid injection, she had good relief for 1 day, pain is in the back of her legs but also in the front of her legs and groin.  It appears she has had 3 L5-S1 epidural steroid injections.  Reviewed notes, labs and imaging from outside physicians, which showed:  MRI of the lumbar spine December 17, 2018 shows severe L3-L4 spinal canal stenosis, moderate to severe narrowing, moderate L4-L5 spinal canal stenosis and moderate to severe narrowing, moderate to severe right and mild left L5-S1 neural foraminal narrowing with mass-effect upon the right L5 nerve root, severe spinal stenosis at L3-L4 and L4-L5 with spondylosis at L4-L5. Reviewed images.  Review of Systems: Patient complains of symptoms per HPI as well as the following symptoms: memory loss . Pertinent negatives and  positives per HPI. All others negative     Social History   Socioeconomic History   Marital status: Widowed    Spouse name: Not on file   Number of children: 3   Years of education: 18   Highest education level: Master's degree (e.g., MA, MS, MEng, MEd, MSW, MBA)  Occupational History   Occupation: Retired    Comment: PE Teacher  Tobacco Use   Smoking status: Never   Smokeless tobacco: Never  Vaping Use   Vaping Use: Never used  Substance and Sexual Activity   Alcohol use: Yes    Alcohol/week: 4.0 standard drinks of alcohol    Types: 4 Glasses of wine per week   Drug use: No   Sexual activity: Not Currently  Other Topics Concern   Not on file  Social History Narrative   Widowed around 2013. Lives alone with cat at [facility]. Has caregiver in the mornings and evenings     3 children- 2 daughters live close. 5 grandkids. 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 Was great athlete.       Hobbies: enjoys tv, wine         Social Determinants of Health   Financial Resource Strain: Low Risk  (04/29/2021)   Overall Financial Resource Strain (CARDIA)    Difficulty of Paying Living Expenses: Not hard at all  Food Insecurity: No Food Insecurity (04/29/2021)   Hunger Vital Sign    Worried About Running Out of Food in the Last Year: Never true    Ran Out of Food in the Last Year: Never true  Transportation Needs: No Transportation Needs (04/29/2021)   PRAPARE - Transportation    Lack of Transportation (Medical): No    Lack of Transportation (Non-Medical): No  Physical Activity: Insufficiently Active (04/29/2021)   Exercise Vital Sign    Days of Exercise per Week: 5 days    Minutes of Exercise per Session: 20 min  Stress: No Stress Concern Present (04/29/2021)   [employer] - Occupational Stress Questionnaire    Feeling of Stress : Not at all  Social Connections: Moderately Isolated  (04/29/2021)   Social Connection and Isolation Panel [NHANES]    Frequency of Communication with Friends and Family: Three times a week    Frequency of Social Gatherings with Friends and Family: More than three times a week    Attends Religious Services: Never    Active Member of Clubs or Organizations: Yes    Attends Club or Organization Meetings: 1 to 4 times per year    Marital Status: Widowed  Intimate Partner Violence: Not At Risk (  04/29/2021)   Humiliation, Afraid, Rape, and Kick questionnaire    Fear of Current or Ex-Partner: No    Emotionally Abused: No    Physically Abused: No    Sexually Abused: No    Family History  Problem Relation Age of Onset   Ovarian cancer Mother        in 90s   Heart disease Father        died at 91   Stroke Father    Allergies Father    Arthritis Father    Dementia Father    Cancer Sister        brain. 66 died   Healthy Sister    Other Brother        died at pneumonia young   Breast cancer Maternal Aunt    Lung cancer Daughter    Colon cancer Neg Hx    Alzheimer's disease Neg Hx     Past Medical History:  Diagnosis Date   Allergic rhinitis 05/04/2010   Asthma, exercise induced 08/18/2010    She uses the Proventil inhaler on rare occasions not on a daily basis    Benign neoplasm of colon 05/04/2010   Burning sensation of feet 06/21/2020   Chest tightness or pressure 05/13/2013   Chronic diarrhea 12/30/2020   Cystocele 10/02/2014   Essential hypertension 05/04/2010   GERD (gastroesophageal reflux disease) 05/04/2010   Pantoprazole 40- down to 20 for diarrhea    History of diverticulitis 05/04/2010   Hyperlipidemia 05/04/2010   Hypothyroidism 05/04/2010   Major depressive disorder    Menopausal disorder 08/18/2010   When she went off the HRT she had severe mood disorder    Mitral valve prolapse    Moderate dementia, without behavioral disturbance 09/07/2021   Neuropathy    Osteoarthritis 05/04/2010   Palpitations 06/30/2014    Pneumonia    S/P lumbar fusion 01/27/2021   Spinal stenosis     Patient Active Problem List   Diagnosis Date Noted   Leg pain 02/21/2022   Moderate dementia, without behavioral disturbance 09/07/2021   Mitral valve prolapse 09/06/2021   Neuropathy 09/06/2021   S/P lumbar fusion 01/27/2021   Chronic diarrhea 12/30/2020   Burning sensation of feet 06/21/2020   Cystocele 10/02/2014   Palpitations 06/30/2014   Moderate persistent asthma 08/18/2010   Benign neoplasm of colon 05/04/2010   Hypothyroidism 05/04/2010   Hyperlipidemia 05/04/2010   Essential hypertension 05/04/2010   Allergic rhinitis 05/04/2010   GERD (gastroesophageal reflux disease) 05/04/2010   Osteoarthritis 05/04/2010   History of diverticulitis 05/04/2010    Past Surgical History:  Procedure Laterality Date   ABDOMINAL HYSTERECTOMY     BACK SURGERY     CATARACT EXTRACTION Bilateral    cataract surgery     COLONOSCOPY     EYE SURGERY     LAMINECTOMY WITH POSTERIOR LATERAL ARTHRODESIS LEVEL 1 N/A 01/27/2021   Procedure: Laminectomy and Foraminotomy - Lumbar Three-Four/Lumbar Four-Five, posterior fusion with fixation Lumbar Four-Five;  Surgeon: Jones, David S, MD;  Location: [facility] OR;  Service: Neurosurgery;  Laterality: N/A;   SHOULDER SURGERY Right    TONSILLECTOMY     trigger thumb Right    TUBAL LIGATION     WISDOM TOOTH EXTRACTION     x4    Current Outpatient Medications  Medication Sig Dispense Refill   albuterol (VENTOLIN HFA) 108 (90 Base) MCG/ACT inhaler INHALE TWO PUFFS BY MOUTH INTO LUNGS EVERY 6 HOURS AS NEEDED FOR WHEEZING/SHORTNESS OF BREATH 18 g 4   amLODipine (  NORVASC) 5 MG tablet TAKE ONE TABLET BY MOUTH TWICE A DAY 180 tablet 3   busPIRone (BUSPAR) 5 MG tablet TAKE ONE TABLET BY MOUTH TWICE A DAY AS NEEDED 60 tablet 5   CALCIUM PO Take 1 capsule by mouth daily.     Cholecalciferol (VITAMIN D-3) 25 MCG (1000 UT) CAPS Take 1,000 Units by mouth daily.     citalopram (CELEXA) 10 MG tablet Take  1 tablet (10 mg total) by mouth daily. 90 tablet 3   EPINEPHrine 0.3 mg/0.3 mL IJ SOAJ injection Inject 0.3 mg into the muscle as needed for anaphylaxis.      fluticasone (FLONASE) 50 MCG/ACT nasal spray Place 1 spray into both nostrils daily. 16 g 3   fluticasone furoate-vilanterol (BREO ELLIPTA) 200-25 MCG/ACT AEPB Inhale 1 puff into the lungs daily. 1 each 5   gabapentin (NEURONTIN) 300 MG capsule Take up to 4 capsules (up to 1200mg) at bedtime for cramps. 120 capsule 11   levothyroxine (SYNTHROID) 25 MCG tablet TAKE ONE TABLET BY MOUTH DAILY BEFORE BREAKFAST 90 tablet 3   memantine (NAMENDA) 10 MG tablet Take 1 tablet (10 mg total) by mouth 2 (two) times daily. 180 tablet 11   metoprolol tartrate (LOPRESSOR) 25 MG tablet TAKE TWO TABLETS BY MOUTH EVERY MORNING AND TAKE ONE TABLET BY MOUTH EVERY EVENING 270 tablet 3   OVER THE COUNTER MEDICATION Apply 1 application topically at bedtime. Topricin foot cream     pantoprazole (PROTONIX) 20 MG tablet Take 1 tablet (20 mg total) by mouth daily. 90 tablet 3   polyethylene glycol (MIRALAX / GLYCOLAX) 17 g packet Take 17 g by mouth daily. In the AM.     Probiotic Product (ALIGN) 4 MG CAPS Take 4 mg by mouth daily.     Pyridoxine HCl (VITAMIN B-6 PO) Take 1 tablet by mouth daily.     rivastigmine (EXELON) 1.5 MG capsule Start with one pill at bedtime. And in 2 weeks if no side effects increase to twice a day. 60 capsule 3   SPIKEVAX 50 MCG/0.5ML SUSP Inject into the muscle as directed. Done at [facility]     traMADol (ULTRAM) 50 MG tablet Take 1 tablet (50 mg total) by mouth every 8 (eight) hours as needed for moderate pain or severe pain (of the back). 30 tablet 1   vitamin B-12 (CYANOCOBALAMIN) 500 MCG tablet Take 500 mcg by mouth 2 (two) times daily.     No current facility-administered medications for this visit.    Allergies as of 05/04/2022 - Review Complete 05/04/2022  Allergen Reaction Noted   Other Anaphylaxis, Shortness Of Breath,  Swelling, and Rash 08/19/2019   Peanut-containing drug products Anaphylaxis, Shortness Of Breath, Swelling, and Rash 06/21/2011   Pecan nut (diagnostic) Anaphylaxis, Shortness Of Breath, and Rash 08/19/2019   Penicillins Swelling 05/04/2010    Vitals: BP (!) 148/67   Pulse 64   Ht 5' 3" (1.6 m)   Wt 165 lb 6.4 oz (75 kg)   BMI 29.30 kg/m  Last Weight:  Wt Readings from Last 1 Encounters:  05/04/22 165 lb 6.4 oz (75 kg)   Last Height:   Ht Readings from Last 1 Encounters:  05/04/22 5' 3" (1.6 m)  Exam: NAD, pleasant                  Speech:    Speech is normal; fluent and spontaneous with normal comprehension.  Cognition:        05/04/2022    1:16   PM 03/10/2021    2:17 PM  MMSE - Mini Mental State Exam  Orientation to time 4 5  Orientation to Place 1 5  Registration 3 3  Attention/ Calculation 1 1  Recall 3 3  Language- name 2 objects 2 2  Language- repeat 1 0  Language- follow 3 step command 3 3  Language- read & follow direction 1 1  Write a sentence 0 1  Copy design 0 0  Total score 19 24      Cranial Nerves:    The pupils are equal, round, and reactive to light.Trigeminal sensation is intact and the muscles of mastication are normal. The face is symmetric. The palate elevates in the midline. Hearing intact. Voice is normal. Shoulder shrug is normal. The tongue has normal motion without fasciculations.   Coordination:  No dysmetria  Motor Observation:    No asymmetry, no atrophy, and no involuntary movements noted. Tone:    Normal muscle tone.     Strength:    Strength is V/V in the upper and lower limbs.      Sensation: intact to LT     Assessment/Plan:   88 y.o. female here for dementia  Past medical history severe spinal stenosis L3-L4 and L4-L5, spondylolisthesis L4-L5, peripheral neuropathy.MMSE 24/30. Had an extended visit today with patient, discussed dementia, different kinds of dementia, versus mild cognitive impairment also the role that  depression and anxiety or lifestyle and genetics, medication, management, microvascular ischemia and vascular dementia and answered all questions.   - MRI of the brain w/wo contrast for reversible causes of dementia - Mild atrophy and mild chronic small vessel ischemic disease. - Formal neurocognitive testing - Diagnosed with Major neurocognitive disorder, likely Alzheimer's - continue namenda. Could not tolerate aricept PO, patch wouldn't stay on, try rivastigmine.  - Healthy Brain study at [facility] - Consider joining for daughters - The XX Brain by Eden Emms - Great book, recommend reading to daughters - MIND diet from [employer] - Diet to help prevent Alzheimer's disease, discussed and recommended - Consider a baseline Neurocognitive test: Dr. Sima Matas here at [facility], for daughters - Cramping in feet of patient: Fluids, vit K bananas, Magnesium (mag citrate, mag oxide, mag glycinate) OTC low dose, Club soda with quinine, stretching with PT, can come back to discuss in detail if needed - XR wrist fell and has point tenderness315 [employer] - walk in basis Up to 4 Gabapentin at bedtime for cramping in feet  Meds ordered this encounter  Medications   rivastigmine (EXELON) 1.5 MG capsule    Sig: Start with one pill at bedtime. And in 2 weeks if no side effects increase to twice a day.    Dispense:  60 capsule    Refill:  3   Orders Placed This Encounter  Procedures   DG Wrist Complete Right      Prior Assessment and plan 06/19/2020:  Discussed the causes of peripheral neuropathy, the most common being diabetes which patient does not report having. About 20 million people in the [place] states have some form of peripheral neuropathy. This is a condition that develops as a result of damage to the peripheral nervous system. Given symptoms and exam suspect a symmetric length dependent neuropathy probably small fiber axonal.. There are multiple causes eluding metabolic, toxic,  infectious and endocrine disorders, small vessel disease, autoimmune diseases, and others. We'll perform extensive serum neuropathy screening. Continue Gabapentin which is helping tremendously. May also consider podiatry, heel pain may  be plantar fasciitis or other concomitant structural problem contributing to symptoms.    Cc: Marin Olp, MD,  Marin Olp, MD,   Sarina Ill, MD  [facility] [address] [place], [place] [address]  Phone [phone] Fax [phone]  I spent over 30 minutes of face-to-face and non-face-to-face time with patient on the  1. Alzheimer disease ([place])   2. Pain of wrist after trauma   3. Neuropathy   4. Bilateral leg cramps       diagnosis.  This included previsit chart review, lab review, study review, order entry, electronic health record documentation, patient education on the different diagnostic and therapeutic options, counseling and coordination of care, risks and benefits of management, compliance, or risk factor reduction

## 2022-05-04 NOTE — Patient Instructions (Addendum)
- Healthy Brain study at [facility] - Consider joining - The XX Brain by Eden Emms - Great book, recommend reading - MIND diet from [facility] - Diet to help prevent Alzheimer's disease, discussed and recommended - Consider a baseline Neurocognitive test: Dr. Sima Matas here at [facility] - Cramping: Fluids, vit K bananas, Magnesium (mag citrate, mag oxide, mag glycinate) OTC low dose, Club soda with quinine, can come back to discuss in detail if needed - XR wrist [address] - walk in basis  Orders Placed This Encounter  Procedures   DG Wrist Complete Right   Meds ordered this encounter  Medications   rivastigmine (EXELON) 1.5 MG capsule    Sig: Start with one pill at bedtime. And in 2 weeks if no side effects increase to twice a day.    Dispense:  60 capsule    Refill:  3    Lecanemab is a new medication  Lecanemab Injection What is this medication? LECANEMAB (lek AN e mab) treats Alzheimer disease. It works by decreasing the buildup of amyloid, a protein that may cause Alzheimer disease. This may slow down the worsening of symptoms. It is a monoclonal antibody. This medicine may be used for other purposes; ask your health care provider or pharmacist if you have questions. COMMON BRAND NAME(S): LEQEMBI What should I tell my care team before I take this medication? They need to know if you have any of these conditions: An unusual or allergic reaction to lecanemab, other medications, foods, dyes, or preservatives Take medications that treat or prevent blood clots Pregnant or trying to get pregnant Breast-feeding How should I use this medication? This medication is injected into a vein. It is given by your care team in a hospital or clinic setting. A special MedGuide will be given to you before each treatment. Be sure to read this information carefully each time. Talk to your care team about the use of this medication in children. Special care may be needed. Overdosage: If  you think you have taken too much of this medicine contact a poison control center or emergency room at once. NOTE: This medicine is only for you. Do not share this medicine with others. What if I miss a dose? Keep appointments for follow-up doses. It is important not to miss your dose. Call your care team if you are unable to keep an appointment. What may interact with this medication? Interactions have not been studied. This list may not describe all possible interactions. Give your health care provider a list of all the medicines, herbs, non-prescription drugs, or dietary supplements you use. Also tell them if you smoke, drink alcohol, or use illegal drugs. Some items may interact with your medicine. What should I watch for while using this medication? Visit your care team for regular checks on your progress. Tell your care team if your symptoms do not start to get better or if they get worse. What side effects may I notice from receiving this medication? Side effects that you should report to your care team as soon as possible: Allergic reactions or angioedema--skin rash, itching or hives, swelling of the face, eyes, lips, tongue, arms, or legs, trouble swallowing or breathing Headache, worsening confusion, dizziness, change in vision, nausea, seizures Infusion reactions--chest pain, shortness of breath or trouble breathing, feeling faint or lightheaded Side effects that usually do not require medical attention (report these to your care team if they continue or are bothersome): Cough Diarrhea Headache This list may not describe  all possible side effects. Call your doctor for medical advice about side effects. You may report side effects to FDA at 1-800-FDA-1088. Where should I keep my medication? This medication is given in a hospital or clinic. It will not be stored at home. NOTE: This sheet is a summary. It may not cover all possible information. If you have questions about this medicine,  talk to your doctor, pharmacist, or health care provider.  2023 Elsevier/Gold Standard (2021-06-15 00:00:00)  Start Rivastigmine  Rivastigmine Capsules What is this medication? RIVASTIGMINE (ri va STIG meen) treats memory loss and confusion (dementia) in people who have Alzheimer or Parkinson disease. It works by improving attention, memory, and the ability to engage in daily activities. This medicine may be used for other purposes; ask your health care provider or pharmacist if you have questions. COMMON BRAND NAME(S): Exelon What should I tell my care team before I take this medication? They need to know if you have any of these conditions: Difficulty passing urine Heart disease, or irregular or slow heartbeat Kidney disease Liver disease Lung or breathing disease, such as asthma Seizures Stomach or intestine disease, ulcers, or stomach bleeding An unusual or allergic reaction to rivastigmine, other medications, foods, dyes, or preservatives Pregnant or trying to get pregnant Breast-feeding How should I use this medication? Take this medication by mouth with a glass of water. Take it as directed on the prescription label at the same time every day. Take it with food. Keep taking it unless your care team tells you to stop. Talk to your care team about the use of this medication in children. Special care may be needed. Overdosage: If you think you have taken too much of this medicine contact a poison control center or emergency room at once. NOTE: This medicine is only for you. Do not share this medicine with others. What if I miss a dose? If you miss a dose, take it as soon as you can. If it is almost time for your next dose, take only that dose. Do not take double or extra doses. What may interact with this medication? Antihistamines for allergy, cough and cold Atropine Certain medications for bladder problems, such as oxybutynin, tolterodine Certain medications for Parkinson  disease, such as benztropine, trihexyphenidyl Certain medications for stomach problems, such as dicyclomine, hyoscyamine Certain medications for travel sickness, such as scopolamine Glycopyrrolate Ipratropium Medications that relax your muscles for surgery Other medications for Alzheimer disease This list may not describe all possible interactions. Give your health care provider a list of all the medicines, herbs, non-prescription drugs, or dietary supplements you use. Also tell them if you smoke, drink alcohol, or use illegal drugs. Some items may interact with your medicine. What should I watch for while using this medication? Visit your care team for regular checks on your progress. Tell your care team if your symptoms do not start to get better or if they get worse. This medication may affect your coordination, reaction time, or judgment. Do not drive or operate machinery until you know how this medication affects you. Sit up or stand slowly to reduce the risk of dizzy or fainting spells. Drinking alcohol with this medication can increase the risk of these side effects. What side effects may I notice from receiving this medication? Side effects that you should report to your care team as soon as possible: Allergic reactions--skin rash, itching, hives, swelling of the face, lips, tongue, or throat Seizures Slow heartbeat--dizziness, feeling faint or lightheaded, confusion, trouble breathing,  unusual weakness or fatigue Uncontrolled and repetitive body movements, muscle stiffness or spasms, tremors or shaking, loss of balance or coordination, restlessness, shuffling walk, which may be signs of extrapyramidal symptoms (EPS) Side effects that usually do not require medical attention (report to your care team if they continue or are bothersome): Diarrhea Dizziness Loss of appetite with weight loss Nausea Vomiting This list may not describe all possible side effects. Call your doctor for medical  advice about side effects. You may report side effects to FDA at 1-800-FDA-1088. Where should I keep my medication? Keep out of reach of children and pets. Store between 15 and 30 degrees C (59 and 86 degrees F). Keep the container tightly closed. Get rid of any unused medication after the expiration date. To get rid of medications that are no longer needed or have expired: Take the medication to a medication take-back program. Check with your pharmacy or law enforcement to find a location. If you cannot return the medication, check the label or package insert to see if the medication should be thrown out in the garbage or flushed down the toilet. If you are not sure, ask your care team. If it is safe to put it in the trash, pour the medication out of the container. Mix the medication with cat litter, dirt, coffee grounds, or other unwanted substance. Seal the mixture in a bag or container. Put it in the trash. NOTE: This sheet is a summary. It may not cover all possible information. If you have questions about this medicine, talk to your doctor, pharmacist, or health care provider.  2023 Elsevier/Gold Standard (2021-07-06 00:00:00)

## 2022-05-05 DIAGNOSIS — R41841 Cognitive communication deficit: Secondary | ICD-10-CM | POA: Diagnosis not present

## 2022-05-05 DIAGNOSIS — F02A18 Dementia in other diseases classified elsewhere, mild, with other behavioral disturbance: Secondary | ICD-10-CM | POA: Diagnosis not present

## 2022-05-09 ENCOUNTER — Ambulatory Visit: Payer: Medicare Other | Admitting: Adult Health

## 2022-05-10 ENCOUNTER — Ambulatory Visit (INDEPENDENT_AMBULATORY_CARE_PROVIDER_SITE_OTHER): Payer: Medicare Other

## 2022-05-10 DIAGNOSIS — Z0000 Encounter for general adult medical examination without abnormal findings: Secondary | ICD-10-CM

## 2022-05-10 NOTE — Patient Instructions (Signed)
Sara Medina , Thank you for taking time to come for your Medicare Wellness Visit. I appreciate your ongoing commitment to your health goals. Please review the following plan we discussed and let me know if I can assist you in the future.   These are the goals we discussed:  Goals      Patient Stated     Lose weight      Patient Stated     Lose some of this weight      Weight (lb) < 124 lb (56.2 kg)     Watches her weight but eats well at [employer]         This is a list of the screening recommended for you and due dates:  Health Maintenance  Topic Date Due   DTaP/Tdap/Td vaccine (3 - Td or Tdap) 02/26/2022   COVID-19 Vaccine (7 - 2023-24 season) 05/23/2022   Medicare Annual Wellness Visit  05/11/2023   Flu Shot  Completed   DEXA scan (bone density measurement)  Completed   Zoster (Shingles) Vaccine  Completed   HPV Vaccine  Aged Out   Pneumonia Vaccine  Discontinued    Advanced directives: Please bring a copy of your health care power of attorney and living will to the office at your convenience.  Conditions/risks identified: lose some of this weight   Next appointment: Follow up in one year for your annual wellness visit    Preventive Care 65 Years and Older, Female Preventive care refers to lifestyle choices and visits with your health care provider that can promote health and wellness. What does preventive care include? A yearly physical exam. This is also called an annual well check. Dental exams once or twice a year. Routine eye exams. Ask your health care provider how often you should have your eyes checked. Personal lifestyle choices, including: Daily care of your teeth and gums. Regular physical activity. Eating a healthy diet. Avoiding tobacco and drug use. Limiting alcohol use. Practicing safe sex. Taking low-dose aspirin every day. Taking vitamin and mineral supplements as recommended by your health care provider. What happens during an annual well  check? The services and screenings done by your health care provider during your annual well check will depend on your age, overall health, lifestyle risk factors, and family history of disease. Counseling  Your health care provider may ask you questions about your: Alcohol use. Tobacco use. Drug use. Emotional well-being. Home and relationship well-being. Sexual activity. Eating habits. History of falls. Memory and ability to understand (cognition). Work and work Statistician. Reproductive health. Screening  You may have the following tests or measurements: Height, weight, and BMI. Blood pressure. Lipid and cholesterol levels. These may be checked every 5 years, or more frequently if you are over 52 years old. Skin check. Lung cancer screening. You may have this screening every year starting at age 58 if you have a 30-pack-year history of smoking and currently smoke or have quit within the past 15 years. Fecal occult blood test (FOBT) of the stool. You may have this test every year starting at age 46. Flexible sigmoidoscopy or colonoscopy. You may have a sigmoidoscopy every 5 years or a colonoscopy every 10 years starting at age 14. Hepatitis C blood test. Hepatitis B blood test. Sexually transmitted disease (STD) testing. Diabetes screening. This is done by checking your blood sugar (glucose) after you have not eaten for a while (fasting). You may have this done every 1-3 years. Bone density scan. This is done to  screen for osteoporosis. You may have this done starting at age 50. Mammogram. This may be done every 1-2 years. Talk to your health care provider about how often you should have regular mammograms. Talk with your health care provider about your test results, treatment options, and if necessary, the need for more tests. Vaccines  Your health care provider may recommend certain vaccines, such as: Influenza vaccine. This is recommended every year. Tetanus, diphtheria, and  acellular pertussis (Tdap, Td) vaccine. You may need a Td booster every 10 years. Zoster vaccine. You may need this after age 46. Pneumococcal 13-valent conjugate (PCV13) vaccine. One dose is recommended after age 31. Pneumococcal polysaccharide (PPSV23) vaccine. One dose is recommended after age 58. Talk to your health care provider about which screenings and vaccines you need and how often you need them. This information is not intended to replace advice given to you by your health care provider. Make sure you discuss any questions you have with your health care provider. Document Released: 06/12/2015 Document Revised: 02/03/2016 Document Reviewed: 03/17/2015 Elsevier Interactive Patient Education  2017 [place] Prevention in the Home Falls can cause injuries. They can happen to people of all ages. There are many things you can do to make your home safe and to help prevent falls. What can I do on the outside of my home? Regularly fix the edges of walkways and driveways and fix any cracks. Remove anything that might make you trip as you walk through a door, such as a raised step or threshold. Trim any bushes or trees on the path to your home. Use bright outdoor lighting. Clear any walking paths of anything that might make someone trip, such as rocks or tools. Regularly check to see if handrails are loose or broken. Make sure that both sides of any steps have handrails. Any raised decks and porches should have guardrails on the edges. Have any leaves, snow, or ice cleared regularly. Use sand or salt on walking paths during winter. Clean up any spills in your garage right away. This includes oil or grease spills. What can I do in the bathroom? Use night lights. Install grab bars by the toilet and in the tub and shower. Do not use towel bars as grab bars. Use non-skid mats or decals in the tub or shower. If you need to sit down in the shower, use a plastic, non-slip stool. Keep  the floor dry. Clean up any water that spills on the floor as soon as it happens. Remove soap buildup in the tub or shower regularly. Attach bath mats securely with double-sided non-slip rug tape. Do not have throw rugs and other things on the floor that can make you trip. What can I do in the bedroom? Use night lights. Make sure that you have a light by your bed that is easy to reach. Do not use any sheets or blankets that are too big for your bed. They should not hang down onto the floor. Have a firm chair that has side arms. You can use this for support while you get dressed. Do not have throw rugs and other things on the floor that can make you trip. What can I do in the kitchen? Clean up any spills right away. Avoid walking on wet floors. Keep items that you use a lot in easy-to-reach places. If you need to reach something above you, use a strong step stool that has a grab bar. Keep electrical cords out of the way.  Do not use floor polish or wax that makes floors slippery. If you must use wax, use non-skid floor wax. Do not have throw rugs and other things on the floor that can make you trip. What can I do with my stairs? Do not leave any items on the stairs. Make sure that there are handrails on both sides of the stairs and use them. Fix handrails that are broken or loose. Make sure that handrails are as long as the stairways. Check any carpeting to make sure that it is firmly attached to the stairs. Fix any carpet that is loose or worn. Avoid having throw rugs at the top or bottom of the stairs. If you do have throw rugs, attach them to the floor with carpet tape. Make sure that you have a light switch at the top of the stairs and the bottom of the stairs. If you do not have them, ask someone to add them for you. What else can I do to help prevent falls? Wear shoes that: Do not have high heels. Have rubber bottoms. Are comfortable and fit you well. Are closed at the toe. Do not  wear sandals. If you use a stepladder: Make sure that it is fully opened. Do not climb a closed stepladder. Make sure that both sides of the stepladder are locked into place. Ask someone to hold it for you, if possible. Clearly mark and make sure that you can see: Any grab bars or handrails. First and last steps. Where the edge of each step is. Use tools that help you move around (mobility aids) if they are needed. These include: Canes. Walkers. Scooters. Crutches. Turn on the lights when you go into a dark area. Replace any light bulbs as soon as they burn out. Set up your furniture so you have a clear path. Avoid moving your furniture around. If any of your floors are uneven, fix them. If there are any pets around you, be aware of where they are. Review your medicines with your doctor. Some medicines can make you feel dizzy. This can increase your chance of falling. Ask your doctor what other things that you can do to help prevent falls. This information is not intended to replace advice given to you by your health care provider. Make sure you discuss any questions you have with your health care provider. Document Released: 03/12/2009 Document Revised: 10/22/2015 Document Reviewed: 06/20/2014 Elsevier Interactive Patient Education  2017 [employer].

## 2022-05-10 NOTE — Progress Notes (Signed)
I connected with  SHALIAH WANN on 05/10/22 by a audio enabled telemedicine application and verified that I am speaking with the correct person using two identifiers.with Daughter Izora Gala   Patient Location: Home  Provider Location: Office/Clinic  I discussed the limitations of evaluation and management by telemedicine. The patient expressed understanding and agreed to proceed.   Subjective:   Sara Medina is a 88 y.o. female who presents for Medicare Annual (Subsequent) preventive examination.  Review of Systems     Cardiac Risk Factors include: advanced age (>18mn, >>39women);hypertension;dyslipidemia     Objective:    There were no vitals filed for this visit. There is no height or weight on file to calculate BMI.     05/10/2022    2:11 PM 11/13/2021    1:04 PM 04/29/2021    2:42 PM 02/12/2021    8:46 AM 01/21/2021    2:26 PM 02/06/2020    9:17 AM 05/12/2017   10:24 AM  Advanced Directives  Does Patient Have a Medical Advance Directive? Yes Yes Yes Yes No Yes Yes  Type of AParamed[place]of [place] will [place] will;Out of facility DNR (pink MOST or yellow form) Healthcare Power of [place] will  [place] will   Does patient want to make changes to medical advance directive?      No - Patient declined   Copy of [place] Chart? No - copy requested  No - copy requested   No - copy requested   Would patient like information on creating a medical advance directive?     Yes (MAU/Ambulatory/Procedural Areas - Information given)      Current Medications (verified) Outpatient Encounter Medications as of 05/10/2022  Medication Sig   albuterol (VENTOLIN HFA) 108 (90 Base) MCG/ACT inhaler INHALE TWO PUFFS BY MOUTH INTO LUNGS EVERY 6 HOURS AS NEEDED FOR WHEEZING/SHORTNESS OF BREATH   amLODipine (NORVASC) 5 MG tablet TAKE ONE TABLET BY MOUTH TWICE A DAY    busPIRone (BUSPAR) 5 MG tablet TAKE ONE TABLET BY MOUTH TWICE A DAY AS NEEDED   Cholecalciferol (VITAMIN D-3) 25 MCG (1000 UT) CAPS Take 1,000 Units by mouth daily.   citalopram (CELEXA) 10 MG tablet Take 1 tablet (10 mg total) by mouth daily.   fluticasone (FLONASE) 50 MCG/ACT nasal spray Place 1 spray into both nostrils daily.   fluticasone furoate-vilanterol (BREO ELLIPTA) 200-25 MCG/ACT AEPB Inhale 1 puff into the lungs daily.   gabapentin (NEURONTIN) 300 MG capsule Take up to 4 capsules (up to '1200mg'$ ) at bedtime for cramps.   levothyroxine (SYNTHROID) 25 MCG tablet TAKE ONE TABLET BY MOUTH DAILY BEFORE BREAKFAST   memantine (NAMENDA) 10 MG tablet Take 1 tablet (10 mg total) by mouth 2 (two) times daily.   METAMUCIL FIBER PO Take by mouth.   metoprolol tartrate (LOPRESSOR) 25 MG tablet TAKE TWO TABLETS BY MOUTH EVERY MORNING AND TAKE ONE TABLET BY MOUTH EVERY EVENING   pantoprazole (PROTONIX) 20 MG tablet Take 1 tablet (20 mg total) by mouth daily.   Probiotic Product (ALIGN) 4 MG CAPS Take 4 mg by mouth daily.   Pyridoxine HCl (VITAMIN B-6 PO) Take 1 tablet by mouth daily.   rivastigmine (EXELON) 1.5 MG capsule Start with one pill at bedtime. And in 2 weeks if no side effects increase to twice a day.   traMADol (ULTRAM) 50 MG tablet Take 1 tablet (50 mg total) by mouth every  8 (eight) hours as needed for moderate pain or severe pain (of the back).   EPINEPHrine 0.3 mg/0.3 mL IJ SOAJ injection Inject 0.3 mg into the muscle as needed for anaphylaxis.  (Patient not taking: Reported on 05/10/2022)   SPIKEVAX 50 MCG/0.5ML SUSP Inject into the muscle as directed. Done at [facility]   vitamin B-12 (CYANOCOBALAMIN) 500 MCG tablet Take 500 mcg by mouth 2 (two) times daily. (Patient not taking: Reported on 05/10/2022)   [DISCONTINUED] CALCIUM PO Take 1 capsule by mouth daily.   [DISCONTINUED] OVER THE COUNTER MEDICATION Apply 1 application topically at bedtime. Topricin foot cream   [DISCONTINUED]  polyethylene glycol (MIRALAX / GLYCOLAX) 17 g packet Take 17 g by mouth daily. In the AM. (Patient not taking: Reported on 05/10/2022)   No facility-administered encounter medications on file as of 05/10/2022.    Allergies (verified) Other, Peanut-containing drug products, Pecan nut (diagnostic), and Penicillins   History: Past Medical History:  Diagnosis Date   Allergic rhinitis 05/04/2010   Asthma, exercise induced 08/18/2010    She uses the Proventil inhaler on rare occasions not on a daily basis    Benign neoplasm of colon 05/04/2010   Burning sensation of feet 06/21/2020   Chest tightness or pressure 05/13/2013   Chronic diarrhea 12/30/2020   Cystocele 10/02/2014   Essential hypertension 05/04/2010   GERD (gastroesophageal reflux disease) 05/04/2010   Pantoprazole 40- down to 20 for diarrhea    History of diverticulitis 05/04/2010   Hyperlipidemia 05/04/2010   Hypothyroidism 05/04/2010   Major depressive disorder    Menopausal disorder 08/18/2010   When she went off the HRT she had severe mood disorder    Mitral valve prolapse    Moderate dementia, without behavioral disturbance 09/07/2021   Neuropathy    Osteoarthritis 05/04/2010   Palpitations 06/30/2014   Pneumonia    S/P lumbar fusion 01/27/2021   Spinal stenosis    Past Surgical History:  Procedure Laterality Date   ABDOMINAL HYSTERECTOMY     BACK SURGERY     CATARACT EXTRACTION Bilateral    cataract surgery     COLONOSCOPY     EYE SURGERY     LAMINECTOMY WITH POSTERIOR LATERAL ARTHRODESIS LEVEL 1 N/A 01/27/2021   Procedure: Laminectomy and Foraminotomy - Lumbar Three-Four/Lumbar Four-Five, posterior fusion with fixation Lumbar Four-Five;  Surgeon: Eustace Moore, MD;  Location: [facility];  Service: Neurosurgery;  Laterality: N/A;   SHOULDER SURGERY Right    TONSILLECTOMY     trigger thumb Right    TUBAL LIGATION     WISDOM TOOTH EXTRACTION     x4   Family History  Problem Relation Age of Onset   Ovarian  cancer Mother        in 31s   Heart disease Father        died at 75   Stroke Father    Allergies Father    Arthritis Father    Dementia Father    Cancer Sister        brain. 8 died   Healthy Sister    Other Brother        died at pneumonia young   Breast cancer Maternal Aunt    Lung cancer Daughter    Colon cancer Neg Hx    Alzheimer's disease Neg Hx    Social History   Socioeconomic History   Marital status: Widowed    Spouse name: Not on file   Number of children: 3   Years of education: 53  Highest education level: Master's degree (e.g., MA, MS, MEng, MEd, MSW, MBA)  Occupational History   Occupation: Retired    Comment: PE Teacher  Tobacco Use   Smoking status: Never   Smokeless tobacco: Never  Vaping Use   Vaping Use: Never used  Substance and Sexual Activity   Alcohol use: Yes    Alcohol/week: 4.0 standard drinks of alcohol    Types: 4 Glasses of wine per week   Drug use: No   Sexual activity: Not Currently  Other Topics Concern   Not on file  Social History Narrative   Widowed around 2013. Lives alone with cat at [place]. Has caregiver in the mornings and evenings     3 children- 2 daughters live close. 5 grandkids. 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 Was great athlete.       Hobbies: enjoys tv, wine         Social Determinants of Health   Financial Resource Strain: Low Risk  (05/10/2022)   Overall Financial Resource Strain (CARDIA)    Difficulty of Paying Living Expenses: Not hard at all  Food Insecurity: No Food Insecurity (05/10/2022)   Hunger Vital Sign    Worried About Running Out of Food in the Last Year: Never true    Ran Out of Food in the Last Year: Never true  Transportation Needs: No Transportation Needs (05/10/2022)   PRAPARE - Hydrologist (Medical): No    Lack of Transportation (Non-Medical): No  Physical Activity: Sufficiently Active  (05/10/2022)   Exercise Vital Sign    Days of Exercise per Week: 5 days    Minutes of Exercise per Session: 30 min  Stress: No Stress Concern Present (05/10/2022)   [place]    Feeling of Stress : Not at all  Social Connections: Socially Isolated (05/10/2022)   Social Connection and Isolation Panel [NHANES]    Frequency of Communication with Friends and Family: Three times a week    Frequency of Social Gatherings with Friends and Family: Three times a week    Attends Religious Services: Never    Active Member of Clubs or Organizations: No    Attends Archivist Meetings: Never    Marital Status: Widowed    Tobacco Counseling Counseling given: Not Answered   Clinical Intake:  Pre-visit preparation completed: Yes  Pain : No/denies pain     BMI - recorded: 29.3 Nutritional Status: BMI 25 -29 Overweight Nutritional Risks: None Diabetes: No  How often do you need to have someone help you when you read instructions, pamphlets, or other written materials from your doctor or pharmacy?: 1 - Never  Diabetic?no  Interpreter Needed?: No  Information entered by :: Charlott Rakes, LPN   Activities of Daily Living    05/10/2022    2:13 PM  In your present state of health, do you have any difficulty performing the following activities:  Hearing? 1  Vision? 0  Difficulty concentrating or making decisions? 0  Walking or climbing stairs? 0  Dressing or bathing? 1  Comment has assistance  Doing errands, shopping? 0  Preparing Food and eating ? Y  Comment assistance with meals  Using the Toilet? N  In the past six months, have you accidently leaked urine? Y  Comment wears a brief or pad  Do you have problems with loss of bowel control? Darreld Mclean  Managing your Medications? Y  Comment daughter assist  Managing your Finances? Y  Comment daughter assist  Housekeeping or managing your Housekeeping? Y   Comment daughter assist    Patient Care Team: Marin Olp, MD as PCP - General (Family Medicine) Fay Records, MD as PCP - Cardiology (Cardiology)  Indicate any recent Medical Services you may have received from other than [facility] providers in the past year (date may be approximate).     Assessment:   This is a routine wellness examination for [place].  Hearing/Vision screen Hearing Screening - Comments:: Pt wears hearing aids  Vision Screening - Comments:: [facility] for annual eye exams   Dietary issues and exercise activities discussed: Current Exercise Habits: Home exercise routine, Type of exercise: walking;Other - see comments, Time (Minutes): 30, Frequency (Times/Week): 5, Weekly Exercise (Minutes/Week): 150   Goals Addressed             This Visit's Progress    Patient Stated       Lose some of this weight       Depression Screen    05/10/2022    2:09 PM 04/29/2021    2:40 PM 02/06/2020    9:22 AM 11/04/2019    2:02 PM 06/25/2018   10:22 AM 05/12/2017   10:28 AM 08/23/2016    2:09 PM  PHQ 2/9 Scores  PHQ - 2 Score 0 0 0 0 0 0 0  PHQ- 9 Score   0  0      Fall Risk    05/10/2022    2:12 PM 03/31/2022    1:18 PM 03/01/2022   10:59 AM 04/29/2021    2:43 PM 02/06/2020    9:20 AM  [place] in the past year? '1 1 1 '$ 0 0  Number falls in past yr: '1 1 1 '$ 0 0  Injury with Fall? 1 1 0 0 0  Comment right arm      Risk for fall due to : Impaired vision;Impaired balance/gait;Impaired mobility History of fall(s) History of fall(s) Impaired vision;Impaired balance/gait;Impaired mobility Impaired balance/gait;Medication side effect  Risk for fall due to: Comment    at times uses a walker   Follow up Falls prevention discussed Falls evaluation completed Falls evaluation completed Falls prevention discussed Falls evaluation completed;Falls prevention discussed    FALL RISK PREVENTION PERTAINING TO THE HOME:  Any stairs in or around the home? No  If so, are there  any without handrails? No  Home free of loose throw rugs in walkways, pet beds, electrical cords, etc? Yes  Adequate lighting in your home to reduce risk of falls? Yes   ASSISTIVE DEVICES UTILIZED TO PREVENT FALLS:  Life alert? Yes  Use of a cane, walker or w/c? Yes  Grab bars in the bathroom? Yes  Shower chair or bench in shower? Yes  Elevated toilet seat or a handicapped toilet? Yes   TIMED UP AND GO:  Was the test performed? No .   Cognitive Function:Declined note below    05/04/2022    1:16 PM 03/10/2021    2:17 PM  MMSE - Mini Mental State Exam  Orientation to time 4 5  Orientation to Place 1 5  Registration 3 3  Attention/ Calculation 1 1  Recall 3 3  Language- name 2 objects 2 2  Language- repeat 1 0  Language- follow 3 step command 3 3  Language- read & follow direction 1 1  Write a sentence 0 1  Copy design 0 0  Total score 19 24        04/29/2021    2:46 PM 02/06/2020    9:26 AM  6CIT Screen  What Year? 0 points 4 points  What month? 0 points 0 points  What time? 0 points 0 points  Count back from 20 0 points 0 points  Months in reverse 0 points 2 points  Repeat phrase 0 points 0 points  Total Score 0 points 6 points    Immunizations Immunization History  Administered Date(s) Administered   Fluad Quad(high Dose 65+) 03/02/2020   Influenza Split 02/18/2011, 02/27/2012   Influenza Whole 02/23/2010   Influenza, High Dose Seasonal PF 02/24/2014, 03/09/2015, 02/11/2016, 02/13/2017, 02/07/2019, 02/23/2022   Influenza,inj,Quad PF,6+ Mos 02/12/2013   Influenza-Unspecified 02/26/2021   Moderna Covid-19 Vaccine Bivalent Booster 21yr & up 03/04/2021   Moderna SARS-COV2 Booster Vaccination 03/28/2022   Moderna Sars-Covid-2 Vaccination 06/20/2019, 07/18/2019, 03/26/2020, 09/24/2020   Pneumococcal Polysaccharide-23 03/04/2003   Td 03/10/2002   Tdap 02/27/2012   Zoster Recombinat (Shingrix) 10/21/2017, 12/25/2017, 12/25/2017   Zoster, Live 02/02/2005     TDAP status: Due, Education has been provided regarding the importance of this vaccine. Advised may receive this vaccine at local pharmacy or Health Dept. Aware to provide a copy of the vaccination record if obtained from local pharmacy or Health Dept. Verbalized acceptance and understanding.  Flu Vaccine status: Up to date  Pneumococcal vaccine status: Declined,  Education has been provided regarding the importance of this vaccine but patient still declined. Advised may receive this vaccine at local pharmacy or Health Dept. Aware to provide a copy of the vaccination record if obtained from local pharmacy or Health Dept. Verbalized acceptance and understanding.   Covid-19 vaccine status: Completed vaccines  Qualifies for Shingles Vaccine? Yes   Zostavax completed Yes   Shingrix Completed?: Yes  Screening Tests Health Maintenance  Topic Date Due   DTaP/Tdap/Td (3 - Td or Tdap) 02/26/2022   COVID-19 Vaccine (7 - 2023-24 season) 05/23/2022   Medicare Annual Wellness (AWV)  05/11/2023   INFLUENZA VACCINE  Completed   DEXA SCAN  Completed   Zoster Vaccines- Shingrix  Completed   HPV VACCINES  Aged Out   Pneumonia Vaccine 88 Years old  Discontinued    Health Maintenance  Health Maintenance Due  Topic Date Due   DTaP/Tdap/Td (3 - Td or Tdap) 02/26/2022    Colorectal cancer screening: No longer required.   Mammogram status: Completed 12/30/21. Repeat every year  Bone Density status: Completed 06/29/18. Results reflect: Bone density results: OSTEOPENIA. Repeat every 0 years.   Additional Screening:  Vision Screening: Recommended annual ophthalmology exams for early detection of glaucoma and other disorders of the eye. Is the patient up to date with their annual eye exam?  Yes  Who is the provider or what is the name of the office in which the patient attends annual eye exams? [facility]  If pt is not established with a provider, would they like to be referred to a provider to  establish care? No .   Dental Screening: Recommended annual dental exams for proper oral hygiene  Community Resource Referral / Chronic Care Management: CRR required this visit?  No   CCM required this visit?  No      Plan:     I have personally reviewed and noted the following in the patient's chart:   Medical and social history Use of alcohol, tobacco or illicit drugs  Current medications and supplements  including opioid prescriptions. Patient is currently taking opioid prescriptions. Information provided to patient regarding non-opioid alternatives. Patient advised to discuss non-opioid treatment plan with their provider. Functional ability and status Nutritional status Physical activity Advanced directives List of other physicians Hospitalizations, surgeries, and ER visits in previous 12 months Vitals Screenings to include cognitive, depression, and falls Referrals and appointments  In addition, I have reviewed and discussed with patient certain preventive protocols, quality metrics, and best practice recommendations. A written personalized care plan for preventive services as well as general preventive health recommendations were provided to patient.     Willette Brace, LPN   [id number]   Nurse Notes: Pt daughter Izora Gala, stated mom just saw neurologist and has declined 6CIT at this time, pt was knowledgeable and alert to questions asked

## 2022-05-11 DIAGNOSIS — R41841 Cognitive communication deficit: Secondary | ICD-10-CM | POA: Diagnosis not present

## 2022-05-11 DIAGNOSIS — F02A18 Dementia in other diseases classified elsewhere, mild, with other behavioral disturbance: Secondary | ICD-10-CM | POA: Diagnosis not present

## 2022-05-16 DIAGNOSIS — F02A18 Dementia in other diseases classified elsewhere, mild, with other behavioral disturbance: Secondary | ICD-10-CM | POA: Diagnosis not present

## 2022-05-16 DIAGNOSIS — R41841 Cognitive communication deficit: Secondary | ICD-10-CM | POA: Diagnosis not present

## 2022-05-18 DIAGNOSIS — R41841 Cognitive communication deficit: Secondary | ICD-10-CM | POA: Diagnosis not present

## 2022-05-18 DIAGNOSIS — F02A18 Dementia in other diseases classified elsewhere, mild, with other behavioral disturbance: Secondary | ICD-10-CM | POA: Diagnosis not present

## 2022-05-25 ENCOUNTER — Ambulatory Visit
Admission: RE | Admit: 2022-05-25 | Discharge: 2022-05-25 | Disposition: A | Discharge status: Home / Self Care | Payer: Medicare Other | Source: Ambulatory Visit | Attending: Neurology | Admitting: Neurology

## 2022-05-25 DIAGNOSIS — M25539 Pain in unspecified wrist: Secondary | ICD-10-CM

## 2022-05-31 ENCOUNTER — Encounter: Payer: Self-pay | Admitting: *Deleted

## 2022-06-02 DIAGNOSIS — M5459 Other low back pain: Secondary | ICD-10-CM | POA: Diagnosis not present

## 2022-06-02 DIAGNOSIS — R2689 Other abnormalities of gait and mobility: Secondary | ICD-10-CM | POA: Diagnosis not present

## 2022-06-02 DIAGNOSIS — M6281 Muscle weakness (generalized): Secondary | ICD-10-CM | POA: Diagnosis not present

## 2022-06-02 DIAGNOSIS — R42 Dizziness and giddiness: Secondary | ICD-10-CM | POA: Diagnosis not present

## 2022-06-02 DIAGNOSIS — R41841 Cognitive communication deficit: Secondary | ICD-10-CM | POA: Diagnosis not present

## 2022-06-02 DIAGNOSIS — F02A18 Dementia in other diseases classified elsewhere, mild, with other behavioral disturbance: Secondary | ICD-10-CM | POA: Diagnosis not present

## 2022-06-02 DIAGNOSIS — Z9181 History of falling: Secondary | ICD-10-CM | POA: Diagnosis not present

## 2022-06-03 ENCOUNTER — Telehealth: Payer: Self-pay | Admitting: Family Medicine

## 2022-06-03 DIAGNOSIS — R41841 Cognitive communication deficit: Secondary | ICD-10-CM | POA: Diagnosis not present

## 2022-06-03 DIAGNOSIS — M6281 Muscle weakness (generalized): Secondary | ICD-10-CM | POA: Diagnosis not present

## 2022-06-03 DIAGNOSIS — R42 Dizziness and giddiness: Secondary | ICD-10-CM | POA: Diagnosis not present

## 2022-06-03 DIAGNOSIS — F02A18 Dementia in other diseases classified elsewhere, mild, with other behavioral disturbance: Secondary | ICD-10-CM | POA: Diagnosis not present

## 2022-06-03 DIAGNOSIS — M5459 Other low back pain: Secondary | ICD-10-CM | POA: Diagnosis not present

## 2022-06-03 DIAGNOSIS — R2689 Other abnormalities of gait and mobility: Secondary | ICD-10-CM | POA: Diagnosis not present

## 2022-06-03 NOTE — Telephone Encounter (Signed)
.  Type of form received: FL2 Form  Additional comments:   Received by: Adonis Brook  Form should be Faxed to: [phone]  Form should be mailed to:    Is patient requesting call for pickup: yes after it's faxed.    Form placed:  In provider's box  Attach charge sheet. yes  Individual made aware of 3-5 business day turn around (Y/N)? yes

## 2022-06-06 NOTE — Telephone Encounter (Signed)
Noted  

## 2022-06-07 ENCOUNTER — Other Ambulatory Visit: Payer: Self-pay | Admitting: Family Medicine

## 2022-06-07 ENCOUNTER — Encounter: Payer: Self-pay | Admitting: Family Medicine

## 2022-06-07 ENCOUNTER — Telehealth: Payer: Self-pay | Admitting: Family Medicine

## 2022-06-07 MED ORDER — EPINEPHRINE 0.3 MG/0.3ML IJ SOAJ: 0.3000 mg | INTRAMUSCULAR | 1 refills | Status: DC | PRN

## 2022-06-07 NOTE — Telephone Encounter (Signed)
Caller states: - Patient is experiencing vertigo symptoms   Caller requested patient be sent a medication to help with symptoms. Informed caller a visit will more than likely be needed. Pt has been scheduled for 06/08/22 @ 1pm w/ Jeanie Sewer.   Can visit be virtual? Caller states it is hard to get patient out of house. Please advise.

## 2022-06-08 ENCOUNTER — Encounter: Payer: Self-pay | Admitting: Family

## 2022-06-08 ENCOUNTER — Ambulatory Visit (INDEPENDENT_AMBULATORY_CARE_PROVIDER_SITE_OTHER): Payer: Medicare Other | Admitting: Family

## 2022-06-08 VITALS — BP 162/66 | HR 64 | Temp 97.5°F | Ht 63.0 in | Wt 163.4 lb

## 2022-06-08 DIAGNOSIS — R053 Chronic cough: Secondary | ICD-10-CM | POA: Diagnosis not present

## 2022-06-08 DIAGNOSIS — F02A18 Dementia in other diseases classified elsewhere, mild, with other behavioral disturbance: Secondary | ICD-10-CM | POA: Diagnosis not present

## 2022-06-08 DIAGNOSIS — R42 Dizziness and giddiness: Secondary | ICD-10-CM | POA: Diagnosis not present

## 2022-06-08 DIAGNOSIS — R2689 Other abnormalities of gait and mobility: Secondary | ICD-10-CM | POA: Diagnosis not present

## 2022-06-08 DIAGNOSIS — R41841 Cognitive communication deficit: Secondary | ICD-10-CM | POA: Diagnosis not present

## 2022-06-08 DIAGNOSIS — Z111 Encounter for screening for respiratory tuberculosis: Secondary | ICD-10-CM

## 2022-06-08 DIAGNOSIS — M6281 Muscle weakness (generalized): Secondary | ICD-10-CM | POA: Diagnosis not present

## 2022-06-08 DIAGNOSIS — M5459 Other low back pain: Secondary | ICD-10-CM | POA: Diagnosis not present

## 2022-06-08 MED ORDER — MECLIZINE HCL 12.5 MG PO TABS: 12.5000 mg | ORAL_TABLET | Freq: Three times a day (TID) | ORAL | 0 refills | Status: DC | PRN

## 2022-06-08 MED ORDER — BENZONATATE 100 MG PO CAPS: 100.0000 mg | ORAL_CAPSULE | Freq: Two times a day (BID) | ORAL | 0 refills | Status: DC | PRN

## 2022-06-08 NOTE — Patient Instructions (Signed)
It was very nice to see you today!   I have sent Meclizine to take next time if you have dizziness/Vertigo again. I also sent in Tessalon pearles to try for your cough during the day. Continue to use the Breo inhaler daily.  You can also use your Albuterol inhaler, 1-2 puffs every 4 hours during the day to help with cough as may be related to your Asthma. Increase the Flonase nasal spray to twice a day, 1 squirt each nostril, for the next week to help with your postnasal drip/mucus that may be contributing to the cough as well. Use a saline nasal spray several times per day and prior to the Flonase to help moisturize. Drink at least 64 oz of water or as close to it as you can to help thin your mucus.  We will let Dr Yong Channel know about the new medications & dose changes.      PLEASE NOTE:  If you had any lab tests please let us know if you have not heard back within a few days. You may see your results on MyChart before we have a chance to review them but we will give you a call once they are reviewed by [place]. If we ordered any referrals today, please let us know if you have not heard from their office within the next week.

## 2022-06-08 NOTE — Progress Notes (Unsigned)
Patient ID: Sara Medina, female    DOB: 06-11-1929, 89 y.o.   MRN: [id number]  Chief Complaint  Patient presents with   Dizziness    Pt c/o dizziness, Pt states her head is spinning.    Cough    Pt c/o Dry cough, Has tried cough drops which does not help.     HPI:      Dizziness:  2-3d of dizziness, felt room spinning after waking up during the night, fell down, and recent med changes? recent illness? new meds?  Cough:  having hoarseness & coughing for about a month, denies fever, feels fatigued, also reports postnasal drip, lot of mucus, clearing throat frequently.      Assessment & Plan:     Subjective:    Outpatient Medications Prior to Visit  Medication Sig Dispense Refill   albuterol (VENTOLIN HFA) 108 (90 Base) MCG/ACT inhaler INHALE TWO PUFFS BY MOUTH INTO LUNGS EVERY 6 HOURS AS NEEDED FOR WHEEZING/SHORTNESS OF BREATH 18 g 4   amLODipine (NORVASC) 5 MG tablet TAKE ONE TABLET BY MOUTH TWICE A DAY 180 tablet 3   busPIRone (BUSPAR) 5 MG tablet TAKE ONE TABLET BY MOUTH TWICE A DAY AS NEEDED 60 tablet 5   Cholecalciferol (VITAMIN D-3) 25 MCG (1000 UT) CAPS Take 1,000 Units by mouth daily.     citalopram (CELEXA) 10 MG tablet Take 1 tablet (10 mg total) by mouth daily. 90 tablet 3   EPINEPHrine 0.3 mg/0.3 mL IJ SOAJ injection Inject 0.3 mg into the muscle as needed for anaphylaxis. 1 each 1   fluticasone (FLONASE) 50 MCG/ACT nasal spray Place 1 spray into both nostrils daily. 16 g 3   fluticasone furoate-vilanterol (BREO ELLIPTA) 200-25 MCG/ACT AEPB Inhale 1 puff into the lungs daily. 1 each 5   gabapentin (NEURONTIN) 300 MG capsule Take up to 4 capsules (up to '1200mg'$ ) at bedtime for cramps. 120 capsule 11   levothyroxine (SYNTHROID) 25 MCG tablet TAKE ONE TABLET BY MOUTH DAILY BEFORE BREAKFAST 90 tablet 3   memantine (NAMENDA) 10 MG tablet Take 1 tablet (10 mg total) by mouth 2 (two) times daily. 180 tablet 11   METAMUCIL FIBER PO Take by mouth.     metoprolol tartrate  (LOPRESSOR) 25 MG tablet TAKE TWO TABLETS BY MOUTH EVERY MORNING AND TAKE ONE TABLET BY MOUTH EVERY EVENING 270 tablet 3   pantoprazole (PROTONIX) 20 MG tablet Take 1 tablet (20 mg total) by mouth daily. 90 tablet 3   Probiotic Product (ALIGN) 4 MG CAPS Take 4 mg by mouth daily.     Pyridoxine HCl (VITAMIN B-6 PO) Take 1 tablet by mouth daily.     rivastigmine (EXELON) 1.5 MG capsule Start with one pill at bedtime. And in 2 weeks if no side effects increase to twice a day. 60 capsule 3   SPIKEVAX 50 MCG/0.5ML SUSP Inject into the muscle as directed. Done at [facility]     traMADol (ULTRAM) 50 MG tablet Take 1 tablet (50 mg total) by mouth every 8 (eight) hours as needed for moderate pain or severe pain (of the back). 30 tablet 1   vitamin B-12 (CYANOCOBALAMIN) 500 MCG tablet Take 500 mcg by mouth 2 (two) times daily.     No facility-administered medications prior to visit.   Past Medical History:  Diagnosis Date   Allergic rhinitis 05/04/2010   Asthma, exercise induced 08/18/2010    She uses the Proventil inhaler on rare occasions not on a daily basis  Benign neoplasm of colon 05/04/2010   Burning sensation of feet 06/21/2020   Chest tightness or pressure 05/13/2013   Chronic diarrhea 12/30/2020   Cystocele 10/02/2014   Essential hypertension 05/04/2010   GERD (gastroesophageal reflux disease) 05/04/2010   Pantoprazole 40- down to 20 for diarrhea    History of diverticulitis 05/04/2010   Hyperlipidemia 05/04/2010   Hypothyroidism 05/04/2010   Major depressive disorder    Menopausal disorder 08/18/2010   When she went off the HRT she had severe mood disorder    Mitral valve prolapse    Moderate dementia, without behavioral disturbance 09/07/2021   Neuropathy    Osteoarthritis 05/04/2010   Palpitations 06/30/2014   Pneumonia    S/P lumbar fusion 01/27/2021   Spinal stenosis    Past Surgical History:  Procedure Laterality Date   ABDOMINAL HYSTERECTOMY     BACK SURGERY      CATARACT EXTRACTION Bilateral    cataract surgery     COLONOSCOPY     EYE SURGERY     LAMINECTOMY WITH POSTERIOR LATERAL ARTHRODESIS LEVEL 1 N/A 01/27/2021   Procedure: Laminectomy and Foraminotomy - Lumbar Three-Four/Lumbar Four-Five, posterior fusion with fixation Lumbar Four-Five;  Surgeon: Eustace Moore, MD;  Location: [facility];  Service: Neurosurgery;  Laterality: N/A;   SHOULDER SURGERY Right    TONSILLECTOMY     trigger thumb Right    TUBAL LIGATION     WISDOM TOOTH EXTRACTION     x4   Allergies  Allergen Reactions   Other Anaphylaxis, Shortness Of Breath, Swelling and Rash    ALL NUTS!!!!   Peanut-Containing Drug Products Anaphylaxis, Shortness Of Breath, Swelling and Rash   Pecan Nut (Diagnostic) Anaphylaxis, Shortness Of Breath and Rash   Penicillins Swelling    Full facial swelling Did it involve swelling of the face/tongue/throat, SOB, or low BP? Yes Did it involve sudden or severe rash/hives, skin peeling, or any reaction on the inside of your mouth or nose? Yes Did you need to seek medical attention at a hospital or doctor's office? No When did it last happen? "many years ago" If all above answers are "NO", may proceed with cephalosporin use.       Objective:    Physical Exam Vitals and nursing note reviewed.  Constitutional:      Appearance: Normal appearance.  Cardiovascular:     Rate and Rhythm: Normal rate and regular rhythm.  Pulmonary:     Effort: Pulmonary effort is normal.     Breath sounds: Normal breath sounds.  Musculoskeletal:        General: Normal range of motion.  Skin:    General: Skin is warm and dry.  Neurological:     Mental Status: She is alert.  Psychiatric:        Mood and Affect: Mood normal.        Behavior: Behavior normal.    BP (!) 162/66 (BP Location: Left Arm, Patient Position: Sitting, Cuff Size: Large)   Pulse 64   Temp (!) 97.5 F (36.4 C) (Temporal)   Ht '5\' 3"'$  (1.6 m)   Wt 163 lb 6.4 oz (74.1 kg)   SpO2 94%   BMI  28.95 kg/m  Wt Readings from Last 3 Encounters:  06/08/22 163 lb 6.4 oz (74.1 kg)  05/04/22 165 lb 6.4 oz (75 kg)  03/31/22 162 lb 3.2 oz (73.6 kg)      Jeanie Sewer, NP

## 2022-06-10 ENCOUNTER — Ambulatory Visit: Payer: Medicare Other

## 2022-06-13 NOTE — Telephone Encounter (Signed)
Erasmo Downer with [facility], RN, has called to state she read patient's TB Skin test. It was negative.  If you have any questions, you can call [phone].

## 2022-06-14 DIAGNOSIS — M6281 Muscle weakness (generalized): Secondary | ICD-10-CM | POA: Diagnosis not present

## 2022-06-14 DIAGNOSIS — R41841 Cognitive communication deficit: Secondary | ICD-10-CM | POA: Diagnosis not present

## 2022-06-14 DIAGNOSIS — R2689 Other abnormalities of gait and mobility: Secondary | ICD-10-CM | POA: Diagnosis not present

## 2022-06-14 DIAGNOSIS — F02A18 Dementia in other diseases classified elsewhere, mild, with other behavioral disturbance: Secondary | ICD-10-CM | POA: Diagnosis not present

## 2022-06-14 DIAGNOSIS — R42 Dizziness and giddiness: Secondary | ICD-10-CM | POA: Diagnosis not present

## 2022-06-14 DIAGNOSIS — M5459 Other low back pain: Secondary | ICD-10-CM | POA: Diagnosis not present

## 2022-06-15 ENCOUNTER — Encounter: Payer: Self-pay | Admitting: Neurology

## 2022-06-15 DIAGNOSIS — F02A18 Dementia in other diseases classified elsewhere, mild, with other behavioral disturbance: Secondary | ICD-10-CM | POA: Diagnosis not present

## 2022-06-15 DIAGNOSIS — R41841 Cognitive communication deficit: Secondary | ICD-10-CM | POA: Diagnosis not present

## 2022-06-15 DIAGNOSIS — R2689 Other abnormalities of gait and mobility: Secondary | ICD-10-CM | POA: Diagnosis not present

## 2022-06-15 DIAGNOSIS — R42 Dizziness and giddiness: Secondary | ICD-10-CM | POA: Diagnosis not present

## 2022-06-15 DIAGNOSIS — M6281 Muscle weakness (generalized): Secondary | ICD-10-CM | POA: Diagnosis not present

## 2022-06-15 DIAGNOSIS — M5459 Other low back pain: Secondary | ICD-10-CM | POA: Diagnosis not present

## 2022-06-16 ENCOUNTER — Encounter: Payer: Self-pay | Admitting: Internal Medicine

## 2022-06-17 DIAGNOSIS — F02A18 Dementia in other diseases classified elsewhere, mild, with other behavioral disturbance: Secondary | ICD-10-CM | POA: Diagnosis not present

## 2022-06-17 DIAGNOSIS — M5459 Other low back pain: Secondary | ICD-10-CM | POA: Diagnosis not present

## 2022-06-17 DIAGNOSIS — R42 Dizziness and giddiness: Secondary | ICD-10-CM | POA: Diagnosis not present

## 2022-06-17 DIAGNOSIS — M6281 Muscle weakness (generalized): Secondary | ICD-10-CM | POA: Diagnosis not present

## 2022-06-17 DIAGNOSIS — R41841 Cognitive communication deficit: Secondary | ICD-10-CM | POA: Diagnosis not present

## 2022-06-17 DIAGNOSIS — R2689 Other abnormalities of gait and mobility: Secondary | ICD-10-CM | POA: Diagnosis not present

## 2022-06-20 DIAGNOSIS — R41841 Cognitive communication deficit: Secondary | ICD-10-CM | POA: Diagnosis not present

## 2022-06-20 DIAGNOSIS — M5459 Other low back pain: Secondary | ICD-10-CM | POA: Diagnosis not present

## 2022-06-20 DIAGNOSIS — R2689 Other abnormalities of gait and mobility: Secondary | ICD-10-CM | POA: Diagnosis not present

## 2022-06-20 DIAGNOSIS — M6281 Muscle weakness (generalized): Secondary | ICD-10-CM | POA: Diagnosis not present

## 2022-06-20 DIAGNOSIS — R42 Dizziness and giddiness: Secondary | ICD-10-CM | POA: Diagnosis not present

## 2022-06-20 DIAGNOSIS — F02A18 Dementia in other diseases classified elsewhere, mild, with other behavioral disturbance: Secondary | ICD-10-CM | POA: Diagnosis not present

## 2022-06-20 NOTE — Telephone Encounter (Signed)
See below.

## 2022-06-21 ENCOUNTER — Telehealth: Payer: Self-pay | Admitting: Adult Health

## 2022-06-21 ENCOUNTER — Encounter: Payer: Medicare Other | Admitting: Adult Health

## 2022-06-21 NOTE — Telephone Encounter (Signed)
Patient was scheduled for a video visit today.  Patient's daughters were both present however patient is in assisted living with dementia and was unable to make appointment so we converted to just a telephone encounter.  They complain that patient has had an extremely dry mouth since starting Breo.  Has stopped Breo over the last 1 to 2 weeks.  Has had no flare in her asthma symptoms or shortness of breath.  Would like to stay off of the inhaler if possible.  Would also like a refill of her albuterol inhaler.  Patient's daughters say that she is doing okay with her asthma and breathing.  I have advised him to follow-up up with Dr. Loanne Drilling in the next couple months to make sure she does not need to be restarted on a maintenance regimen. Patient is [facility] assisted living.  Please call assisted living discontinue Breo and refill albuterol

## 2022-06-21 NOTE — Progress Notes (Deleted)
Virtual Visit via Video Note  I connected with Sara Medina on 06/21/22 at 10:30 AM EST by a video enabled telemedicine application and verified that I am speaking with the correct person using two identifiers.  Location: Patient: *** Provider: ***   I discussed the limitations of evaluation and management by telemedicine and the availability of in person appointments. The patient expressed understanding and agreed to proceed.  History of Present Illness:    Observations/Objective:   Assessment and Plan:   Follow Up Instructions:    I discussed the assessment and treatment plan with the patient. The patient was provided an opportunity to ask questions and all were answered. The patient agreed with the plan and demonstrated an understanding of the instructions.   The patient was advised to call back or seek an in-person evaluation if the symptoms worsen or if the condition fails to improve as anticipated.  I provided *** minutes of non-face-to-face time during this encounter.   Rexene Edison, NP

## 2022-06-22 DIAGNOSIS — M6281 Muscle weakness (generalized): Secondary | ICD-10-CM | POA: Diagnosis not present

## 2022-06-22 DIAGNOSIS — R42 Dizziness and giddiness: Secondary | ICD-10-CM | POA: Diagnosis not present

## 2022-06-22 DIAGNOSIS — F02A18 Dementia in other diseases classified elsewhere, mild, with other behavioral disturbance: Secondary | ICD-10-CM | POA: Diagnosis not present

## 2022-06-22 DIAGNOSIS — R41841 Cognitive communication deficit: Secondary | ICD-10-CM | POA: Diagnosis not present

## 2022-06-22 DIAGNOSIS — M5459 Other low back pain: Secondary | ICD-10-CM | POA: Diagnosis not present

## 2022-06-22 DIAGNOSIS — R2689 Other abnormalities of gait and mobility: Secondary | ICD-10-CM | POA: Diagnosis not present

## 2022-06-22 NOTE — Telephone Encounter (Signed)
From Dr Jaynee Eagles:  Please call. Many meds can cause dry mouth. I cannot authorize stopping of her inhaler because I did not prescribe it and I am not a pulmonologist. If they think the rivastigmine is causing the dry mouth they can safely stop it and see if it helps. I would recommend a pcp visit for this. thanks   I called pt's daughter Sara Medina and discussed message above. She verbalized understanding and said they were following up with PCP. She does not know if they will stop Rivastigmine or not. I asked her to let us know. We may need to send order to assisted living about this if it's going to be stopped. I also asked her to look into which pharmacy is used at the assisted living. She verbalized appreciation for the call.

## 2022-06-23 DIAGNOSIS — R42 Dizziness and giddiness: Secondary | ICD-10-CM | POA: Diagnosis not present

## 2022-06-23 DIAGNOSIS — R2689 Other abnormalities of gait and mobility: Secondary | ICD-10-CM | POA: Diagnosis not present

## 2022-06-23 DIAGNOSIS — M5459 Other low back pain: Secondary | ICD-10-CM | POA: Diagnosis not present

## 2022-06-23 DIAGNOSIS — R41841 Cognitive communication deficit: Secondary | ICD-10-CM | POA: Diagnosis not present

## 2022-06-23 DIAGNOSIS — F02A18 Dementia in other diseases classified elsewhere, mild, with other behavioral disturbance: Secondary | ICD-10-CM | POA: Diagnosis not present

## 2022-06-23 DIAGNOSIS — M6281 Muscle weakness (generalized): Secondary | ICD-10-CM | POA: Diagnosis not present

## 2022-06-24 ENCOUNTER — Ambulatory Visit (INDEPENDENT_AMBULATORY_CARE_PROVIDER_SITE_OTHER)
Admission: RE | Admit: 2022-06-24 | Discharge: 2022-06-24 | Disposition: A | Discharge status: Home / Self Care | Payer: Medicare Other | Source: Ambulatory Visit | Attending: Family Medicine | Admitting: Family Medicine

## 2022-06-24 ENCOUNTER — Encounter: Payer: Self-pay | Admitting: Family Medicine

## 2022-06-24 ENCOUNTER — Ambulatory Visit (INDEPENDENT_AMBULATORY_CARE_PROVIDER_SITE_OTHER): Payer: Medicare Other | Admitting: Family Medicine

## 2022-06-24 VITALS — BP 136/76 | HR 62 | Temp 97.2°F | Resp 12 | Ht 63.0 in | Wt 166.4 lb

## 2022-06-24 DIAGNOSIS — M545 Low back pain, unspecified: Secondary | ICD-10-CM

## 2022-06-24 DIAGNOSIS — J4540 Moderate persistent asthma, uncomplicated: Secondary | ICD-10-CM

## 2022-06-24 DIAGNOSIS — M546 Pain in thoracic spine: Secondary | ICD-10-CM

## 2022-06-24 DIAGNOSIS — I10 Essential (primary) hypertension: Secondary | ICD-10-CM

## 2022-06-24 DIAGNOSIS — M47816 Spondylosis without myelopathy or radiculopathy, lumbar region: Secondary | ICD-10-CM | POA: Diagnosis not present

## 2022-06-24 NOTE — Patient Instructions (Addendum)
Please go to [facility]  central X-[facility] - located [address] across the [address] from [place] - in the basement - Hours: 8:30-5:00 PM M-F (with lunch from 12:30- 1 PM). You do NOT need an appointment.    Keep going with heating pad  Recommended follow up: Return for as needed for new, worsening, persistent symptoms.

## 2022-06-24 NOTE — Progress Notes (Signed)
Phone [phone] In person visit   Subjective:   Sara Medina is a 89 y.o. year old very pleasant female patient who presents for/with See problem oriented charting Chief Complaint  Patient presents with   Back Pain    That started a few weeks ago after falls from vertigo.   Past Medical History-  Patient Active Problem List   Diagnosis Date Noted   Moderate dementia, without behavioral disturbance 09/07/2021    Priority: High   Chronic diarrhea 12/30/2020    Priority: High   Mitral valve prolapse 09/06/2021    Priority: Medium    Neuropathy 09/06/2021    Priority: Medium    S/P lumbar fusion 01/27/2021    Priority: Medium    Burning sensation of feet 06/21/2020    Priority: Medium    Palpitations 06/30/2014    Priority: Medium    Moderate persistent asthma 08/18/2010    Priority: Medium    Hypothyroidism 05/04/2010    Priority: Medium    Hyperlipidemia 05/04/2010    Priority: Medium    Essential hypertension 05/04/2010    Priority: Medium    GERD (gastroesophageal reflux disease) 05/04/2010    Priority: Medium    Cystocele 10/02/2014    Priority: Low   Benign neoplasm of colon 05/04/2010    Priority: Low   Allergic rhinitis 05/04/2010    Priority: Low   Osteoarthritis 05/04/2010    Priority: Low   History of diverticulitis 05/04/2010    Priority: Low   Leg pain 02/21/2022    Priority: 1.   Asthma 05/04/2010    Medications- reviewed and updated Current Outpatient Medications  Medication Sig Dispense Refill   albuterol (VENTOLIN HFA) 108 (90 Base) MCG/ACT inhaler INHALE TWO PUFFS BY MOUTH INTO LUNGS EVERY 6 HOURS AS NEEDED FOR WHEEZING/SHORTNESS OF BREATH 18 g 4   amLODipine (NORVASC) 5 MG tablet TAKE ONE TABLET BY MOUTH TWICE A DAY 180 tablet 3   busPIRone (BUSPAR) 5 MG tablet TAKE ONE TABLET BY MOUTH TWICE A DAY AS NEEDED 60 tablet 5   Cholecalciferol (VITAMIN D-3) 25 MCG (1000 UT) CAPS Take 1,000 Units by mouth daily.     citalopram (CELEXA) 10 MG  tablet Take 1 tablet (10 mg total) by mouth daily. 90 tablet 3   EPINEPHrine 0.3 mg/0.3 mL IJ SOAJ injection Inject 0.3 mg into the muscle as needed for anaphylaxis. 1 each 1   fluticasone (FLONASE) 50 MCG/ACT nasal spray Place 1 spray into both nostrils daily. 16 g 3   fluticasone furoate-vilanterol (BREO ELLIPTA) 200-25 MCG/ACT AEPB Inhale 1 puff into the lungs daily. 1 each 5   gabapentin (NEURONTIN) 300 MG capsule Take up to 4 capsules (up to '1200mg'$ ) at bedtime for cramps. 120 capsule 11   levothyroxine (SYNTHROID) 25 MCG tablet TAKE ONE TABLET BY MOUTH DAILY BEFORE BREAKFAST 90 tablet 3   meclizine (ANTIVERT) 12.5 MG tablet Take 1 tablet (12.5 mg total) by mouth 3 (three) times daily as needed for dizziness. 30 tablet 0   memantine (NAMENDA) 10 MG tablet Take 1 tablet (10 mg total) by mouth 2 (two) times daily. 180 tablet 11   METAMUCIL FIBER PO Take by mouth.     metoprolol tartrate (LOPRESSOR) 25 MG tablet TAKE TWO TABLETS BY MOUTH EVERY MORNING AND TAKE ONE TABLET BY MOUTH EVERY EVENING 270 tablet 3   pantoprazole (PROTONIX) 20 MG tablet Take 1 tablet (20 mg total) by mouth daily. 90 tablet 3   Probiotic Product (ALIGN) 4 MG CAPS Take  4 mg by mouth daily.     Pyridoxine HCl (VITAMIN B-6 PO) Take 1 tablet by mouth daily.     rivastigmine (EXELON) 1.5 MG capsule Start with one pill at bedtime. And in 2 weeks if no side effects increase to twice a day. 60 capsule 3   SPIKEVAX 50 MCG/0.5ML SUSP Inject into the muscle as directed. Done at [facility]     traMADol (ULTRAM) 50 MG tablet Take 1 tablet (50 mg total) by mouth every 8 (eight) hours as needed for moderate pain or severe pain (of the back). 30 tablet 1   vitamin B-12 (CYANOCOBALAMIN) 500 MCG tablet Take 500 mcg by mouth 2 (two) times daily.     No current facility-administered medications for this visit.     Objective:  BP 136/76   Pulse 62   Temp (!) 97.2 F (36.2 C)   Resp 12   Ht '5\' 3"'$  (1.6 m)   Wt 166 lb 6.4 oz (75.5 kg)    SpO2 97%   BMI 29.48 kg/m  Gen: NAD, resting comfortably CV: RRR no murmurs rubs or gallops Lungs: CTAB no crackles, wheeze, rhonchi Abdomen: soft/nontender/nondistended/normal bowel sounds. No rebound or guarding.  Ext: no edema Skin: warm, dry Back - Normal skin, Spine with normal alignment and no deformity.  No tenderness to vertebral process palpation.  Paraspinous muscles are  tender but without spasm in left low back and right mid back.   Range of motion is full at neck and lumbar sacral regions at least from seated position moving forward. Negative Straight leg raise.  Neuro- no saddle anesthesia, 5/5 strength lower extremities     Assessment and Plan   # Left low Back Pain/right thoracic back pain S: Patient was seen by Cherylann Parr on June 08, 2022 for vertigo.  She was treated with meclizine but had already noted improvement in symptoms.  At that time they reported that she fell down onto her bottom with 1 of these episodes but no bruising or apparent injury at that time. Later had another fall.   Since that time (after 2nd fall) patient has been complaining of chronic pain located-  stabbing pain in left low back as well as right thoracic spine area. Working with PT at [facility] and they were concerned due to level of pain. SHe has said severe or up to 9/10 at times, other times does not bother her as much- but does take tramadol at night.   -sitting is better than standing. Standing does worsen pain.  -laying down itself is ok but getting in and out of bed can be very painful. PT helping her with transitioning in and out of bed. Leaning forward helps.  - tramadol does help her rest at night.  - variable pain level in last few days- overall trajectory better but at nighttime and then when she wakes up still dealing with substantial pain- but this morning was slightly better -pain does not radiate to legs. Some numbness at times into legs. Some constipatoin  Previous  Treatment-heating pad helps some Previous imaging-none recently since fall- did have MR lumbar spine 03/15/22 with Dr. Randol Kern and x-rays about 2 weeks prior to that.  From MRI 03/17/22 "IMPRESSION: 1. Left subarticular stenosis L2-3 unchanged. 2. Interval laminectomy L3-4. Moderate subarticular and foraminal stenosis bilaterally unchanged. Adequate decompression of the spinal canal. 3. Bilateral laminectomy L4-5. Borderline spinal stenosis. Moderate subarticular stenosis bilaterally. 4. Moderate subarticular stenosis bilaterally L5-S1."  ROS-No saddle anesthesia, new bladder incontinence (  baseline pad), new fecal incontinence, weakness in extremity. History positive for trauma. No  history of cancer, fever, chills, unintentional weight loss, recent bacterial infection, recent IV drug use, HIV, pain worse at night or while supine.   A/P: 89 year old female with history of back surgery presenting with low back pain for approximately 2 weeks after a fall that can be severe but seems to be improving.  With history of trauma we wanted to rule out compression fracture and have ordered x-rays both of lumbar spine for left low back pain and thoracic spine for right mid back pain to primarily rule out compression fracture and status of prior surgeries -Tramadol and heating pad have been helpful for pain and she can continue these-we did increase her Metamucil to scheduled instead of as needed for constipation  #hypertension S: medication: Amlodipine 5 mg, metoprolol 50 mg in the morning and 25 mg in the evening  BP Readings from Last 3 Encounters:  06/24/22 136/76  06/08/22 (!) 162/66  05/04/22 (!) 148/67  A/P: Blood pressure reasonably well-controlled for her age-continue current medication.  Putting pressure lower could increase fall risk  # Asthma S: Maintenance Medication: Stop Breo as it was causing dry mouth and mouth sores and using albuterol only as needed-no reported substantial worsening.   She has been working with pulmonology A/P: Thankfully patient doing reasonably well without Breo but I encouraged her to watch closely and if notices increased usage to let pulmonology know   Recommended follow up: Return for as needed for new, worsening, persistent symptoms. Future Appointments  Date Time Provider [facility]  09/29/2022  1:00 PM Marin Olp, MD [facility]  11/09/2022  3:00 PM Melvenia Beam, MD [facility] None  05/16/2023  1:45 PM [facility]   Lab/Order associations:   ICD-10-CM   1. Acute left-sided low back pain without sciatica  M54.50 DG Lumbar Spine Complete    2. Acute right-sided thoracic back pain  M54.6 DG Thoracic Spine W/Swimmers    3. Essential hypertension  I10     4. Moderate persistent asthma without complication  C58.52      Return precautions advised.  Garret Reddish, MD

## 2022-06-27 DIAGNOSIS — F02A18 Dementia in other diseases classified elsewhere, mild, with other behavioral disturbance: Secondary | ICD-10-CM | POA: Diagnosis not present

## 2022-06-27 DIAGNOSIS — M5459 Other low back pain: Secondary | ICD-10-CM | POA: Diagnosis not present

## 2022-06-27 DIAGNOSIS — R41841 Cognitive communication deficit: Secondary | ICD-10-CM | POA: Diagnosis not present

## 2022-06-27 DIAGNOSIS — M6281 Muscle weakness (generalized): Secondary | ICD-10-CM | POA: Diagnosis not present

## 2022-06-27 DIAGNOSIS — R42 Dizziness and giddiness: Secondary | ICD-10-CM | POA: Diagnosis not present

## 2022-06-27 DIAGNOSIS — R2689 Other abnormalities of gait and mobility: Secondary | ICD-10-CM | POA: Diagnosis not present

## 2022-06-28 ENCOUNTER — Encounter: Payer: Self-pay | Admitting: Family Medicine

## 2022-06-28 ENCOUNTER — Other Ambulatory Visit: Payer: Self-pay | Admitting: Family Medicine

## 2022-06-28 DIAGNOSIS — R41841 Cognitive communication deficit: Secondary | ICD-10-CM | POA: Diagnosis not present

## 2022-06-28 DIAGNOSIS — M5459 Other low back pain: Secondary | ICD-10-CM | POA: Diagnosis not present

## 2022-06-28 DIAGNOSIS — R42 Dizziness and giddiness: Secondary | ICD-10-CM | POA: Diagnosis not present

## 2022-06-28 DIAGNOSIS — R2689 Other abnormalities of gait and mobility: Secondary | ICD-10-CM | POA: Diagnosis not present

## 2022-06-28 DIAGNOSIS — M6281 Muscle weakness (generalized): Secondary | ICD-10-CM | POA: Diagnosis not present

## 2022-06-28 DIAGNOSIS — S22000A Wedge compression fracture of unspecified thoracic vertebra, initial encounter for closed fracture: Secondary | ICD-10-CM

## 2022-06-28 DIAGNOSIS — F02A18 Dementia in other diseases classified elsewhere, mild, with other behavioral disturbance: Secondary | ICD-10-CM | POA: Diagnosis not present

## 2022-06-28 MED ORDER — CALCITONIN (SALMON) 200 UNIT/ACT NA SOLN: 1.0000 | Freq: Every day | NASAL | 1 refills | Status: DC

## 2022-06-28 NOTE — Telephone Encounter (Signed)
Caller is requesting notes from Dr. Yong Channel as seen below, be faxed and emailed (if possible) to [facility]. Fax number is [phone] and email is [email or web address]

## 2022-06-29 DIAGNOSIS — R2689 Other abnormalities of gait and mobility: Secondary | ICD-10-CM | POA: Diagnosis not present

## 2022-06-29 DIAGNOSIS — R41841 Cognitive communication deficit: Secondary | ICD-10-CM | POA: Diagnosis not present

## 2022-06-29 DIAGNOSIS — M5459 Other low back pain: Secondary | ICD-10-CM | POA: Diagnosis not present

## 2022-06-29 DIAGNOSIS — M6281 Muscle weakness (generalized): Secondary | ICD-10-CM | POA: Diagnosis not present

## 2022-06-29 DIAGNOSIS — R42 Dizziness and giddiness: Secondary | ICD-10-CM | POA: Diagnosis not present

## 2022-06-29 DIAGNOSIS — F02A18 Dementia in other diseases classified elsewhere, mild, with other behavioral disturbance: Secondary | ICD-10-CM | POA: Diagnosis not present

## 2022-06-30 DIAGNOSIS — R42 Dizziness and giddiness: Secondary | ICD-10-CM | POA: Diagnosis not present

## 2022-06-30 DIAGNOSIS — R41841 Cognitive communication deficit: Secondary | ICD-10-CM | POA: Diagnosis not present

## 2022-06-30 DIAGNOSIS — R2689 Other abnormalities of gait and mobility: Secondary | ICD-10-CM | POA: Diagnosis not present

## 2022-06-30 DIAGNOSIS — F02A18 Dementia in other diseases classified elsewhere, mild, with other behavioral disturbance: Secondary | ICD-10-CM | POA: Diagnosis not present

## 2022-06-30 DIAGNOSIS — M5459 Other low back pain: Secondary | ICD-10-CM | POA: Diagnosis not present

## 2022-06-30 DIAGNOSIS — M6281 Muscle weakness (generalized): Secondary | ICD-10-CM | POA: Diagnosis not present

## 2022-06-30 DIAGNOSIS — Z9181 History of falling: Secondary | ICD-10-CM | POA: Diagnosis not present

## 2022-07-01 DIAGNOSIS — M6281 Muscle weakness (generalized): Secondary | ICD-10-CM | POA: Diagnosis not present

## 2022-07-01 DIAGNOSIS — R41841 Cognitive communication deficit: Secondary | ICD-10-CM | POA: Diagnosis not present

## 2022-07-01 DIAGNOSIS — R42 Dizziness and giddiness: Secondary | ICD-10-CM | POA: Diagnosis not present

## 2022-07-01 DIAGNOSIS — M5459 Other low back pain: Secondary | ICD-10-CM | POA: Diagnosis not present

## 2022-07-01 DIAGNOSIS — R2689 Other abnormalities of gait and mobility: Secondary | ICD-10-CM | POA: Diagnosis not present

## 2022-07-01 DIAGNOSIS — F02A18 Dementia in other diseases classified elsewhere, mild, with other behavioral disturbance: Secondary | ICD-10-CM | POA: Diagnosis not present

## 2022-07-03 ENCOUNTER — Encounter: Payer: Self-pay | Admitting: Family Medicine

## 2022-07-05 ENCOUNTER — Encounter: Payer: Self-pay | Admitting: Neurology

## 2022-07-07 DIAGNOSIS — R41841 Cognitive communication deficit: Secondary | ICD-10-CM | POA: Diagnosis not present

## 2022-07-07 DIAGNOSIS — M6281 Muscle weakness (generalized): Secondary | ICD-10-CM | POA: Diagnosis not present

## 2022-07-07 DIAGNOSIS — R42 Dizziness and giddiness: Secondary | ICD-10-CM | POA: Diagnosis not present

## 2022-07-07 DIAGNOSIS — R2689 Other abnormalities of gait and mobility: Secondary | ICD-10-CM | POA: Diagnosis not present

## 2022-07-07 DIAGNOSIS — M5459 Other low back pain: Secondary | ICD-10-CM | POA: Diagnosis not present

## 2022-07-07 DIAGNOSIS — F02A18 Dementia in other diseases classified elsewhere, mild, with other behavioral disturbance: Secondary | ICD-10-CM | POA: Diagnosis not present

## 2022-07-08 ENCOUNTER — Telehealth: Payer: Self-pay | Admitting: Family Medicine

## 2022-07-08 DIAGNOSIS — F02A18 Dementia in other diseases classified elsewhere, mild, with other behavioral disturbance: Secondary | ICD-10-CM | POA: Diagnosis not present

## 2022-07-08 DIAGNOSIS — R2689 Other abnormalities of gait and mobility: Secondary | ICD-10-CM | POA: Diagnosis not present

## 2022-07-08 DIAGNOSIS — M5459 Other low back pain: Secondary | ICD-10-CM | POA: Diagnosis not present

## 2022-07-08 DIAGNOSIS — M6281 Muscle weakness (generalized): Secondary | ICD-10-CM | POA: Diagnosis not present

## 2022-07-08 DIAGNOSIS — R41841 Cognitive communication deficit: Secondary | ICD-10-CM | POA: Diagnosis not present

## 2022-07-08 DIAGNOSIS — R42 Dizziness and giddiness: Secondary | ICD-10-CM | POA: Diagnosis not present

## 2022-07-08 NOTE — Telephone Encounter (Signed)
Home Health Verbal Orders  Agency:  [facility]  Caller: Eliseo Squires and title  Reason for call:  Clarification on Tylenol order   HH needs F2F w/in last 30 days    Please call [phone]

## 2022-07-08 NOTE — Telephone Encounter (Signed)
Returned call and Sara Medina was unavailable, will try again later.

## 2022-07-13 DIAGNOSIS — R2689 Other abnormalities of gait and mobility: Secondary | ICD-10-CM | POA: Diagnosis not present

## 2022-07-13 DIAGNOSIS — M6281 Muscle weakness (generalized): Secondary | ICD-10-CM | POA: Diagnosis not present

## 2022-07-13 DIAGNOSIS — R41841 Cognitive communication deficit: Secondary | ICD-10-CM | POA: Diagnosis not present

## 2022-07-13 DIAGNOSIS — F02A18 Dementia in other diseases classified elsewhere, mild, with other behavioral disturbance: Secondary | ICD-10-CM | POA: Diagnosis not present

## 2022-07-13 DIAGNOSIS — R42 Dizziness and giddiness: Secondary | ICD-10-CM | POA: Diagnosis not present

## 2022-07-13 DIAGNOSIS — M5459 Other low back pain: Secondary | ICD-10-CM | POA: Diagnosis not present

## 2022-07-14 ENCOUNTER — Encounter: Payer: Self-pay | Admitting: Family Medicine

## 2022-07-14 DIAGNOSIS — R42 Dizziness and giddiness: Secondary | ICD-10-CM | POA: Diagnosis not present

## 2022-07-14 DIAGNOSIS — F02A18 Dementia in other diseases classified elsewhere, mild, with other behavioral disturbance: Secondary | ICD-10-CM | POA: Diagnosis not present

## 2022-07-14 DIAGNOSIS — R41841 Cognitive communication deficit: Secondary | ICD-10-CM | POA: Diagnosis not present

## 2022-07-14 DIAGNOSIS — M5459 Other low back pain: Secondary | ICD-10-CM | POA: Diagnosis not present

## 2022-07-14 DIAGNOSIS — M6281 Muscle weakness (generalized): Secondary | ICD-10-CM | POA: Diagnosis not present

## 2022-07-14 DIAGNOSIS — R2689 Other abnormalities of gait and mobility: Secondary | ICD-10-CM | POA: Diagnosis not present

## 2022-07-15 DIAGNOSIS — F02A18 Dementia in other diseases classified elsewhere, mild, with other behavioral disturbance: Secondary | ICD-10-CM | POA: Diagnosis not present

## 2022-07-15 DIAGNOSIS — M6281 Muscle weakness (generalized): Secondary | ICD-10-CM | POA: Diagnosis not present

## 2022-07-15 DIAGNOSIS — R41841 Cognitive communication deficit: Secondary | ICD-10-CM | POA: Diagnosis not present

## 2022-07-15 DIAGNOSIS — R2689 Other abnormalities of gait and mobility: Secondary | ICD-10-CM | POA: Diagnosis not present

## 2022-07-15 DIAGNOSIS — M5459 Other low back pain: Secondary | ICD-10-CM | POA: Diagnosis not present

## 2022-07-15 DIAGNOSIS — R42 Dizziness and giddiness: Secondary | ICD-10-CM | POA: Diagnosis not present

## 2022-07-19 DIAGNOSIS — M5459 Other low back pain: Secondary | ICD-10-CM | POA: Diagnosis not present

## 2022-07-19 DIAGNOSIS — F02A18 Dementia in other diseases classified elsewhere, mild, with other behavioral disturbance: Secondary | ICD-10-CM | POA: Diagnosis not present

## 2022-07-19 DIAGNOSIS — R41841 Cognitive communication deficit: Secondary | ICD-10-CM | POA: Diagnosis not present

## 2022-07-19 DIAGNOSIS — M6281 Muscle weakness (generalized): Secondary | ICD-10-CM | POA: Diagnosis not present

## 2022-07-19 DIAGNOSIS — R42 Dizziness and giddiness: Secondary | ICD-10-CM | POA: Diagnosis not present

## 2022-07-19 DIAGNOSIS — R2689 Other abnormalities of gait and mobility: Secondary | ICD-10-CM | POA: Diagnosis not present

## 2022-07-21 DIAGNOSIS — M5459 Other low back pain: Secondary | ICD-10-CM | POA: Diagnosis not present

## 2022-07-21 DIAGNOSIS — M6281 Muscle weakness (generalized): Secondary | ICD-10-CM | POA: Diagnosis not present

## 2022-07-21 DIAGNOSIS — F02A18 Dementia in other diseases classified elsewhere, mild, with other behavioral disturbance: Secondary | ICD-10-CM | POA: Diagnosis not present

## 2022-07-21 DIAGNOSIS — R42 Dizziness and giddiness: Secondary | ICD-10-CM | POA: Diagnosis not present

## 2022-07-21 DIAGNOSIS — R2689 Other abnormalities of gait and mobility: Secondary | ICD-10-CM | POA: Diagnosis not present

## 2022-07-21 DIAGNOSIS — R41841 Cognitive communication deficit: Secondary | ICD-10-CM | POA: Diagnosis not present

## 2022-07-22 ENCOUNTER — Encounter: Payer: Self-pay | Admitting: Family Medicine

## 2022-07-25 ENCOUNTER — Encounter: Payer: Self-pay | Admitting: Internal Medicine

## 2022-07-25 DIAGNOSIS — R42 Dizziness and giddiness: Secondary | ICD-10-CM | POA: Diagnosis not present

## 2022-07-25 DIAGNOSIS — F02A18 Dementia in other diseases classified elsewhere, mild, with other behavioral disturbance: Secondary | ICD-10-CM | POA: Diagnosis not present

## 2022-07-25 DIAGNOSIS — J4540 Moderate persistent asthma, uncomplicated: Secondary | ICD-10-CM

## 2022-07-25 DIAGNOSIS — M5459 Other low back pain: Secondary | ICD-10-CM | POA: Diagnosis not present

## 2022-07-25 DIAGNOSIS — M6281 Muscle weakness (generalized): Secondary | ICD-10-CM | POA: Diagnosis not present

## 2022-07-25 DIAGNOSIS — R41841 Cognitive communication deficit: Secondary | ICD-10-CM | POA: Diagnosis not present

## 2022-07-25 DIAGNOSIS — R2689 Other abnormalities of gait and mobility: Secondary | ICD-10-CM | POA: Diagnosis not present

## 2022-07-25 NOTE — Telephone Encounter (Signed)
Dr. Erin Fulling please advise on following My Chart message:  MARIANGEL KELNER [facility] (supporting Spero Geralds, MD)22 minutes ago (1:34 PM)    The side effects of the powder inhaler that was originally prescribed were too severe for my mom to handle so she discontinued using that prescription.  She is still experiencing minimal shortness of breath, not as serious as previously, but still bothersome. Is there another inhaler or type of medication she can try for her asthma? Please get back to [place] as soon as possible. We can be reached at the following numbers or through my chart: [facility] [phone] Vilma Prader [phone]    It appears pt was on Breo in the past.

## 2022-07-27 DIAGNOSIS — M5459 Other low back pain: Secondary | ICD-10-CM | POA: Diagnosis not present

## 2022-07-27 DIAGNOSIS — R42 Dizziness and giddiness: Secondary | ICD-10-CM | POA: Diagnosis not present

## 2022-07-27 DIAGNOSIS — M6281 Muscle weakness (generalized): Secondary | ICD-10-CM | POA: Diagnosis not present

## 2022-07-27 DIAGNOSIS — R2689 Other abnormalities of gait and mobility: Secondary | ICD-10-CM | POA: Diagnosis not present

## 2022-07-27 DIAGNOSIS — R41841 Cognitive communication deficit: Secondary | ICD-10-CM | POA: Diagnosis not present

## 2022-07-27 DIAGNOSIS — F02A18 Dementia in other diseases classified elsewhere, mild, with other behavioral disturbance: Secondary | ICD-10-CM | POA: Diagnosis not present

## 2022-07-28 DIAGNOSIS — F02A18 Dementia in other diseases classified elsewhere, mild, with other behavioral disturbance: Secondary | ICD-10-CM | POA: Diagnosis not present

## 2022-07-28 DIAGNOSIS — M5459 Other low back pain: Secondary | ICD-10-CM | POA: Diagnosis not present

## 2022-07-28 DIAGNOSIS — M6281 Muscle weakness (generalized): Secondary | ICD-10-CM | POA: Diagnosis not present

## 2022-07-28 DIAGNOSIS — R42 Dizziness and giddiness: Secondary | ICD-10-CM | POA: Diagnosis not present

## 2022-07-28 DIAGNOSIS — R2689 Other abnormalities of gait and mobility: Secondary | ICD-10-CM | POA: Diagnosis not present

## 2022-07-28 DIAGNOSIS — R41841 Cognitive communication deficit: Secondary | ICD-10-CM | POA: Diagnosis not present

## 2022-08-01 DIAGNOSIS — Z9181 History of falling: Secondary | ICD-10-CM | POA: Diagnosis not present

## 2022-08-01 DIAGNOSIS — M5459 Other low back pain: Secondary | ICD-10-CM | POA: Diagnosis not present

## 2022-08-01 DIAGNOSIS — R41841 Cognitive communication deficit: Secondary | ICD-10-CM | POA: Diagnosis not present

## 2022-08-01 DIAGNOSIS — R42 Dizziness and giddiness: Secondary | ICD-10-CM | POA: Diagnosis not present

## 2022-08-01 DIAGNOSIS — F02A18 Dementia in other diseases classified elsewhere, mild, with other behavioral disturbance: Secondary | ICD-10-CM | POA: Diagnosis not present

## 2022-08-01 DIAGNOSIS — R2689 Other abnormalities of gait and mobility: Secondary | ICD-10-CM | POA: Diagnosis not present

## 2022-08-01 DIAGNOSIS — M6281 Muscle weakness (generalized): Secondary | ICD-10-CM | POA: Diagnosis not present

## 2022-08-03 ENCOUNTER — Telehealth: Payer: Self-pay | Admitting: Pulmonary Disease

## 2022-08-03 NOTE — Telephone Encounter (Signed)
Bukky from [facility] has called back - they can not administer neb meds until they have complete order.  Please fax to # given in original message or can call [facility] at [phone].

## 2022-08-04 DIAGNOSIS — R42 Dizziness and giddiness: Secondary | ICD-10-CM | POA: Diagnosis not present

## 2022-08-04 DIAGNOSIS — M6281 Muscle weakness (generalized): Secondary | ICD-10-CM | POA: Diagnosis not present

## 2022-08-04 DIAGNOSIS — F02A18 Dementia in other diseases classified elsewhere, mild, with other behavioral disturbance: Secondary | ICD-10-CM | POA: Diagnosis not present

## 2022-08-04 DIAGNOSIS — R2689 Other abnormalities of gait and mobility: Secondary | ICD-10-CM | POA: Diagnosis not present

## 2022-08-04 DIAGNOSIS — M5459 Other low back pain: Secondary | ICD-10-CM | POA: Diagnosis not present

## 2022-08-04 DIAGNOSIS — R41841 Cognitive communication deficit: Secondary | ICD-10-CM | POA: Diagnosis not present

## 2022-08-05 DIAGNOSIS — R41841 Cognitive communication deficit: Secondary | ICD-10-CM | POA: Diagnosis not present

## 2022-08-05 DIAGNOSIS — M5459 Other low back pain: Secondary | ICD-10-CM | POA: Diagnosis not present

## 2022-08-05 DIAGNOSIS — R42 Dizziness and giddiness: Secondary | ICD-10-CM | POA: Diagnosis not present

## 2022-08-05 DIAGNOSIS — R2689 Other abnormalities of gait and mobility: Secondary | ICD-10-CM | POA: Diagnosis not present

## 2022-08-05 DIAGNOSIS — M6281 Muscle weakness (generalized): Secondary | ICD-10-CM | POA: Diagnosis not present

## 2022-08-05 DIAGNOSIS — F02A18 Dementia in other diseases classified elsewhere, mild, with other behavioral disturbance: Secondary | ICD-10-CM | POA: Diagnosis not present

## 2022-08-05 NOTE — Telephone Encounter (Signed)
Bukky from [facility] calling back. Still unable to administer neb medications due to not having a complete order with the frequency of the medicine. Please fax order to [phone]

## 2022-08-05 NOTE — Telephone Encounter (Signed)
Signed order faxed back to [facility].

## 2022-08-09 DIAGNOSIS — F02A18 Dementia in other diseases classified elsewhere, mild, with other behavioral disturbance: Secondary | ICD-10-CM | POA: Diagnosis not present

## 2022-08-09 DIAGNOSIS — M6281 Muscle weakness (generalized): Secondary | ICD-10-CM | POA: Diagnosis not present

## 2022-08-09 DIAGNOSIS — M5459 Other low back pain: Secondary | ICD-10-CM | POA: Diagnosis not present

## 2022-08-09 DIAGNOSIS — R42 Dizziness and giddiness: Secondary | ICD-10-CM | POA: Diagnosis not present

## 2022-08-09 DIAGNOSIS — R41841 Cognitive communication deficit: Secondary | ICD-10-CM | POA: Diagnosis not present

## 2022-08-09 DIAGNOSIS — R2689 Other abnormalities of gait and mobility: Secondary | ICD-10-CM | POA: Diagnosis not present

## 2022-08-10 DIAGNOSIS — R42 Dizziness and giddiness: Secondary | ICD-10-CM | POA: Diagnosis not present

## 2022-08-10 DIAGNOSIS — M6281 Muscle weakness (generalized): Secondary | ICD-10-CM | POA: Diagnosis not present

## 2022-08-10 DIAGNOSIS — R2689 Other abnormalities of gait and mobility: Secondary | ICD-10-CM | POA: Diagnosis not present

## 2022-08-10 DIAGNOSIS — R41841 Cognitive communication deficit: Secondary | ICD-10-CM | POA: Diagnosis not present

## 2022-08-10 DIAGNOSIS — F02A18 Dementia in other diseases classified elsewhere, mild, with other behavioral disturbance: Secondary | ICD-10-CM | POA: Diagnosis not present

## 2022-08-10 DIAGNOSIS — M5459 Other low back pain: Secondary | ICD-10-CM | POA: Diagnosis not present

## 2022-08-11 DIAGNOSIS — F02A18 Dementia in other diseases classified elsewhere, mild, with other behavioral disturbance: Secondary | ICD-10-CM | POA: Diagnosis not present

## 2022-08-11 DIAGNOSIS — R41841 Cognitive communication deficit: Secondary | ICD-10-CM | POA: Diagnosis not present

## 2022-08-11 DIAGNOSIS — R42 Dizziness and giddiness: Secondary | ICD-10-CM | POA: Diagnosis not present

## 2022-08-11 DIAGNOSIS — M5459 Other low back pain: Secondary | ICD-10-CM | POA: Diagnosis not present

## 2022-08-11 DIAGNOSIS — M6281 Muscle weakness (generalized): Secondary | ICD-10-CM | POA: Diagnosis not present

## 2022-08-11 DIAGNOSIS — R2689 Other abnormalities of gait and mobility: Secondary | ICD-10-CM | POA: Diagnosis not present

## 2022-08-16 DIAGNOSIS — M6281 Muscle weakness (generalized): Secondary | ICD-10-CM | POA: Diagnosis not present

## 2022-08-16 DIAGNOSIS — R2689 Other abnormalities of gait and mobility: Secondary | ICD-10-CM | POA: Diagnosis not present

## 2022-08-16 DIAGNOSIS — M5459 Other low back pain: Secondary | ICD-10-CM | POA: Diagnosis not present

## 2022-08-16 DIAGNOSIS — R41841 Cognitive communication deficit: Secondary | ICD-10-CM | POA: Diagnosis not present

## 2022-08-16 DIAGNOSIS — R42 Dizziness and giddiness: Secondary | ICD-10-CM | POA: Diagnosis not present

## 2022-08-16 DIAGNOSIS — F02A18 Dementia in other diseases classified elsewhere, mild, with other behavioral disturbance: Secondary | ICD-10-CM | POA: Diagnosis not present

## 2022-08-17 ENCOUNTER — Telehealth: Payer: Self-pay | Admitting: Family Medicine

## 2022-08-17 NOTE — Telephone Encounter (Signed)
.  Type of form received: FL2  Additional comments:   Received by: Adonis Brook  Form should be Faxed to: [phone]  Form should be mailed to:    Is patient requesting call for pickup:   Form placed: In provider's box  Attach charge sheet. yes  Individual made aware of 3-5 business day turn around (Y/N)?  No

## 2022-08-22 ENCOUNTER — Ambulatory Visit (INDEPENDENT_AMBULATORY_CARE_PROVIDER_SITE_OTHER): Payer: Medicare Other | Admitting: Family

## 2022-08-22 ENCOUNTER — Encounter: Payer: Self-pay | Admitting: Family

## 2022-08-22 VITALS — BP 169/76 | HR 68 | Temp 98.0°F | Ht 63.0 in | Wt 161.4 lb

## 2022-08-22 DIAGNOSIS — R110 Nausea: Secondary | ICD-10-CM | POA: Diagnosis not present

## 2022-08-22 DIAGNOSIS — R6883 Chills (without fever): Secondary | ICD-10-CM

## 2022-08-22 MED ORDER — ONDANSETRON 4 MG PO TBDP: 4.0000 mg | ORAL_TABLET | Freq: Three times a day (TID) | ORAL | 0 refills | Status: DC | PRN

## 2022-08-22 NOTE — Progress Notes (Unsigned)
Patient ID: Sara Medina, female    DOB: 03-07-1930, 89 y.o.   MRN: [id number]  Chief Complaint  Patient presents with   Chills    sx for 2 d   HPI:      Chills/cold sweat/nausea:  Pt is here with daughters to help with providing history. Pt  c/o sx starting about 2-3d ago and dtr witnessed pt in person with cold sweats, chills, & c/o nausea but no vomiting, also some fatigue. Denies any pain, sinus sx or cough, no UTI sx. Denies heart palpitations, tachycardia, no CP. No new or worsened confusion. Sx last for about an hour or a hour and a half. Present for a couple of days. Pt has been moved from one facility to another & back home; dtrs are wondering if the stress of moving has brought on her sx.  Denies any stomach pain or bowel problems, no headache, no fever, temporal temp checked was actually low at 95.6 at home.  Assessment & Plan:  1. Chills (without fever) - w/clammy skin; does not have sx currently; pt denies any other sx related to infection. Believe a manifestation of her anxiety r/t recent move, possibly. Ok to give Tylenol prn.  Call next week if sx are not resolving.  2. Nausea - sending Zofran to take tid prn, encourage small bites of bland food, broth, importance of hydration. May be d/t vertigo brought on by recent moving stress, pt has Alzheimers and can't always express her symptoms accurately, advised to try the Meclizine given previously to help sx. Call next week if sx are not resolving.  - ondansetron (ZOFRAN-ODT) 4 MG disintegrating tablet; Take 1 tablet (4 mg total) by mouth every 8 (eight) hours as needed for nausea or vomiting.  Dispense: 20 tablet; Refill: 0   Subjective:    Outpatient Medications Prior to Visit  Medication Sig Dispense Refill   albuterol (VENTOLIN HFA) 108 (90 Base) MCG/ACT inhaler INHALE TWO PUFFS BY MOUTH INTO LUNGS EVERY 6 HOURS AS NEEDED FOR WHEEZING/SHORTNESS OF BREATH 18 g 4   amLODipine (NORVASC) 5 MG tablet TAKE ONE TABLET BY MOUTH  TWICE A DAY 180 tablet 3   busPIRone (BUSPAR) 5 MG tablet TAKE ONE TABLET BY MOUTH TWICE A DAY AS NEEDED 60 tablet 5   calcitonin, salmon, (MIACALCIN) 200 UNIT/ACT nasal spray Place 1 spray into alternate nostrils daily. For 1 month 3.7 mL 1   Cholecalciferol (VITAMIN D-3) 25 MCG (1000 UT) CAPS Take 1,000 Units by mouth daily.     citalopram (CELEXA) 10 MG tablet Take 1 tablet (10 mg total) by mouth daily. 90 tablet 3   EPINEPHrine 0.3 mg/0.3 mL IJ SOAJ injection Inject 0.3 mg into the muscle as needed for anaphylaxis. 1 each 1   fluticasone (FLONASE) 50 MCG/ACT nasal spray Place 1 spray into both nostrils daily. 16 g 3   gabapentin (NEURONTIN) 300 MG capsule Take up to 4 capsules (up to 1200mg ) at bedtime for cramps. 120 capsule 11   levothyroxine (SYNTHROID) 25 MCG tablet TAKE ONE TABLET BY MOUTH DAILY BEFORE BREAKFAST 90 tablet 3   meclizine (ANTIVERT) 12.5 MG tablet Take 1 tablet (12.5 mg total) by mouth 3 (three) times daily as needed for dizziness. 30 tablet 0   memantine (NAMENDA) 10 MG tablet Take 1 tablet (10 mg total) by mouth 2 (two) times daily. 180 tablet 11   METAMUCIL FIBER PO Take by mouth.     metoprolol tartrate (LOPRESSOR) 25 MG tablet TAKE TWO TABLETS  BY MOUTH EVERY MORNING AND TAKE ONE TABLET BY MOUTH EVERY EVENING 270 tablet 3   pantoprazole (PROTONIX) 20 MG tablet Take 1 tablet (20 mg total) by mouth daily. 90 tablet 3   Probiotic Product (ALIGN) 4 MG CAPS Take 4 mg by mouth daily.     Pyridoxine HCl (VITAMIN B-6 PO) Take 1 tablet by mouth daily.     rivastigmine (EXELON) 1.5 MG capsule Start with one pill at bedtime. And in 2 weeks if no side effects increase to twice a day. 60 capsule 3   SPIKEVAX 50 MCG/0.5ML SUSP Inject into the muscle as directed. Done at [facility]     traMADol (ULTRAM) 50 MG tablet Take 1 tablet (50 mg total) by mouth every 8 (eight) hours as needed for moderate pain or severe pain (of the back). 30 tablet 1   vitamin B-12 (CYANOCOBALAMIN) 500 MCG  tablet Take 500 mcg by mouth 2 (two) times daily.     fluticasone furoate-vilanterol (BREO ELLIPTA) 200-25 MCG/ACT AEPB Inhale 1 puff into the lungs daily. (Patient not taking: Reported on 08/22/2022) 1 each 5   No facility-administered medications prior to visit.   Past Medical History:  Diagnosis Date   Allergic rhinitis 05/04/2010   Asthma, exercise induced 08/18/2010    She uses the Proventil inhaler on rare occasions not on a daily basis    Benign neoplasm of colon 05/04/2010   Burning sensation of feet 06/21/2020   Chest tightness or pressure 05/13/2013   Chronic diarrhea 12/30/2020   Cystocele 10/02/2014   Essential hypertension 05/04/2010   GERD (gastroesophageal reflux disease) 05/04/2010   Pantoprazole 40- down to 20 for diarrhea    History of diverticulitis 05/04/2010   Hyperlipidemia 05/04/2010   Hypothyroidism 05/04/2010   Major depressive disorder    Menopausal disorder 08/18/2010   When she went off the HRT she had severe mood disorder    Mitral valve prolapse    Moderate dementia, without behavioral disturbance 09/07/2021   Neuropathy    Osteoarthritis 05/04/2010   Palpitations 06/30/2014   Pneumonia    S/P lumbar fusion 01/27/2021   Spinal stenosis    Past Surgical History:  Procedure Laterality Date   ABDOMINAL HYSTERECTOMY     BACK SURGERY     CATARACT EXTRACTION Bilateral    cataract surgery     COLONOSCOPY     EYE SURGERY     LAMINECTOMY WITH POSTERIOR LATERAL ARTHRODESIS LEVEL 1 N/A 01/27/2021   Procedure: Laminectomy and Foraminotomy - Lumbar Three-Four/Lumbar Four-Five, posterior fusion with fixation Lumbar Four-Five;  Surgeon: Eustace Moore, MD;  Location: [place];  Service: Neurosurgery;  Laterality: N/A;   SHOULDER SURGERY Right    TONSILLECTOMY     trigger thumb Right    TUBAL LIGATION     WISDOM TOOTH EXTRACTION     x4   Allergies  Allergen Reactions   Other Anaphylaxis, Shortness Of Breath, Swelling and Rash    ALL NUTS!!!!    Peanut-Containing Drug Products Anaphylaxis, Shortness Of Breath, Swelling and Rash   Pecan Nut (Diagnostic) Anaphylaxis, Shortness Of Breath and Rash   Penicillins Swelling    Full facial swelling Did it involve swelling of the face/tongue/throat, SOB, or low BP? Yes Did it involve sudden or severe rash/hives, skin peeling, or any reaction on the inside of your mouth or nose? Yes Did you need to seek medical attention at a hospital or doctor's office? No When did it last happen? "many years ago" If all above answers are "  NO", may proceed with cephalosporin use.       Objective:    Physical Exam Vitals and nursing note reviewed.  Constitutional:      Appearance: Normal appearance.  Cardiovascular:     Rate and Rhythm: Normal rate and regular rhythm.  Pulmonary:     Effort: Pulmonary effort is normal.     Breath sounds: Normal breath sounds.  Musculoskeletal:        General: Normal range of motion.  Skin:    General: Skin is warm and dry.  Neurological:     Mental Status: She is alert. Mental status is at baseline.     Sensory: Sensation is intact.     Motor: Motor function is intact.  Psychiatric:        Mood and Affect: Mood is anxious (mild). Mood is not depressed. Affect is not tearful.        Behavior: Behavior normal. Behavior is not agitated or withdrawn.        Cognition and Memory: Cognition is impaired. Memory is impaired.    BP (!) 169/76 (BP Location: Left Arm, Patient Position: Sitting, Cuff Size: Large)   Pulse 68   Temp 98 F (36.7 C) (Temporal)   Ht 5\' 3"  (1.6 m)   Wt 161 lb 6.4 oz (73.2 kg)   SpO2 97%   BMI 28.59 kg/m  Wt Readings from Last 3 Encounters:  08/22/22 161 lb 6.4 oz (73.2 kg)  06/24/22 166 lb 6.4 oz (75.5 kg)  06/08/22 163 lb 6.4 oz (74.1 kg)       Jeanie Sewer, NP

## 2022-08-22 NOTE — Telephone Encounter (Signed)
Paper faxed back to [facility].

## 2022-08-29 ENCOUNTER — Telehealth: Payer: Self-pay | Admitting: Family Medicine

## 2022-08-30 DIAGNOSIS — Z9181 History of falling: Secondary | ICD-10-CM | POA: Diagnosis not present

## 2022-08-30 DIAGNOSIS — F02A18 Dementia in other diseases classified elsewhere, mild, with other behavioral disturbance: Secondary | ICD-10-CM | POA: Diagnosis not present

## 2022-08-30 DIAGNOSIS — R41841 Cognitive communication deficit: Secondary | ICD-10-CM | POA: Diagnosis not present

## 2022-08-30 DIAGNOSIS — M5459 Other low back pain: Secondary | ICD-10-CM | POA: Diagnosis not present

## 2022-08-30 DIAGNOSIS — R42 Dizziness and giddiness: Secondary | ICD-10-CM | POA: Diagnosis not present

## 2022-08-30 DIAGNOSIS — M6281 Muscle weakness (generalized): Secondary | ICD-10-CM | POA: Diagnosis not present

## 2022-08-30 DIAGNOSIS — R2689 Other abnormalities of gait and mobility: Secondary | ICD-10-CM | POA: Diagnosis not present

## 2022-08-30 NOTE — Telephone Encounter (Signed)
error 

## 2022-09-02 ENCOUNTER — Other Ambulatory Visit: Payer: Self-pay | Admitting: Neurology

## 2022-09-02 ENCOUNTER — Ambulatory Visit (INDEPENDENT_AMBULATORY_CARE_PROVIDER_SITE_OTHER): Payer: Medicare Other | Admitting: Family Medicine

## 2022-09-02 ENCOUNTER — Encounter: Payer: Self-pay | Admitting: Family Medicine

## 2022-09-02 VITALS — BP 160/86 | HR 71 | Temp 97.5°F | Ht 63.0 in | Wt 160.2 lb

## 2022-09-02 DIAGNOSIS — F03B0 Unspecified dementia, moderate, without behavioral disturbance, psychotic disturbance, mood disturbance, and anxiety: Secondary | ICD-10-CM

## 2022-09-02 DIAGNOSIS — F0280 Dementia in other diseases classified elsewhere without behavioral disturbance: Secondary | ICD-10-CM

## 2022-09-02 DIAGNOSIS — R052 Subacute cough: Secondary | ICD-10-CM

## 2022-09-02 DIAGNOSIS — F324 Major depressive disorder, single episode, in partial remission: Secondary | ICD-10-CM | POA: Diagnosis not present

## 2022-09-02 DIAGNOSIS — Z79899 Other long term (current) drug therapy: Secondary | ICD-10-CM

## 2022-09-02 LAB — POC COVID19 BINAXNOW: SARS Coronavirus 2 Ag: NEGATIVE

## 2022-09-02 MED ORDER — PANTOPRAZOLE SODIUM 20 MG PO TBEC: 20.0000 mg | DELAYED_RELEASE_TABLET | Freq: Every day | ORAL | 3 refills | Status: DC

## 2022-09-02 MED ORDER — CITALOPRAM HYDROBROMIDE 20 MG PO TABS: 20.0000 mg | ORAL_TABLET | Freq: Every day | ORAL | 3 refills | Status: DC

## 2022-09-02 NOTE — Progress Notes (Signed)
Phone [phone] In person visit   Subjective:   Sara Medina is a 89 y.o. year old very pleasant female patient who presents for/with See problem oriented charting Chief Complaint  Patient presents with   Follow-up    Would like to increase citalopram dose   left toenail    Pt c/o left toe nail issue   Cough    Pt c/o productive cough    Past Medical History-  Patient Active Problem List   Diagnosis Date Noted   Moderate dementia, without behavioral disturbance 09/07/2021    Priority: High   Chronic diarrhea 12/30/2020    Priority: High   Mitral valve prolapse 09/06/2021    Priority: Medium    Neuropathy 09/06/2021    Priority: Medium    S/P lumbar fusion 01/27/2021    Priority: Medium    Burning sensation of feet 06/21/2020    Priority: Medium    Palpitations 06/30/2014    Priority: Medium    Moderate persistent asthma 08/18/2010    Priority: Medium    Hypothyroidism 05/04/2010    Priority: Medium    Hyperlipidemia 05/04/2010    Priority: Medium    Essential hypertension 05/04/2010    Priority: Medium    GERD (gastroesophageal reflux disease) 05/04/2010    Priority: Medium    Cystocele 10/02/2014    Priority: Low   Benign neoplasm of colon 05/04/2010    Priority: Low   Allergic rhinitis 05/04/2010    Priority: Low   Osteoarthritis 05/04/2010    Priority: Low   History of diverticulitis 05/04/2010    Priority: Low   Leg pain 02/21/2022    Priority: 1.   Asthma 05/04/2010    Medications- reviewed and updated Current Outpatient Medications  Medication Sig Dispense Refill   albuterol (VENTOLIN HFA) 108 (90 Base) MCG/ACT inhaler INHALE TWO PUFFS BY MOUTH INTO LUNGS EVERY 6 HOURS AS NEEDED FOR WHEEZING/SHORTNESS OF BREATH 18 g 4   amLODipine (NORVASC) 5 MG tablet TAKE ONE TABLET BY MOUTH TWICE A DAY 180 tablet 3   busPIRone (BUSPAR) 5 MG tablet TAKE ONE TABLET BY MOUTH TWICE A DAY AS NEEDED 60 tablet 5   calcitonin, salmon, (MIACALCIN) 200 UNIT/ACT  nasal spray Place 1 spray into alternate nostrils daily. For 1 month 3.7 mL 1   Cholecalciferol (VITAMIN D-3) 25 MCG (1000 UT) CAPS Take 1,000 Units by mouth daily.     citalopram (CELEXA) 20 MG tablet Take 1 tablet (20 mg total) by mouth daily. 90 tablet 3   EPINEPHrine 0.3 mg/0.3 mL IJ SOAJ injection Inject 0.3 mg into the muscle as needed for anaphylaxis. 1 each 1   fluticasone (FLONASE) 50 MCG/ACT nasal spray Place 1 spray into both nostrils daily. 16 g 3   gabapentin (NEURONTIN) 300 MG capsule Take up to 4 capsules (up to 1200mg ) at bedtime for cramps. 120 capsule 11   levothyroxine (SYNTHROID) 25 MCG tablet TAKE ONE TABLET BY MOUTH DAILY BEFORE BREAKFAST 90 tablet 3   meclizine (ANTIVERT) 12.5 MG tablet Take 1 tablet (12.5 mg total) by mouth 3 (three) times daily as needed for dizziness. 30 tablet 0   memantine (NAMENDA) 10 MG tablet Take 1 tablet (10 mg total) by mouth 2 (two) times daily. 180 tablet 11   METAMUCIL FIBER PO Take by mouth.     metoprolol tartrate (LOPRESSOR) 25 MG tablet TAKE TWO TABLETS BY MOUTH EVERY MORNING AND TAKE ONE TABLET BY MOUTH EVERY EVENING 270 tablet 3   ondansetron (ZOFRAN-ODT) 4 MG  disintegrating tablet Take 1 tablet (4 mg total) by mouth every 8 (eight) hours as needed for nausea or vomiting. 20 tablet 0   Probiotic Product (ALIGN) 4 MG CAPS Take 4 mg by mouth daily.     Pyridoxine HCl (VITAMIN B-6 PO) Take 1 tablet by mouth daily.     rivastigmine (EXELON) 1.5 MG capsule Start with one pill at bedtime. And in 2 weeks if no side effects increase to twice a day. 60 capsule 3   SPIKEVAX 50 MCG/0.5ML SUSP Inject into the muscle as directed. Done at [facility]     traMADol (ULTRAM) 50 MG tablet Take 1 tablet (50 mg total) by mouth every 8 (eight) hours as needed for moderate pain or severe pain (of the back). 30 tablet 1   vitamin B-12 (CYANOCOBALAMIN) 500 MCG tablet Take 500 mcg by mouth 2 (two) times daily.     pantoprazole (PROTONIX) 20 MG tablet Take 1 tablet  (20 mg total) by mouth daily. 90 tablet 3   No current facility-administered medications for this visit.     Objective:  BP (!) 160/86   Pulse 71   Temp (!) 97.5 F (36.4 C)   Ht 5\' 3"  (1.6 m)   Wt 160 lb 3.2 oz (72.7 kg)   SpO2 95%   BMI 28.38 kg/m  Gen: NAD, resting comfortably CV: RRR no murmurs rubs or gallops Lungs: CTAB no crackles, wheeze, rhonchi Abdomen: soft/nontender/nondistended/normal bowel sounds. No rebound or guarding.  Ext: no edema Skin: warm, dry Neuro: grossly normal, moves all extremities   EKG: sinus rhythm other than first degree advise block with rate 69, normal axis, normal intervals other than prolonged PR, no hypertrophy, no st or t wave changes  -Specifically QT interval is not prolonged    Assessment and Plan   # Depression/anxiety/hot flashes S: Medication:Citalopram 10 mg (originally chosen to help with hot flashes) # Buspirone 5 mg twice daily as needed -moved about 2 weeks ago but in recent months has been more irritable-gets easily frustrated when spending time with both daughters    09/02/2022    2:14 PM 05/10/2022    2:09 PM 04/29/2021    2:40 PM  Depression screen PHQ 2/9  Decreased Interest 3 0 0  Down, Depressed, Hopeless 3 0 0  PHQ - 2 Score 6 0 0  Altered sleeping 0    Tired, decreased energy 0    Change in appetite 2    Feeling bad or failure about yourself  2    Trouble concentrating 0    Moving slowly or fidgety/restless 0    Suicidal thoughts 2    PHQ-9 Score 12    Difficult doing work/chores Very difficult    A/P: Appears to have worsening depression.  Alzheimer's/moderate dementia disease likely contributes (Alzheimer's appears stable memantine-continue current medication). - Patient and family desire to increase citalopram-we updated an EKG to make sure this close safe for QT interval and QT was normal - Maintain buspirone at current dose - Refer to behavioral health -[facility] with [facility] with alzheimers  #hypertension S: medication: Amlodipine 5 mg twice daily , metoprolol 50 mg in the morning and 25 mg in the evening BP Readings from Last 3 Encounters:  09/02/22 (!) 160/86  08/22/22 (!) 169/76  06/24/22 136/76  A/P: Blood pressure typically well-controlled but high today-patient reports increased irritability and we wonder if this could be a contributing factor-family will check at home over the next week and  update me-for now continue current medication  # Left toenail concern-toenails have become more difficult to trim and have elongated-she stubbed one of the toenails today and pulled back a portion-discussed conservative care options as well as option for podiatry versus being seen at a nail salon-they will think over options  # Early satiety-no unintentional weight loss but patient reports feeling full for a long time.  Benign abdominal exam.  Discussed GI referral to consider endoscopy versus CT scan (but with no pain on exam we opted against this)-she declines GI referral at this time unless symptoms worsen  # Cough in patient with asthma S: Patient with history of asthma-prior to last visit had stopped Breo due to dry mouth and mouth sores and was using albuterol only as needed-no substantial worsening at that time -Cough has been worse since coming off of Breo -No chest pain or shortness of breath reported  A/P: Asthma with worsening cough since coming off of Breo-with that being said she feels like the side effects of that were greater than the benefits and other medications could have similar risk profiles-she prefers to simply cough-not having a lot of wheezing or shortness of breath so we thought this was reasonable   Recommended follow up: Return in about 2 months (around 11/02/2022) for followup or sooner if needed.Schedule b4 you leave. Future Appointments  Date Time Provider Department Center  11/03/2022  1:20 PM Shelva MajesticHunter, Marris Frontera O, MD [facility]   11/09/2022  3:00 PM Anson FretAhern, Antonia B, MD [facility] None  05/16/2023  1:45 PM [facility] ANNUAL WELLNESS VISIT 1 [facility]    Lab/Order associations:   ICD-10-CM   1. Depression, major, single episode, in partial remission  F32.4 Ambulatory referral to Psychology    EKG 12-Lead    2. High risk medication use  Z79.899 EKG 12-Lead    3. Subacute cough  R05.2 POC COVID-19    4. Alzheimer disease Chronic G30.9    F02.80     5. Moderate dementia, without behavioral disturbance Chronic F03.B0       Meds ordered this encounter  Medications   pantoprazole (PROTONIX) 20 MG tablet    Sig: Take 1 tablet (20 mg total) by mouth daily.    Dispense:  90 tablet    Refill:  3   citalopram (CELEXA) 20 MG tablet    Sig: Take 1 tablet (20 mg total) by mouth daily.    Dispense:  90 tablet    Refill:  3    Return precautions advised.  Tana ConchStephen Loranzo Desha, MD

## 2022-09-02 NOTE — Patient Instructions (Addendum)
Consider Tetanus, Diphtheria, and Pertussis (Tdap) at pharmacy (not covered here)   Please call [phone] to schedule a visit with [facility] - please tell the office you were directly referred by Dr. Durene Cal - Dr. Monna Fam is in our office   Increase Citalopram to 20 mg from 10 mg.   Monitor home blood pressure for a week and update me- make sure medications in system at least 2 hours and at rest when taking blood pressure T   aking the medicine as directed and not missing any doses is one of the best things you can do to treat your depression.  Here are some things to keep in mind:  Side effects (stomach upset, some increased anxiety) may happen before you notice a benefit.  These side effects typically go away over time. Changes to your dose of medicine or a change in medication all together is sometimes necessary Most people need to be on medication at least 6-12 months- we anticipate yours being long term Many people will notice an improvement within two weeks but the full effect of the medication can take up to 6-8 weeks Stopping the medication when you start feeling better often results in a return of symptoms If you start having thoughts of hurting yourself or others after starting this medicine, call our office immediately at [phone] or seek care through 911.    Recommended follow up: Return in about 2 months (around 11/02/2022) for followup or sooner if needed.Schedule b4 you leave.

## 2022-09-05 ENCOUNTER — Telehealth: Payer: Self-pay | Admitting: Family Medicine

## 2022-09-05 NOTE — Telephone Encounter (Signed)
..  Type of form received:adult day care medical examination report   Additional comments:   Received GB:[facility] [employer]  Form should be Faxed [phone]  Form should be mailed to:  na  Is patient requesting call for pickup: NO- BUT daughter wants a call once faxed. [name] [phone]   Form placed:  Dr Pamala Hurry folder   Attach charge sheet.  Yes   Individual made aware of 3-5 business day turn around (Y/N)?   Yes

## 2022-09-06 DIAGNOSIS — R41841 Cognitive communication deficit: Secondary | ICD-10-CM | POA: Diagnosis not present

## 2022-09-06 DIAGNOSIS — R42 Dizziness and giddiness: Secondary | ICD-10-CM | POA: Diagnosis not present

## 2022-09-06 DIAGNOSIS — M6281 Muscle weakness (generalized): Secondary | ICD-10-CM | POA: Diagnosis not present

## 2022-09-06 DIAGNOSIS — R2689 Other abnormalities of gait and mobility: Secondary | ICD-10-CM | POA: Diagnosis not present

## 2022-09-06 DIAGNOSIS — F02A18 Dementia in other diseases classified elsewhere, mild, with other behavioral disturbance: Secondary | ICD-10-CM | POA: Diagnosis not present

## 2022-09-06 DIAGNOSIS — M5459 Other low back pain: Secondary | ICD-10-CM | POA: Diagnosis not present

## 2022-09-06 NOTE — Telephone Encounter (Signed)
Form placed in your to sign folder.

## 2022-09-06 NOTE — Telephone Encounter (Signed)
Completed will place on your desk tomorrow

## 2022-09-07 NOTE — Telephone Encounter (Signed)
Form faxed, called and lm on [name] vm making her aware.

## 2022-09-08 ENCOUNTER — Telehealth: Payer: Self-pay | Admitting: Family Medicine

## 2022-09-08 NOTE — Telephone Encounter (Signed)
Pt needs a DNR form to be competed. Please call pts daughter back for her to pick it up.

## 2022-09-09 ENCOUNTER — Encounter: Payer: Self-pay | Admitting: Neurology

## 2022-09-09 NOTE — Telephone Encounter (Signed)
Called and spoke with [name] since Lynn's number was not left in the message and made aware that DNR ready and up front for pick up.

## 2022-09-09 NOTE — Telephone Encounter (Signed)
Pt's daughter called wanting to know why this medication was denied for a refill. Please advise.

## 2022-09-12 NOTE — Telephone Encounter (Signed)
Spoke with daughter Ms. Lynnea Ferrier to verify dosing patient is getting and she states she typically gets (3) 300mg  gabapentin at bedtime for pain. Informed Ms. Lynnea Ferrier that our active script is up to (4) 300mg  gabapentin at bedtime but pharmacy had requested refill for (3) 300mg  at bedtime and daughter states that she would like to keep the active script due to an extra dose if needed for pain. Daughter appreciative of call.

## 2022-09-13 DIAGNOSIS — R2689 Other abnormalities of gait and mobility: Secondary | ICD-10-CM | POA: Diagnosis not present

## 2022-09-13 DIAGNOSIS — R42 Dizziness and giddiness: Secondary | ICD-10-CM | POA: Diagnosis not present

## 2022-09-13 DIAGNOSIS — M5459 Other low back pain: Secondary | ICD-10-CM | POA: Diagnosis not present

## 2022-09-13 DIAGNOSIS — M6281 Muscle weakness (generalized): Secondary | ICD-10-CM | POA: Diagnosis not present

## 2022-09-13 DIAGNOSIS — F02A18 Dementia in other diseases classified elsewhere, mild, with other behavioral disturbance: Secondary | ICD-10-CM | POA: Diagnosis not present

## 2022-09-13 DIAGNOSIS — R41841 Cognitive communication deficit: Secondary | ICD-10-CM | POA: Diagnosis not present

## 2022-09-14 ENCOUNTER — Telehealth: Payer: Self-pay | Admitting: Family Medicine

## 2022-09-14 NOTE — Telephone Encounter (Signed)
Patient dropped off document  [facility] form [facility] medication rx for [facility] , to be filled out by provider. Patient requested to send it via Fax within 2-days. Document is located in providers tray at front office.Please advise at Mobile [phone] (mobile)     [facility] - Form to be faxed to [phone] - daughter wantsa call at [phone].   [facility] also wants refills on form. They need actual refills before taking her in on Monday 4/22.

## 2022-09-19 ENCOUNTER — Other Ambulatory Visit: Payer: Self-pay

## 2022-09-19 ENCOUNTER — Telehealth: Payer: Self-pay | Admitting: Family Medicine

## 2022-09-19 MED ORDER — ALBUTEROL SULFATE HFA 108 (90 BASE) MCG/ACT IN AERS: INHALATION_SPRAY | RESPIRATORY_TRACT | 4 refills | Status: DC

## 2022-09-19 MED ORDER — EPINEPHRINE 0.3 MG/0.3ML IJ SOAJ: 0.3000 mg | INTRAMUSCULAR | 1 refills | Status: DC | PRN

## 2022-09-19 NOTE — Telephone Encounter (Signed)
Prescription Request  Caller states the Facility needs the following RX's to be sent before Patient can attend program at Facility  09/19/2022  LOV: 09/02/2022  What is the name of the medication or equipment? albuterol (VENTOLIN HFA) 108 (90 Base) MCG/ACT inhaler AND   EPINEPHrine 0.3 mg/0.3 mL IJ SOAJ injection   Have you contacted your pharmacy to request a refill? No   Which pharmacy would you like this sent to?  [facility] [id number] [place], [place] - [address] [facility] [address] [place] [address] [place] [address] Phone: [phone] Fax: [phone]    Patient notified that their request is being sent to the clinical staff for review and that they should receive a response within 2 business days.   Please advise at Mobile [phone] (mobile)

## 2022-09-19 NOTE — Telephone Encounter (Signed)
Completed paperwork given to [place] at the front desk

## 2022-09-19 NOTE — Telephone Encounter (Signed)
Please call pts daughter with details.

## 2022-09-19 NOTE — Telephone Encounter (Signed)
Patient's daughter states [facility] form was missing a box that needed to have a check mark. Requests on completed form be re-faxed.

## 2022-09-19 NOTE — Telephone Encounter (Signed)
Paper fixed and Sara Medina is refaxing

## 2022-09-19 NOTE — Telephone Encounter (Signed)
Daughter is calling back about this form and needs it faxed ASAP. Please advise.

## 2022-09-19 NOTE — Telephone Encounter (Signed)
Refill sent to pharmacy.   

## 2022-09-20 DIAGNOSIS — M6281 Muscle weakness (generalized): Secondary | ICD-10-CM | POA: Diagnosis not present

## 2022-09-20 DIAGNOSIS — M5459 Other low back pain: Secondary | ICD-10-CM | POA: Diagnosis not present

## 2022-09-20 DIAGNOSIS — F02A18 Dementia in other diseases classified elsewhere, mild, with other behavioral disturbance: Secondary | ICD-10-CM | POA: Diagnosis not present

## 2022-09-20 DIAGNOSIS — R41841 Cognitive communication deficit: Secondary | ICD-10-CM | POA: Diagnosis not present

## 2022-09-20 DIAGNOSIS — R2689 Other abnormalities of gait and mobility: Secondary | ICD-10-CM | POA: Diagnosis not present

## 2022-09-20 DIAGNOSIS — R42 Dizziness and giddiness: Secondary | ICD-10-CM | POA: Diagnosis not present

## 2022-09-27 DIAGNOSIS — F02A18 Dementia in other diseases classified elsewhere, mild, with other behavioral disturbance: Secondary | ICD-10-CM | POA: Diagnosis not present

## 2022-09-27 DIAGNOSIS — M6281 Muscle weakness (generalized): Secondary | ICD-10-CM | POA: Diagnosis not present

## 2022-09-27 DIAGNOSIS — R42 Dizziness and giddiness: Secondary | ICD-10-CM | POA: Diagnosis not present

## 2022-09-27 DIAGNOSIS — M5459 Other low back pain: Secondary | ICD-10-CM | POA: Diagnosis not present

## 2022-09-27 DIAGNOSIS — R41841 Cognitive communication deficit: Secondary | ICD-10-CM | POA: Diagnosis not present

## 2022-09-27 DIAGNOSIS — R2689 Other abnormalities of gait and mobility: Secondary | ICD-10-CM | POA: Diagnosis not present

## 2022-09-29 ENCOUNTER — Ambulatory Visit: Payer: Medicare Other | Admitting: Family Medicine

## 2022-10-04 DIAGNOSIS — R2689 Other abnormalities of gait and mobility: Secondary | ICD-10-CM | POA: Diagnosis not present

## 2022-10-04 DIAGNOSIS — M6281 Muscle weakness (generalized): Secondary | ICD-10-CM | POA: Diagnosis not present

## 2022-10-04 DIAGNOSIS — R42 Dizziness and giddiness: Secondary | ICD-10-CM | POA: Diagnosis not present

## 2022-10-04 DIAGNOSIS — M5459 Other low back pain: Secondary | ICD-10-CM | POA: Diagnosis not present

## 2022-10-11 DIAGNOSIS — R42 Dizziness and giddiness: Secondary | ICD-10-CM | POA: Diagnosis not present

## 2022-10-11 DIAGNOSIS — M6281 Muscle weakness (generalized): Secondary | ICD-10-CM | POA: Diagnosis not present

## 2022-10-11 DIAGNOSIS — R2689 Other abnormalities of gait and mobility: Secondary | ICD-10-CM | POA: Diagnosis not present

## 2022-10-11 DIAGNOSIS — M5459 Other low back pain: Secondary | ICD-10-CM | POA: Diagnosis not present

## 2022-10-18 ENCOUNTER — Ambulatory Visit (INDEPENDENT_AMBULATORY_CARE_PROVIDER_SITE_OTHER): Payer: Medicare Other | Admitting: Physician Assistant

## 2022-10-18 ENCOUNTER — Encounter: Payer: Self-pay | Admitting: Physician Assistant

## 2022-10-18 VITALS — BP 120/76 | HR 80 | Temp 98.2°F | Ht 63.0 in | Wt 161.2 lb

## 2022-10-18 DIAGNOSIS — K5900 Constipation, unspecified: Secondary | ICD-10-CM

## 2022-10-18 DIAGNOSIS — R2689 Other abnormalities of gait and mobility: Secondary | ICD-10-CM | POA: Diagnosis not present

## 2022-10-18 DIAGNOSIS — R42 Dizziness and giddiness: Secondary | ICD-10-CM | POA: Diagnosis not present

## 2022-10-18 DIAGNOSIS — M6281 Muscle weakness (generalized): Secondary | ICD-10-CM | POA: Diagnosis not present

## 2022-10-18 DIAGNOSIS — M5459 Other low back pain: Secondary | ICD-10-CM | POA: Diagnosis not present

## 2022-10-18 NOTE — Progress Notes (Signed)
Sara Medina is a 89 y.o. female here for a new problem.  History of Present Illness:   Chief Complaint  Patient presents with   Constipation    Pt has been having trouble moving her bowels.   Here with her daughter [name]  Constipation     Constipation Daughter is with patient today who supplements history When she feels like she needs to have a bowel movement she gets anxiety because she does not feel like she is emptying all the way She is also mention to her daughter that she is concerned that she has swallowed some sort of object that has prevented her bowels from being able to be indeed  Preoccupied with an inability to go  Has tried: -Taking 1-2 teaspoons metamucil with 4-6 oz of water daily  -4 oz prune juice -increased hydration -Dulcolax  Has required disimpaction from aids  No bloody/black stools, no tarry stools, severe abdominal or rectal pain  Had episode of stool incontinence, denies any urinary symptoms  Food intake has been reduced but weight has been stable     Past Medical History:  Diagnosis Date   Allergic rhinitis 05/04/2010   Asthma, exercise induced 08/18/2010    She uses the Proventil inhaler on rare occasions not on a daily basis    Benign neoplasm of colon 05/04/2010   Burning sensation of feet 06/21/2020   Chest tightness or pressure 05/13/2013   Chronic diarrhea 12/30/2020   Cystocele 10/02/2014   Essential hypertension 05/04/2010   GERD (gastroesophageal reflux disease) 05/04/2010   Pantoprazole 40- down to 20 for diarrhea    History of diverticulitis 05/04/2010   Hyperlipidemia 05/04/2010   Hypothyroidism 05/04/2010   Major depressive disorder    Menopausal disorder 08/18/2010   When she went off the HRT she had severe mood disorder    Mitral valve prolapse    Moderate dementia, without behavioral disturbance 09/07/2021   Neuropathy    Osteoarthritis 05/04/2010   Palpitations 06/30/2014   Pneumonia    S/P lumbar fusion  01/27/2021   Spinal stenosis      Social History   Tobacco Use   Smoking status: Never   Smokeless tobacco: Never  Vaping Use   Vaping Use: Never used  Substance Use Topics   Alcohol use: Yes    Alcohol/week: 4.0 standard drinks of alcohol    Types: 4 Glasses of wine per week   Drug use: No    Past Surgical History:  Procedure Laterality Date   ABDOMINAL HYSTERECTOMY     BACK SURGERY     CATARACT EXTRACTION Bilateral    cataract surgery     COLONOSCOPY     EYE SURGERY     LAMINECTOMY WITH POSTERIOR LATERAL ARTHRODESIS LEVEL 1 N/A 01/27/2021   Procedure: Laminectomy and Foraminotomy - Lumbar Three-Four/Lumbar Four-Five, posterior fusion with fixation Lumbar Four-Five;  Surgeon: Tia Alert, MD;  Location: [facility] OR;  Service: Neurosurgery;  Laterality: N/A;   SHOULDER SURGERY Right    TONSILLECTOMY     trigger thumb Right    TUBAL LIGATION     WISDOM TOOTH EXTRACTION     x4    Family History  Problem Relation Age of Onset   Ovarian cancer Mother        in 59s   Heart disease Father        died at 49   Stroke Father    Allergies Father    Arthritis Father    Dementia Father  Cancer Sister        brain. 64 died   Healthy Sister    Other Brother        died at pneumonia young   Breast cancer Maternal Aunt    Lung cancer Daughter    Colon cancer Neg Hx    Alzheimer's disease Neg Hx     Allergies  Allergen Reactions   Other Anaphylaxis, Shortness Of Breath, Swelling and Rash    ALL NUTS!!!!   Peanut-Containing Drug Products Anaphylaxis, Shortness Of Breath, Swelling and Rash   Pecan Nut (Diagnostic) Anaphylaxis, Shortness Of Breath and Rash   Penicillins Swelling    Full facial swelling Did it involve swelling of the face/tongue/throat, SOB, or low BP? Yes Did it involve sudden or severe rash/hives, skin peeling, or any reaction on the inside of your mouth or nose? Yes Did you need to seek medical attention at a hospital or doctor's office? No When did  it last happen? "many years ago" If all above answers are "NO", may proceed with cephalosporin use.     Current Medications:   Current Outpatient Medications:    acetaminophen (TYLENOL) 500 MG tablet, Take 1,000 mg by mouth every 6 (six) hours as needed., Disp: , Rfl:    albuterol (VENTOLIN HFA) 108 (90 Base) MCG/ACT inhaler, INHALE TWO PUFFS BY MOUTH INTO LUNGS EVERY 6 HOURS AS NEEDED FOR WHEEZING/SHORTNESS OF BREATH, Disp: 18 g, Rfl: 4   amLODipine (NORVASC) 5 MG tablet, TAKE ONE TABLET BY MOUTH TWICE A DAY, Disp: 180 tablet, Rfl: 3   busPIRone (BUSPAR) 5 MG tablet, TAKE ONE TABLET BY MOUTH TWICE A DAY AS NEEDED, Disp: 60 tablet, Rfl: 5   Cholecalciferol (VITAMIN D-3) 25 MCG (1000 UT) CAPS, Take 1,000 Units by mouth daily., Disp: , Rfl:    citalopram (CELEXA) 20 MG tablet, Take 1 tablet (20 mg total) by mouth daily., Disp: 90 tablet, Rfl: 3   EPINEPHrine 0.3 mg/0.3 mL IJ SOAJ injection, Inject 0.3 mg into the muscle as needed for anaphylaxis., Disp: 1 each, Rfl: 1   gabapentin (NEURONTIN) 300 MG capsule, Take up to 4 capsules (up to 1200mg ) at bedtime for cramps., Disp: 120 capsule, Rfl: 1   levothyroxine (SYNTHROID) 25 MCG tablet, TAKE ONE TABLET BY MOUTH DAILY BEFORE BREAKFAST, Disp: 90 tablet, Rfl: 3   meclizine (ANTIVERT) 12.5 MG tablet, Take 1 tablet (12.5 mg total) by mouth 3 (three) times daily as needed for dizziness., Disp: 30 tablet, Rfl: 0   memantine (NAMENDA) 10 MG tablet, Take 1 tablet (10 mg total) by mouth 2 (two) times daily., Disp: 180 tablet, Rfl: 11   METAMUCIL FIBER PO, Take by mouth., Disp: , Rfl:    metoprolol tartrate (LOPRESSOR) 25 MG tablet, TAKE TWO TABLETS BY MOUTH EVERY MORNING AND TAKE ONE TABLET BY MOUTH EVERY EVENING, Disp: 270 tablet, Rfl: 3   ondansetron (ZOFRAN-ODT) 4 MG disintegrating tablet, Take 1 tablet (4 mg total) by mouth every 8 (eight) hours as needed for nausea or vomiting., Disp: 20 tablet, Rfl: 0   pantoprazole (PROTONIX) 20 MG tablet, Take 1  tablet (20 mg total) by mouth daily., Disp: 90 tablet, Rfl: 3   Probiotic Product (ALIGN) 4 MG CAPS, Take 4 mg by mouth daily., Disp: , Rfl:    Pyridoxine HCl (VITAMIN B-6 PO), Take 1 tablet by mouth daily., Disp: , Rfl:    rivastigmine (EXELON) 1.5 MG capsule, Start with one pill at bedtime. And in 2 weeks if no side effects increase to  twice a day., Disp: 60 capsule, Rfl: 3   SPIKEVAX 50 MCG/0.5ML SUSP, Inject into the muscle as directed. Done at [employer], Disp: , Rfl:    traMADol (ULTRAM) 50 MG tablet, Take 1 tablet (50 mg total) by mouth every 8 (eight) hours as needed for moderate pain or severe pain (of the back)., Disp: 30 tablet, Rfl: 1   vitamin B-12 (CYANOCOBALAMIN) 500 MCG tablet, Take 500 mcg by mouth 2 (two) times daily., Disp: , Rfl:    calcitonin, salmon, (MIACALCIN) 200 UNIT/ACT nasal spray, Place 1 spray into alternate nostrils daily. For 1 month (Patient not taking: Reported on 10/18/2022), Disp: 3.7 mL, Rfl: 1   fluticasone (FLONASE) 50 MCG/ACT nasal spray, Place 1 spray into both nostrils daily. (Patient not taking: Reported on 10/18/2022), Disp: 16 g, Rfl: 3   Review of Systems:   Review of Systems  Gastrointestinal:  Positive for constipation.   Negative unless otherwise specified per HPI.  Vitals:   Vitals:   10/18/22 1504  BP: 120/76  Pulse: 80  Temp: 98.2 F (36.8 C)  TempSrc: Temporal  SpO2: 94%  Weight: 161 lb 4 oz (73.1 kg)  Height: 5\' 3"  (1.6 m)     Body mass index is 28.56 kg/m.  Physical Exam:   Physical Exam Constitutional:      Appearance: Normal appearance. She is well-developed.  HENT:     Head: Normocephalic and atraumatic.  Eyes:     General: Lids are normal.     Extraocular Movements: Extraocular movements intact.     Conjunctiva/sclera: Conjunctivae normal.  Pulmonary:     Effort: Pulmonary effort is normal.  Abdominal:     General: Abdomen is flat. Bowel sounds are normal.     Palpations: Abdomen is soft.     Tenderness: There  is no abdominal tenderness. There is no guarding or rebound.  Musculoskeletal:        General: Normal range of motion.     Cervical back: Normal range of motion and neck supple.  Skin:    General: Skin is warm and dry.  Neurological:     Mental Status: She is alert and oriented to person, place, and time.  Psychiatric:        Attention and Perception: Attention and perception normal.        Mood and Affect: Mood normal.        Behavior: Behavior normal.        Thought Content: Thought content normal.        Judgment: Judgment normal.     Assessment and Plan:   Constipation, unspecified constipation type No red flags on exam Provide recommendations for bowel regimen including Colace and MiraLAX daily Provided handout on better bowel health Recommend close follow-up if any new, worsening, lack of improvement of symptoms    Jarold Motto, PA-C

## 2022-10-18 NOTE — Patient Instructions (Signed)
It was great to see you!  To treat your constipation today: -Take 100 mg of docusate sodium (also known as Colace, however generic is fine!) by mouth twice daily to soften stools -Add in 1 capful of polyethylene glycol (also known as Miralax, however generic is fine!) to beverage of choice daily -Drink at least 64 oz of water daily  Goal is to have a formed, soft bowel movement regularly   -After a few days if no success, may increase to a total of 2 capfuls of polyethylene glycol/ Miralax daily  If still no results, please call the office   Take care,  Jarold Motto PA-C

## 2022-10-25 ENCOUNTER — Encounter: Payer: Self-pay | Admitting: Family Medicine

## 2022-10-25 DIAGNOSIS — M6281 Muscle weakness (generalized): Secondary | ICD-10-CM | POA: Diagnosis not present

## 2022-10-25 DIAGNOSIS — R2689 Other abnormalities of gait and mobility: Secondary | ICD-10-CM | POA: Diagnosis not present

## 2022-10-25 DIAGNOSIS — M5459 Other low back pain: Secondary | ICD-10-CM | POA: Diagnosis not present

## 2022-10-25 DIAGNOSIS — R42 Dizziness and giddiness: Secondary | ICD-10-CM | POA: Diagnosis not present

## 2022-11-01 ENCOUNTER — Other Ambulatory Visit: Payer: Self-pay | Admitting: Neurology

## 2022-11-03 ENCOUNTER — Encounter: Payer: Self-pay | Admitting: Family Medicine

## 2022-11-03 ENCOUNTER — Ambulatory Visit (INDEPENDENT_AMBULATORY_CARE_PROVIDER_SITE_OTHER): Payer: Medicare Other | Admitting: Family Medicine

## 2022-11-03 VITALS — BP 132/72 | HR 71 | Temp 98.2°F | Resp 16 | Ht 63.0 in | Wt 158.2 lb

## 2022-11-03 DIAGNOSIS — J4530 Mild persistent asthma, uncomplicated: Secondary | ICD-10-CM

## 2022-11-03 DIAGNOSIS — I10 Essential (primary) hypertension: Secondary | ICD-10-CM

## 2022-11-03 DIAGNOSIS — E039 Hypothyroidism, unspecified: Secondary | ICD-10-CM | POA: Diagnosis not present

## 2022-11-03 DIAGNOSIS — Z23 Encounter for immunization: Secondary | ICD-10-CM

## 2022-11-03 DIAGNOSIS — S41111A Laceration without foreign body of right upper arm, initial encounter: Secondary | ICD-10-CM | POA: Diagnosis not present

## 2022-11-03 MED ORDER — CITALOPRAM HYDROBROMIDE 10 MG PO TABS: 10.0000 mg | ORAL_TABLET | Freq: Every day | ORAL | 3 refills | Status: DC

## 2022-11-03 NOTE — Progress Notes (Signed)
Phone [phone] In person visit   Subjective:   Sara Medina is a 89 y.o. year old very pleasant female patient who presents for/with See problem oriented charting Chief Complaint  Patient presents with   depression/anxiety/hotflashes   Hypertension   Laceration    Cut on right wrist, HH aide requested PCP look at it    Pain    Bilateral wrist pain and bilateral shin pain   Cough    Productive cough x several months Daughters say she's had cough on and off for months   Ear Pain    Loud noises around her cause extreme ear pain to the point that she has to hold her hands over her ears    Past Medical History-  Patient Active Problem List   Diagnosis Date Noted   Moderate dementia, without behavioral disturbance 09/07/2021    Priority: High   Chronic diarrhea 12/30/2020    Priority: High   Mitral valve prolapse 09/06/2021    Priority: Medium    Neuropathy 09/06/2021    Priority: Medium    S/P lumbar fusion 01/27/2021    Priority: Medium    Burning sensation of feet 06/21/2020    Priority: Medium    Palpitations 06/30/2014    Priority: Medium    Moderate persistent asthma 08/18/2010    Priority: Medium    Hypothyroidism 05/04/2010    Priority: Medium    Hyperlipidemia 05/04/2010    Priority: Medium    Essential hypertension 05/04/2010    Priority: Medium    Asthma 05/04/2010    Priority: Medium    GERD (gastroesophageal reflux disease) 05/04/2010    Priority: Medium    Cystocele 10/02/2014    Priority: Low   Benign neoplasm of colon 05/04/2010    Priority: Low   Allergic rhinitis 05/04/2010    Priority: Low   Osteoarthritis 05/04/2010    Priority: Low   History of diverticulitis 05/04/2010    Priority: Low   Leg pain 02/21/2022    Priority: 1.    Medications- reviewed and updated Current Outpatient Medications  Medication Sig Dispense Refill   acetaminophen (TYLENOL) 500 MG tablet Take 1,000 mg by mouth every 6 (six) hours as needed.     albuterol  (VENTOLIN HFA) 108 (90 Base) MCG/ACT inhaler INHALE TWO PUFFS BY MOUTH INTO LUNGS EVERY 6 HOURS AS NEEDED FOR WHEEZING/SHORTNESS OF BREATH 18 g 4   amLODipine (NORVASC) 5 MG tablet TAKE ONE TABLET BY MOUTH TWICE A DAY 180 tablet 3   busPIRone (BUSPAR) 5 MG tablet TAKE ONE TABLET BY MOUTH TWICE A DAY AS NEEDED 60 tablet 5   calcitonin, salmon, (MIACALCIN) 200 UNIT/ACT nasal spray Place 1 spray into alternate nostrils daily. For 1 month 3.7 mL 1   Cholecalciferol (VITAMIN D-3) 25 MCG (1000 UT) CAPS Take 1,000 Units by mouth daily.     EPINEPHrine 0.3 mg/0.3 mL IJ SOAJ injection Inject 0.3 mg into the muscle as needed for anaphylaxis. 1 each 1   fluticasone (FLONASE) 50 MCG/ACT nasal spray Place 1 spray into both nostrils daily. 16 g 3   gabapentin (NEURONTIN) 300 MG capsule Take up to 4 capsules (up to 1200mg ) at bedtime for cramps. 120 capsule 1   levothyroxine (SYNTHROID) 25 MCG tablet TAKE ONE TABLET BY MOUTH DAILY BEFORE BREAKFAST 90 tablet 3   meclizine (ANTIVERT) 12.5 MG tablet Take 1 tablet (12.5 mg total) by mouth 3 (three) times daily as needed for dizziness. 30 tablet 0   memantine (NAMENDA) 10 MG  tablet Take 1 tablet (10 mg total) by mouth 2 (two) times daily. 180 tablet 11   METAMUCIL FIBER PO Take by mouth.     metoprolol tartrate (LOPRESSOR) 25 MG tablet TAKE TWO TABLETS BY MOUTH EVERY MORNING AND TAKE ONE TABLET BY MOUTH EVERY EVENING 270 tablet 3   ondansetron (ZOFRAN-ODT) 4 MG disintegrating tablet Take 1 tablet (4 mg total) by mouth every 8 (eight) hours as needed for nausea or vomiting. 20 tablet 0   pantoprazole (PROTONIX) 20 MG tablet Take 1 tablet (20 mg total) by mouth daily. 90 tablet 3   Probiotic Product (ALIGN) 4 MG CAPS Take 4 mg by mouth daily.     Pyridoxine HCl (VITAMIN B-6 PO) Take 1 tablet by mouth daily.     rivastigmine (EXELON) 1.5 MG capsule START WITH ONE CAPSULE BY MOUTH AT BEDTIME; IN TWO WEEKS IF NO SIDE EFFECTS, INCREASE TO TWICE A DAY AS DIRECTED BY YOUR  DOCTOR. 60 capsule 3   traMADol (ULTRAM) 50 MG tablet Take 1 tablet (50 mg total) by mouth every 8 (eight) hours as needed for moderate pain or severe pain (of the back). 30 tablet 1   vitamin B-12 (CYANOCOBALAMIN) 500 MCG tablet Take 500 mcg by mouth 2 (two) times daily.     citalopram (CELEXA) 10 MG tablet Take 1 tablet (10 mg total) by mouth daily. 90 tablet 3   No current facility-administered medications for this visit.     Objective:  BP 132/72   Pulse 71   Temp 98.2 F (36.8 C) (Temporal)   Resp 16   Ht 5\' 3"  (1.6 m)   Wt 158 lb 3.2 oz (71.8 kg)   SpO2 94%   BMI 28.02 kg/m  Gen: NAD, resting comfortably Normal tympanic membranes bilaterally CV: RRR no murmurs rubs or gallops Lungs: CTAB no crackles, wheeze, rhonchi Ext: no edema Skin: warm, dry    Assessment and Plan   # Cut on right wrist-Home health aide requested we take a look at this-this occurred about a week ago  -We did opt to update Td with last completed in 2013   # Musculoskeletal S: Reports pain in both wrists and both shins A/P: shins do show signs of venous insufficiency and skin changes- mentioned compression but she doesn't tolerate that well- can try elevation or foot pedals while seated at home nad walking 2-3 x a week.  - could try Voltaren gel for the wrists- arthritis noted on prior x-rays in right wrist  # Intermittent cough in patient with asthma (trouble with inhalers)  S: Reports productive cough for several months.  Daughters weigh-in that cough has been off and on for several months -Patient does have asthma history and prior to April visit patient had stopped Breo due to dry mouth and mouth sores (did rinse mouth out) and was using albuterol only as needed.  Cough is worse since coming off Breo but she felt like the side effects of the medicine were greater than the benefits.  She was not having a lot of wheezing at last visit nor today.  No significant shortness of breath- if anything has  been better  -tendency to be colder so keeps heat up which could dry apartment out -also has cat with litter box- could try dust free litter A/P: Asthma could be causing this cough but didn't tolerate the breo - I like the idea of trying to not keep heat up so much in apartment - also I like idea of  dust free litter  to see if makes impact -if worsens lets consider x-ray- we could also try a different inhaler to see if has same impact -doesn't do best with inhaler due to wrist and hand pain   # Ear discomfort S:for extended period, Patient has noted very bothersome ear pain where she has to hold her hands over her ears when exposed to loud noises. One particular trigger is banging pot and pans. Mildly worsening over time  A/P: plans to try noise dampening headphones- I think that's a great idea -tympanic membrane normal bilaterally   # Depression/anxiety/hot flashes S: Medication: Citalopram 20 mg (originally chosen to help with hot flashes) increased at last visit (checked EKG at that time), we maintained buspirone 5 mg twice daily -she did not tolerate the increase - seemed to cause confusion -Alzheimer's likely contributes to depression -We referred to behavioral health -[facility]     11/03/2022    1:27 PM 09/02/2022    2:14 PM 05/10/2022    2:09 PM  Depression screen PHQ 2/9  Decreased Interest 1 3 0  Down, Depressed, Hopeless 0 3 0  PHQ - 2 Score 1 6 0  Altered sleeping 3 0   Tired, decreased energy 3 0   Change in appetite 1 2   Feeling bad or failure about yourself  0 2   Trouble concentrating 3 0   Moving slowly or fidgety/restless 0 0   Suicidal thoughts 0 2   PHQ-9 Score 11 12   Difficult doing work/chores Somewhat difficult Very difficult   A/P: depression/anxiety mainly stable- continue citalopram 10 mg alone  and hoping therapy is helpful - still continue buspirone - still with some hot flashes but more  tolerable/less frequent  #hypertension S: medication: Amlodipine 5 mg twice daily, metoprolol 50 mg in the morning and 25 mg in the evening Home readings #s: reports #s can be as low as 100s and doesn't feel poorly BP Readings from Last 3 Encounters:  11/03/22 132/72  10/18/22 120/76  09/02/22 (!) 160/86  A/P: stable- continue current medicines     #hypothyroidism S: compliant On thyroid medication-levothyroxine 25 mcg Lab Results  Component Value Date   TSH 2.58 01/28/2022   A/P:hopefully stable- update TSH  today. Continue current meds for now    # GERD S:Medication: pantoprazole 20 mg A/P: controlled but they are wondering if she even needs this anymore- can trial Pepcid/famotidine over the counter twice daily before meals  -make sure cough doesn't worsen  Recommended follow up: Return in about 6 months (around 05/05/2023) for followup or sooner if needed.Schedule b4 you leave. Future Appointments  Date Time Provider Department Center  11/09/2022  3:00 PM Anson Fret, MD [facility] None  11/24/2022 10:00 AM Deneise Lever, LMFT [facility] None  05/16/2023  1:45 PM [facility] ANNUAL WELLNESS VISIT 1 [facility]    Lab/Order associations:   ICD-10-CM   1. Arm laceration, right, initial encounter  S41.111A     2. Essential hypertension  I10 Comprehensive metabolic panel    CBC with Differential/Platelet    3. Hypothyroidism, unspecified type  E03.9 TSH    4. Mild persistent asthma without complication  J45.30       Meds ordered this encounter  Medications   citalopram (CELEXA) 10 MG tablet    Sig: Take 1 tablet (10 mg total) by mouth daily.    Dispense:  90 tablet    Refill:  3  Return precautions advised.  Tana Conch, MD

## 2022-11-03 NOTE — Addendum Note (Signed)
Addended by: Laddie Aquas A on: 11/03/2022 02:31 PM   Modules accepted: Orders

## 2022-11-03 NOTE — Patient Instructions (Addendum)
Health Maintenance Due  Topic Date Due   DTaP/Tdap/Td (3 - Td or Tdap) 02/26/2022  Team please give this under right arm laceration diagnosis   - could try Voltaren gel for the wrists- arthritis noted on prior x-rays in right wrist  Asthma could be causing this cough but didn't tolerate the breo - I like the idea of trying to not keep heat up so much in apartment - also I like idea of dust free litter  to see if makes impact -if worsens lets consider x-ray- we could also try a different inhaler to see if has same impact   Reflux controlled but they are wondering if she even needs this anymore- can trial Pepcid/famotidine over the counter twice daily before meals   Please stop by lab before you go If you have mychart- we will send your results within 3 business days of [place] receiving them.  If you do not have mychart- we will call you about results within 5 business days of [place] receiving them.  *please also note that you will see labs on mychart as soon as they post. I will later go in and write notes on them- will say "notes from Dr. Durene Cal"   Recommended follow up: Return in about 6 months (around 05/05/2023) for followup or sooner if needed.Schedule b4 you leave.

## 2022-11-04 LAB — COMPREHENSIVE METABOLIC PANEL
ALT: 28 U/L (ref 0–35)
AST: 19 U/L (ref 0–37)
Albumin: 4.1 g/dL (ref 3.5–5.2)
Alkaline Phosphatase: 57 U/L (ref 39–117)
BUN: 18 mg/dL (ref 6–23)
CO2: 25 mEq/L (ref 19–32)
Calcium: 9.4 mg/dL (ref 8.4–10.5)
Chloride: 102 mEq/L (ref 96–112)
Creatinine, Ser: 0.89 mg/dL (ref 0.40–1.20)
GFR: 56.93 mL/min — ABNORMAL LOW (ref >60.00)
Glucose, Bld: 100 mg/dL — ABNORMAL HIGH (ref 70–99)
Potassium: 4.6 mEq/L (ref 3.5–5.1)
Sodium: 136 mEq/L (ref 135–145)
Total Bilirubin: 0.4 mg/dL (ref 0.2–1.2)
Total Protein: 6.6 g/dL (ref 6.0–8.3)

## 2022-11-04 LAB — CBC WITH DIFFERENTIAL/PLATELET
Basophils Absolute: 0.1 10*3/uL (ref 0.0–0.1)
Basophils Relative: 0.7 % (ref 0.0–3.0)
Eosinophils Absolute: 0.1 10*3/uL (ref 0.0–0.7)
Eosinophils Relative: 1.5 % (ref 0.0–5.0)
HCT: 40.2 % (ref 36.0–46.0)
Hemoglobin: 13.5 g/dL (ref 12.0–15.0)
Lymphocytes Relative: 17.7 % (ref 12.0–46.0)
Lymphs Abs: 1.4 10*3/uL (ref 0.7–4.0)
MCHC: 33.5 g/dL (ref 30.0–36.0)
MCV: 98.8 fl (ref 78.0–100.0)
Monocytes Absolute: 0.8 10*3/uL (ref 0.1–1.0)
Monocytes Relative: 9.8 % (ref 3.0–12.0)
Neutro Abs: 5.7 10*3/uL (ref 1.4–7.7)
Neutrophils Relative %: 70.3 % (ref 43.0–77.0)
Platelets: 309 10*3/uL (ref 150.0–400.0)
RBC: 4.07 Mil/uL (ref 3.87–5.11)
RDW: 14.1 % (ref 11.5–15.5)
WBC: 8.2 10*3/uL (ref 4.0–10.5)

## 2022-11-04 LAB — TSH: TSH: 1.29 u[IU]/mL (ref 0.35–5.50)

## 2022-11-08 ENCOUNTER — Other Ambulatory Visit: Payer: Self-pay | Admitting: Neurology

## 2022-11-09 ENCOUNTER — Encounter: Payer: Self-pay | Admitting: Neurology

## 2022-11-09 ENCOUNTER — Ambulatory Visit (INDEPENDENT_AMBULATORY_CARE_PROVIDER_SITE_OTHER): Payer: Medicare Other | Admitting: Neurology

## 2022-11-09 VITALS — BP 147/67 | HR 63 | Ht 63.0 in | Wt 158.0 lb

## 2022-11-09 DIAGNOSIS — G309 Alzheimer's disease, unspecified: Secondary | ICD-10-CM

## 2022-11-09 DIAGNOSIS — F0280 Dementia in other diseases classified elsewhere without behavioral disturbance: Secondary | ICD-10-CM | POA: Diagnosis not present

## 2022-11-09 MED ORDER — RIVASTIGMINE TARTRATE 1.5 MG PO CAPS: 1.5000 mg | ORAL_CAPSULE | Freq: Two times a day (BID) | ORAL | 4 refills | Status: DC

## 2022-11-09 NOTE — Patient Instructions (Signed)
Rivastigmine max dose 6mg  twice daily  Rivastigmine Capsules What is this medication? RIVASTIGMINE (ri va STIG meen) treats memory loss and confusion (dementia) in people who have Alzheimer or Parkinson disease. It works by improving attention, memory, and the ability to engage in daily activities. This medicine may be used for other purposes; ask your health care provider or pharmacist if you have questions. COMMON BRAND NAME(S): Exelon What should I tell my care team before I take this medication? They need to know if you have any of these conditions: Difficulty passing urine Heart disease, or irregular or slow heartbeat Kidney disease Liver disease Lung or breathing disease, such as asthma Seizures Stomach or intestine disease, ulcers, or stomach bleeding An unusual or allergic reaction to rivastigmine, other medications, foods, dyes, or preservatives Pregnant or trying to get pregnant Breast-feeding How should I use this medication? Take this medication by mouth with a glass of water. Take it as directed on the prescription label at the same time every day. Take it with food. Keep taking it unless your care team tells you to stop. Talk to your care team about the use of this medication in children. Special care may be needed. Overdosage: If you think you have taken too much of this medicine contact a poison control center or emergency room at once. NOTE: This medicine is only for you. Do not share this medicine with others. What if I miss a dose? If you miss a dose, take it as soon as you can. If it is almost time for your next dose, take only that dose. Do not take double or extra doses. What may interact with this medication? Antihistamines for allergy, cough and cold Atropine Certain medications for bladder problems, such as oxybutynin, tolterodine Certain medications for Parkinson disease, such as benztropine, trihexyphenidyl Certain medications for stomach problems, such as  dicyclomine, hyoscyamine Certain medications for travel sickness, such as scopolamine Glycopyrrolate Ipratropium Medications that relax your muscles for surgery Other medications for Alzheimer disease This list may not describe all possible interactions. Give your health care provider a list of all the medicines, herbs, non-prescription drugs, or dietary supplements you use. Also tell them if you smoke, drink alcohol, or use illegal drugs. Some items may interact with your medicine. What should I watch for while using this medication? Visit your care team for regular checks on your progress. Tell your care team if your symptoms do not start to get better or if they get worse. This medication may affect your coordination, reaction time, or judgment. Do not drive or operate machinery until you know how this medication affects you. Sit up or stand slowly to reduce the risk of dizzy or fainting spells. Drinking alcohol with this medication can increase the risk of these side effects. What side effects may I notice from receiving this medication? Side effects that you should report to your care team as soon as possible: Allergic reactions--skin rash, itching, hives, swelling of the face, lips, tongue, or throat Seizures Slow heartbeat--dizziness, feeling faint or lightheaded, confusion, trouble breathing, unusual weakness or fatigue Uncontrolled and repetitive body movements, muscle stiffness or spasms, tremors or shaking, loss of balance or coordination, restlessness, shuffling walk, which may be signs of extrapyramidal symptoms (EPS) Side effects that usually do not require medical attention (report to your care team if they continue or are bothersome): Diarrhea Dizziness Loss of appetite with weight loss Nausea Vomiting This list may not describe all possible side effects. Call your doctor for medical advice  about side effects. You may report side effects to FDA at 1-800-FDA-1088. Where should I  keep my medication? Keep out of reach of children and pets. Store between 15 and 30 degrees C (59 and 86 degrees F). Keep the container tightly closed. Get rid of any unused medication after the expiration date. To get rid of medications that are no longer needed or have expired: Take the medication to a medication take-back program. Check with your pharmacy or law enforcement to find a location. If you cannot return the medication, check the label or package insert to see if the medication should be thrown out in the garbage or flushed down the toilet. If you are not sure, ask your care team. If it is safe to put it in the trash, pour the medication out of the container. Mix the medication with cat litter, dirt, coffee grounds, or other unwanted substance. Seal the mixture in a bag or container. Put it in the trash. NOTE: This sheet is a summary. It may not cover all possible information. If you have questions about this medicine, talk to your doctor, pharmacist, or health care provider.  2024 [employer] (2021-07-09 00:00:00)

## 2022-11-09 NOTE — Progress Notes (Signed)
[facility]    Provider:  Dr Lucia Gaskins Requesting Provider: Sheran Luz, MD Primary Care Provider:  Shelva Majestic, MD  CC:  Alzheimer's disease, neuropathy  11/09/2022: Any loud noise Is physically painful.  Patient is doing well.  She has caretakers 24 x 7. Taking memantine. She did not tolerate the aricept oral or patch. MMSE went down from 26 to 19 but she forgot her hearing aids. Daughters have not noticed any significant changes which is good. Still at [facility] and is on the list for assisted care. They have 3 aids and lots of social interaction. Wear hearing aids. Here with her 2 daughters. Discussed dementia, see assessment and plan for details. Happy. Gets exercise tries to go every day.  They  have an appt with a therapist. Dr. Durene Cal helped make them an appointments. Dr. Fredia Sorrow. Sleeping well. Not wandering. Some depression. Both daughters here and provide much information. No sleep apnea symptoms. No falls. Aids and I just I ate it and I just missed you to I know is not I yeah I know I know but she is x7. She is still social. No agitation, once in a while she may have a delusion like her cat is anxiousshe has great insightshe may get agitted if multiple people are talking or she doesn't know what they are talking about or not talking to her. No other focal neurologic deficits, associated symptoms, inciting events or modifiable factors.   Patient complains of symptoms per HPI as well as the following symptoms: dementia . Pertinent negatives and positives per HPI. All others negative   10/28/2021: This is an 89 year old patient who is here for follow-up after formal memory testing.  She was tested with Dr. Milbert Coulter and per his results patient meets diagnostic criteria for dementia, the underlying etiology most likely represents either an atypical Alzheimer's, presentation of Lewy body dementia without, behavioral symptoms are combination of  said etiologies.  He met with daughters and patient to review all his results.  Review of chart also showed thyroid in February 2023 was normal.  Of the brain in November 2022 was unremarkable.  She was extensively tested in the past for neuropathy which included B12 which was not deficient.  I did talk with her daughters last time about the healthy brain study, and gave dementia literature, discussed "the XX brain" by Chana Bode which is a good book for them to read about decreasing risks for dementia.  The mind diet and in the future getting an FDG PET scan if needed.  Step causes problems with patient's stool so it was stopped but Namenda was started. She had a bad UTI at the time. Taking a Tai Chi class and exercising and now more socializing and exercising. No sleep apnea symptoms. Her daughter was hit by a car and at the CT to check her out they saw a lung nodule that tuned out to be malignancy was removed, stage 1, cured. amazing story. Patient has some left calf pain, helps to stretch likely muscular due but if swelling can check for dvt, duscussed hydration.  MRI brain 04/01/2021: IMPRESSION:    MRI brain (with and without) demonstrating: - Mild atrophy and mild chronic small vessel ischemic disease. - No acute findings.  03/10/2021: 5 years ago she felt like she had a problem with playing cards and keeping score. She played it once a week. Here with daughter who provides much information. First thing she noticed, still to this day struggles  with dates and times are hard for her to keep straight. She feeds the cat every morning, 3 months ago she got up and couldn't remember how to feed the cat. She has to concentrate to do things that involve multiple steps. Dauhter notices quite a few things, she is forgetting things. She has been having back issues and had minor invasive back surgery. She had problems with the anesthesia for 3 weeks. Then she had a UTI. She is repeating things. She is feeling  better. She has a monitor in case she falls. She is having trouble using the phone, she wanted to go to happy hour and wanted to bring the landline phone with her. She got her cell and her land line mixed up but that was in the UTI phase.  Had an extended visit today with patient, discussed dementia, different kinds of dementia, versus mild cognitive impairment also the role that depression and anxiety or lifestyle and genetics, medication, management, microvascular ischemia and vascular dementia and answered all questions. No other focal neurologic deficits, associated symptoms, inciting events or modifiable factors.  CT head 02/12/2021: personally reviewed images and with patient and daughter  IMPRESSION: Atrophy, chronic microvascular disease.   No acute intracranial abnormality.    HPI:  FATEN FRIESON is a 89 y.o. female here as requested by  Sheran Luz, MD for numbness of lower limb.  Past medical history severe spinal stenosis L3-L4 and L4-L5, spondylolisthesis L4-L5, bilateral burning in the feet possibly peripheral neuropathy for which she has been requested to be seen here in neurology.  She also has a history of cardiac arrhythmia, hypertension, asthma, GERD, hypothyroidism, osteoarthritis, hyperlipidemia.  She is here with her daughter who also provides much information. 25 years ago she noticed the symptoms slowly the symptoms have crept up, within the last 2-3 months in bed she is gettng cramps and feet feel on fire. They burn. Slowly progressively worsening. The Gabapentin helps a lot. Symmetrical, it hurts severely, has cramps, Gabapentin helps, Topricin helped. 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 Cramps in the toes. Burning on the bottom on the feet from the toes to the heels. No weakness. No gaot abnormalities. No other focal neurologic deficits, associated symptoms, inciting events or modifiable factors.She is extremely organized and she takes her own  medications, daughters help. We discussed cameras and she doesn't love that, they all call and text. She has a calendar or she would forget about appointments. She doesn't use a scooter anymore. She is having trouble with numbers, she doesn't cook, she doesn't bake. When she was on the anesthesias she had elaborate hallucinations which she remembers in detail today. Father had dementia in his 93s, after he had strokes. Mother was sharp until her 78s.   MRI of the brain    I reviewed  Sheran Luz, MD's notes: She has a past medical history of degeneration of the lumbar intervertebral discs, spinal stenosis, postoperative care, pain in the right hip joint, bilateral carpal tunnel, low back pain.  She saw Dr. Horald Chestnut for bilateral leg weakness and burning in the bilateral feet the feet issue is the worst, she was started on gabapentin, and its helping, still with some low back pain, she has more than 1 problem including a crunching sensation in her neck but denies any radicular symptoms into her arms, 2% over-the-counter cream helped for about a month and it stopped working, gabapentin is giving her relief, she injured her head  and went to the emergency room not too long ago they did not diagnose her with any because of that, occurred prior to taking the gabapentin, she is complaining of burning sensation in her feet, she is very active but the pain in her feet is not helping her ability to do things, she would rather walk in her carpet at home rather than walking out in the community on a hard concrete, her daughter bought her a walker, she has not had any more recent falls, she did get a bruise over her right orbital region since she fell, negative straight leg raise on the right, negative cross her leg raise on the left, she has pretty good cervical range of motion, strength is 5 out of 5, she does have severe spinal stenosis L3-L4 and L4-L5 and spondylolisthesis L4-L5, here for evaluation of peripheral  neuropathy.  She also had an L5-S1 epidural steroid injection, she had good relief for 1 day, pain is in the back of her legs but also in the front of her legs and groin.  It appears she has had 3 L5-S1 epidural steroid injections.  Reviewed notes, labs and imaging from outside physicians, which showed:  MRI of the lumbar spine December 17, 2018 shows severe L3-L4 spinal canal stenosis, moderate to severe narrowing, moderate L4-L5 spinal canal stenosis and moderate to severe narrowing, moderate to severe right and mild left L5-S1 neural foraminal narrowing with mass-effect upon the right L5 nerve root, severe spinal stenosis at L3-L4 and L4-L5 with spondylosis at L4-L5. Reviewed images.  Review of Systems: Patient complains of symptoms per HPI as well as the following symptoms: memory loss . Pertinent negatives and positives per HPI. All others negative     Social History   Socioeconomic History   Marital status: Widowed    Spouse name: Not on file   Number of children: 3   Years of education: 22   Highest education level: Master's degree (e.g., MA, MS, MEng, MEd, MSW, MBA)  Occupational History   Occupation: Retired    Comment: PE Teacher  Tobacco Use   Smoking status: Never   Smokeless tobacco: Never  Vaping Use   Vaping Use: Never used  Substance and Sexual Activity   Alcohol use: Yes    Alcohol/week: 7.0 - 14.0 standard drinks of alcohol    Types: 7 - 14 Glasses of wine per week   Drug use: No   Sexual activity: Not Currently  Other Topics Concern   Not on file  Social History Narrative   Widowed around 2013. Lives alone with cat at [facility]. Has caregiver in the mornings and evenings     3 children- 2 daughters live close. 5 grandkids. 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 Was great athlete.       Hobbies: enjoys tv, wine      Update 11/09/2022   Lives in an apartment with a caregiver and other aids come and go  during the day   Social Determinants of Health   Financial Resource Strain: Low Risk  (05/10/2022)   Overall Financial Resource Strain (CARDIA)    Difficulty of Paying Living Expenses: Not hard at all  Food Insecurity: No Food Insecurity (05/10/2022)   Hunger Vital Sign    Worried About Running Out of Food in the Last Year: Never true    Ran Out of Food in the Last Year: Never true  Transportation Needs: No  Transportation Needs (05/10/2022)   PRAPARE - Administrator, Civil Service (Medical): No    Lack of Transportation (Non-Medical): No  Physical Activity: Sufficiently Active (05/10/2022)   Exercise Vital Sign    Days of Exercise per Week: 5 days    Minutes of Exercise per Session: 30 min  Stress: No Stress Concern Present (05/10/2022)   [employer] of Occupational Health - Occupational Stress Questionnaire    Feeling of Stress : Not at all  Social Connections: Socially Isolated (05/10/2022)   Social Connection and Isolation Panel [NHANES]    Frequency of Communication with Friends and Family: Three times a week    Frequency of Social Gatherings with Friends and Family: Three times a week    Attends Religious Services: Never    Active Member of Clubs or Organizations: No    Attends Banker Meetings: Never    Marital Status: Widowed  Intimate Partner Violence: Not At Risk (05/10/2022)   Humiliation, Afraid, Rape, and Kick questionnaire    Fear of Current or Ex-Partner: No    Emotionally Abused: No    Physically Abused: No    Sexually Abused: No    Family History  Problem Relation Age of Onset   Ovarian cancer Mother        in 43s   Heart disease Father        died at 89   Stroke Father    Allergies Father    Arthritis Father    Dementia Father    Cancer Sister        brain. 42 died   Healthy Sister    Other Brother        died at pneumonia young   Breast cancer Maternal Aunt    Lung cancer Daughter    Colon cancer Neg Hx     Alzheimer's disease Neg Hx     Past Medical History:  Diagnosis Date   Allergic rhinitis 05/04/2010   Asthma, exercise induced 08/18/2010    She uses the Proventil inhaler on rare occasions not on a daily basis    Benign neoplasm of colon 05/04/2010   Burning sensation of feet 06/21/2020   Chest tightness or pressure 05/13/2013   Chronic diarrhea 12/30/2020   Cystocele 10/02/2014   Essential hypertension 05/04/2010   GERD (gastroesophageal reflux disease) 05/04/2010   Pantoprazole 40- down to 20 for diarrhea    History of diverticulitis 05/04/2010   Hyperlipidemia 05/04/2010   Hypothyroidism 05/04/2010   Major depressive disorder    Menopausal disorder 08/18/2010   When she went off the HRT she had severe mood disorder    Mitral valve prolapse    Moderate dementia, without behavioral disturbance 09/07/2021   Neuropathy    Osteoarthritis 05/04/2010   Palpitations 06/30/2014   Pneumonia    S/P lumbar fusion 01/27/2021   Spinal stenosis     Patient Active Problem List   Diagnosis Date Noted   Leg pain 02/21/2022   Moderate dementia, without behavioral disturbance 09/07/2021   Mitral valve prolapse 09/06/2021   Neuropathy 09/06/2021   S/P lumbar fusion 01/27/2021   Chronic diarrhea 12/30/2020   Burning sensation of feet 06/21/2020   Cystocele 10/02/2014   Palpitations 06/30/2014   Moderate persistent asthma 08/18/2010   Benign neoplasm of colon 05/04/2010   Hypothyroidism 05/04/2010   Hyperlipidemia 05/04/2010   Essential hypertension 05/04/2010   Allergic rhinitis 05/04/2010   Asthma 05/04/2010   GERD (gastroesophageal reflux disease) 05/04/2010   Osteoarthritis 05/04/2010  History of diverticulitis 05/04/2010    Past Surgical History:  Procedure Laterality Date   ABDOMINAL HYSTERECTOMY     BACK SURGERY     CATARACT EXTRACTION Bilateral    cataract surgery     COLONOSCOPY     EYE SURGERY     LAMINECTOMY WITH POSTERIOR LATERAL ARTHRODESIS LEVEL 1 N/A  01/27/2021   Procedure: Laminectomy and Foraminotomy - Lumbar Three-Four/Lumbar Four-Five, posterior fusion with fixation Lumbar Four-Five;  Surgeon: Tia Alert, MD;  Location: [facility] OR;  Service: Neurosurgery;  Laterality: N/A;   SHOULDER SURGERY Right    TONSILLECTOMY     trigger thumb Right    TUBAL LIGATION     WISDOM TOOTH EXTRACTION     x4    Current Outpatient Medications  Medication Sig Dispense Refill   acetaminophen (TYLENOL) 500 MG tablet Take 1,000 mg by mouth every 6 (six) hours as needed.     albuterol (VENTOLIN HFA) 108 (90 Base) MCG/ACT inhaler INHALE TWO PUFFS BY MOUTH INTO LUNGS EVERY 6 HOURS AS NEEDED FOR WHEEZING/SHORTNESS OF BREATH 18 g 4   amLODipine (NORVASC) 5 MG tablet TAKE ONE TABLET BY MOUTH TWICE A DAY 180 tablet 3   busPIRone (BUSPAR) 5 MG tablet TAKE ONE TABLET BY MOUTH TWICE A DAY AS NEEDED 60 tablet 5   calcitonin, salmon, (MIACALCIN) 200 UNIT/ACT nasal spray Place 1 spray into alternate nostrils daily. For 1 month 3.7 mL 1   Cholecalciferol (VITAMIN D-3) 25 MCG (1000 UT) CAPS Take 1,000 Units by mouth daily.     citalopram (CELEXA) 10 MG tablet Take 1 tablet (10 mg total) by mouth daily. 90 tablet 3   EPINEPHrine 0.3 mg/0.3 mL IJ SOAJ injection Inject 0.3 mg into the muscle as needed for anaphylaxis. 1 each 1   fluticasone (FLONASE) 50 MCG/ACT nasal spray Place 1 spray into both nostrils daily. 16 g 3   gabapentin (NEURONTIN) 300 MG capsule TAKE UP TO 4 CAPSULES AT BEDTIME FOR CRAMPS 120 capsule 1   levothyroxine (SYNTHROID) 25 MCG tablet TAKE ONE TABLET BY MOUTH DAILY BEFORE BREAKFAST 90 tablet 3   meclizine (ANTIVERT) 12.5 MG tablet Take 1 tablet (12.5 mg total) by mouth 3 (three) times daily as needed for dizziness. 30 tablet 0   memantine (NAMENDA) 10 MG tablet Take 1 tablet (10 mg total) by mouth 2 (two) times daily. 180 tablet 11   METAMUCIL FIBER PO Take by mouth.     metoprolol tartrate (LOPRESSOR) 25 MG tablet TAKE TWO TABLETS BY MOUTH EVERY  MORNING AND TAKE ONE TABLET BY MOUTH EVERY EVENING 270 tablet 3   ondansetron (ZOFRAN-ODT) 4 MG disintegrating tablet Take 1 tablet (4 mg total) by mouth every 8 (eight) hours as needed for nausea or vomiting. 20 tablet 0   pantoprazole (PROTONIX) 20 MG tablet Take 1 tablet (20 mg total) by mouth daily. 90 tablet 3   Probiotic Product (ALIGN) 4 MG CAPS Take 4 mg by mouth daily.     Pyridoxine HCl (VITAMIN B-6 PO) Take 1 tablet by mouth daily.     rivastigmine (EXELON) 1.5 MG capsule Take 1 capsule (1.5 mg total) by mouth 2 (two) times daily. 180 capsule 4   traMADol (ULTRAM) 50 MG tablet Take 1 tablet (50 mg total) by mouth every 8 (eight) hours as needed for moderate pain or severe pain (of the back). 30 tablet 1   vitamin B-12 (CYANOCOBALAMIN) 500 MCG tablet Take 500 mcg by mouth 2 (two) times daily.  No current facility-administered medications for this visit.    Allergies as of 11/09/2022 - Review Complete 11/09/2022  Allergen Reaction Noted   Other Anaphylaxis, Shortness Of Breath, Swelling, and Rash 08/19/2019   Peanut-containing drug products Anaphylaxis, Shortness Of Breath, Swelling, and Rash 06/21/2011   Pecan nut (diagnostic) Anaphylaxis, Shortness Of Breath, and Rash 08/19/2019   Penicillins Swelling 05/04/2010    Vitals: BP (!) 147/67 (BP Location: Left Arm, Patient Position: Sitting)   Pulse 63   Ht 5\' 3"  (1.6 m)   Wt 158 lb (71.7 kg)   BMI 27.99 kg/m  Last Weight:  Wt Readings from Last 1 Encounters:  11/09/22 158 lb (71.7 kg)   Last Height:   Ht Readings from Last 1 Encounters:  11/09/22 5\' 3"  (1.6 m)  Exam: NAD, pleasant                  Speech:    Speech is normal; fluent and spontaneous with normal comprehension.  Cognition:     11/09/2022    3:19 PM 05/04/2022    1:16 PM 03/10/2021    2:17 PM  MMSE - Mini Mental State Exam  Orientation to time 2 4 5   Orientation to Place 1 1 5   Registration 3 3 3   Attention/ Calculation 0 1 1  Recall 2 3 3    Language- name 2 objects 1 2 2   Language- repeat 0 1 0  Language- follow 3 step command 3 3 3   Language- read & follow direction 1 1 1   Write a sentence 1 0 1  Copy design 0 0 0  Total score 14 19 24        The patient is oriented to person, place, and time;     recent and remote memory intact;     language fluent;    Cranial Nerves:    The pupils are equal, round, and reactive to light.Trigeminal sensation is intact and the muscles of mastication are normal. The face is symmetric. The palate elevates in the midline. Hearing intact. Voice is normal. Shoulder shrug is normal. The tongue has normal motion without fasciculations.   Coordination:  No dysmetria  Motor Observation:    No asymmetry, no atrophy, and no involuntary movements noted. Tone:    Normal muscle tone.     Strength:    Strength is V/V in the upper and lower limbs.      Sensation: intact to LT     Assessment/Plan:   89 y.o. female here for dementia  Past medical history severe spinal stenosis L3-L4 and L4-L5, spondylolisthesis L4-L5, peripheral neuropathy.MMSE 24/30. Had an extended visit today with patient, discussed dementia, different kinds of dementia, versus mild cognitive impairment also the role that depression and anxiety or lifestyle and genetics, medication, management, microvascular ischemia and vascular dementia and answered all questions.   - MRI of the brain w/wo contrast for reversible causes of dementia - Mild atrophy and mild chronic small vessel ischemic disease. - Formal neurocognitive testing - Diagnosed with Major neurocognitive disorder, likely Alzheimer's today declined MMSE to 14 - continue namenda. Could not tolerate aricept PO, patch wouldn't stay on, try rivastigmine. Increase today and daughters can slowly increase to 6mg  bid. - Healthy Brain study at [facility] - Consider joining for daughters - they have formal neurocognitive testing with Dr, Kieth Brightly, they will keep me informed -  The XX Brain by Chana Bode - Great book, recommend reading to daughters - MIND diet from [employer] - Diet to  help prevent Alzheimer's disease, discussed and recommended - Consider a baseline Neurocognitive test: Dr. Kieth Brightly [facility] at [facility], for daughters - Cramping in feet of patient: Fluids, vit K bananas, Magnesium (mag citrate, mag oxide, mag glycinate) OTC low dose, Club soda with quinine, stretching with PT, can come back to discuss in detail if needed - XR wrist fell and has point tenderness[address] [employer] - walk in basis Up to 4 Gabapentin at bedtime for cramping in feet She is declining but doing well. Has a therapist. Is irritable but redirectable. Could try increasing SSRI. - follow up as neded  Meds ordered this encounter  Medications   rivastigmine (EXELON) 1.5 MG capsule    Sig: Take 1 capsule (1.5 mg total) by mouth 2 (two) times daily.    Dispense:  180 capsule    Refill:  4    Prior Assessment and plan 06/19/2020:  Discussed the causes of peripheral neuropathy, the most common being diabetes which patient does not report having. About 20 million people in the [place] states have some form of peripheral neuropathy. This is a condition that develops as a result of damage to the peripheral nervous system. Given symptoms and exam suspect a symmetric length dependent neuropathy probably small fiber axonal.. There are multiple causes eluding metabolic, toxic, infectious and endocrine disorders, small vessel disease, autoimmune diseases, and others. We'll perform extensive serum neuropathy screening. Continue Gabapentin which is helping tremendously. May also consider podiatry, heel pain may be plantar fasciitis or other concomitant structural problem contributing to symptoms.    Cc: Shelva Majestic, MD,  Shelva Majestic, MD,   Naomie Dean, MD  [facility] [address] [place], [place] [address]  Phone [phone] Fax  [phone]  I spent over 30 minutes of face-to-face and non-face-to-face time with patient on the  1. Alzheimer disease (HCC)        diagnosis.  This included previsit chart review, lab review, study review, order entry, electronic health record documentation, patient education on the different diagnostic and therapeutic options, counseling and coordination of care, risks and benefits of management, compliance, or risk factor reduction

## 2022-11-14 ENCOUNTER — Encounter: Payer: Self-pay | Admitting: Family Medicine

## 2022-11-24 ENCOUNTER — Ambulatory Visit (INDEPENDENT_AMBULATORY_CARE_PROVIDER_SITE_OTHER): Payer: Medicare Other | Admitting: Behavioral Health

## 2022-11-24 DIAGNOSIS — F4323 Adjustment disorder with mixed anxiety and depressed mood: Secondary | ICD-10-CM

## 2022-11-24 DIAGNOSIS — F03B0 Unspecified dementia, moderate, without behavioral disturbance, psychotic disturbance, mood disturbance, and anxiety: Secondary | ICD-10-CM

## 2022-11-24 NOTE — Progress Notes (Signed)
                Sara Cutrona L [place] Festa, LMFT 

## 2022-11-30 NOTE — Progress Notes (Signed)
[facility] Behavioral Health Counselor Initial Adult Exam  Name: Sara Medina Date: 11/30/2022 MRN: [id number] DOB: 1929-10-30 PCP: Sara Majestic, MD  Time spent: 60 min In Person @ [facility] Office  Guardian/Payee:  Medicare Part A & B    Paperwork requested: No   Reason for Visit /Presenting Problem: Pt is 89yo Dx'd w/Alzheimer's one year ago. She presents w/her Sara Medina today for someone to talk to & process her feelings for health status changes & death of Husb Sara Medina yrs ago.   Pt exp'd no Sx the first 6 mos post Dx & now having retrieval difficulties, forgetfulness, & retention of what she reads is challenging if not non-existent. Pt has a great sense of humor!  Mental Status Exam: Appearance:   Casual     Behavior:  Appropriate and Sharing  Motor:  Normal  Speech/Language:   Clear and Coherent  Affect:  Appropriate  Mood:  normal  Thought process:  Dx of Alzheimer's in June 2023  Thought content:    Rumination  Sensory/Perceptual disturbances:    WNL  Orientation:  oriented to person, place, time/date, and situation  Attention:  Good  Concentration:  Good  Memory:  Dx of Alzheimer's in June 2023  Fund of knowledge:   Good  Insight:    Fair  Judgment:   Good  Impulse Control:  Good    Risk Assessment: Danger to Self:  No Self-injurious Behavior: No Danger to Others: No Duty to Warn:no Physical Aggression / Violence:No  Access to Firearms a concern: No  Gang Involvement:No  Patient / guardian was educated about steps to take if suicide or homicide risk level increases between visits: yes; appropriate to ICD process While future psychiatric events cannot be accurately predicted, the patient does not currently require acute inpatient psychiatric care and does not currently meet [facility] involuntary commitment criteria.  Substance Abuse History: Current substance abuse: No     Past Psychiatric History:   No previous psychological problems have been  observed Outpatient Providers:Dr. Tana Conch, MD History of Psych Hospitalization: No  Psychological Testing:  NA  Pt has f/u Neuropsych Appt soon  Abuse History:  Victim of: No.,  NA    Report needed: No. Victim of Neglect:No. Perpetrator of  NA   Witness / Exposure to Domestic Violence: No   Protective Services Involvement: No  Witness to [employer] Violence:  No   Family History:  Family History  Problem Relation Age of Onset   Ovarian cancer Mother        in 46s   Heart disease Father        died at 48   Stroke Father    Allergies Father    Arthritis Father    Dementia Father    Cancer Sister        brain. 36 died   Healthy Sister    Other Brother        died at pneumonia young   Breast cancer Maternal Aunt    Lung cancer Daughter    Colon cancer Neg Hx    Alzheimer's disease Neg Hx     Living situation: the patient lives with an adult companion who is female Sara Reining) & lives in other part of the [place]. Pt has Aides daily. 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 & a BF), & 72yo Sara Medina  Support Systems: friends Adult Children who care for her & female friend Sara Medina Stress:  No   Income/Employment/Disability: Lawyer: No   Educational History: Education: post Engineer, maintenance (IT) work or degree; 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 She has gained substantial weight in the last yr which bothers her.   Stressors: Health problems   Loss of some independence & driving ability    Strengths: Supportive Relationships, Family, Friends, Journalist, newspaper, Able to Communicate Effectively, and great sense of Humor!!  Barriers:  None noted today   Legal History: Pending legal issue / charges: The patient has no significant history of legal issues. History of legal issue / charges:  NA  Medical History/Surgical History: reviewed Past Medical History:  Diagnosis Date   Allergic rhinitis 05/04/2010   Asthma, exercise induced 08/18/2010    She uses the Proventil inhaler on rare occasions not on a daily basis    Benign neoplasm of colon 05/04/2010   Burning sensation of feet 06/21/2020   Chest tightness or pressure 05/13/2013   Chronic diarrhea 12/30/2020   Cystocele 10/02/2014   Essential hypertension 05/04/2010   GERD (gastroesophageal reflux disease) 05/04/2010   Pantoprazole 40- down to 20 for diarrhea    History of diverticulitis 05/04/2010   Hyperlipidemia 05/04/2010   Hypothyroidism 05/04/2010   Major depressive disorder    Menopausal disorder 08/18/2010   When she went off the HRT she had severe mood disorder    Mitral valve prolapse    Moderate dementia, without behavioral disturbance 09/07/2021   Neuropathy    Osteoarthritis 05/04/2010   Palpitations 06/30/2014   Pneumonia    S/P lumbar fusion 01/27/2021   Spinal stenosis     Past Surgical History:  Procedure Laterality Date   ABDOMINAL HYSTERECTOMY     BACK SURGERY     CATARACT EXTRACTION Bilateral    cataract surgery     COLONOSCOPY     EYE SURGERY     LAMINECTOMY WITH POSTERIOR LATERAL ARTHRODESIS LEVEL 1 N/A 01/27/2021   Procedure: Laminectomy and Foraminotomy - Lumbar Three-Four/Lumbar Four-Five, posterior fusion with fixation Lumbar Four-Five;  Surgeon: Tia Alert, MD;  Location: [facility] OR;   Service: Neurosurgery;  Laterality: N/A;   SHOULDER SURGERY Right    TONSILLECTOMY     trigger thumb Right    TUBAL LIGATION     WISDOM TOOTH EXTRACTION     x4    Medications: Current Outpatient Medications  Medication Sig Dispense Refill   acetaminophen (TYLENOL) 500 MG tablet Take 1,000 mg by mouth every 6 (six) hours as needed.     albuterol (VENTOLIN HFA) 108 (90 Base) MCG/ACT inhaler INHALE TWO PUFFS BY MOUTH INTO LUNGS EVERY 6 HOURS AS NEEDED FOR WHEEZING/SHORTNESS OF BREATH 18 g 4   amLODipine (NORVASC) 5 MG tablet TAKE ONE TABLET BY MOUTH TWICE A DAY 180 tablet 3   busPIRone (BUSPAR) 5 MG tablet  TAKE ONE TABLET BY MOUTH TWICE A DAY AS NEEDED 60 tablet 5   calcitonin, salmon, (MIACALCIN) 200 UNIT/ACT nasal spray Place 1 spray into alternate nostrils daily. For 1 month 3.7 mL 1   Cholecalciferol (VITAMIN D-3) 25 MCG (1000 UT) CAPS Take 1,000 Units by mouth daily.     citalopram (CELEXA) 10 MG tablet Take 1 tablet (10 mg total) by mouth daily. 90 tablet 3   EPINEPHrine 0.3 mg/0.3 mL IJ SOAJ injection Inject 0.3 mg into the muscle as needed for anaphylaxis. 1 each 1   fluticasone (FLONASE) 50 MCG/ACT nasal spray Place 1 spray into both nostrils daily. 16 g 3   gabapentin (NEURONTIN) 300 MG capsule TAKE UP TO 4 CAPSULES AT BEDTIME FOR CRAMPS 120 capsule 1   levothyroxine (SYNTHROID) 25 MCG tablet TAKE ONE TABLET BY MOUTH DAILY BEFORE BREAKFAST 90 tablet 3   meclizine (ANTIVERT) 12.5 MG tablet Take 1 tablet (12.5 mg total) by mouth 3 (three) times daily as needed for dizziness. 30 tablet 0   memantine (NAMENDA) 10 MG tablet Take 1 tablet (10 mg total) by mouth 2 (two) times daily. 180 tablet 11   METAMUCIL FIBER PO Take by mouth.     metoprolol tartrate (LOPRESSOR) 25 MG tablet TAKE TWO TABLETS BY MOUTH EVERY MORNING AND TAKE ONE TABLET BY MOUTH EVERY EVENING 270 tablet 3   ondansetron (ZOFRAN-ODT) 4 MG disintegrating tablet Take 1 tablet (4 mg total) by mouth every 8 (eight) hours as  needed for nausea or vomiting. 20 tablet 0   pantoprazole (PROTONIX) 20 MG tablet Take 1 tablet (20 mg total) by mouth daily. 90 tablet 3   Probiotic Product (ALIGN) 4 MG CAPS Take 4 mg by mouth daily.     Pyridoxine HCl (VITAMIN B-6 PO) Take 1 tablet by mouth daily.     rivastigmine (EXELON) 1.5 MG capsule Take 1 capsule (1.5 mg total) by mouth 2 (two) times daily. 180 capsule 4   traMADol (ULTRAM) 50 MG tablet Take 1 tablet (50 mg total) by mouth every 8 (eight) hours as needed for moderate pain or severe pain (of the back). 30 tablet 1   vitamin B-12 (CYANOCOBALAMIN) 500 MCG tablet Take 500 mcg by mouth 2 (two) times daily.     No current facility-administered medications for this visit.    Allergies  Allergen Reactions   Other Anaphylaxis, Shortness Of Breath, Swelling and Rash    ALL NUTS!!!!   Peanut-Containing Drug Products Anaphylaxis, Shortness Of Breath, Swelling and Rash   Pecan Nut (Diagnostic) Anaphylaxis, Shortness Of Breath and Rash   Penicillins Swelling    Full facial swelling Did it involve swelling of the face/tongue/throat, SOB, or low BP? Yes Did it involve sudden or severe rash/hives, skin peeling, or any reaction on the inside of your mouth or nose? Yes Did you need to seek medical attention at a hospital or doctor's office? No When did it last happen? "many years ago" If all above answers are "NO", may proceed with cephalosporin use.     Diagnoses:  Moderate dementia, without behavioral disturbance  Adjustment reaction with anxiety and depression  Plan of Care: Samica will attend all sessions as scheduled & feel free to have any of her children join her in session. She will implement brain exercises as suggested & she will make efforts to keep a Notebook in which she can make pertinent entries regarding her angst dealing w/her Dx & any feelings & thoughts that occur to her in daily living.  Target Date: 01/12/2023  Progress: 3  Frequency: Once every 3-4  wks  Modality: Claretta Fraise, LMFT

## 2022-12-05 DIAGNOSIS — R2689 Other abnormalities of gait and mobility: Secondary | ICD-10-CM | POA: Diagnosis not present

## 2022-12-05 DIAGNOSIS — M5459 Other low back pain: Secondary | ICD-10-CM | POA: Diagnosis not present

## 2022-12-06 ENCOUNTER — Other Ambulatory Visit: Payer: Self-pay | Admitting: Neurology

## 2022-12-08 ENCOUNTER — Other Ambulatory Visit: Payer: Self-pay | Admitting: Family Medicine

## 2022-12-08 DIAGNOSIS — M5459 Other low back pain: Secondary | ICD-10-CM | POA: Diagnosis not present

## 2022-12-08 DIAGNOSIS — R2689 Other abnormalities of gait and mobility: Secondary | ICD-10-CM | POA: Diagnosis not present

## 2022-12-12 DIAGNOSIS — R2689 Other abnormalities of gait and mobility: Secondary | ICD-10-CM | POA: Diagnosis not present

## 2022-12-12 DIAGNOSIS — M5459 Other low back pain: Secondary | ICD-10-CM | POA: Diagnosis not present

## 2022-12-13 DIAGNOSIS — H43813 Vitreous degeneration, bilateral: Secondary | ICD-10-CM | POA: Diagnosis not present

## 2022-12-14 ENCOUNTER — Ambulatory Visit (INDEPENDENT_AMBULATORY_CARE_PROVIDER_SITE_OTHER): Payer: Medicare Other | Admitting: Behavioral Health

## 2022-12-14 DIAGNOSIS — F4323 Adjustment disorder with mixed anxiety and depressed mood: Secondary | ICD-10-CM

## 2022-12-14 DIAGNOSIS — F03B0 Unspecified dementia, moderate, without behavioral disturbance, psychotic disturbance, mood disturbance, and anxiety: Secondary | ICD-10-CM

## 2022-12-14 NOTE — Progress Notes (Signed)
                Victoria L Winstead, LMFT 

## 2022-12-19 DIAGNOSIS — R2689 Other abnormalities of gait and mobility: Secondary | ICD-10-CM | POA: Diagnosis not present

## 2022-12-19 DIAGNOSIS — M5459 Other low back pain: Secondary | ICD-10-CM | POA: Diagnosis not present

## 2022-12-22 DIAGNOSIS — R2689 Other abnormalities of gait and mobility: Secondary | ICD-10-CM | POA: Diagnosis not present

## 2022-12-22 DIAGNOSIS — M5459 Other low back pain: Secondary | ICD-10-CM | POA: Diagnosis not present

## 2022-12-26 DIAGNOSIS — R2689 Other abnormalities of gait and mobility: Secondary | ICD-10-CM | POA: Diagnosis not present

## 2022-12-26 DIAGNOSIS — M5459 Other low back pain: Secondary | ICD-10-CM | POA: Diagnosis not present

## 2022-12-26 NOTE — Progress Notes (Signed)
[facility] Counselor/Therapist Progress Note  Patient ID: Sara Medina, MRN: [id number],    Date: 12/26/2022  Time Spent: 55 min In person @ [facility]   Treatment Type: Family with patient; Pt was in Indiv for ~ 20 min & then both Dtrs present today for 30 min.  Reported Symptoms: Elevated anx/dep & stress when she cannot fllw the discussion or when Dtrs try to gently confront re: beh that is difficult when they are Caregivers.   Mental Status Exam: Appearance:  Casual     Behavior: Appropriate, Sharing, and Pt status w/Dx of Alzheimer's for one year  Motor: Normal  Speech/Language:  Clear and Coherent and Normal Rate  Affect: Appropriate; confusion @ times when she does not recognize what Dtr's are trying to convey  Mood: normal  Thought process: normal  Thought content:   WNL  Sensory/Perceptual disturbances:   WNL  Orientation: oriented to person, place, time/date, and situation  Attention: Good  Concentration: Good  Memory: Mild Cog Impairment assoc'd w/Alzheimer's  Fund of knowledge:  Good  Insight:   Fair  Judgment:  Good  Impulse Control: Fair   Risk Assessment: Danger to Self:  No Self-injurious Behavior: No Danger to Others: No Duty to Warn:no Physical Aggression / Violence:No  Access to Firearms a concern: No  Gang Involvement:No   Subjective: Pt has maintained her cognitive impairment w/minimal changes. When conversation is stressful, some deterioration occurs & is noted.   Interventions: Psycho-education/Bibliotherapy and Family Systems  Diagnosis:Moderate dementia, without behavioral disturbance  Adjustment reaction with anxiety and depression  Plan: Pt appears to feel & report things are going fine. She has Caregivers that irritate her, but she tolerates them if they are friendly & acknowledge her. Dtrs report difficulty w/Pt's beh if they fail to speak to Pt first. Determined the importance of this interaction so Pt does not feel  dismissed. Pt reports feeling pressured by Dtr & she is doing her best. Dtrs will make extra effort to communicate w/Mother, understand her feelings of "being invisible" & their need to keep her upset to a minimal. She will use her Notebook as able to record these thoughts, feelings, & anxieties.   Target Date: 01/27/2023  Progress: 4  Frequency: Once every 3-4 wks  Modality: Indiv & Family  Deneise Lever, LMFT

## 2022-12-29 DIAGNOSIS — M5459 Other low back pain: Secondary | ICD-10-CM | POA: Diagnosis not present

## 2022-12-29 DIAGNOSIS — R2689 Other abnormalities of gait and mobility: Secondary | ICD-10-CM | POA: Diagnosis not present

## 2023-01-09 ENCOUNTER — Other Ambulatory Visit: Payer: Self-pay | Admitting: Neurology

## 2023-01-09 DIAGNOSIS — R2689 Other abnormalities of gait and mobility: Secondary | ICD-10-CM | POA: Diagnosis not present

## 2023-01-09 DIAGNOSIS — M5459 Other low back pain: Secondary | ICD-10-CM | POA: Diagnosis not present

## 2023-01-12 DIAGNOSIS — R2689 Other abnormalities of gait and mobility: Secondary | ICD-10-CM | POA: Diagnosis not present

## 2023-01-12 DIAGNOSIS — M5459 Other low back pain: Secondary | ICD-10-CM | POA: Diagnosis not present

## 2023-01-16 DIAGNOSIS — M5459 Other low back pain: Secondary | ICD-10-CM | POA: Diagnosis not present

## 2023-01-16 DIAGNOSIS — R2689 Other abnormalities of gait and mobility: Secondary | ICD-10-CM | POA: Diagnosis not present

## 2023-01-19 DIAGNOSIS — M5459 Other low back pain: Secondary | ICD-10-CM | POA: Diagnosis not present

## 2023-01-19 DIAGNOSIS — R2689 Other abnormalities of gait and mobility: Secondary | ICD-10-CM | POA: Diagnosis not present

## 2023-01-24 DIAGNOSIS — M5459 Other low back pain: Secondary | ICD-10-CM | POA: Diagnosis not present

## 2023-01-24 DIAGNOSIS — R2689 Other abnormalities of gait and mobility: Secondary | ICD-10-CM | POA: Diagnosis not present

## 2023-01-26 DIAGNOSIS — R2689 Other abnormalities of gait and mobility: Secondary | ICD-10-CM | POA: Diagnosis not present

## 2023-01-26 DIAGNOSIS — M5459 Other low back pain: Secondary | ICD-10-CM | POA: Diagnosis not present

## 2023-01-31 DIAGNOSIS — R2689 Other abnormalities of gait and mobility: Secondary | ICD-10-CM | POA: Diagnosis not present

## 2023-01-31 DIAGNOSIS — M5459 Other low back pain: Secondary | ICD-10-CM | POA: Diagnosis not present

## 2023-02-02 DIAGNOSIS — R2689 Other abnormalities of gait and mobility: Secondary | ICD-10-CM | POA: Diagnosis not present

## 2023-02-02 DIAGNOSIS — M5459 Other low back pain: Secondary | ICD-10-CM | POA: Diagnosis not present

## 2023-02-06 ENCOUNTER — Other Ambulatory Visit: Payer: Self-pay | Admitting: Family

## 2023-02-06 DIAGNOSIS — M5459 Other low back pain: Secondary | ICD-10-CM | POA: Diagnosis not present

## 2023-02-06 DIAGNOSIS — R42 Dizziness and giddiness: Secondary | ICD-10-CM

## 2023-02-06 DIAGNOSIS — R2689 Other abnormalities of gait and mobility: Secondary | ICD-10-CM | POA: Diagnosis not present

## 2023-02-09 DIAGNOSIS — M5459 Other low back pain: Secondary | ICD-10-CM | POA: Diagnosis not present

## 2023-02-09 DIAGNOSIS — R2689 Other abnormalities of gait and mobility: Secondary | ICD-10-CM | POA: Diagnosis not present

## 2023-02-13 DIAGNOSIS — R2689 Other abnormalities of gait and mobility: Secondary | ICD-10-CM | POA: Diagnosis not present

## 2023-02-13 DIAGNOSIS — M5459 Other low back pain: Secondary | ICD-10-CM | POA: Diagnosis not present

## 2023-02-16 DIAGNOSIS — M5459 Other low back pain: Secondary | ICD-10-CM | POA: Diagnosis not present

## 2023-02-16 DIAGNOSIS — R2689 Other abnormalities of gait and mobility: Secondary | ICD-10-CM | POA: Diagnosis not present

## 2023-02-19 DIAGNOSIS — Z23 Encounter for immunization: Secondary | ICD-10-CM | POA: Diagnosis not present

## 2023-02-20 DIAGNOSIS — M5459 Other low back pain: Secondary | ICD-10-CM | POA: Diagnosis not present

## 2023-02-20 DIAGNOSIS — R2689 Other abnormalities of gait and mobility: Secondary | ICD-10-CM | POA: Diagnosis not present

## 2023-02-23 DIAGNOSIS — R2689 Other abnormalities of gait and mobility: Secondary | ICD-10-CM | POA: Diagnosis not present

## 2023-02-23 DIAGNOSIS — M5459 Other low back pain: Secondary | ICD-10-CM | POA: Diagnosis not present

## 2023-02-27 DIAGNOSIS — R2689 Other abnormalities of gait and mobility: Secondary | ICD-10-CM | POA: Diagnosis not present

## 2023-02-27 DIAGNOSIS — M5459 Other low back pain: Secondary | ICD-10-CM | POA: Diagnosis not present

## 2023-03-02 DIAGNOSIS — R2689 Other abnormalities of gait and mobility: Secondary | ICD-10-CM | POA: Diagnosis not present

## 2023-03-02 DIAGNOSIS — M5459 Other low back pain: Secondary | ICD-10-CM | POA: Diagnosis not present

## 2023-03-02 DIAGNOSIS — R41841 Cognitive communication deficit: Secondary | ICD-10-CM | POA: Diagnosis not present

## 2023-03-07 DIAGNOSIS — H903 Sensorineural hearing loss, bilateral: Secondary | ICD-10-CM | POA: Diagnosis not present

## 2023-03-08 ENCOUNTER — Other Ambulatory Visit: Payer: Self-pay | Admitting: Family Medicine

## 2023-03-09 ENCOUNTER — Encounter: Payer: Self-pay | Admitting: Family Medicine

## 2023-03-09 ENCOUNTER — Encounter: Payer: Self-pay | Admitting: Neurology

## 2023-03-12 ENCOUNTER — Other Ambulatory Visit: Payer: Self-pay | Admitting: Neurology

## 2023-03-23 ENCOUNTER — Other Ambulatory Visit: Payer: Self-pay | Admitting: Family Medicine

## 2023-03-29 DIAGNOSIS — M5459 Other low back pain: Secondary | ICD-10-CM | POA: Diagnosis not present

## 2023-03-29 DIAGNOSIS — R2689 Other abnormalities of gait and mobility: Secondary | ICD-10-CM | POA: Diagnosis not present

## 2023-03-29 DIAGNOSIS — R41841 Cognitive communication deficit: Secondary | ICD-10-CM | POA: Diagnosis not present

## 2023-04-03 DIAGNOSIS — F4321 Adjustment disorder with depressed mood: Secondary | ICD-10-CM | POA: Diagnosis not present

## 2023-04-05 DIAGNOSIS — R41841 Cognitive communication deficit: Secondary | ICD-10-CM | POA: Diagnosis not present

## 2023-04-05 DIAGNOSIS — R2681 Unsteadiness on feet: Secondary | ICD-10-CM | POA: Diagnosis not present

## 2023-04-05 DIAGNOSIS — M542 Cervicalgia: Secondary | ICD-10-CM | POA: Diagnosis not present

## 2023-04-05 DIAGNOSIS — M5459 Other low back pain: Secondary | ICD-10-CM | POA: Diagnosis not present

## 2023-04-12 DIAGNOSIS — R2681 Unsteadiness on feet: Secondary | ICD-10-CM | POA: Diagnosis not present

## 2023-04-12 DIAGNOSIS — M5459 Other low back pain: Secondary | ICD-10-CM | POA: Diagnosis not present

## 2023-04-12 DIAGNOSIS — R41841 Cognitive communication deficit: Secondary | ICD-10-CM | POA: Diagnosis not present

## 2023-04-12 DIAGNOSIS — M542 Cervicalgia: Secondary | ICD-10-CM | POA: Diagnosis not present

## 2023-04-13 ENCOUNTER — Encounter: Payer: Self-pay | Admitting: Family Medicine

## 2023-04-20 DIAGNOSIS — F4321 Adjustment disorder with depressed mood: Secondary | ICD-10-CM | POA: Diagnosis not present

## 2023-04-25 DIAGNOSIS — R2681 Unsteadiness on feet: Secondary | ICD-10-CM | POA: Diagnosis not present

## 2023-04-25 DIAGNOSIS — M5459 Other low back pain: Secondary | ICD-10-CM | POA: Diagnosis not present

## 2023-04-25 DIAGNOSIS — R41841 Cognitive communication deficit: Secondary | ICD-10-CM | POA: Diagnosis not present

## 2023-04-25 DIAGNOSIS — M542 Cervicalgia: Secondary | ICD-10-CM | POA: Diagnosis not present

## 2023-04-26 DIAGNOSIS — R2681 Unsteadiness on feet: Secondary | ICD-10-CM | POA: Diagnosis not present

## 2023-04-26 DIAGNOSIS — R41841 Cognitive communication deficit: Secondary | ICD-10-CM | POA: Diagnosis not present

## 2023-04-26 DIAGNOSIS — M542 Cervicalgia: Secondary | ICD-10-CM | POA: Diagnosis not present

## 2023-04-26 DIAGNOSIS — M5459 Other low back pain: Secondary | ICD-10-CM | POA: Diagnosis not present

## 2023-05-01 DIAGNOSIS — R41841 Cognitive communication deficit: Secondary | ICD-10-CM | POA: Diagnosis not present

## 2023-05-01 DIAGNOSIS — R2681 Unsteadiness on feet: Secondary | ICD-10-CM | POA: Diagnosis not present

## 2023-05-01 DIAGNOSIS — M542 Cervicalgia: Secondary | ICD-10-CM | POA: Diagnosis not present

## 2023-05-01 DIAGNOSIS — M5459 Other low back pain: Secondary | ICD-10-CM | POA: Diagnosis not present

## 2023-05-03 DIAGNOSIS — R2681 Unsteadiness on feet: Secondary | ICD-10-CM | POA: Diagnosis not present

## 2023-05-03 DIAGNOSIS — M542 Cervicalgia: Secondary | ICD-10-CM | POA: Diagnosis not present

## 2023-05-03 DIAGNOSIS — M5459 Other low back pain: Secondary | ICD-10-CM | POA: Diagnosis not present

## 2023-05-03 DIAGNOSIS — R41841 Cognitive communication deficit: Secondary | ICD-10-CM | POA: Diagnosis not present

## 2023-05-05 ENCOUNTER — Ambulatory Visit (INDEPENDENT_AMBULATORY_CARE_PROVIDER_SITE_OTHER): Payer: Medicare Other | Admitting: Family Medicine

## 2023-05-05 VITALS — BP 122/74 | HR 65 | Temp 97.2°F | Ht 63.0 in | Wt 155.2 lb

## 2023-05-05 DIAGNOSIS — E039 Hypothyroidism, unspecified: Secondary | ICD-10-CM | POA: Diagnosis not present

## 2023-05-05 DIAGNOSIS — K219 Gastro-esophageal reflux disease without esophagitis: Secondary | ICD-10-CM

## 2023-05-05 DIAGNOSIS — I10 Essential (primary) hypertension: Secondary | ICD-10-CM

## 2023-05-05 NOTE — Patient Instructions (Addendum)
See if she is currently on pantoprazole/protonix- if not let me refill that- If she's on it that's already a reasonable treatment for reflux- make sure before breakfast or dinner - could add a single Pepcid over the counter before dinner to see if helpful -if needs refill let me know  - restart Flonase 1 spray each nostril  in the morning- allergies could contribute to cough and hoarseness as well as possible reflux  Bloodwork next time  If eye issue or breast issue worsen or fail to improve please let me know sooner  Recommended follow up: Return in about 6 months (around 11/03/2023) for followup or sooner if needed.Schedule b4 you leave.

## 2023-05-05 NOTE — Progress Notes (Signed)
Phone [phone] In person visit   Subjective:   Sara Medina is a 89 y.o. year old very pleasant female patient who presents for/with See problem oriented charting Chief Complaint  Patient presents with   hoaresness    Pt c/o hoarseness that has been going on a couple of months   right pinky toe    Pt c/o right pinky toe pain    itchy eyes   spot on right breast    Past Medical History-  Patient Active Problem List   Diagnosis Date Noted   Moderate dementia, without behavioral disturbance 09/07/2021    Priority: High   Chronic diarrhea 12/30/2020    Priority: High   Mitral valve prolapse 09/06/2021    Priority: Medium    Neuropathy 09/06/2021    Priority: Medium    S/P lumbar fusion 01/27/2021    Priority: Medium    Burning sensation of feet 06/21/2020    Priority: Medium    Palpitations 06/30/2014    Priority: Medium    Moderate persistent asthma 08/18/2010    Priority: Medium    Hypothyroidism 05/04/2010    Priority: Medium    Hyperlipidemia 05/04/2010    Priority: Medium    Essential hypertension 05/04/2010    Priority: Medium    Asthma 05/04/2010    Priority: Medium    GERD (gastroesophageal reflux disease) 05/04/2010    Priority: Medium    Cystocele 10/02/2014    Priority: Low   Benign neoplasm of colon 05/04/2010    Priority: Low   Allergic rhinitis 05/04/2010    Priority: Low   Osteoarthritis 05/04/2010    Priority: Low   History of diverticulitis 05/04/2010    Priority: Low   Leg pain 02/21/2022    Priority: 1.    Medications- reviewed and updated Current Outpatient Medications  Medication Sig Dispense Refill   acetaminophen (TYLENOL) 500 MG tablet Take 1,000 mg by mouth every 6 (six) hours as needed.     albuterol (VENTOLIN HFA) 108 (90 Base) MCG/ACT inhaler INHALE TWO PUFFS BY MOUTH INTO LUNGS EVERY 6 HOURS AS NEEDED FOR WHEEZING/SHORTNESS OF BREATH 18 g 4   amLODipine (NORVASC) 5 MG tablet TAKE 1 TABLET BY MOUTH TWICE A DAY 180 tablet 3    busPIRone (BUSPAR) 5 MG tablet TAKE 1 TABLET BY MOUTH TWICE A DAY AS NEEDED 60 tablet 5   calcitonin, salmon, (MIACALCIN) 200 UNIT/ACT nasal spray Place 1 spray into alternate nostrils daily. For 1 month 3.7 mL 1   Cholecalciferol (VITAMIN D-3) 25 MCG (1000 UT) CAPS Take 1,000 Units by mouth daily.     citalopram (CELEXA) 10 MG tablet Take 1 tablet (10 mg total) by mouth daily. 90 tablet 3   EPINEPHrine 0.3 mg/0.3 mL IJ SOAJ injection Inject 0.3 mg into the muscle as needed for anaphylaxis. 1 each 1   fluticasone (FLONASE) 50 MCG/ACT nasal spray Place 1 spray into both nostrils daily. 16 g 3   gabapentin (NEURONTIN) 300 MG capsule TAKE UP TO 4 CAPSULES BY MOUTH AT BEDTIME FOR CRAMPS 120 capsule 1   levothyroxine (SYNTHROID) 25 MCG tablet TAKE ONE TABLET BY MOUTH DAILY BEFORE BREAKFAST 90 tablet 3   memantine (NAMENDA) 10 MG tablet TAKE 1 TABLET BY MOUTH TWICE A DAY 180 tablet 3   METAMUCIL FIBER PO Take by mouth.     metoprolol tartrate (LOPRESSOR) 25 MG tablet TAKE 2 TABLETS BY MOUTH EVERY MORNING AND 1 TABLET IN THE EVENING 270 tablet 3   pantoprazole (PROTONIX) 20  MG tablet Take 1 tablet (20 mg total) by mouth daily. 90 tablet 3   Probiotic Product (ALIGN) 4 MG CAPS Take 4 mg by mouth daily.     Pyridoxine HCl (VITAMIN B-6 PO) Take 1 tablet by mouth daily.     rivastigmine (EXELON) 1.5 MG capsule Take 1 capsule (1.5 mg total) by mouth 2 (two) times daily. 180 capsule 4   vitamin B-12 (CYANOCOBALAMIN) 500 MCG tablet Take 500 mcg by mouth 2 (two) times daily.     No current facility-administered medications for this visit.     Objective:  BP 122/74   Pulse 65   Temp (!) 97.2 F (36.2 C)   Ht 5\' 3"  (1.6 m)   Wt 155 lb 3.2 oz (70.4 kg)   SpO2 96%   BMI 27.49 kg/m  Gen: NAD, resting comfortably Frontal and maxillary sinuses mildly tender, oropharynx normal other than some drainage CV: RRR no murmurs rubs or gallops Lungs: CTAB no crackles, wheeze, rhonchi Abdomen:  soft/nontender/nondistended/normal bowel sounds. No rebound or guarding.  Ext: trace edema Skin: warm, dry Neuro: grossly normal, moves all extremities, PERRLA    Assessment and Plan   # Hoarseness/cough S:has been hoarse for months and months. Have tried multiple at home without benefit.  Spits with this as well -At last visit in June mentioned cough off and on for several months.  Previously on Breo but stopped due to dry mouth and mouth sores-was not rinsing mouth out well afterwards -They had considered dose free later at that time - did already have CXR for similar symptoms 03/03/22 reassuring A/P: over a year of intermittent cough and hoarseness - possible GERD -could be reflux- see if on pantoprazole at home- if on this add Pepcid before dinner. If not on we will renew - restart Flonase 1 spray each nostril - allergies could contribute to cough and hoarseness  #Eye ache S:has been waking up for at leat 5 months complaining of right eye aching. Mainly in the morning- then started noticing similar on the left eye today . No headaches. No blurry vision - wears glasses. Saw eye doctor but was not as prominent then and didn't mention.eye exam reassuring  A/P: has some tenderness over frontal and maxillary sinuses- wonder if Flonase may help her if allergy related. Reports normal vision and eye exam. Discussed possible imaging sinuses or even MRI brain- wants to hold off for now    # Right breast lesion S:cat jumps on her and she think this is causing soreness. Cat tends to only go on the right breast. She wants to make sure nothing else going on.   A/P: exam she is diffusely tender - suspect is related to cat jumping on her- offered mammogram to rule out malignancy but wants to monitor for now and reach out if worsening    # Right pinky toe pain S:hit her toe she thinks- also wonders about neuropathy- got wider shoes to try today. Mild annoyance- sleeps ok- not waking up with it- on  gabapentin for neuropathy through Dr. Lucia Gaskins  A/P: mild annoyance- wants to monitor for now  - don't feel strongly about daytime dosing increase on gabapentin- suspect this is related to her neuropathy  #hypertension S: medication: Metoprolol 25 mg twice daily with an extra 25 in the morning, amlodipine 5 mg BP Readings from Last 3 Encounters:  05/05/23 122/74  11/09/22 (!) 147/67  11/03/22 132/72  A/P: well controlled continue current medications   #hypothyroidism S: compliant On  thyroid medication-levothyroxine 25 mcg Lab Results  Component Value Date   TSH 1.29 11/03/2022   A/P:stable- continue current medicines    #Ongoing back pain- tramadol at 25 mg not helping so went up to 50 mg but unfortunately that caused hallucinations. Taking mainly tylenol arthritis thinking 650 mg x 2  in the morning and 2 at bedtime- may add 1 in afternoon about 8 hours after first dose- would be 5 doses total.   Recommended follow up: Return in about 6 months (around 11/03/2023) for followup or sooner if needed.Schedule b4 you leave. Future Appointments  Date Time Provider Department Center  05/16/2023  1:30 PM [facility] ANNUAL WELLNESS VISIT 1 [facility]   Lab/Order associations:   ICD-10-CM   1. Essential hypertension  I10     2. Hypothyroidism, unspecified type  E03.9     3. Gastroesophageal reflux disease without esophagitis  K21.9      Time Spent: 44 minutes of total time (3:00 PM- 3:44 PM) was spent on the date of the encounter performing the following actions: chart review prior to seeing the patient, obtaining history, performing a medically necessary exam, counseling on the treatment plan and potential causes of her symptoms such as breast pain and hoarseness and evaluation options, and documenting in our EHR.    No orders of the defined types were placed in this encounter.   Return precautions advised.  Tana Conch, MD

## 2023-05-08 DIAGNOSIS — R2681 Unsteadiness on feet: Secondary | ICD-10-CM | POA: Diagnosis not present

## 2023-05-08 DIAGNOSIS — M542 Cervicalgia: Secondary | ICD-10-CM | POA: Diagnosis not present

## 2023-05-08 DIAGNOSIS — M5459 Other low back pain: Secondary | ICD-10-CM | POA: Diagnosis not present

## 2023-05-08 DIAGNOSIS — R41841 Cognitive communication deficit: Secondary | ICD-10-CM | POA: Diagnosis not present

## 2023-05-10 DIAGNOSIS — R2681 Unsteadiness on feet: Secondary | ICD-10-CM | POA: Diagnosis not present

## 2023-05-10 DIAGNOSIS — M5459 Other low back pain: Secondary | ICD-10-CM | POA: Diagnosis not present

## 2023-05-10 DIAGNOSIS — M542 Cervicalgia: Secondary | ICD-10-CM | POA: Diagnosis not present

## 2023-05-10 DIAGNOSIS — R41841 Cognitive communication deficit: Secondary | ICD-10-CM | POA: Diagnosis not present

## 2023-05-13 ENCOUNTER — Other Ambulatory Visit: Payer: Self-pay | Admitting: Neurology

## 2023-05-15 DIAGNOSIS — F4321 Adjustment disorder with depressed mood: Secondary | ICD-10-CM | POA: Diagnosis not present

## 2023-05-17 DIAGNOSIS — M542 Cervicalgia: Secondary | ICD-10-CM | POA: Diagnosis not present

## 2023-05-17 DIAGNOSIS — R41841 Cognitive communication deficit: Secondary | ICD-10-CM | POA: Diagnosis not present

## 2023-05-17 DIAGNOSIS — M5459 Other low back pain: Secondary | ICD-10-CM | POA: Diagnosis not present

## 2023-05-17 DIAGNOSIS — R2681 Unsteadiness on feet: Secondary | ICD-10-CM | POA: Diagnosis not present

## 2023-05-22 ENCOUNTER — Other Ambulatory Visit: Payer: Self-pay | Admitting: Family Medicine

## 2023-05-22 DIAGNOSIS — M5459 Other low back pain: Secondary | ICD-10-CM | POA: Diagnosis not present

## 2023-05-22 DIAGNOSIS — R41841 Cognitive communication deficit: Secondary | ICD-10-CM | POA: Diagnosis not present

## 2023-05-22 DIAGNOSIS — M542 Cervicalgia: Secondary | ICD-10-CM | POA: Diagnosis not present

## 2023-05-22 DIAGNOSIS — R2681 Unsteadiness on feet: Secondary | ICD-10-CM | POA: Diagnosis not present

## 2023-05-29 ENCOUNTER — Other Ambulatory Visit: Payer: Self-pay

## 2023-05-29 DIAGNOSIS — F4321 Adjustment disorder with depressed mood: Secondary | ICD-10-CM | POA: Diagnosis not present

## 2023-05-29 DIAGNOSIS — R1030 Lower abdominal pain, unspecified: Secondary | ICD-10-CM

## 2023-05-29 NOTE — Telephone Encounter (Signed)
Referral has been placed. 

## 2023-06-01 DIAGNOSIS — R2681 Unsteadiness on feet: Secondary | ICD-10-CM | POA: Diagnosis not present

## 2023-06-01 DIAGNOSIS — M542 Cervicalgia: Secondary | ICD-10-CM | POA: Diagnosis not present

## 2023-06-01 DIAGNOSIS — M5459 Other low back pain: Secondary | ICD-10-CM | POA: Diagnosis not present

## 2023-06-14 DIAGNOSIS — M5459 Other low back pain: Secondary | ICD-10-CM | POA: Diagnosis not present

## 2023-06-14 DIAGNOSIS — R2681 Unsteadiness on feet: Secondary | ICD-10-CM | POA: Diagnosis not present

## 2023-06-14 DIAGNOSIS — M542 Cervicalgia: Secondary | ICD-10-CM | POA: Diagnosis not present

## 2023-06-15 DIAGNOSIS — F4321 Adjustment disorder with depressed mood: Secondary | ICD-10-CM | POA: Diagnosis not present

## 2023-06-19 DIAGNOSIS — M542 Cervicalgia: Secondary | ICD-10-CM | POA: Diagnosis not present

## 2023-06-19 DIAGNOSIS — R2681 Unsteadiness on feet: Secondary | ICD-10-CM | POA: Diagnosis not present

## 2023-06-19 DIAGNOSIS — M5459 Other low back pain: Secondary | ICD-10-CM | POA: Diagnosis not present

## 2023-06-21 DIAGNOSIS — M542 Cervicalgia: Secondary | ICD-10-CM | POA: Diagnosis not present

## 2023-06-21 DIAGNOSIS — M5459 Other low back pain: Secondary | ICD-10-CM | POA: Diagnosis not present

## 2023-06-21 DIAGNOSIS — R2681 Unsteadiness on feet: Secondary | ICD-10-CM | POA: Diagnosis not present

## 2023-06-26 ENCOUNTER — Encounter: Payer: Self-pay | Admitting: Family Medicine

## 2023-06-26 DIAGNOSIS — R2681 Unsteadiness on feet: Secondary | ICD-10-CM | POA: Diagnosis not present

## 2023-06-26 DIAGNOSIS — M542 Cervicalgia: Secondary | ICD-10-CM | POA: Diagnosis not present

## 2023-06-26 DIAGNOSIS — M5459 Other low back pain: Secondary | ICD-10-CM | POA: Diagnosis not present

## 2023-06-27 ENCOUNTER — Other Ambulatory Visit: Payer: Self-pay

## 2023-06-27 MED ORDER — BUSPIRONE HCL 5 MG PO TABS: 5.0000 mg | ORAL_TABLET | Freq: Two times a day (BID) | ORAL | 3 refills | Status: DC | PRN

## 2023-06-29 DIAGNOSIS — R2681 Unsteadiness on feet: Secondary | ICD-10-CM | POA: Diagnosis not present

## 2023-06-29 DIAGNOSIS — M5459 Other low back pain: Secondary | ICD-10-CM | POA: Diagnosis not present

## 2023-06-29 DIAGNOSIS — M542 Cervicalgia: Secondary | ICD-10-CM | POA: Diagnosis not present

## 2023-07-03 DIAGNOSIS — M542 Cervicalgia: Secondary | ICD-10-CM | POA: Diagnosis not present

## 2023-07-03 DIAGNOSIS — R2681 Unsteadiness on feet: Secondary | ICD-10-CM | POA: Diagnosis not present

## 2023-07-03 DIAGNOSIS — M5459 Other low back pain: Secondary | ICD-10-CM | POA: Diagnosis not present

## 2023-07-06 DIAGNOSIS — F02A0 Dementia in other diseases classified elsewhere, mild, without behavioral disturbance, psychotic disturbance, mood disturbance, and anxiety: Secondary | ICD-10-CM | POA: Diagnosis not present

## 2023-07-10 DIAGNOSIS — M542 Cervicalgia: Secondary | ICD-10-CM | POA: Diagnosis not present

## 2023-07-10 DIAGNOSIS — M5459 Other low back pain: Secondary | ICD-10-CM | POA: Diagnosis not present

## 2023-07-10 DIAGNOSIS — R2681 Unsteadiness on feet: Secondary | ICD-10-CM | POA: Diagnosis not present

## 2023-07-12 DIAGNOSIS — M5459 Other low back pain: Secondary | ICD-10-CM | POA: Diagnosis not present

## 2023-07-12 DIAGNOSIS — R2681 Unsteadiness on feet: Secondary | ICD-10-CM | POA: Diagnosis not present

## 2023-07-12 DIAGNOSIS — M542 Cervicalgia: Secondary | ICD-10-CM | POA: Diagnosis not present

## 2023-07-13 DIAGNOSIS — F4321 Adjustment disorder with depressed mood: Secondary | ICD-10-CM | POA: Diagnosis not present

## 2023-07-17 ENCOUNTER — Telehealth: Payer: Self-pay

## 2023-07-17 DIAGNOSIS — R2681 Unsteadiness on feet: Secondary | ICD-10-CM | POA: Diagnosis not present

## 2023-07-17 DIAGNOSIS — M542 Cervicalgia: Secondary | ICD-10-CM | POA: Diagnosis not present

## 2023-07-17 DIAGNOSIS — M5459 Other low back pain: Secondary | ICD-10-CM | POA: Diagnosis not present

## 2023-07-17 NOTE — Telephone Encounter (Signed)
 Copied from CRM [id number]. Topic: Clinical - Home Health Verbal Orders >> Jul 14, 2023 11:53 AM Almira Coaster wrote: Caller/Agency: [facility] Number: [phone]/// Fax number [phone] Service Requested: Skilled Nursing/ Education and Speech Frequency: Eval Order Any new concerns about the patient? No   Pease see msg and advise on verbal orders for patient

## 2023-07-17 NOTE — Telephone Encounter (Signed)
 Returned call for verbal orders and lvm with cb number

## 2023-07-17 NOTE — Telephone Encounter (Signed)
May give verbal orders. 

## 2023-07-19 DIAGNOSIS — M542 Cervicalgia: Secondary | ICD-10-CM | POA: Diagnosis not present

## 2023-07-19 DIAGNOSIS — R2681 Unsteadiness on feet: Secondary | ICD-10-CM | POA: Diagnosis not present

## 2023-07-19 DIAGNOSIS — M5459 Other low back pain: Secondary | ICD-10-CM | POA: Diagnosis not present

## 2023-07-21 ENCOUNTER — Telehealth: Payer: Self-pay

## 2023-07-21 NOTE — Telephone Encounter (Signed)
 Fax received from [employer] regarding order for home health skilled nursing for disease education on Dementia and for home health speech for dementia staging. OV required within 30 days of paperwork being filled out. Pt las ov was 05/05/23. Please schedule ov in order for orders to be signed off on and accepted by pt insurance.

## 2023-07-24 DIAGNOSIS — M5459 Other low back pain: Secondary | ICD-10-CM | POA: Diagnosis not present

## 2023-07-24 DIAGNOSIS — R2681 Unsteadiness on feet: Secondary | ICD-10-CM | POA: Diagnosis not present

## 2023-07-24 DIAGNOSIS — M542 Cervicalgia: Secondary | ICD-10-CM | POA: Diagnosis not present

## 2023-07-26 DIAGNOSIS — M5459 Other low back pain: Secondary | ICD-10-CM | POA: Diagnosis not present

## 2023-07-26 DIAGNOSIS — R2681 Unsteadiness on feet: Secondary | ICD-10-CM | POA: Diagnosis not present

## 2023-07-26 DIAGNOSIS — M542 Cervicalgia: Secondary | ICD-10-CM | POA: Diagnosis not present

## 2023-07-29 DIAGNOSIS — F02A0 Dementia in other diseases classified elsewhere, mild, without behavioral disturbance, psychotic disturbance, mood disturbance, and anxiety: Secondary | ICD-10-CM | POA: Diagnosis not present

## 2023-07-31 DIAGNOSIS — R2681 Unsteadiness on feet: Secondary | ICD-10-CM | POA: Diagnosis not present

## 2023-07-31 DIAGNOSIS — M5459 Other low back pain: Secondary | ICD-10-CM | POA: Diagnosis not present

## 2023-07-31 DIAGNOSIS — M542 Cervicalgia: Secondary | ICD-10-CM | POA: Diagnosis not present

## 2023-08-01 ENCOUNTER — Encounter: Payer: Self-pay | Admitting: Family Medicine

## 2023-08-01 ENCOUNTER — Ambulatory Visit (INDEPENDENT_AMBULATORY_CARE_PROVIDER_SITE_OTHER): Payer: Medicare Other | Admitting: Family Medicine

## 2023-08-01 VITALS — BP 136/70 | HR 63 | Temp 97.3°F | Ht 63.0 in | Wt 151.0 lb

## 2023-08-01 DIAGNOSIS — I10 Essential (primary) hypertension: Secondary | ICD-10-CM

## 2023-08-01 DIAGNOSIS — E039 Hypothyroidism, unspecified: Secondary | ICD-10-CM

## 2023-08-01 DIAGNOSIS — J4530 Mild persistent asthma, uncomplicated: Secondary | ICD-10-CM | POA: Diagnosis not present

## 2023-08-01 DIAGNOSIS — F03B0 Unspecified dementia, moderate, without behavioral disturbance, psychotic disturbance, mood disturbance, and anxiety: Secondary | ICD-10-CM

## 2023-08-01 MED ORDER — REXULTI 0.5 MG PO TABS: 0.5000 mg | ORAL_TABLET | Freq: Every day | ORAL | 2 refills | Status: DC

## 2023-08-01 NOTE — Progress Notes (Signed)
 Phone [phone] In person visit   Subjective:   Sara Medina is a 89 y.o. year old very pleasant female patient who presents for/with See problem oriented charting Chief Complaint  Patient presents with   Dementia    Referral to [facility]- needs visit within 30 days in regards to dementia   Past Medical History-  Patient Active Problem List   Diagnosis Date Noted   Moderate dementia, without behavioral disturbance 09/07/2021    Priority: High   Chronic diarrhea 12/30/2020    Priority: High   Mitral valve prolapse 09/06/2021    Priority: Medium    Neuropathy 09/06/2021    Priority: Medium    S/P lumbar fusion 01/27/2021    Priority: Medium    Burning sensation of feet 06/21/2020    Priority: Medium    Palpitations 06/30/2014    Priority: Medium    Hypothyroidism 05/04/2010    Priority: Medium    Hyperlipidemia 05/04/2010    Priority: Medium    Essential hypertension 05/04/2010    Priority: Medium    Asthma 05/04/2010    Priority: Medium    GERD (gastroesophageal reflux disease) 05/04/2010    Priority: Medium    Cystocele 10/02/2014    Priority: Low   Benign neoplasm of colon 05/04/2010    Priority: Low   Allergic rhinitis 05/04/2010    Priority: Low   Osteoarthritis 05/04/2010    Priority: Low   History of diverticulitis 05/04/2010    Priority: Low   Leg pain 02/21/2022    Priority: 1.    Medications- reviewed and updated Current Outpatient Medications  Medication Sig Dispense Refill   acetaminophen (TYLENOL) 500 MG tablet Take 1,000 mg by mouth every 6 (six) hours as needed.     albuterol (VENTOLIN HFA) 108 (90 Base) MCG/ACT inhaler INHALE TWO PUFFS BY MOUTH INTO LUNGS EVERY 6 HOURS AS NEEDED FOR WHEEZING/SHORTNESS OF BREATH 18 g 4   amLODipine (NORVASC) 5 MG tablet TAKE 1 TABLET BY MOUTH TWICE A DAY 180 tablet 3   busPIRone (BUSPAR) 5 MG tablet Take 1 tablet (5 mg total) by mouth 2 (two) times daily as needed. 180 tablet 3   calcitonin, salmon,  (MIACALCIN) 200 UNIT/ACT nasal spray Place 1 spray into alternate nostrils daily. For 1 month 3.7 mL 1   Cholecalciferol (VITAMIN D-3) 25 MCG (1000 UT) CAPS Take 1,000 Units by mouth daily.     citalopram (CELEXA) 10 MG tablet Take 1 tablet (10 mg total) by mouth daily. 90 tablet 3   EPINEPHrine 0.3 mg/0.3 mL IJ SOAJ injection Inject 0.3 mg into the muscle as needed for anaphylaxis. 1 each 1   fluticasone (FLONASE) 50 MCG/ACT nasal spray Place 1 spray into both nostrils daily. 16 g 3   gabapentin (NEURONTIN) 300 MG capsule TAKE UP TO 4 CAPSULES BY MOUTH AT BEDTIME FOR CRAMPS 120 capsule 2   levothyroxine (SYNTHROID) 25 MCG tablet TAKE 1 TABLET BY MOUTH DAILY BEFORE BREAKFAST 90 tablet 3   memantine (NAMENDA) 10 MG tablet TAKE 1 TABLET BY MOUTH TWICE A DAY 180 tablet 3   METAMUCIL FIBER PO Take by mouth.     metoprolol tartrate (LOPRESSOR) 25 MG tablet TAKE 2 TABLETS BY MOUTH EVERY MORNING AND 1 TABLET IN THE EVENING 270 tablet 3   pantoprazole (PROTONIX) 20 MG tablet Take 1 tablet (20 mg total) by mouth daily. 90 tablet 3   Probiotic Product (ALIGN) 4 MG CAPS Take 4 mg by mouth daily.     Pyridoxine HCl (  VITAMIN B-6 PO) Take 1 tablet by mouth daily.     rivastigmine (EXELON) 1.5 MG capsule Take 1 capsule (1.5 mg total) by mouth 2 (two) times daily. 180 capsule 4   vitamin B-12 (CYANOCOBALAMIN) 500 MCG tablet Take 500 mcg by mouth 2 (two) times daily.     Brexpiprazole (REXULTI) 0.5 MG TABS Take 1-2 tablets (0.5-1 mg total) by mouth daily. 60 tablet 2   No current facility-administered medications for this visit.     Objective:  BP 136/70   Pulse 63   Temp (!) 97.3 F (36.3 C)   Ht 5\' 3"  (1.6 m)   Wt 151 lb (68.5 kg)   SpO2 96%   BMI 26.75 kg/m  Gen: NAD, resting comfortably CV: RRR no murmurs rubs or gallops Lungs: CTAB no crackles, wheeze, rhonchi Ext: trace edema Skin: warm, dry Neuro: pleaantly confused today    Assessment and Plan   # Moderate updates-patient has not  complained of toe pain or eye ache from last visit to family-continue to monitor for recurrence  # Dementia with agitation-follows with Dr. Rosalyn Gess: Medication: Namenda 10 mg twice daily, Exelon 1.5 mg capsule twice daily   -Patient is enrolled with palliative care dementia program with our to visit once a week or month and may get some assistance with home care as well.  We had signed off on this but patient needed formal face-to-face within 30 days of this and visit today will serve as that  -Reports gradual worsening of hallucinations and delusions that can be very distressing-for instance she thought her daughter was coming to harm her Today and was crying about this A/P: Dementia with ongoing gradual worsening - Now with hallucinations and delusions seeming to progress over time-discussed trial of Rexulti and we opted for this 0.5 mg for 7 days then 1 mg for a month or so and see me back in a month or 2 to consider titrating to 2 mg if she tolerates this  #hypertension S: medication: metoprolol 25 mg twice daily with extra 25 mg in am, amlodipine 5 mg A/P: High acceptable reading-continue current medication  #hypothyroidism S: compliant On thyroid medication-levothyroxine 25 mcg daily Lab Results  Component Value Date   TSH 1.29 11/03/2022  A/P: Controlled. Continue current medications.  # Asthma S: Maintenance Medication: none since Breo caused dry mouth and sores As needed medication: Albuterol.  A/P: No reported issues-continue current medication   # Depression/anxiety/hot flashes S: Medication: Citalopram 10 mg (confusion on 20 mg), buspirone 5 mg twice daily  A/P: Reasonable control but is not controlling agitation-see Rexulti discussion above   # GERD-can present with cough or hoarseness S:Medication: Pantoprazole 20 mg in the morning, Pepcid before bed-before having Pepcid added at night noted more hoarseness/cough A/P: Significant improvement with adding Pepcid-continue  current medication    Recommended follow up: Return for next already scheduled visit or sooner if needed.  But also recommended sooner follow-up due to starting Rexulti Future Appointments  Date Time Provider Department Center  10/02/2023  3:00 PM Shelva Majestic, MD [facility]  11/30/2023  2:20 PM Durene Cal Aldine Contes, MD [facility]    Lab/Order associations:   ICD-10-CM   1. Moderate dementia, without behavioral disturbance  F03.B0     2. Hypothyroidism, unspecified type  E03.9     3. Essential hypertension  I10     4. Mild persistent asthma without complication  J45.30       Meds ordered this encounter  Medications   DISCONTD: Brexpiprazole (REXULTI) 0.5 MG TABS    Sig: Take 1-2 tablets (0.5-1 mg total) by mouth daily.    Dispense:  30 tablet    Refill:  2   Brexpiprazole (REXULTI) 0.5 MG TABS    Sig: Take 1-2 tablets (0.5-1 mg total) by mouth daily.    Dispense:  60 tablet    Refill:  2    Return precautions advised.  Tana Conch, MD

## 2023-08-01 NOTE — Patient Instructions (Addendum)
 We will sign off on forms for palliative dementia plan- happy to help any way we can  For first 7 days take only 1 tablet of rexulti, after a week go to 2 tablets in the morning. See me back 1-2 months and may titrate dose up if not responsive  Recommended follow up: Return for next already scheduled visit or sooner if needed.

## 2023-08-02 DIAGNOSIS — R2681 Unsteadiness on feet: Secondary | ICD-10-CM | POA: Diagnosis not present

## 2023-08-02 DIAGNOSIS — M5459 Other low back pain: Secondary | ICD-10-CM | POA: Diagnosis not present

## 2023-08-02 DIAGNOSIS — M542 Cervicalgia: Secondary | ICD-10-CM | POA: Diagnosis not present

## 2023-08-07 DIAGNOSIS — M5459 Other low back pain: Secondary | ICD-10-CM | POA: Diagnosis not present

## 2023-08-07 DIAGNOSIS — M542 Cervicalgia: Secondary | ICD-10-CM | POA: Diagnosis not present

## 2023-08-07 DIAGNOSIS — R2681 Unsteadiness on feet: Secondary | ICD-10-CM | POA: Diagnosis not present

## 2023-08-09 DIAGNOSIS — R2681 Unsteadiness on feet: Secondary | ICD-10-CM | POA: Diagnosis not present

## 2023-08-09 DIAGNOSIS — M5459 Other low back pain: Secondary | ICD-10-CM | POA: Diagnosis not present

## 2023-08-09 DIAGNOSIS — M542 Cervicalgia: Secondary | ICD-10-CM | POA: Diagnosis not present

## 2023-08-14 DIAGNOSIS — R2681 Unsteadiness on feet: Secondary | ICD-10-CM | POA: Diagnosis not present

## 2023-08-14 DIAGNOSIS — M542 Cervicalgia: Secondary | ICD-10-CM | POA: Diagnosis not present

## 2023-08-14 DIAGNOSIS — M5459 Other low back pain: Secondary | ICD-10-CM | POA: Diagnosis not present

## 2023-08-16 DIAGNOSIS — R2681 Unsteadiness on feet: Secondary | ICD-10-CM | POA: Diagnosis not present

## 2023-08-16 DIAGNOSIS — M5459 Other low back pain: Secondary | ICD-10-CM | POA: Diagnosis not present

## 2023-08-16 DIAGNOSIS — M542 Cervicalgia: Secondary | ICD-10-CM | POA: Diagnosis not present

## 2023-08-18 ENCOUNTER — Other Ambulatory Visit: Payer: Self-pay | Admitting: Neurology

## 2023-08-21 DIAGNOSIS — R2681 Unsteadiness on feet: Secondary | ICD-10-CM | POA: Diagnosis not present

## 2023-08-21 DIAGNOSIS — M5459 Other low back pain: Secondary | ICD-10-CM | POA: Diagnosis not present

## 2023-08-21 DIAGNOSIS — M542 Cervicalgia: Secondary | ICD-10-CM | POA: Diagnosis not present

## 2023-08-24 DIAGNOSIS — M5459 Other low back pain: Secondary | ICD-10-CM | POA: Diagnosis not present

## 2023-08-24 DIAGNOSIS — M542 Cervicalgia: Secondary | ICD-10-CM | POA: Diagnosis not present

## 2023-08-24 DIAGNOSIS — R2681 Unsteadiness on feet: Secondary | ICD-10-CM | POA: Diagnosis not present

## 2023-08-28 ENCOUNTER — Other Ambulatory Visit: Payer: Self-pay | Admitting: Family Medicine

## 2023-08-28 DIAGNOSIS — M542 Cervicalgia: Secondary | ICD-10-CM | POA: Diagnosis not present

## 2023-08-28 DIAGNOSIS — R2681 Unsteadiness on feet: Secondary | ICD-10-CM | POA: Diagnosis not present

## 2023-08-28 DIAGNOSIS — M5459 Other low back pain: Secondary | ICD-10-CM | POA: Diagnosis not present

## 2023-08-29 DIAGNOSIS — F02A0 Dementia in other diseases classified elsewhere, mild, without behavioral disturbance, psychotic disturbance, mood disturbance, and anxiety: Secondary | ICD-10-CM | POA: Diagnosis not present

## 2023-08-30 ENCOUNTER — Telehealth: Payer: Self-pay | Admitting: Family Medicine

## 2023-08-30 DIAGNOSIS — M5459 Other low back pain: Secondary | ICD-10-CM | POA: Diagnosis not present

## 2023-08-30 DIAGNOSIS — M542 Cervicalgia: Secondary | ICD-10-CM | POA: Diagnosis not present

## 2023-08-30 DIAGNOSIS — R2681 Unsteadiness on feet: Secondary | ICD-10-CM | POA: Diagnosis not present

## 2023-08-30 NOTE — Telephone Encounter (Signed)
 [facility] [place] faxed FL2, to be filled out by provider. Patient requested to send it back via Fax within 5-days. Document is located in providers tray at front office.Please advise at  [phone].

## 2023-08-31 ENCOUNTER — Encounter: Payer: Self-pay | Admitting: Family Medicine

## 2023-08-31 NOTE — Telephone Encounter (Signed)
 Completed my portion but needs tuberculosis section completed-I would double check to make sure she needs that

## 2023-08-31 NOTE — Telephone Encounter (Signed)
 Form in your to sign folder

## 2023-09-01 NOTE — Telephone Encounter (Signed)
 Mychart message to pt has been sent to make aware.

## 2023-09-05 ENCOUNTER — Other Ambulatory Visit

## 2023-09-05 ENCOUNTER — Encounter

## 2023-09-05 ENCOUNTER — Telehealth: Payer: Self-pay

## 2023-09-05 DIAGNOSIS — Z111 Encounter for screening for respiratory tuberculosis: Secondary | ICD-10-CM | POA: Diagnosis not present

## 2023-09-05 DIAGNOSIS — Z201 Contact with and (suspected) exposure to tuberculosis: Secondary | ICD-10-CM | POA: Diagnosis not present

## 2023-09-05 DIAGNOSIS — Z1383 Encounter for screening for respiratory disorder NEC: Secondary | ICD-10-CM | POA: Diagnosis not present

## 2023-09-05 NOTE — Telephone Encounter (Signed)
 I called the number below that was left and this was not a correct number. We are awaiting for pt to come in today to have her PPD test done, once that has been resulted we will fax everything back to [facility].  Copied from CRM [id number]. Topic: Clinical - Medication Question >> Sep 05, 2023  9:29 AM Sonny Dandy B wrote: Reason for CRM: Patrica from [facility] in [place] [phone] called to follow up on clinical documents, FL2 Diet order, that was sent over on 4/1 calling to follow up if document were completed. She is requesting the documents are faxed to [phone]

## 2023-09-06 DIAGNOSIS — M5459 Other low back pain: Secondary | ICD-10-CM | POA: Diagnosis not present

## 2023-09-06 DIAGNOSIS — R2681 Unsteadiness on feet: Secondary | ICD-10-CM | POA: Diagnosis not present

## 2023-09-06 DIAGNOSIS — M542 Cervicalgia: Secondary | ICD-10-CM | POA: Diagnosis not present

## 2023-09-08 ENCOUNTER — Encounter: Payer: Self-pay | Admitting: Family Medicine

## 2023-09-08 ENCOUNTER — Telehealth: Payer: Self-pay

## 2023-09-08 LAB — QUANTIFERON-TB GOLD PLUS
Mitogen-NIL: >10 [IU]/mL
NIL: 0.04 [IU]/mL
QuantiFERON-TB Gold Plus: NEGATIVE
TB1-NIL: 0 [IU]/mL
TB2-NIL: 0 [IU]/mL

## 2023-09-08 NOTE — Telephone Encounter (Signed)
 Called patient's daughter and was able to inform her that paperwork was completed and has been faxed over to [facility] [place]. I then LVM informing Elease Hashimoto from [facility], that I have faxed it to [phone] twice and placed paperwork with fax confirmation on CMA's desk for review.

## 2023-09-08 NOTE — Telephone Encounter (Signed)
 Called and lm for [employer]. Form is in Dr. Pamala Hurry folder to be reviewed once more before faxing back to [facility].  Copied from CRM [id number]. Topic: Clinical - Medication Question >> Sep 05, 2023  9:29 AM Sonny Dandy B wrote: Reason for CRM: Patrica from [facility] in [place] [phone] called to follow up on clinical documents, FL2 Diet order, that was sent over on 4/1 calling to follow up if document were completed. She is requesting the documents are faxed to [phone] >> Sep 06, 2023 12:41 PM [place] J wrote: I spoke to Quesada who was calling back regarding paperwork. I was able to speak with CAL and it looks like the fax number provided does not want to work. Elease Hashimoto was wondering if we were able to send over documents via email? I let her know that Ardine Bjork was on lunch and will be back around 1:30. Down below I have Patricia's information including the corrected contact number.  Call back number: [phone] [email or web address]

## 2023-09-14 DIAGNOSIS — M79604 Pain in right leg: Secondary | ICD-10-CM | POA: Diagnosis not present

## 2023-09-14 DIAGNOSIS — M542 Cervicalgia: Secondary | ICD-10-CM | POA: Diagnosis not present

## 2023-09-14 DIAGNOSIS — M545 Low back pain, unspecified: Secondary | ICD-10-CM | POA: Diagnosis not present

## 2023-09-15 DIAGNOSIS — G309 Alzheimer's disease, unspecified: Secondary | ICD-10-CM | POA: Diagnosis not present

## 2023-09-15 DIAGNOSIS — F331 Major depressive disorder, recurrent, moderate: Secondary | ICD-10-CM | POA: Diagnosis not present

## 2023-09-15 DIAGNOSIS — F411 Generalized anxiety disorder: Secondary | ICD-10-CM | POA: Diagnosis not present

## 2023-09-15 DIAGNOSIS — F02B2 Dementia in other diseases classified elsewhere, moderate, with psychotic disturbance: Secondary | ICD-10-CM | POA: Diagnosis not present

## 2023-09-18 NOTE — Progress Notes (Signed)
 Quantifer[employer] gold faxed to [facility] wit FL2

## 2023-09-19 DIAGNOSIS — M25519 Pain in unspecified shoulder: Secondary | ICD-10-CM | POA: Diagnosis not present

## 2023-09-19 DIAGNOSIS — F02B2 Dementia in other diseases classified elsewhere, moderate, with psychotic disturbance: Secondary | ICD-10-CM | POA: Diagnosis not present

## 2023-09-19 DIAGNOSIS — S81811A Laceration without foreign body, right lower leg, initial encounter: Secondary | ICD-10-CM | POA: Diagnosis not present

## 2023-09-19 DIAGNOSIS — K219 Gastro-esophageal reflux disease without esophagitis: Secondary | ICD-10-CM | POA: Diagnosis not present

## 2023-09-19 DIAGNOSIS — E039 Hypothyroidism, unspecified: Secondary | ICD-10-CM | POA: Diagnosis not present

## 2023-09-19 DIAGNOSIS — G309 Alzheimer's disease, unspecified: Secondary | ICD-10-CM | POA: Diagnosis not present

## 2023-09-19 DIAGNOSIS — H9190 Unspecified hearing loss, unspecified ear: Secondary | ICD-10-CM | POA: Diagnosis not present

## 2023-09-19 DIAGNOSIS — W19XXXA Unspecified fall, initial encounter: Secondary | ICD-10-CM | POA: Diagnosis not present

## 2023-09-19 DIAGNOSIS — I10 Essential (primary) hypertension: Secondary | ICD-10-CM | POA: Diagnosis not present

## 2023-09-19 DIAGNOSIS — K5900 Constipation, unspecified: Secondary | ICD-10-CM | POA: Diagnosis not present

## 2023-09-21 DIAGNOSIS — E039 Hypothyroidism, unspecified: Secondary | ICD-10-CM | POA: Diagnosis not present

## 2023-09-21 DIAGNOSIS — E559 Vitamin D deficiency, unspecified: Secondary | ICD-10-CM | POA: Diagnosis not present

## 2023-09-21 DIAGNOSIS — E782 Mixed hyperlipidemia: Secondary | ICD-10-CM | POA: Diagnosis not present

## 2023-09-21 DIAGNOSIS — Z131 Encounter for screening for diabetes mellitus: Secondary | ICD-10-CM | POA: Diagnosis not present

## 2023-09-21 DIAGNOSIS — I10 Essential (primary) hypertension: Secondary | ICD-10-CM | POA: Diagnosis not present

## 2023-09-22 DIAGNOSIS — R2681 Unsteadiness on feet: Secondary | ICD-10-CM | POA: Diagnosis not present

## 2023-09-22 DIAGNOSIS — M6259 Muscle wasting and atrophy, not elsewhere classified, multiple sites: Secondary | ICD-10-CM | POA: Diagnosis not present

## 2023-09-22 DIAGNOSIS — M6281 Muscle weakness (generalized): Secondary | ICD-10-CM | POA: Diagnosis not present

## 2023-09-22 DIAGNOSIS — R262 Difficulty in walking, not elsewhere classified: Secondary | ICD-10-CM | POA: Diagnosis not present

## 2023-09-25 DIAGNOSIS — M6259 Muscle wasting and atrophy, not elsewhere classified, multiple sites: Secondary | ICD-10-CM | POA: Diagnosis not present

## 2023-09-25 DIAGNOSIS — G8921 Chronic pain due to trauma: Secondary | ICD-10-CM | POA: Diagnosis not present

## 2023-09-25 DIAGNOSIS — R2681 Unsteadiness on feet: Secondary | ICD-10-CM | POA: Diagnosis not present

## 2023-09-25 DIAGNOSIS — H9190 Unspecified hearing loss, unspecified ear: Secondary | ICD-10-CM | POA: Diagnosis not present

## 2023-09-25 DIAGNOSIS — R262 Difficulty in walking, not elsewhere classified: Secondary | ICD-10-CM | POA: Diagnosis not present

## 2023-09-25 DIAGNOSIS — M6281 Muscle weakness (generalized): Secondary | ICD-10-CM | POA: Diagnosis not present

## 2023-09-26 DIAGNOSIS — R531 Weakness: Secondary | ICD-10-CM | POA: Diagnosis not present

## 2023-09-26 DIAGNOSIS — R2681 Unsteadiness on feet: Secondary | ICD-10-CM | POA: Diagnosis not present

## 2023-09-27 DIAGNOSIS — R4181 Age-related cognitive decline: Secondary | ICD-10-CM | POA: Diagnosis not present

## 2023-09-27 DIAGNOSIS — M6281 Muscle weakness (generalized): Secondary | ICD-10-CM | POA: Diagnosis not present

## 2023-09-27 DIAGNOSIS — R262 Difficulty in walking, not elsewhere classified: Secondary | ICD-10-CM | POA: Diagnosis not present

## 2023-09-27 DIAGNOSIS — R41841 Cognitive communication deficit: Secondary | ICD-10-CM | POA: Diagnosis not present

## 2023-09-27 DIAGNOSIS — M6259 Muscle wasting and atrophy, not elsewhere classified, multiple sites: Secondary | ICD-10-CM | POA: Diagnosis not present

## 2023-09-27 DIAGNOSIS — R2681 Unsteadiness on feet: Secondary | ICD-10-CM | POA: Diagnosis not present

## 2023-09-28 DIAGNOSIS — F02A0 Dementia in other diseases classified elsewhere, mild, without behavioral disturbance, psychotic disturbance, mood disturbance, and anxiety: Secondary | ICD-10-CM | POA: Diagnosis not present

## 2023-09-29 DIAGNOSIS — R2681 Unsteadiness on feet: Secondary | ICD-10-CM | POA: Diagnosis not present

## 2023-09-29 DIAGNOSIS — M6259 Muscle wasting and atrophy, not elsewhere classified, multiple sites: Secondary | ICD-10-CM | POA: Diagnosis not present

## 2023-09-29 DIAGNOSIS — R262 Difficulty in walking, not elsewhere classified: Secondary | ICD-10-CM | POA: Diagnosis not present

## 2023-09-29 DIAGNOSIS — M6281 Muscle weakness (generalized): Secondary | ICD-10-CM | POA: Diagnosis not present

## 2023-10-02 ENCOUNTER — Ambulatory Visit: Admitting: Family Medicine

## 2023-10-02 DIAGNOSIS — R4181 Age-related cognitive decline: Secondary | ICD-10-CM | POA: Diagnosis not present

## 2023-10-02 DIAGNOSIS — R531 Weakness: Secondary | ICD-10-CM | POA: Diagnosis not present

## 2023-10-02 DIAGNOSIS — R41841 Cognitive communication deficit: Secondary | ICD-10-CM | POA: Diagnosis not present

## 2023-10-02 DIAGNOSIS — R2681 Unsteadiness on feet: Secondary | ICD-10-CM | POA: Diagnosis not present

## 2023-10-03 DIAGNOSIS — R531 Weakness: Secondary | ICD-10-CM | POA: Diagnosis not present

## 2023-10-03 DIAGNOSIS — R2681 Unsteadiness on feet: Secondary | ICD-10-CM | POA: Diagnosis not present

## 2023-10-03 DIAGNOSIS — M6281 Muscle weakness (generalized): Secondary | ICD-10-CM | POA: Diagnosis not present

## 2023-10-03 DIAGNOSIS — M6259 Muscle wasting and atrophy, not elsewhere classified, multiple sites: Secondary | ICD-10-CM | POA: Diagnosis not present

## 2023-10-03 DIAGNOSIS — R262 Difficulty in walking, not elsewhere classified: Secondary | ICD-10-CM | POA: Diagnosis not present

## 2023-10-04 DIAGNOSIS — M6281 Muscle weakness (generalized): Secondary | ICD-10-CM | POA: Diagnosis not present

## 2023-10-04 DIAGNOSIS — M6259 Muscle wasting and atrophy, not elsewhere classified, multiple sites: Secondary | ICD-10-CM | POA: Diagnosis not present

## 2023-10-04 DIAGNOSIS — R262 Difficulty in walking, not elsewhere classified: Secondary | ICD-10-CM | POA: Diagnosis not present

## 2023-10-04 DIAGNOSIS — R2681 Unsteadiness on feet: Secondary | ICD-10-CM | POA: Diagnosis not present

## 2023-10-04 DIAGNOSIS — R4181 Age-related cognitive decline: Secondary | ICD-10-CM | POA: Diagnosis not present

## 2023-10-04 DIAGNOSIS — R41841 Cognitive communication deficit: Secondary | ICD-10-CM | POA: Diagnosis not present

## 2023-10-05 DIAGNOSIS — R531 Weakness: Secondary | ICD-10-CM | POA: Diagnosis not present

## 2023-10-05 DIAGNOSIS — R2681 Unsteadiness on feet: Secondary | ICD-10-CM | POA: Diagnosis not present

## 2023-10-09 DIAGNOSIS — R531 Weakness: Secondary | ICD-10-CM | POA: Diagnosis not present

## 2023-10-09 DIAGNOSIS — R2681 Unsteadiness on feet: Secondary | ICD-10-CM | POA: Diagnosis not present

## 2023-10-10 DIAGNOSIS — M6281 Muscle weakness (generalized): Secondary | ICD-10-CM | POA: Diagnosis not present

## 2023-10-10 DIAGNOSIS — R2681 Unsteadiness on feet: Secondary | ICD-10-CM | POA: Diagnosis not present

## 2023-10-10 DIAGNOSIS — M6259 Muscle wasting and atrophy, not elsewhere classified, multiple sites: Secondary | ICD-10-CM | POA: Diagnosis not present

## 2023-10-10 DIAGNOSIS — G629 Polyneuropathy, unspecified: Secondary | ICD-10-CM | POA: Diagnosis not present

## 2023-10-10 DIAGNOSIS — Z9181 History of falling: Secondary | ICD-10-CM | POA: Diagnosis not present

## 2023-10-10 DIAGNOSIS — R262 Difficulty in walking, not elsewhere classified: Secondary | ICD-10-CM | POA: Diagnosis not present

## 2023-10-10 DIAGNOSIS — G8921 Chronic pain due to trauma: Secondary | ICD-10-CM | POA: Diagnosis not present

## 2023-10-10 DIAGNOSIS — G894 Chronic pain syndrome: Secondary | ICD-10-CM | POA: Diagnosis not present

## 2023-10-10 DIAGNOSIS — M542 Cervicalgia: Secondary | ICD-10-CM | POA: Diagnosis not present

## 2023-10-10 DIAGNOSIS — M545 Low back pain, unspecified: Secondary | ICD-10-CM | POA: Diagnosis not present

## 2023-10-10 DIAGNOSIS — R531 Weakness: Secondary | ICD-10-CM | POA: Diagnosis not present

## 2023-10-11 DIAGNOSIS — R2681 Unsteadiness on feet: Secondary | ICD-10-CM | POA: Diagnosis not present

## 2023-10-11 DIAGNOSIS — R41841 Cognitive communication deficit: Secondary | ICD-10-CM | POA: Diagnosis not present

## 2023-10-11 DIAGNOSIS — M6259 Muscle wasting and atrophy, not elsewhere classified, multiple sites: Secondary | ICD-10-CM | POA: Diagnosis not present

## 2023-10-11 DIAGNOSIS — M6281 Muscle weakness (generalized): Secondary | ICD-10-CM | POA: Diagnosis not present

## 2023-10-11 DIAGNOSIS — R4181 Age-related cognitive decline: Secondary | ICD-10-CM | POA: Diagnosis not present

## 2023-10-11 DIAGNOSIS — R262 Difficulty in walking, not elsewhere classified: Secondary | ICD-10-CM | POA: Diagnosis not present

## 2023-10-12 DIAGNOSIS — R262 Difficulty in walking, not elsewhere classified: Secondary | ICD-10-CM | POA: Diagnosis not present

## 2023-10-12 DIAGNOSIS — R2681 Unsteadiness on feet: Secondary | ICD-10-CM | POA: Diagnosis not present

## 2023-10-12 DIAGNOSIS — M6281 Muscle weakness (generalized): Secondary | ICD-10-CM | POA: Diagnosis not present

## 2023-10-12 DIAGNOSIS — M6259 Muscle wasting and atrophy, not elsewhere classified, multiple sites: Secondary | ICD-10-CM | POA: Diagnosis not present

## 2023-10-13 DIAGNOSIS — R531 Weakness: Secondary | ICD-10-CM | POA: Diagnosis not present

## 2023-10-13 DIAGNOSIS — R2681 Unsteadiness on feet: Secondary | ICD-10-CM | POA: Diagnosis not present

## 2023-10-16 DIAGNOSIS — R2681 Unsteadiness on feet: Secondary | ICD-10-CM | POA: Diagnosis not present

## 2023-10-16 DIAGNOSIS — R531 Weakness: Secondary | ICD-10-CM | POA: Diagnosis not present

## 2023-10-17 DIAGNOSIS — R531 Weakness: Secondary | ICD-10-CM | POA: Diagnosis not present

## 2023-10-17 DIAGNOSIS — R2681 Unsteadiness on feet: Secondary | ICD-10-CM | POA: Diagnosis not present

## 2023-10-19 DIAGNOSIS — M47816 Spondylosis without myelopathy or radiculopathy, lumbar region: Secondary | ICD-10-CM | POA: Diagnosis not present

## 2023-10-19 DIAGNOSIS — R2681 Unsteadiness on feet: Secondary | ICD-10-CM | POA: Diagnosis not present

## 2023-10-19 DIAGNOSIS — E039 Hypothyroidism, unspecified: Secondary | ICD-10-CM | POA: Diagnosis not present

## 2023-10-19 DIAGNOSIS — F02B2 Dementia in other diseases classified elsewhere, moderate, with psychotic disturbance: Secondary | ICD-10-CM | POA: Diagnosis not present

## 2023-10-19 DIAGNOSIS — G309 Alzheimer's disease, unspecified: Secondary | ICD-10-CM | POA: Diagnosis not present

## 2023-10-19 DIAGNOSIS — M6281 Muscle weakness (generalized): Secondary | ICD-10-CM | POA: Diagnosis not present

## 2023-10-19 DIAGNOSIS — M6259 Muscle wasting and atrophy, not elsewhere classified, multiple sites: Secondary | ICD-10-CM | POA: Diagnosis not present

## 2023-10-19 DIAGNOSIS — R262 Difficulty in walking, not elsewhere classified: Secondary | ICD-10-CM | POA: Diagnosis not present

## 2023-10-20 DIAGNOSIS — R2681 Unsteadiness on feet: Secondary | ICD-10-CM | POA: Diagnosis not present

## 2023-10-20 DIAGNOSIS — F411 Generalized anxiety disorder: Secondary | ICD-10-CM | POA: Diagnosis not present

## 2023-10-20 DIAGNOSIS — G309 Alzheimer's disease, unspecified: Secondary | ICD-10-CM | POA: Diagnosis not present

## 2023-10-20 DIAGNOSIS — F331 Major depressive disorder, recurrent, moderate: Secondary | ICD-10-CM | POA: Diagnosis not present

## 2023-10-20 DIAGNOSIS — F02C2 Dementia in other diseases classified elsewhere, severe, with psychotic disturbance: Secondary | ICD-10-CM | POA: Diagnosis not present

## 2023-10-20 DIAGNOSIS — R531 Weakness: Secondary | ICD-10-CM | POA: Diagnosis not present

## 2023-10-22 ENCOUNTER — Emergency Department (HOSPITAL_COMMUNITY)

## 2023-10-22 ENCOUNTER — Emergency Department (HOSPITAL_COMMUNITY)
Admission: EM | Admit: 2023-10-22 | Discharge: 2023-10-22 | Disposition: A | Discharge status: Skilled Nursing Facility | Attending: Emergency Medicine | Admitting: Emergency Medicine

## 2023-10-22 ENCOUNTER — Encounter (HOSPITAL_COMMUNITY): Payer: Self-pay | Admitting: Emergency Medicine

## 2023-10-22 ENCOUNTER — Other Ambulatory Visit: Payer: Self-pay

## 2023-10-22 DIAGNOSIS — E039 Hypothyroidism, unspecified: Secondary | ICD-10-CM | POA: Insufficient documentation

## 2023-10-22 DIAGNOSIS — R410 Disorientation, unspecified: Secondary | ICD-10-CM | POA: Insufficient documentation

## 2023-10-22 DIAGNOSIS — I10 Essential (primary) hypertension: Secondary | ICD-10-CM | POA: Insufficient documentation

## 2023-10-22 DIAGNOSIS — N3090 Cystitis, unspecified without hematuria: Secondary | ICD-10-CM | POA: Insufficient documentation

## 2023-10-22 DIAGNOSIS — R531 Weakness: Secondary | ICD-10-CM | POA: Diagnosis not present

## 2023-10-22 DIAGNOSIS — I672 Cerebral atherosclerosis: Secondary | ICD-10-CM | POA: Diagnosis not present

## 2023-10-22 DIAGNOSIS — I700 Atherosclerosis of aorta: Secondary | ICD-10-CM | POA: Diagnosis not present

## 2023-10-22 DIAGNOSIS — F0390 Unspecified dementia without behavioral disturbance: Secondary | ICD-10-CM | POA: Insufficient documentation

## 2023-10-22 DIAGNOSIS — R4182 Altered mental status, unspecified: Secondary | ICD-10-CM | POA: Diagnosis not present

## 2023-10-22 DIAGNOSIS — Z79899 Other long term (current) drug therapy: Secondary | ICD-10-CM | POA: Insufficient documentation

## 2023-10-22 DIAGNOSIS — I6523 Occlusion and stenosis of bilateral carotid arteries: Secondary | ICD-10-CM | POA: Diagnosis not present

## 2023-10-22 DIAGNOSIS — Z91010 Allergy to peanuts: Secondary | ICD-10-CM | POA: Insufficient documentation

## 2023-10-22 LAB — URINALYSIS, W/ REFLEX TO CULTURE (INFECTION SUSPECTED)
Bilirubin Urine: NEGATIVE
Glucose, UA: NEGATIVE mg/dL
Hgb urine dipstick: NEGATIVE
Ketones, ur: NEGATIVE mg/dL
Nitrite: POSITIVE — AB
Protein, ur: NEGATIVE mg/dL
Specific Gravity, Urine: 1.013 (ref 1.005–1.030)
pH: 6 (ref 5.0–8.0)

## 2023-10-22 LAB — CBC WITH DIFFERENTIAL/PLATELET
Abs Immature Granulocytes: 0.02 10*3/uL (ref 0.00–0.07)
Basophils Absolute: 0 10*3/uL (ref 0.0–0.1)
Basophils Relative: 1 %
Eosinophils Absolute: 0.1 10*3/uL (ref 0.0–0.5)
Eosinophils Relative: 1 %
HCT: 42.2 % (ref 36.0–46.0)
Hemoglobin: 14 g/dL (ref 12.0–15.0)
Immature Granulocytes: 0 %
Lymphocytes Relative: 14 %
Lymphs Abs: 0.9 10*3/uL (ref 0.7–4.0)
MCH: 33.1 pg (ref 26.0–34.0)
MCHC: 33.2 g/dL (ref 30.0–36.0)
MCV: 99.8 fL (ref 80.0–100.0)
Monocytes Absolute: 0.7 10*3/uL (ref 0.1–1.0)
Monocytes Relative: 11 %
Neutro Abs: 4.6 10*3/uL (ref 1.7–7.7)
Neutrophils Relative %: 73 %
Platelets: 260 10*3/uL (ref 150–400)
RBC: 4.23 MIL/uL (ref 3.87–5.11)
RDW: 12.1 % (ref 11.5–15.5)
WBC: 6.3 10*3/uL (ref 4.0–10.5)
nRBC: 0 % (ref 0.0–0.2)

## 2023-10-22 LAB — COMPREHENSIVE METABOLIC PANEL WITH GFR
ALT: 26 U/L (ref 0–44)
AST: 28 U/L (ref 15–41)
Albumin: 3.7 g/dL (ref 3.5–5.0)
Alkaline Phosphatase: 72 U/L (ref 38–126)
Anion gap: 9 (ref 5–15)
BUN: 13 mg/dL (ref 8–23)
CO2: 27 mmol/L (ref 22–32)
Calcium: 9 mg/dL (ref 8.9–10.3)
Chloride: 104 mmol/L (ref 98–111)
Creatinine, Ser: 0.88 mg/dL (ref 0.44–1.00)
GFR, Estimated: >60 mL/min (ref >60)
Glucose, Bld: 105 mg/dL — ABNORMAL HIGH (ref 70–99)
Potassium: 3.2 mmol/L — ABNORMAL LOW (ref 3.5–5.1)
Sodium: 140 mmol/L (ref 135–145)
Total Bilirubin: 0.4 mg/dL (ref 0.0–1.2)
Total Protein: 6.9 g/dL (ref 6.5–8.1)

## 2023-10-22 LAB — CBG MONITORING, ED: Glucose-Capillary: 110 mg/dL — ABNORMAL HIGH (ref 70–99)

## 2023-10-22 LAB — TROPONIN I (HIGH SENSITIVITY): Troponin I (High Sensitivity): 10 ng/L (ref <18)

## 2023-10-22 LAB — AMMONIA: Ammonia: 13 umol/L (ref 9–35)

## 2023-10-22 LAB — I-STAT CG4 LACTIC ACID, ED: Lactic Acid, Venous: 0.8 mmol/L (ref 0.5–1.9)

## 2023-10-22 LAB — MAGNESIUM: Magnesium: 2 mg/dL (ref 1.7–2.4)

## 2023-10-22 MED ORDER — LACTATED RINGERS IV BOLUS
500.0000 mL | Freq: Once | INTRAVENOUS | Status: AC
  Administered 2023-10-22: 500 mL via INTRAVENOUS

## 2023-10-22 MED ORDER — CIPROFLOXACIN HCL 500 MG PO TABS: 500.0000 mg | ORAL_TABLET | Freq: Two times a day (BID) | ORAL | 0 refills | Status: AC

## 2023-10-22 MED ORDER — CIPROFLOXACIN HCL 500 MG PO TABS
500.0000 mg | ORAL_TABLET | Freq: Once | ORAL | Status: AC
  Administered 2023-10-22: 500 mg via ORAL
  Filled 2023-10-22: qty 1

## 2023-10-22 MED ORDER — POTASSIUM CHLORIDE CRYS ER 20 MEQ PO TBCR
40.0000 meq | EXTENDED_RELEASE_TABLET | Freq: Once | ORAL | Status: AC
  Administered 2023-10-22: 40 meq via ORAL
  Filled 2023-10-22: qty 2

## 2023-10-22 NOTE — ED Notes (Addendum)
 Pt family taking pt back to [facility]

## 2023-10-22 NOTE — ED Triage Notes (Signed)
 Pt bib EMS from [facility] for increased confusion. Staff stated pt normally walking but lately more weak and tired.

## 2023-10-22 NOTE — Discharge Instructions (Signed)
 A prescription for antibiotics has been sent to your pharmacy for treatment of UTI.  Take as prescribed for the next 7 days.  Return to the emergency department at anytime for any new or worsening symptoms of concern.

## 2023-10-22 NOTE — ED Provider Notes (Signed)
 [place] EMERGENCY DEPARTMENT AT [facility] Provider Note   CSN: [id number] Arrival date & time: 10/22/23  1053     History  Chief Complaint  Patient presents with   Altered Mental Status    Sara Medina is a 89 y.o. female.  HPI Patient presents for altered mental status.  Medical history includes HLD, hypothyroidism, HTN, GERD, arthritis, dementia, depression.  She resides in memory care unit of nursing facility.  Family staff feel like she is more confused than usual.  Per EMS, she is ambulatory at baseline.  Staff also feel that she has been less ambulatory.  Patient currently has no complaints.    Home Medications Prior to Admission medications   Medication Sig Start Date End Date Taking? Authorizing Provider  ciprofloxacin  (CIPRO ) 500 MG tablet Take 1 tablet (500 mg total) by mouth every 12 (twelve) hours for 7 days. 10/22/23 10/29/23 Yes Iva Mariner, MD  acetaminophen  (TYLENOL ) 500 MG tablet Take 1,000 mg by mouth every 6 (six) hours as needed.    [provider]  albuterol  (VENTOLIN  HFA) 108 (90 Base) MCG/ACT inhaler INHALE TWO PUFFS BY MOUTH INTO LUNGS EVERY 6 HOURS AS NEEDED FOR WHEEZING/SHORTNESS OF BREATH 09/19/22   Almira Jaeger, MD  amLODipine  (NORVASC ) 5 MG tablet TAKE 1 TABLET BY MOUTH TWICE A DAY 12/08/22   Almira Jaeger, MD  Brexpiprazole  (REXULTI ) 0.5 MG TABS Take 1-2 tablets (0.5-1 mg total) by mouth daily. 08/01/23   Almira Jaeger, MD  busPIRone  (BUSPAR ) 5 MG tablet Take 1 tablet (5 mg total) by mouth 2 (two) times daily as needed. 06/27/23   Almira Jaeger, MD  calcitonin, salmon, (MIACALCIN) 200 UNIT/ACT nasal spray Place 1 spray into alternate nostrils daily. For 1 month 06/28/22   Almira Jaeger, MD  Cholecalciferol (VITAMIN D -3) 25 MCG (1000 UT) CAPS Take 1,000 Units by mouth daily.    [provider]  citalopram  (CELEXA ) 10 MG tablet Take 1 tablet (10 mg total) by mouth daily. 11/03/22   Almira Jaeger, MD   citalopram  (CELEXA ) 20 MG tablet TAKE 1 TABLET BY MOUTH DAILY 08/28/23   Almira Jaeger, MD  EPINEPHrine  0.3 mg/0.3 mL IJ SOAJ injection Inject 0.3 mg into the muscle as needed for anaphylaxis. 09/19/22   Almira Jaeger, MD  fluticasone  (FLONASE ) 50 MCG/ACT nasal spray Place 1 spray into both nostrils daily. 03/23/22   Desai, Nikita S, MD  gabapentin  (NEURONTIN ) 300 MG capsule TAKE UP TO 4 CAPSULES BY MOUTH AT BEDTIME FOR CRAMPS 08/22/23   Glory Larsen, MD  levothyroxine  (SYNTHROID ) 25 MCG tablet TAKE 1 TABLET BY MOUTH DAILY BEFORE BREAKFAST 05/22/23   Almira Jaeger, MD  memantine  (NAMENDA ) 10 MG tablet TAKE 1 TABLET BY MOUTH TWICE A DAY 12/07/22   Glory Larsen, MD  METAMUCIL FIBER PO Take by mouth.    [provider]  metoprolol  tartrate (LOPRESSOR ) 25 MG tablet TAKE 2 TABLETS BY MOUTH EVERY MORNING AND 1 TABLET IN THE EVENING 03/08/23   Almira Jaeger, MD  pantoprazole  (PROTONIX ) 20 MG tablet TAKE 1 TABLET BY MOUTH DAILY 08/28/23   Almira Jaeger, MD  Probiotic Product (ALIGN) 4 MG CAPS Take 4 mg by mouth daily.    [provider]  Pyridoxine HCl (VITAMIN B-6 PO) Take 1 tablet by mouth daily.    [provider]  rivastigmine  (EXELON ) 1.5 MG capsule Take 1 capsule (1.5 mg total) by mouth 2 (two) times daily. 11/09/22  Glory Larsen, MD  vitamin B-12 (CYANOCOBALAMIN ) 500 MCG tablet Take 500 mcg by mouth 2 (two) times daily.    [provider]      Allergies    Other, Peanut-containing drug products, Pecan nut (diagnostic), and Penicillins    Review of Systems   Review of Systems  Unable to perform ROS: Dementia    Physical Exam Updated Vital Signs BP (!) 154/58   Pulse 71   Temp 99.5 F (37.5 C) (Oral)   Resp (!) 25   Ht 5\' 3"  (1.6 m)   Wt 68.5 kg   SpO2 96%   BMI 26.75 kg/m  Physical Exam Vitals and nursing note reviewed.  Constitutional:      General: She is not in acute distress.    Appearance: Normal appearance. She  is well-developed. She is not ill-appearing, toxic-appearing or diaphoretic.  HENT:     Head: Normocephalic and atraumatic.     Right Ear: External ear normal.     Left Ear: External ear normal.     Nose: Nose normal.     Mouth/Throat:     Mouth: Mucous membranes are moist.  Eyes:     Extraocular Movements: Extraocular movements intact.     Conjunctiva/sclera: Conjunctivae normal.  Cardiovascular:     Rate and Rhythm: Normal rate and regular rhythm.     Heart sounds: No murmur heard. Pulmonary:     Effort: Pulmonary effort is normal. No respiratory distress.     Breath sounds: Rales present.  Abdominal:     General: There is no distension.     Palpations: Abdomen is soft.     Tenderness: There is no abdominal tenderness.  Musculoskeletal:        General: No swelling.     Cervical back: Normal range of motion and neck supple.     Right lower leg: Edema present.     Left lower leg: Edema present.  Skin:    General: Skin is warm and dry.     Coloration: Skin is not jaundiced or pale.  Neurological:     General: No focal deficit present.     Mental Status: She is alert. She is disoriented.     Comments: Bilateral upper extremity tremor.  Psychiatric:        Mood and Affect: Mood normal.        Behavior: Behavior normal.     ED Results / Procedures / Treatments   Labs (all labs ordered are listed, but only abnormal results are displayed) Labs Reviewed  COMPREHENSIVE METABOLIC PANEL WITH GFR - Abnormal; Notable for the following components:      Result Value   Potassium 3.2 (*)    Glucose, Bld 105 (*)    All other components within normal limits  URINALYSIS, W/ REFLEX TO CULTURE (INFECTION SUSPECTED) - Abnormal; Notable for the following components:   APPearance HAZY (*)    Nitrite POSITIVE (*)    Leukocytes,Ua TRACE (*)    Bacteria, UA RARE (*)    All other components within normal limits  CBG MONITORING, ED - Abnormal; Notable for the following components:    Glucose-Capillary 110 (*)    All other components within normal limits  URINE CULTURE  CBC WITH DIFFERENTIAL/PLATELET  AMMONIA  MAGNESIUM  I-STAT CG4 LACTIC ACID, ED  TROPONIN I (HIGH SENSITIVITY)  TROPONIN I (HIGH SENSITIVITY)    EKG EKG Interpretation Date/Time:  Sunday Oct 22 2023 11:24:32 EDT Ventricular Rate:  64 PR Interval:  68  QRS Duration:  106 QT Interval:  425 QTC Calculation: 439 R Axis:   17  Text Interpretation: Sinus rhythm Short PR interval Low voltage, precordial leads Confirmed by Iva Mariner [phone]) on 10/22/2023 12:56:45 PM  Radiology DG Chest Port 1 View Result Date: 10/22/2023 CLINICAL DATA:  Altered mental status, confusion EXAM: PORTABLE CHEST 1 VIEW COMPARISON:  03/03/2022 FINDINGS: Atherosclerotic calcification of the aortic arch. The lungs remain clear. No blunting of the costophrenic angles. No significant bony findings. IMPRESSION: 1. No acute findings. 2. Aortic Atherosclerosis (ICD10-I70.0). Electronically Signed   By: Freida Jes M.D.   On: 10/22/2023 13:42   CT HEAD WO CONTRAST Result Date: 10/22/2023 CLINICAL DATA:  Altered mental status, increased confusion, weakness EXAM: CT HEAD WITHOUT CONTRAST TECHNIQUE: Contiguous axial images were obtained from the base of the skull through the vertex without intravenous contrast. RADIATION DOSE REDUCTION: This exam was performed according to the departmental dose-optimization program which includes automated exposure control, adjustment of the mA and/or kV according to patient size and/or use of iterative reconstruction technique. COMPARISON:  02/12/2021 FINDINGS: Brain: The brainstem, cerebellum, cerebral peduncles, thalami, basal ganglia, basilar cisterns, and ventricular system appear within normal limits. No intracranial hemorrhage, mass lesion, or acute CVA. Periventricular white matter and corona radiata hypodensities favor chronic ischemic microvascular white matter disease. Vascular: There is  atherosclerotic calcification of the cavernous carotid arteries bilaterally. Skull: Unremarkable Sinuses/Orbits: Unremarkable Other: No supplemental non-categorized findings. IMPRESSION: 1. No acute intracranial findings. 2. Periventricular white matter and corona radiata hypodensities favor chronic ischemic microvascular white matter disease. 3. Atherosclerosis. Electronically Signed   By: Freida Jes M.D.   On: 10/22/2023 12:31    Procedures Procedures    Medications Ordered in ED Medications  potassium chloride  SA (KLOR-CON  M) CR tablet 40 mEq (has no administration in time range)  lactated ringers  bolus 500 mL (500 mLs Intravenous New Bag/Given 10/22/23 1202)  ciprofloxacin  (CIPRO ) tablet 500 mg (500 mg Oral Given 10/22/23 1306)    ED Course/ Medical Decision Making/ A&P                                 Medical Decision Making Amount and/or Complexity of Data Reviewed Labs: ordered. Radiology: ordered.  Risk Prescription drug management.   This patient presents to the ED for concern of altered mental status, this involves an extensive number of treatment options, and is a complaint that carries with it a high risk of complications and morbidity.  The differential diagnosis includes infection, deconditioning, progression of neurocognitive disorder, metabolic derangements, polypharmacy, CVA, TIA   Co morbidities / Chronic conditions that complicate the patient evaluation  HLD, hypothyroidism, HTN, GERD, arthritis, dementia, depression   Additional history obtained:  Additional history obtained from EMR External records from outside source obtained and reviewed including paperwork from nursing facility   Lab Tests:  I Ordered, and personally interpreted labs.  The pertinent results include: Kidney function, mild hypokalemia with otherwise normal electrolytes, normal hemoglobin, no leukocytosis.  Urinalysis shows evidence of nitrite positive UTI.   Imaging Studies  ordered:  I ordered imaging studies including chest x-ray, CT head I independently visualized and interpreted imaging which showed no acute findings I agree with the radiologist interpretation   Cardiac Monitoring: / EKG:  The patient was maintained on a cardiac monitor.  I personally viewed and interpreted the cardiac monitored which showed an underlying rhythm of: Sinus rhythm   Problem List / ED  Course / Critical interventions / Medication management  Patient with history of dementia, presenting for what is described as acute on chronic confusion.  On arrival in the ED, vital signs are normal.  Patient is overall well-appearing on exam.  She has no physical complaints at this time.  Abdomen is soft and nontender.  Breathing is unlabored.  On neuroexam, she does have difficult time following commands.  She does seem to have bilateral upper extremity tremor with arm extension.  She does not appear to have any focal deficits.  Patient was placed on monitor and workup was initiated.  Patient's serum lab work notable for mild hypokalemia.  Replacement potassium was ordered.  Urinalysis does show evidence of a nitrite positive UTI.  Patient has listed allergies to penicillins.  Patient to be treated with ciprofloxacin .  Initial dose was ordered in the ED.  On reassessment, patient resting comfortably.  At this point, her 2 daughters are at bedside.  Daughters report that they noticed a change in her mental status over the past week.  Confusion does seem to wax and wane.  They also noted that she was leaning to the side with walking.  Although this could be explained by UTI, patient and daughters were offered MRI to assess for possible small stroke.  Given her age, daughters do not feel this is appropriate.  They state that their mother would feel the same way.  I feel this is reasonable.  Patient was also offered admission which daughters declined.  Patient to be treated for UTI at her facility.   Prescription for ongoing antibiotics was provided.  Patient was discharged in stable condition. I ordered medication including IVF for hydration; potassium chloride  for hypokalemia; ciprofloxacin  for UTI Reevaluation of the patient after these medicines showed that the patient improved I have reviewed the patients home medicines and have made adjustments as needed   Social Determinants of Health:  Has dementia and resides in nursing facility         Final Clinical Impression(s) / ED Diagnoses Final diagnoses:  Cystitis  Delirium    Rx / DC Orders ED Discharge Orders          Ordered    ciprofloxacin  (CIPRO ) 500 MG tablet  Every 12 hours        10/22/23 1411              Iva Mariner, MD 10/22/23 [phone]

## 2023-10-23 DIAGNOSIS — R531 Weakness: Secondary | ICD-10-CM | POA: Diagnosis not present

## 2023-10-23 DIAGNOSIS — R4181 Age-related cognitive decline: Secondary | ICD-10-CM | POA: Diagnosis not present

## 2023-10-23 DIAGNOSIS — R41841 Cognitive communication deficit: Secondary | ICD-10-CM | POA: Diagnosis not present

## 2023-10-23 DIAGNOSIS — R2681 Unsteadiness on feet: Secondary | ICD-10-CM | POA: Diagnosis not present

## 2023-10-24 DIAGNOSIS — M6281 Muscle weakness (generalized): Secondary | ICD-10-CM | POA: Diagnosis not present

## 2023-10-24 DIAGNOSIS — R2681 Unsteadiness on feet: Secondary | ICD-10-CM | POA: Diagnosis not present

## 2023-10-24 DIAGNOSIS — R4182 Altered mental status, unspecified: Secondary | ICD-10-CM | POA: Diagnosis not present

## 2023-10-24 DIAGNOSIS — K029 Dental caries, unspecified: Secondary | ICD-10-CM | POA: Diagnosis not present

## 2023-10-24 DIAGNOSIS — M6259 Muscle wasting and atrophy, not elsewhere classified, multiple sites: Secondary | ICD-10-CM | POA: Diagnosis not present

## 2023-10-24 DIAGNOSIS — R262 Difficulty in walking, not elsewhere classified: Secondary | ICD-10-CM | POA: Diagnosis not present

## 2023-10-24 DIAGNOSIS — N3000 Acute cystitis without hematuria: Secondary | ICD-10-CM | POA: Diagnosis not present

## 2023-10-24 LAB — URINE CULTURE: Culture: >=100000 — AB

## 2023-10-25 ENCOUNTER — Telehealth (HOSPITAL_BASED_OUTPATIENT_CLINIC_OR_DEPARTMENT_OTHER): Payer: Self-pay | Admitting: *Deleted

## 2023-10-25 DIAGNOSIS — R531 Weakness: Secondary | ICD-10-CM | POA: Diagnosis not present

## 2023-10-25 DIAGNOSIS — R2681 Unsteadiness on feet: Secondary | ICD-10-CM | POA: Diagnosis not present

## 2023-10-25 DIAGNOSIS — G8921 Chronic pain due to trauma: Secondary | ICD-10-CM | POA: Diagnosis not present

## 2023-10-25 DIAGNOSIS — M47816 Spondylosis without myelopathy or radiculopathy, lumbar region: Secondary | ICD-10-CM | POA: Diagnosis not present

## 2023-10-25 DIAGNOSIS — R4181 Age-related cognitive decline: Secondary | ICD-10-CM | POA: Diagnosis not present

## 2023-10-25 DIAGNOSIS — R41841 Cognitive communication deficit: Secondary | ICD-10-CM | POA: Diagnosis not present

## 2023-10-25 NOTE — Progress Notes (Signed)
 ED Antimicrobial Stewardship Positive Culture Follow Up   Sara Medina is an 89 y.o. female who presented to [facility] on 10/22/2023 with a chief complaint of  Chief Complaint  Patient presents with   Altered Mental Status    Recent Results (from the past 720 hours)  Urine Culture     Status: Abnormal   Collection Time: 10/22/23 11:24 AM   Specimen: Urine, Clean Catch  Result Value Ref Range Status   Specimen Description   Final    URINE, CLEAN CATCH Performed at [facility], [address], [place], [place] [address]    Special Requests   Final    NONE Performed at [facility], [address] [place], [place] [address]    Culture (A)  Final    >=100,000 COLONIES/mL ESCHERICHIA COLI >=100,000 COLONIES/mL AEROCOCCUS SPECIES Standardized susceptibility testing for this organism is not available. Performed at [facility], [address] [place], [place] [address]    Report Status 10/24/2023 FINAL  Final   Organism ID, Bacteria ESCHERICHIA COLI (A)  Final      Susceptibility   Escherichia coli - MIC*    AMPICILLIN 8 SENSITIVE Sensitive     CEFAZOLIN <=4 SENSITIVE Sensitive     CEFEPIME <=0.12 SENSITIVE Sensitive     CEFTRIAXONE <=0.25 SENSITIVE Sensitive     CIPROFLOXACIN  <=0.25 SENSITIVE Sensitive     GENTAMICIN <=1 SENSITIVE Sensitive     IMIPENEM 1 SENSITIVE Sensitive     NITROFURANTOIN  <=16 SENSITIVE Sensitive     TRIMETH/SULFA <=20 SENSITIVE Sensitive     AMPICILLIN/SULBACTAM 4 SENSITIVE Sensitive     PIP/TAZO <=4 SENSITIVE Sensitive ug/mL    * >=100,000 COLONIES/mL ESCHERICHIA COLI    [x]  Treated with ciprofloxacin , Aerococcus variable susceptibility to prescribed antimicrobial.   52 YOF with history of dementia presented to ED with increasing confusion/AMS for about a week. UA positive for nitrites and trace leukocytes. A CT head was performed and showed no acute findings. An MRI was offered to assess for possible  small stroke but family did not wish to pursue. Patient was started on ciprofloxacin  for UTI due to penicillin allergy. Urine culture grew pan-sensitive E coli, and Aerococcus. Given variable susceptibility of Aerococcus to ciprofloxacin , recommend a change in antibiotic to Macrobid  which provides more reliable coverage to Aerococcus and would also cover E coli that was cultured.   - Call patient to stop ciprofloxacin .  - Start Macrobid  100 mg PO twice daily x 5 days.   ED Provider: Mozell Arias, MD   [name], PharmD Candidate [phone] [employer] 10/25/2023 10:12 AM

## 2023-10-25 NOTE — Telephone Encounter (Signed)
 Post ED Visit - Positive Culture Follow-up: Successful Patient Follow-Up  Culture assessed and recommendations reviewed by:  []  Court [place], Pharm.D. []  Skeet Duke, Pharm.D., BCPS AQ-ID []  Leslee Rase, Pharm.D., BCPS [x]  Garland Junk, [address].D., BCPS []  [place], [address].D., BCPS, AAHIVP []  Alcide Aly, Pharm.D., BCPS, AAHIVP []  Jerri Morale, PharmD, BCPS []  Graham Laws, PharmD, BCPS []  Cleda Curly, PharmD, BCPS []  Tamar Fairly, PharmD  Positive urine culture  []  Patient discharged without antimicrobial prescription and treatment is now indicated [x]  Organism is resistant to prescribed ED discharge antimicrobial []  Patient with positive blood cultures  Changes discussed with ED provider: Mozell Arias New antibiotic prescription Macrobid  100mg  po BID x 5 days Faxed to [place], RN at [facility] Plan: stop ciprofloxacin  and start Macrobid .  Nestor Banter, RN at [employer] and notified her of the plan per EDP, Mozell Arias, date 10/25/23, time 1140.   Zeb Heys 10/25/2023, 11:42 AM

## 2023-10-27 DIAGNOSIS — M6281 Muscle weakness (generalized): Secondary | ICD-10-CM | POA: Diagnosis not present

## 2023-10-27 DIAGNOSIS — R262 Difficulty in walking, not elsewhere classified: Secondary | ICD-10-CM | POA: Diagnosis not present

## 2023-10-27 DIAGNOSIS — R2681 Unsteadiness on feet: Secondary | ICD-10-CM | POA: Diagnosis not present

## 2023-10-27 DIAGNOSIS — M6259 Muscle wasting and atrophy, not elsewhere classified, multiple sites: Secondary | ICD-10-CM | POA: Diagnosis not present

## 2023-10-29 DIAGNOSIS — F02A0 Dementia in other diseases classified elsewhere, mild, without behavioral disturbance, psychotic disturbance, mood disturbance, and anxiety: Secondary | ICD-10-CM | POA: Diagnosis not present

## 2023-10-30 DIAGNOSIS — R262 Difficulty in walking, not elsewhere classified: Secondary | ICD-10-CM | POA: Diagnosis not present

## 2023-10-30 DIAGNOSIS — M6259 Muscle wasting and atrophy, not elsewhere classified, multiple sites: Secondary | ICD-10-CM | POA: Diagnosis not present

## 2023-10-30 DIAGNOSIS — R2681 Unsteadiness on feet: Secondary | ICD-10-CM | POA: Diagnosis not present

## 2023-10-30 DIAGNOSIS — M6281 Muscle weakness (generalized): Secondary | ICD-10-CM | POA: Diagnosis not present

## 2023-10-30 DIAGNOSIS — R41841 Cognitive communication deficit: Secondary | ICD-10-CM | POA: Diagnosis not present

## 2023-10-30 DIAGNOSIS — R4181 Age-related cognitive decline: Secondary | ICD-10-CM | POA: Diagnosis not present

## 2023-10-30 DIAGNOSIS — R531 Weakness: Secondary | ICD-10-CM | POA: Diagnosis not present

## 2023-11-01 DIAGNOSIS — R4181 Age-related cognitive decline: Secondary | ICD-10-CM | POA: Diagnosis not present

## 2023-11-01 DIAGNOSIS — R2681 Unsteadiness on feet: Secondary | ICD-10-CM | POA: Diagnosis not present

## 2023-11-01 DIAGNOSIS — R41841 Cognitive communication deficit: Secondary | ICD-10-CM | POA: Diagnosis not present

## 2023-11-01 DIAGNOSIS — R531 Weakness: Secondary | ICD-10-CM | POA: Diagnosis not present

## 2023-11-02 DIAGNOSIS — E039 Hypothyroidism, unspecified: Secondary | ICD-10-CM | POA: Diagnosis not present

## 2023-11-02 DIAGNOSIS — Z0001 Encounter for general adult medical examination with abnormal findings: Secondary | ICD-10-CM | POA: Diagnosis not present

## 2023-11-02 DIAGNOSIS — G894 Chronic pain syndrome: Secondary | ICD-10-CM | POA: Diagnosis not present

## 2023-11-02 DIAGNOSIS — R600 Localized edema: Secondary | ICD-10-CM | POA: Diagnosis not present

## 2023-11-02 DIAGNOSIS — I10 Essential (primary) hypertension: Secondary | ICD-10-CM | POA: Diagnosis not present

## 2023-11-03 DIAGNOSIS — F331 Major depressive disorder, recurrent, moderate: Secondary | ICD-10-CM | POA: Diagnosis not present

## 2023-11-03 DIAGNOSIS — F02C2 Dementia in other diseases classified elsewhere, severe, with psychotic disturbance: Secondary | ICD-10-CM | POA: Diagnosis not present

## 2023-11-03 DIAGNOSIS — F411 Generalized anxiety disorder: Secondary | ICD-10-CM | POA: Diagnosis not present

## 2023-11-03 DIAGNOSIS — G309 Alzheimer's disease, unspecified: Secondary | ICD-10-CM | POA: Diagnosis not present

## 2023-11-04 DIAGNOSIS — M6281 Muscle weakness (generalized): Secondary | ICD-10-CM | POA: Diagnosis not present

## 2023-11-04 DIAGNOSIS — R262 Difficulty in walking, not elsewhere classified: Secondary | ICD-10-CM | POA: Diagnosis not present

## 2023-11-04 DIAGNOSIS — R2681 Unsteadiness on feet: Secondary | ICD-10-CM | POA: Diagnosis not present

## 2023-11-04 DIAGNOSIS — M6259 Muscle wasting and atrophy, not elsewhere classified, multiple sites: Secondary | ICD-10-CM | POA: Diagnosis not present

## 2023-11-07 DIAGNOSIS — R262 Difficulty in walking, not elsewhere classified: Secondary | ICD-10-CM | POA: Diagnosis not present

## 2023-11-07 DIAGNOSIS — G894 Chronic pain syndrome: Secondary | ICD-10-CM | POA: Diagnosis not present

## 2023-11-07 DIAGNOSIS — G629 Polyneuropathy, unspecified: Secondary | ICD-10-CM | POA: Diagnosis not present

## 2023-11-07 DIAGNOSIS — M47816 Spondylosis without myelopathy or radiculopathy, lumbar region: Secondary | ICD-10-CM | POA: Diagnosis not present

## 2023-11-07 DIAGNOSIS — M6259 Muscle wasting and atrophy, not elsewhere classified, multiple sites: Secondary | ICD-10-CM | POA: Diagnosis not present

## 2023-11-07 DIAGNOSIS — M542 Cervicalgia: Secondary | ICD-10-CM | POA: Diagnosis not present

## 2023-11-07 DIAGNOSIS — R2681 Unsteadiness on feet: Secondary | ICD-10-CM | POA: Diagnosis not present

## 2023-11-07 DIAGNOSIS — M6281 Muscle weakness (generalized): Secondary | ICD-10-CM | POA: Diagnosis not present

## 2023-11-07 DIAGNOSIS — M546 Pain in thoracic spine: Secondary | ICD-10-CM | POA: Diagnosis not present

## 2023-11-08 DIAGNOSIS — R531 Weakness: Secondary | ICD-10-CM | POA: Diagnosis not present

## 2023-11-08 DIAGNOSIS — R2681 Unsteadiness on feet: Secondary | ICD-10-CM | POA: Diagnosis not present

## 2023-11-10 DIAGNOSIS — R2681 Unsteadiness on feet: Secondary | ICD-10-CM | POA: Diagnosis not present

## 2023-11-10 DIAGNOSIS — R531 Weakness: Secondary | ICD-10-CM | POA: Diagnosis not present

## 2023-11-14 DIAGNOSIS — R531 Weakness: Secondary | ICD-10-CM | POA: Diagnosis not present

## 2023-11-14 DIAGNOSIS — R4182 Altered mental status, unspecified: Secondary | ICD-10-CM | POA: Diagnosis not present

## 2023-11-14 DIAGNOSIS — R2681 Unsteadiness on feet: Secondary | ICD-10-CM | POA: Diagnosis not present

## 2023-11-14 DIAGNOSIS — M546 Pain in thoracic spine: Secondary | ICD-10-CM | POA: Diagnosis not present

## 2023-11-14 DIAGNOSIS — G894 Chronic pain syndrome: Secondary | ICD-10-CM | POA: Diagnosis not present

## 2023-11-14 DIAGNOSIS — G309 Alzheimer's disease, unspecified: Secondary | ICD-10-CM | POA: Diagnosis not present

## 2023-11-14 DIAGNOSIS — F02C2 Dementia in other diseases classified elsewhere, severe, with psychotic disturbance: Secondary | ICD-10-CM | POA: Diagnosis not present

## 2023-11-14 DIAGNOSIS — N390 Urinary tract infection, site not specified: Secondary | ICD-10-CM | POA: Diagnosis not present

## 2023-11-14 DIAGNOSIS — M47816 Spondylosis without myelopathy or radiculopathy, lumbar region: Secondary | ICD-10-CM | POA: Diagnosis not present

## 2023-11-15 DIAGNOSIS — R410 Disorientation, unspecified: Secondary | ICD-10-CM | POA: Diagnosis not present

## 2023-11-15 DIAGNOSIS — R2681 Unsteadiness on feet: Secondary | ICD-10-CM | POA: Diagnosis not present

## 2023-11-15 DIAGNOSIS — M6259 Muscle wasting and atrophy, not elsewhere classified, multiple sites: Secondary | ICD-10-CM | POA: Diagnosis not present

## 2023-11-15 DIAGNOSIS — M6281 Muscle weakness (generalized): Secondary | ICD-10-CM | POA: Diagnosis not present

## 2023-11-15 DIAGNOSIS — R262 Difficulty in walking, not elsewhere classified: Secondary | ICD-10-CM | POA: Diagnosis not present

## 2023-11-16 DIAGNOSIS — M47816 Spondylosis without myelopathy or radiculopathy, lumbar region: Secondary | ICD-10-CM | POA: Diagnosis not present

## 2023-11-16 DIAGNOSIS — G309 Alzheimer's disease, unspecified: Secondary | ICD-10-CM | POA: Diagnosis not present

## 2023-11-16 DIAGNOSIS — K219 Gastro-esophageal reflux disease without esophagitis: Secondary | ICD-10-CM | POA: Diagnosis not present

## 2023-11-16 DIAGNOSIS — M546 Pain in thoracic spine: Secondary | ICD-10-CM | POA: Diagnosis not present

## 2023-11-16 DIAGNOSIS — I10 Essential (primary) hypertension: Secondary | ICD-10-CM | POA: Diagnosis not present

## 2023-11-16 DIAGNOSIS — F02C2 Dementia in other diseases classified elsewhere, severe, with psychotic disturbance: Secondary | ICD-10-CM | POA: Diagnosis not present

## 2023-11-20 DIAGNOSIS — R262 Difficulty in walking, not elsewhere classified: Secondary | ICD-10-CM | POA: Diagnosis not present

## 2023-11-20 DIAGNOSIS — R2681 Unsteadiness on feet: Secondary | ICD-10-CM | POA: Diagnosis not present

## 2023-11-20 DIAGNOSIS — M6281 Muscle weakness (generalized): Secondary | ICD-10-CM | POA: Diagnosis not present

## 2023-11-20 DIAGNOSIS — G309 Alzheimer's disease, unspecified: Secondary | ICD-10-CM | POA: Diagnosis not present

## 2023-11-20 DIAGNOSIS — R531 Weakness: Secondary | ICD-10-CM | POA: Diagnosis not present

## 2023-11-20 DIAGNOSIS — M6259 Muscle wasting and atrophy, not elsewhere classified, multiple sites: Secondary | ICD-10-CM | POA: Diagnosis not present

## 2023-11-20 DIAGNOSIS — F0390 Unspecified dementia without behavioral disturbance: Secondary | ICD-10-CM | POA: Diagnosis not present

## 2023-11-22 DIAGNOSIS — F0390 Unspecified dementia without behavioral disturbance: Secondary | ICD-10-CM | POA: Diagnosis not present

## 2023-11-22 DIAGNOSIS — F32A Depression, unspecified: Secondary | ICD-10-CM | POA: Diagnosis not present

## 2023-11-22 DIAGNOSIS — N390 Urinary tract infection, site not specified: Secondary | ICD-10-CM | POA: Diagnosis not present

## 2023-11-22 DIAGNOSIS — M159 Polyosteoarthritis, unspecified: Secondary | ICD-10-CM | POA: Diagnosis not present

## 2023-11-22 DIAGNOSIS — R531 Weakness: Secondary | ICD-10-CM | POA: Diagnosis not present

## 2023-11-22 DIAGNOSIS — R2681 Unsteadiness on feet: Secondary | ICD-10-CM | POA: Diagnosis not present

## 2023-11-22 DIAGNOSIS — I10 Essential (primary) hypertension: Secondary | ICD-10-CM | POA: Diagnosis not present

## 2023-11-22 DIAGNOSIS — E785 Hyperlipidemia, unspecified: Secondary | ICD-10-CM | POA: Diagnosis not present

## 2023-11-22 DIAGNOSIS — G311 Senile degeneration of brain, not elsewhere classified: Secondary | ICD-10-CM | POA: Diagnosis not present

## 2023-11-22 DIAGNOSIS — R296 Repeated falls: Secondary | ICD-10-CM | POA: Diagnosis not present

## 2023-11-22 DIAGNOSIS — K2100 Gastro-esophageal reflux disease with esophagitis, without bleeding: Secondary | ICD-10-CM | POA: Diagnosis not present

## 2023-11-22 DIAGNOSIS — R1310 Dysphagia, unspecified: Secondary | ICD-10-CM | POA: Diagnosis not present

## 2023-11-22 DIAGNOSIS — E039 Hypothyroidism, unspecified: Secondary | ICD-10-CM | POA: Diagnosis not present

## 2023-11-23 DIAGNOSIS — M159 Polyosteoarthritis, unspecified: Secondary | ICD-10-CM | POA: Diagnosis not present

## 2023-11-23 DIAGNOSIS — G311 Senile degeneration of brain, not elsewhere classified: Secondary | ICD-10-CM | POA: Diagnosis not present

## 2023-11-23 DIAGNOSIS — R296 Repeated falls: Secondary | ICD-10-CM | POA: Diagnosis not present

## 2023-11-23 DIAGNOSIS — I10 Essential (primary) hypertension: Secondary | ICD-10-CM | POA: Diagnosis not present

## 2023-11-23 DIAGNOSIS — R1310 Dysphagia, unspecified: Secondary | ICD-10-CM | POA: Diagnosis not present

## 2023-11-23 DIAGNOSIS — K2100 Gastro-esophageal reflux disease with esophagitis, without bleeding: Secondary | ICD-10-CM | POA: Diagnosis not present

## 2023-11-24 DIAGNOSIS — G311 Senile degeneration of brain, not elsewhere classified: Secondary | ICD-10-CM | POA: Diagnosis not present

## 2023-11-24 DIAGNOSIS — M159 Polyosteoarthritis, unspecified: Secondary | ICD-10-CM | POA: Diagnosis not present

## 2023-11-24 DIAGNOSIS — I10 Essential (primary) hypertension: Secondary | ICD-10-CM | POA: Diagnosis not present

## 2023-11-24 DIAGNOSIS — R1310 Dysphagia, unspecified: Secondary | ICD-10-CM | POA: Diagnosis not present

## 2023-11-24 DIAGNOSIS — R296 Repeated falls: Secondary | ICD-10-CM | POA: Diagnosis not present

## 2023-11-24 DIAGNOSIS — K2100 Gastro-esophageal reflux disease with esophagitis, without bleeding: Secondary | ICD-10-CM | POA: Diagnosis not present

## 2023-11-25 DIAGNOSIS — K2100 Gastro-esophageal reflux disease with esophagitis, without bleeding: Secondary | ICD-10-CM | POA: Diagnosis not present

## 2023-11-25 DIAGNOSIS — M159 Polyosteoarthritis, unspecified: Secondary | ICD-10-CM | POA: Diagnosis not present

## 2023-11-25 DIAGNOSIS — I10 Essential (primary) hypertension: Secondary | ICD-10-CM | POA: Diagnosis not present

## 2023-11-25 DIAGNOSIS — G311 Senile degeneration of brain, not elsewhere classified: Secondary | ICD-10-CM | POA: Diagnosis not present

## 2023-11-25 DIAGNOSIS — R296 Repeated falls: Secondary | ICD-10-CM | POA: Diagnosis not present

## 2023-11-25 DIAGNOSIS — R1310 Dysphagia, unspecified: Secondary | ICD-10-CM | POA: Diagnosis not present

## 2023-11-27 ENCOUNTER — Ambulatory Visit: Payer: Medicare Other | Admitting: Family Medicine

## 2023-11-27 DIAGNOSIS — K2100 Gastro-esophageal reflux disease with esophagitis, without bleeding: Secondary | ICD-10-CM | POA: Diagnosis not present

## 2023-11-27 DIAGNOSIS — R1310 Dysphagia, unspecified: Secondary | ICD-10-CM | POA: Diagnosis not present

## 2023-11-27 DIAGNOSIS — G311 Senile degeneration of brain, not elsewhere classified: Secondary | ICD-10-CM | POA: Diagnosis not present

## 2023-11-27 DIAGNOSIS — I10 Essential (primary) hypertension: Secondary | ICD-10-CM | POA: Diagnosis not present

## 2023-11-27 DIAGNOSIS — M159 Polyosteoarthritis, unspecified: Secondary | ICD-10-CM | POA: Diagnosis not present

## 2023-11-27 DIAGNOSIS — R296 Repeated falls: Secondary | ICD-10-CM | POA: Diagnosis not present

## 2023-11-28 DIAGNOSIS — R296 Repeated falls: Secondary | ICD-10-CM | POA: Diagnosis not present

## 2023-11-28 DIAGNOSIS — G311 Senile degeneration of brain, not elsewhere classified: Secondary | ICD-10-CM | POA: Diagnosis not present

## 2023-11-28 DIAGNOSIS — I10 Essential (primary) hypertension: Secondary | ICD-10-CM | POA: Diagnosis not present

## 2023-11-28 DIAGNOSIS — R1310 Dysphagia, unspecified: Secondary | ICD-10-CM | POA: Diagnosis not present

## 2023-11-28 DIAGNOSIS — K2100 Gastro-esophageal reflux disease with esophagitis, without bleeding: Secondary | ICD-10-CM | POA: Diagnosis not present

## 2023-11-28 DIAGNOSIS — M159 Polyosteoarthritis, unspecified: Secondary | ICD-10-CM | POA: Diagnosis not present

## 2023-11-28 DIAGNOSIS — F32A Depression, unspecified: Secondary | ICD-10-CM | POA: Diagnosis not present

## 2023-11-28 DIAGNOSIS — E039 Hypothyroidism, unspecified: Secondary | ICD-10-CM | POA: Diagnosis not present

## 2023-11-28 DIAGNOSIS — N390 Urinary tract infection, site not specified: Secondary | ICD-10-CM | POA: Diagnosis not present

## 2023-11-28 DIAGNOSIS — E785 Hyperlipidemia, unspecified: Secondary | ICD-10-CM | POA: Diagnosis not present

## 2023-11-28 DIAGNOSIS — F0390 Unspecified dementia without behavioral disturbance: Secondary | ICD-10-CM | POA: Diagnosis not present

## 2023-11-29 DIAGNOSIS — K2100 Gastro-esophageal reflux disease with esophagitis, without bleeding: Secondary | ICD-10-CM | POA: Diagnosis not present

## 2023-11-29 DIAGNOSIS — G311 Senile degeneration of brain, not elsewhere classified: Secondary | ICD-10-CM | POA: Diagnosis not present

## 2023-11-29 DIAGNOSIS — R1310 Dysphagia, unspecified: Secondary | ICD-10-CM | POA: Diagnosis not present

## 2023-11-29 DIAGNOSIS — R296 Repeated falls: Secondary | ICD-10-CM | POA: Diagnosis not present

## 2023-11-29 DIAGNOSIS — M159 Polyosteoarthritis, unspecified: Secondary | ICD-10-CM | POA: Diagnosis not present

## 2023-11-29 DIAGNOSIS — I10 Essential (primary) hypertension: Secondary | ICD-10-CM | POA: Diagnosis not present

## 2023-11-30 ENCOUNTER — Ambulatory Visit: Payer: Medicare Other | Admitting: Family Medicine

## 2023-11-30 DIAGNOSIS — G311 Senile degeneration of brain, not elsewhere classified: Secondary | ICD-10-CM | POA: Diagnosis not present

## 2023-11-30 DIAGNOSIS — G629 Polyneuropathy, unspecified: Secondary | ICD-10-CM | POA: Diagnosis not present

## 2023-11-30 DIAGNOSIS — M546 Pain in thoracic spine: Secondary | ICD-10-CM | POA: Diagnosis not present

## 2023-11-30 DIAGNOSIS — I10 Essential (primary) hypertension: Secondary | ICD-10-CM | POA: Diagnosis not present

## 2023-11-30 DIAGNOSIS — M159 Polyosteoarthritis, unspecified: Secondary | ICD-10-CM | POA: Diagnosis not present

## 2023-11-30 DIAGNOSIS — R296 Repeated falls: Secondary | ICD-10-CM | POA: Diagnosis not present

## 2023-11-30 DIAGNOSIS — K2100 Gastro-esophageal reflux disease with esophagitis, without bleeding: Secondary | ICD-10-CM | POA: Diagnosis not present

## 2023-11-30 DIAGNOSIS — R1310 Dysphagia, unspecified: Secondary | ICD-10-CM | POA: Diagnosis not present

## 2023-11-30 DIAGNOSIS — G894 Chronic pain syndrome: Secondary | ICD-10-CM | POA: Diagnosis not present

## 2023-11-30 DIAGNOSIS — Z515 Encounter for palliative care: Secondary | ICD-10-CM | POA: Diagnosis not present

## 2023-12-01 DIAGNOSIS — K2100 Gastro-esophageal reflux disease with esophagitis, without bleeding: Secondary | ICD-10-CM | POA: Diagnosis not present

## 2023-12-01 DIAGNOSIS — R296 Repeated falls: Secondary | ICD-10-CM | POA: Diagnosis not present

## 2023-12-01 DIAGNOSIS — G311 Senile degeneration of brain, not elsewhere classified: Secondary | ICD-10-CM | POA: Diagnosis not present

## 2023-12-01 DIAGNOSIS — I10 Essential (primary) hypertension: Secondary | ICD-10-CM | POA: Diagnosis not present

## 2023-12-01 DIAGNOSIS — R1310 Dysphagia, unspecified: Secondary | ICD-10-CM | POA: Diagnosis not present

## 2023-12-01 DIAGNOSIS — M159 Polyosteoarthritis, unspecified: Secondary | ICD-10-CM | POA: Diagnosis not present

## 2023-12-02 DIAGNOSIS — M159 Polyosteoarthritis, unspecified: Secondary | ICD-10-CM | POA: Diagnosis not present

## 2023-12-02 DIAGNOSIS — K2100 Gastro-esophageal reflux disease with esophagitis, without bleeding: Secondary | ICD-10-CM | POA: Diagnosis not present

## 2023-12-02 DIAGNOSIS — R296 Repeated falls: Secondary | ICD-10-CM | POA: Diagnosis not present

## 2023-12-02 DIAGNOSIS — R1310 Dysphagia, unspecified: Secondary | ICD-10-CM | POA: Diagnosis not present

## 2023-12-02 DIAGNOSIS — G311 Senile degeneration of brain, not elsewhere classified: Secondary | ICD-10-CM | POA: Diagnosis not present

## 2023-12-02 DIAGNOSIS — I10 Essential (primary) hypertension: Secondary | ICD-10-CM | POA: Diagnosis not present

## 2023-12-03 DIAGNOSIS — K2100 Gastro-esophageal reflux disease with esophagitis, without bleeding: Secondary | ICD-10-CM | POA: Diagnosis not present

## 2023-12-03 DIAGNOSIS — R1310 Dysphagia, unspecified: Secondary | ICD-10-CM | POA: Diagnosis not present

## 2023-12-03 DIAGNOSIS — M159 Polyosteoarthritis, unspecified: Secondary | ICD-10-CM | POA: Diagnosis not present

## 2023-12-03 DIAGNOSIS — R296 Repeated falls: Secondary | ICD-10-CM | POA: Diagnosis not present

## 2023-12-03 DIAGNOSIS — G311 Senile degeneration of brain, not elsewhere classified: Secondary | ICD-10-CM | POA: Diagnosis not present

## 2023-12-03 DIAGNOSIS — I10 Essential (primary) hypertension: Secondary | ICD-10-CM | POA: Diagnosis not present

## 2023-12-04 DIAGNOSIS — K2100 Gastro-esophageal reflux disease with esophagitis, without bleeding: Secondary | ICD-10-CM | POA: Diagnosis not present

## 2023-12-04 DIAGNOSIS — I10 Essential (primary) hypertension: Secondary | ICD-10-CM | POA: Diagnosis not present

## 2023-12-04 DIAGNOSIS — M159 Polyosteoarthritis, unspecified: Secondary | ICD-10-CM | POA: Diagnosis not present

## 2023-12-04 DIAGNOSIS — G311 Senile degeneration of brain, not elsewhere classified: Secondary | ICD-10-CM | POA: Diagnosis not present

## 2023-12-04 DIAGNOSIS — R1310 Dysphagia, unspecified: Secondary | ICD-10-CM | POA: Diagnosis not present

## 2023-12-04 DIAGNOSIS — R296 Repeated falls: Secondary | ICD-10-CM | POA: Diagnosis not present

## 2023-12-05 DIAGNOSIS — R1310 Dysphagia, unspecified: Secondary | ICD-10-CM | POA: Diagnosis not present

## 2023-12-05 DIAGNOSIS — K2100 Gastro-esophageal reflux disease with esophagitis, without bleeding: Secondary | ICD-10-CM | POA: Diagnosis not present

## 2023-12-05 DIAGNOSIS — I10 Essential (primary) hypertension: Secondary | ICD-10-CM | POA: Diagnosis not present

## 2023-12-05 DIAGNOSIS — G311 Senile degeneration of brain, not elsewhere classified: Secondary | ICD-10-CM | POA: Diagnosis not present

## 2023-12-05 DIAGNOSIS — R296 Repeated falls: Secondary | ICD-10-CM | POA: Diagnosis not present

## 2023-12-05 DIAGNOSIS — M159 Polyosteoarthritis, unspecified: Secondary | ICD-10-CM | POA: Diagnosis not present

## 2023-12-06 DIAGNOSIS — K2100 Gastro-esophageal reflux disease with esophagitis, without bleeding: Secondary | ICD-10-CM | POA: Diagnosis not present

## 2023-12-06 DIAGNOSIS — R296 Repeated falls: Secondary | ICD-10-CM | POA: Diagnosis not present

## 2023-12-06 DIAGNOSIS — M159 Polyosteoarthritis, unspecified: Secondary | ICD-10-CM | POA: Diagnosis not present

## 2023-12-06 DIAGNOSIS — I10 Essential (primary) hypertension: Secondary | ICD-10-CM | POA: Diagnosis not present

## 2023-12-06 DIAGNOSIS — R1310 Dysphagia, unspecified: Secondary | ICD-10-CM | POA: Diagnosis not present

## 2023-12-06 DIAGNOSIS — G311 Senile degeneration of brain, not elsewhere classified: Secondary | ICD-10-CM | POA: Diagnosis not present

## 2023-12-07 DIAGNOSIS — K2100 Gastro-esophageal reflux disease with esophagitis, without bleeding: Secondary | ICD-10-CM | POA: Diagnosis not present

## 2023-12-07 DIAGNOSIS — G311 Senile degeneration of brain, not elsewhere classified: Secondary | ICD-10-CM | POA: Diagnosis not present

## 2023-12-07 DIAGNOSIS — I10 Essential (primary) hypertension: Secondary | ICD-10-CM | POA: Diagnosis not present

## 2023-12-07 DIAGNOSIS — R1310 Dysphagia, unspecified: Secondary | ICD-10-CM | POA: Diagnosis not present

## 2023-12-07 DIAGNOSIS — R296 Repeated falls: Secondary | ICD-10-CM | POA: Diagnosis not present

## 2023-12-07 DIAGNOSIS — M159 Polyosteoarthritis, unspecified: Secondary | ICD-10-CM | POA: Diagnosis not present

## 2023-12-08 DIAGNOSIS — R296 Repeated falls: Secondary | ICD-10-CM | POA: Diagnosis not present

## 2023-12-08 DIAGNOSIS — G309 Alzheimer's disease, unspecified: Secondary | ICD-10-CM | POA: Diagnosis not present

## 2023-12-08 DIAGNOSIS — G311 Senile degeneration of brain, not elsewhere classified: Secondary | ICD-10-CM | POA: Diagnosis not present

## 2023-12-08 DIAGNOSIS — M159 Polyosteoarthritis, unspecified: Secondary | ICD-10-CM | POA: Diagnosis not present

## 2023-12-08 DIAGNOSIS — F419 Anxiety disorder, unspecified: Secondary | ICD-10-CM | POA: Diagnosis not present

## 2023-12-08 DIAGNOSIS — K2100 Gastro-esophageal reflux disease with esophagitis, without bleeding: Secondary | ICD-10-CM | POA: Diagnosis not present

## 2023-12-08 DIAGNOSIS — R1310 Dysphagia, unspecified: Secondary | ICD-10-CM | POA: Diagnosis not present

## 2023-12-08 DIAGNOSIS — I10 Essential (primary) hypertension: Secondary | ICD-10-CM | POA: Diagnosis not present

## 2023-12-08 DIAGNOSIS — F02C2 Dementia in other diseases classified elsewhere, severe, with psychotic disturbance: Secondary | ICD-10-CM | POA: Diagnosis not present

## 2023-12-09 DIAGNOSIS — M159 Polyosteoarthritis, unspecified: Secondary | ICD-10-CM | POA: Diagnosis not present

## 2023-12-09 DIAGNOSIS — G311 Senile degeneration of brain, not elsewhere classified: Secondary | ICD-10-CM | POA: Diagnosis not present

## 2023-12-09 DIAGNOSIS — K2100 Gastro-esophageal reflux disease with esophagitis, without bleeding: Secondary | ICD-10-CM | POA: Diagnosis not present

## 2023-12-09 DIAGNOSIS — R1310 Dysphagia, unspecified: Secondary | ICD-10-CM | POA: Diagnosis not present

## 2023-12-09 DIAGNOSIS — I10 Essential (primary) hypertension: Secondary | ICD-10-CM | POA: Diagnosis not present

## 2023-12-09 DIAGNOSIS — R296 Repeated falls: Secondary | ICD-10-CM | POA: Diagnosis not present

## 2023-12-12 DIAGNOSIS — F0390 Unspecified dementia without behavioral disturbance: Secondary | ICD-10-CM | POA: Diagnosis not present

## 2023-12-12 DIAGNOSIS — G309 Alzheimer's disease, unspecified: Secondary | ICD-10-CM | POA: Diagnosis not present

## 2023-12-29 DEATH — deceased
